# Patient Record
Sex: Female | Born: 1940 | Race: White | Hispanic: No | Marital: Married | Smoking: Former smoker
Health system: Southern US, Community
[De-identification: ages and names within clinical notes are randomized; demographics above are authoritative.]

## PROBLEM LIST (undated history)

## (undated) DIAGNOSIS — C801 Malignant (primary) neoplasm, unspecified: Secondary | ICD-10-CM

## (undated) DIAGNOSIS — J449 Chronic obstructive pulmonary disease, unspecified: Secondary | ICD-10-CM

## (undated) DIAGNOSIS — F32A Depression, unspecified: Secondary | ICD-10-CM

## (undated) DIAGNOSIS — I2510 Atherosclerotic heart disease of native coronary artery without angina pectoris: Secondary | ICD-10-CM

## (undated) DIAGNOSIS — K5790 Diverticulosis of intestine, part unspecified, without perforation or abscess without bleeding: Secondary | ICD-10-CM

## (undated) DIAGNOSIS — K219 Gastro-esophageal reflux disease without esophagitis: Secondary | ICD-10-CM

## (undated) DIAGNOSIS — F419 Anxiety disorder, unspecified: Secondary | ICD-10-CM

## (undated) DIAGNOSIS — F329 Major depressive disorder, single episode, unspecified: Secondary | ICD-10-CM

## (undated) DIAGNOSIS — K2270 Barrett's esophagus without dysplasia: Secondary | ICD-10-CM

## (undated) DIAGNOSIS — Z8489 Family history of other specified conditions: Secondary | ICD-10-CM

## (undated) DIAGNOSIS — N179 Acute kidney failure, unspecified: Secondary | ICD-10-CM

## (undated) DIAGNOSIS — I10 Essential (primary) hypertension: Secondary | ICD-10-CM

## (undated) DIAGNOSIS — N816 Rectocele: Secondary | ICD-10-CM

## (undated) DIAGNOSIS — E785 Hyperlipidemia, unspecified: Secondary | ICD-10-CM

## (undated) HISTORY — DX: Diverticulosis of intestine, part unspecified, without perforation or abscess without bleeding: K57.90

## (undated) HISTORY — PX: CATARACT EXTRACTION W/ INTRAOCULAR LENS  IMPLANT, BILATERAL: SHX1307

## (undated) HISTORY — PX: FLEXIBLE SIGMOIDOSCOPY: SHX5431

## (undated) HISTORY — PX: PARTIAL HYSTERECTOMY: SHX80

## (undated) HISTORY — PX: ABDOMINAL HYSTERECTOMY: SHX81

## (undated) HISTORY — DX: Barrett's esophagus without dysplasia: K22.70

## (undated) HISTORY — PX: CARDIAC CATHETERIZATION: SHX172

## (undated) HISTORY — DX: Rectocele: N81.6

## (undated) HISTORY — DX: Anxiety disorder, unspecified: F41.9

## (undated) HISTORY — DX: Hyperlipidemia, unspecified: E78.5

---

## 2007-11-25 ENCOUNTER — Ambulatory Visit: Payer: Self-pay | Admitting: Internal Medicine

## 2008-01-20 HISTORY — PX: ESOPHAGOGASTRODUODENOSCOPY: SHX1529

## 2008-01-20 HISTORY — PX: COLONOSCOPY: SHX174

## 2008-01-30 ENCOUNTER — Ambulatory Visit: Payer: Self-pay | Admitting: Gastroenterology

## 2008-01-30 ENCOUNTER — Ambulatory Visit (HOSPITAL_COMMUNITY): Admission: RE | Admit: 2008-01-30 | Discharge: 2008-01-30 | Payer: Self-pay | Admitting: Gastroenterology

## 2008-01-30 ENCOUNTER — Encounter: Payer: Self-pay | Admitting: Gastroenterology

## 2010-06-03 NOTE — Op Note (Signed)
Rhonda Hughes, Rhonda Hughes               ACCOUNT NO.:  [id number]   MEDICAL RECORD NO.:  [id number]          PATIENT TYPE:  AMB   LOCATION:  DAY                           FACILITY:  [facility]   PHYSICIAN:  Kassie Mends, M.D.      DATE OF BIRTH:  [birth year]   DATE OF PROCEDURE:  DATE OF DISCHARGE:                               OPERATIVE REPORT   REFERRING PHYSICIAN:  Mellody Dance A. Mayford Knife, MD, fax number [phone].   PROCEDURE:  1. Colonoscopy.  2. Esophagogastroduodenoscopy with cold forceps biopsy of the      esophagus and the gastric mucosa.   INDICATION FOR EXAM:  Rhonda Hughes is a 70 year old female who has history  of diverticulosis and presents for screening.  She also had a biopsy and  upper endoscopy which showed Barrett esophagus.   FINDINGS:  1. Multiple diverticula seen in the descending and sigmoid colon.      Otherwise, no polyps, masses, inflammatory changes, or AVMs seen.  2. Irregular Z-line with a few salmon-colored tongues that extends      from the Z-line.  The tongues were less than 1 cm.  Biopsies were      obtained.  3. Diffuse erythema in the body and the antrum with occasional erosion      showing active oozing.  Biopsies obtained via cold forceps to      evaluate for H. pylori gastritis.  4. Normal duodenal bulb and second portion of the duodenum.   DIAGNOSES:  1. Short-segment Barrett.  2. Moderate gastritis.  3. Moderate diverticulosis.   RECOMMENDATIONS:  1. Will call Rhonda Hughes with the results of her biopsies.  2. She has short-segment Barrett, and would recommend EGD for      surveillance in 5 years.  3. Screening colonoscopy in 10 years.  4. No aspirin or NSAIDs for 30 days.  No anticoagulation for 5 days.  5. She was given a handout on diverticulosis and a high-fiber diet as      well as gastritis.  6. Add Prilosec 20 mg daily.   MEDICATIONS:  1. Demerol 75 mg IV.  2. Versed 4 mg IV.  3. Phenergan 12.5 mg IV.   PROCEDURE TECHNIQUE:  Physical exam  was performed.  Informed consent was  obtained from the patient after explaining the benefits, risks, and  alternatives to the procedure.  The patient was connected to the monitor  and placed in the left lateral position.  Continuous oxygen was provided  by nasal cannula.  IV medicine administered through an indwelling  cannula.  After administration of sedation and rectal exam, the  patient's rectum was intubated, and the scope was advanced under direct  visualization to the cecum.  The scope was removed slowly by carefully  examining the color, texture, anatomy, and integrity of the mucosa on  the way out.  The patient was recovered in endoscopy and discharged home  after her upper endoscopy with complete.  After the colonoscopy, the  patient's esophagus was intubated with the diagnostic gastroscope, and  the scope was advanced under direct visualization to the second portion  of the duodenum.  The scope was removed slowly by carefully examining  the color, texture, anatomy, and integrity of the mucosa on the way out.  The patient was recovered in endoscopy and discharged home in  satisfactory condition.   PATH:  Minimal gastric inflammation. Barrett's esophaus.      Kassie Mends, M.D.  Electronically Signed     SM/MEDQ  D:  01/30/2008  T:  01/30/2008  Job:  [id number]   cc:   Mellody Dance A. Mayford Knife, MD

## 2010-06-06 NOTE — H&P (Signed)
NAME:  Rhonda Hughes, Rhonda Hughes               ACCOUNT NO.:  [id number]   MEDICAL RECORD NO.:  [id number]          PATIENT TYPE:  AMB   LOCATION:  DAY                           FACILITY:  [facility]   PHYSICIAN:  R. Roetta Sessions, M.D. DATE OF BIRTH:  November 27, 1940   DATE OF ADMISSION:  DATE OF DISCHARGE:  LH                              HISTORY & PHYSICAL   PRIMARY CARE PHYSICIAN:  Dr. Rance Muir in [place], [place].   CHIEF COMPLAINT:  Diverticulitis.   HISTORY OF PRESENT ILLNESS:  Rhonda Hughes is a 70 year old Caucasian  female.  Rhonda Hughes was diagnosed with diverticulitis about 2-1/2 to 3 months  ago.  Rhonda Hughes felt like Rhonda Hughes had a pulled muscle.  Rhonda Hughes was having lower  abdominal pain.  Rhonda Hughes was seen by her primary care physician and treated  with Cipro and Flagyl.  Rhonda Hughes tells me Rhonda Hughes took it for about 8 days and  became sick.  Pain was 10/10 at worst.  It seemed to clear up.  Rhonda Hughes then  again had a second flare around November 05, 2007 and Rhonda Hughes was restarted  on a 10-day course of Cipro and Flagyl again.  At that time Rhonda Hughes did have  severe weakness and lower bilateral quadrant pain.  Rhonda Hughes had nausea and  Rhonda Hughes had some diarrhea with about 6 to 8 loose stools per day.  Rhonda Hughes  denies any rectal bleeding, melena or mucus in her stools.  Denies any  fever or chills.  Rhonda Hughes does have occasional heartburn a couple times a  week.  Rhonda Hughes has started acidophilus for her diarrhea which does seem to  help.  At this point Rhonda Hughes denies any abdominal pain and her diarrhea has  checked up.   PAST MEDICAL AND SURGICAL HISTORY:  Hypercholesterolemia, hypertension,  anxiety.  Rhonda Hughes tells me Rhonda Hughes had a colonoscopy approximately 7 or more  years ago which Rhonda Hughes thinks was normal except for diverticulosis.  Rhonda Hughes  has had a partial hysterectomy.   CURRENT MEDICATIONS:  1. Lisinopril/hydrochlorothiazide 20/12.5 mg daily.  2. Venlafaxine 37.5 mg daily.  3. Alprazolam 0.25 mg p.r.n.  4. Simvastatin 40 mg daily.  5. Klor-Con 20 mEq daily.  6. Calcium 200  mg daily.  7. Vitamin D 2000 IU daily.  8. Multivitamin daily.  9. Acidophilus.   ALLERGIES:  SULFA.   FAMILY HISTORY:  Positive for her mother with a perforated colon of  unknown etiology.  Rhonda Hughes died at age 64.  Father deceased at 72 secondary  to coronary disease.  Brother and sister who are alive and healthy.  There is no known family history of colorectal carcinoma.   SOCIAL HISTORY:  Rhonda Hughes has been married for 4 years.  Rhonda Hughes has 1  healthy son.  Rhonda Hughes is employed with [employer] in [place] full  time.  Rhonda Hughes has a 58 pack-year history of tobacco use, quit smoking 8  months ago.  Denies any alcohol or drug use.   REVIEW OF SYSTEMS:  See HPI, otherwise negative.   PHYSICAL EXAMINATION:  VITAL SIGNS:  Weight 126 pounds, height 62  inches, temperature 98 degrees,  blood pressure 120/80, pulse 60.  GENERAL:  Rhonda Hughes is a well-developed, well-nourished Caucasian female in no  acute distress.  who is awake and alert, oriented, pleasant, and cooperative, in no acute  distress.  HEENT:  Pupils equal, round, and reactive to light.  Sclerae clear,  nonicteric.  Conjunctivae pink.  Oropharynx pink and moist without  lesions.  NECK:  Supple without evidence of mass or thyromegaly.  CHEST:  Heart regular rate and rhythm.  Normal S1, S2 without murmurs,  clicks, rubs, or gallops.  LUNGS:  Clear to auscultation bilaterally.  ABDOMEN:  Positive bowel sounds x4.  No bruits auscultated.  Soft.  Nontender, nondistended without palpable mass or hepatosplenomegaly.  No  rebound tenderness or guarding.  Rhonda Hughes has mild right upper quadrant,  right mid, and right lower quadrant abdominal tenderness.  EXTREMITIES:  Without clubbing or edema bilaterally.   LABORATORY STUDIES:  From [facility] November 07, 2007  white blood cell count was 11.4, hemoglobin 12.8, hematocrit 36.7,  platelets 306,000.   IMPRESSION:  Rhonda Hughes is a 70 year old Caucasian female with 2  episodes of  diverticulitis in the last 3 months.  Last colonoscopy was  over 7 years ago and, therefore, I do feel it would be appropriate to  proceed with colonoscopy to rule out occult malignancy.  Her  diverticulitis has not been confirmed with imaging either.  However at  this point Rhonda Hughes appears significantly and improved after her second  course of Cipro and Flagyl.   PLAN:  1. CBC, met7, and LFTs.  2. Begin Align probiotic therapy.  3. Would suggest complete ileal colonoscopy with Dr. Jena Gauss in the near      future in approximately 6 weeks to allow her diverticulitis to      resolve.  I discussed procedure including risks and benefits,      including but not limited to infection, perforation, drug reaction.      Consent will be obtained.  4. If Rhonda Hughes experiences any abdominal pain, nausea, vomiting, fever or      change in her bowel habits prior to a colonoscopy, Rhonda Hughes is going to      give [place] a call or go to the emergency room.  If Rhonda Hughes develops      abdominal pain, I suggest CT scan prior to colonoscopy.      Rhonda Hughes, N.P.      Rhonda Hughes, M.D.  Electronically Signed    KJ/MEDQ  D:  11/27/2007  T:  11/27/2007  Job:  [id number]   cc:   Dr. Winfred Burn, [place]

## 2010-06-06 NOTE — Consult Note (Signed)
NAMECAROLLE, ISHII               ACCOUNT NO.:  [id number]   MEDICAL RECORD NO.:  [id number]          PATIENT TYPE:  AMB   LOCATION:  DAY                           FACILITY:  [facility]   PHYSICIAN:  Kassie Mends, M.D.      DATE OF BIRTH:  1940/07/15   DATE OF CONSULTATION:  DATE OF DISCHARGE:                                 CONSULTATION   ADDENDUM   PRIMARY CARE PHYSICIAN:  Dr. Rance Muir in [place], [place].   Ms. Finnicum was initially evaluated by me on 11/25/2007 under the  direction of Dr. Jonathon Bellows.  However, she has stated that she  would like to have colonoscopy done by Dr. Kassie Mends.  On that day Dr.  Cira Servant was out of the office, therefore her future procedure will be  scheduled with her preference of physician.  We will place her under the  direction and care of Dr. Kassie Mends [place] from this point forward.      Lorenza Burton, N.P.      Kassie Mends, M.D.  Electronically Signed    KJ/MEDQ  D:  11/28/2007  T:  11/28/2007  Job:  [id number]

## 2010-06-18 ENCOUNTER — Ambulatory Visit: Payer: Self-pay | Admitting: Gastroenterology

## 2010-06-18 ENCOUNTER — Encounter: Payer: Self-pay | Admitting: Gastroenterology

## 2010-06-18 ENCOUNTER — Ambulatory Visit (INDEPENDENT_AMBULATORY_CARE_PROVIDER_SITE_OTHER): Payer: Medicare Other | Admitting: Gastroenterology

## 2010-06-18 DIAGNOSIS — R198 Other specified symptoms and signs involving the digestive system and abdomen: Secondary | ICD-10-CM

## 2010-06-18 DIAGNOSIS — R194 Change in bowel habit: Secondary | ICD-10-CM

## 2010-06-18 MED ORDER — HYDROCORTISONE ACETATE 25 MG RE SUPP: 25.0000 mg | Freq: Two times a day (BID) | RECTAL | Status: AC

## 2010-06-18 MED ORDER — ALIGN 4 MG PO CAPS: 4.0000 mg | ORAL_CAPSULE | Freq: Every day | ORAL | Status: DC

## 2010-06-18 NOTE — Patient Instructions (Signed)
Take Align for 4-6 weeks. #7 samples provided. Coupon provided. Buy over the counter. Take hydrocortisone suppository twice daily per rectum for 14 days. We will review labs from you PCP when available. Return to office in 6 weeks for follo-up.

## 2010-06-18 NOTE — Assessment & Plan Note (Signed)
Bowel change the last 3 months. Stools are more soft. No blood in the stool or frank diarrhea. She complains of rectal pressure but states that is improved. No alarm symptoms. Last colonoscopy about 2 years ago, diverticulosis. She states she had recent blood work with Dr. Mayford Knife for her physical. Will retrieve copy of blood work for review. Make sure her thyroid was checked. Will add probiotics and Anusol suppositories. She may have internal hemorrhoids. Return to the office in 6 weeks. At this point do not recommend repeat colonoscopy or flexible sigmoidoscopy but if symptoms are ongoing may consider flexible sigmoidoscopy with random biopsies if needed.

## 2010-06-18 NOTE — Progress Notes (Signed)
Cc to Dr. Mayford Knife

## 2010-06-18 NOTE — Progress Notes (Signed)
Primary Care Physician:  Rance Muir, MD  Primary Gastroenterologist:  Jonette Eva, MD  Chief Complaint  Patient presents with  . Bowel changes since feb.    HPI:  Rhonda Hughes is a 70 y.o. female here for further evaluation of change in bowels, rectal pressure which began about 3 months ago. Since February, new horrible odor when wiping after a BM. Some rectal tenesmus/pressure. BM a little more frequent due to decreased amount each time. No more than 2-3 BMs daily. No nocturnal BMs. Stools are soft to mushy. Used to be more solid. No melena, brbpr. No abdominal pain. No heartburn on omeprazole. No dysphagia, n/v, weight loss. At first more gas, but that is better.    Current Outpatient Prescriptions  Medication Sig Dispense Refill  . ALPRAZolam (XANAX) 0.25 MG tablet Take 0.25 mg by mouth at bedtime as needed.        Marland Kitchen atorvastatin (LIPITOR) 10 MG tablet Take 10 mg by mouth daily.        . Biotin 1000 MCG tablet Take 1,000 mcg by mouth 3 (three) times daily.        . Calcium Carbonate-Vitamin D (CALCIUM + D) 600-200 MG-UNIT TABS Take by mouth.        . Cyanocobalamin (VITAMIN B 12 PO) Take 2,000 mg by mouth.        . fish oil-omega-3 fatty acids 1000 MG capsule Take 2 g by mouth daily.        Marland Kitchen lisinopril-hydrochlorothiazide (PRINZIDE,ZESTORETIC) 20-12.5 MG per tablet Take 1 tablet by mouth daily.        . niacin (NIASPAN) 500 MG CR tablet Take 500 mg by mouth at bedtime.        Marland Kitchen omeprazole (PRILOSEC) 20 MG capsule Take 20 mg by mouth daily.        . potassium chloride SA (K-DUR,KLOR-CON) 20 MEQ tablet Take 20 mEq by mouth 2 (two) times daily.        Marland Kitchen venlafaxine (EFFEXOR-XR) 37.5 MG 24 hr capsule Take 37.5 mg by mouth daily.        . hydrocortisone (ANUSOL-HC) 25 MG suppository Place 1 suppository (25 mg total) rectally 2 (two) times daily.  24 suppository  0  . Probiotic Product (ALIGN) 4 MG CAPS Take 4 mg by mouth daily.  7 capsule  0    Allergies as of 06/18/2010 - Review  Complete 06/18/2010  Allergen Reaction Noted  . Sulfa antibiotics  06/18/2010    Past Medical History  Diagnosis Date  . Hyperlipidemia   . HTN (hypertension)   . Anxiety   . Diverticulosis   . Barrett's esophagus without dysplasia     Past Surgical History  Procedure Date  . Partial hysterectomy   . Colonoscopy 01/2008    multiple diverticula in desc and sigm colon  . Esophagogastroduodenoscopy 01/2008    Barrett's esophagus, gastritis, no h.pylori, due follow-up 01/2011    Family History  Problem Relation Age of Onset  . Coronary artery disease Father   . Colon cancer Neg Hx   . GI problems Mother     perforated colon     History   Social History  . Marital Status: Married    Spouse Name: N/A    Number of Children: 1  . Years of Education: N/A   Occupational History  . [employer] in [place]     retired   Social History Main Topics  . Smoking status: Former Smoker -- 1.0 packs/day for 58 years  Types: Cigarettes  . Smokeless tobacco: Former Neurosurgeon    Quit date: 04/16/2007  . Alcohol Use: No  . Drug Use: No  . Sexually Active: Not on file     ROS:  General: Negative for anorexia, weight loss, fever, chills, fatigue, weakness. Eyes: Negative for vision changes.  ENT: Negative for hoarseness, difficulty swallowing , nasal congestion. CV: Negative for chest pain, angina, palpitations, dyspnea on exertion, peripheral edema.  Respiratory: Negative for dyspnea at rest, dyspnea on exertion, cough, sputum, wheezing.  GI: See history of present illness. GU:  Negative for dysuria, hematuria, urinary incontinence, urinary frequency, nocturnal urination.  MS: Negative for joint pain, low back pain.  Derm: Negative for rash or itching.  Neuro: Negative for weakness, abnormal sensation, seizure, frequent headaches, memory loss, confusion.  Psych: Negative for anxiety, depression, suicidal ideation, hallucinations.  Endo: Negative for unusual weight change.  Heme:  Negative for bruising or bleeding. Allergy: Negative for rash or hives.    Physical Examination:  BP 118/64  Pulse 72  Temp(Src) 97.7 F (36.5 C) (Temporal)  Ht 5\' 2"  (1.575 m)  Wt 131 lb 6.4 oz (59.603 kg)  BMI 24.03 kg/m2   General: Well-nourished, well-developed in no acute distress.  Head: Normocephalic, atraumatic.   Eyes: Conjunctiva pink, no icterus. Mouth: Oropharyngeal mucosa moist and pink , no lesions erythema or exudate. Neck: Supple without thyromegaly, masses, or lymphadenopathy.  Lungs: Clear to auscultation bilaterally.  Heart: Regular rate and rhythm, no murmurs rubs or gallops.  Abdomen: Bowel sounds are normal, nontender, nondistended, no hepatosplenomegaly or masses, no abdominal bruits or    hernia , no rebound or guarding.   Rectal exam: Small skin tag at 12 o'clock. No masses in rectal vault. Brown stool heme negative. Nontender.  Extremities: No lower extremity edema.  Neuro: Alert and oriented x 4 , grossly normal neurologically.  Skin: Warm and dry, no rash or jaundice.   Psych: Alert and cooperative, normal mood and affect.

## 2010-06-20 NOTE — Progress Notes (Signed)
Addended by: Tiffany Kocher on: 06/20/2010 12:54 PM   Modules accepted: Level of Service

## 2010-06-20 NOTE — Progress Notes (Signed)
Received labs from Dr. Mayford Knife from March 2012. At that time her white blood cell count was 8000, hemoglobin 12.6, platelets 274,000, creatinine 1.0, albumin 4.2, and bilirubin 0.8, alkaline phosphatase 68, AST 23, ALT 21, no TSH done.  Will have patient have TSH and free T4 drawn for diagnosis of bowel change/diarrhea.

## 2010-06-23 ENCOUNTER — Other Ambulatory Visit: Payer: Self-pay | Admitting: Gastroenterology

## 2010-06-23 ENCOUNTER — Other Ambulatory Visit: Payer: Self-pay

## 2010-06-23 DIAGNOSIS — R194 Change in bowel habit: Secondary | ICD-10-CM

## 2010-06-23 DIAGNOSIS — R197 Diarrhea, unspecified: Secondary | ICD-10-CM

## 2010-06-23 LAB — BASIC METABOLIC PANEL
AST: 23 U/L
Creat: 1

## 2010-06-23 LAB — CBC
HCT: 36 %
Hemoglobin: 12.8 g/dL (ref 12.0–16.0)

## 2010-06-23 NOTE — Progress Notes (Signed)
LMOM for pt to call. 

## 2010-06-23 NOTE — Progress Notes (Signed)
Pt returned call and was informed to go to the lab for lab work.

## 2010-06-23 NOTE — Progress Notes (Signed)
Lab orders faxed to [facility].  

## 2010-06-24 LAB — T4, FREE: Free T4: 0.94 ng/dL (ref 0.80–1.80)

## 2010-07-03 NOTE — Progress Notes (Signed)
Agree w/ plan. Flex sig if Sx persist.

## 2010-07-30 ENCOUNTER — Encounter: Payer: Self-pay | Admitting: Gastroenterology

## 2010-07-30 ENCOUNTER — Ambulatory Visit (INDEPENDENT_AMBULATORY_CARE_PROVIDER_SITE_OTHER): Payer: Medicare Other | Admitting: Gastroenterology

## 2010-07-30 VITALS — BP 113/65 | HR 67 | Temp 97.7°F | Ht 62.0 in | Wt 131.4 lb

## 2010-07-30 DIAGNOSIS — R198 Other specified symptoms and signs involving the digestive system and abdomen: Secondary | ICD-10-CM

## 2010-07-30 NOTE — Assessment & Plan Note (Signed)
Rectal pressure and discomfort associated with change in bowel habits. Stools are incomplete, more frequent, smaller caliber. She is having difficulty with frequent tenesmus as well as difficulty evacuating stool. Recommend flexible sigmoidoscopy with sedation for ongoing symptoms.  I have discussed the risks, alternatives, benefits with regards to but not limited to the risk of reaction to medication, bleeding, infection, perforation and the patient is agreeable to proceed. Written consent to be obtained.

## 2010-07-30 NOTE — Progress Notes (Signed)
Faxed to Dr. Rance Muir

## 2010-07-30 NOTE — Progress Notes (Signed)
Primary Care Physician:  Provider Not In System  Primary Gastroenterologist:  Jonette Eva, MD  Chief Complaint  Patient presents with  . Follow-up    still having same problems    HPI:  Rhonda Hughes is a 70 y.o. female here followup for rectal pressure. Tried probiotics and Anusol after last office visit without relief. Symptoms now persistent for 5-6 months. Since February, new horrible odor when wiping after a BM. Some rectal tenesmus/pressure. BM a little more frequent due to decreased amount each time. No more than 2-3 BMs daily. No nocturnal BMs. Stools are soft to mushy. Used to be more solid. No melena, brbpr. Stools are soft but hard to pass. Every time she goes to urinate she gets rectal pressure and discomfort. No history of cystocele or rectocele to her knowledge. Describes difficulty with complete evacuation. Stool caliber smaller. No abdominal pain. No heartburn on omeprazole. No dysphagia, n/v, weight loss. At first more gas, but that is better.    Current Outpatient Prescriptions  Medication Sig Dispense Refill  . ALPRAZolam (XANAX) 0.25 MG tablet Take 0.25 mg by mouth at bedtime as needed.        Marland Kitchen atorvastatin (LIPITOR) 10 MG tablet Take 10 mg by mouth daily.        . Biotin 1000 MCG tablet Take 1,000 mcg by mouth 3 (three) times daily.        . Calcium Carbonate-Vitamin D (CALCIUM + D) 600-200 MG-UNIT TABS Take by mouth.        . Cyanocobalamin (VITAMIN B 12 PO) Take 2,000 mg by mouth.        . fish oil-omega-3 fatty acids 1000 MG capsule Take 2 g by mouth daily.        Marland Kitchen lisinopril-hydrochlorothiazide (PRINZIDE,ZESTORETIC) 20-12.5 MG per tablet Take 1 tablet by mouth daily.        . niacin (NIASPAN) 500 MG CR tablet Take 500 mg by mouth at bedtime.        Marland Kitchen omeprazole (PRILOSEC) 20 MG capsule Take 20 mg by mouth daily.        . potassium chloride SA (K-DUR,KLOR-CON) 20 MEQ tablet Take 20 mEq by mouth 2 (two) times daily.        . Probiotic Product (ALIGN) 4 MG CAPS Take 4  mg by mouth daily.  7 capsule  0  . venlafaxine (EFFEXOR-XR) 37.5 MG 24 hr capsule Take 37.5 mg by mouth daily.          Allergies as of 07/30/2010 - Review Complete 07/30/2010  Allergen Reaction Noted  . Sulfa antibiotics  06/18/2010    Past Medical History  Diagnosis Date  . Hyperlipidemia   . HTN (hypertension)   . Anxiety   . Diverticulosis   . Barrett's esophagus without dysplasia     Past Surgical History  Procedure Date  . Partial hysterectomy 1980s  . Colonoscopy 01/2008    multiple diverticula in desc and sigm colon  . Esophagogastroduodenoscopy 01/2008    Barrett's esophagus, gastritis, no h.pylori, due follow-up 01/2011    Family History  Problem Relation Age of Onset  . Coronary artery disease Father   . Colon cancer Neg Hx   . GI problems Mother     perforated colon     History   Social History  . Marital Status: Married    Spouse Name: N/A    Number of Children: 1  . Years of Education: N/A   Occupational History  . [employer] in [place]  retired   Social History Main Topics  . Smoking status: Former Smoker -- 1.0 packs/day for 58 years    Types: Cigarettes  . Smokeless tobacco: Former Neurosurgeon    Quit date: 04/16/2007  . Alcohol Use: No  . Drug Use: No  . Sexually Active: Not on file   Other Topics Concern  . Not on file   Social History Narrative  . No narrative on file      ROS:  General: Negative for anorexia, weight loss, fever, chills, fatigue, weakness. Eyes: Negative for vision changes.  ENT: Negative for hoarseness, difficulty swallowing , nasal congestion. CV: Negative for chest pain, angina, palpitations, dyspnea on exertion, peripheral edema.  Respiratory: Negative for dyspnea at rest, dyspnea on exertion, cough, sputum, wheezing.  GI: See history of present illness. GU:  Negative for dysuria, hematuria, urinary incontinence, urinary frequency, nocturnal urination.  MS: Negative for joint pain, low back pain.  Derm:  Negative for rash or itching.  Neuro: Negative for weakness, abnormal sensation, seizure, frequent headaches, memory loss, confusion.  Psych: Negative for anxiety, depression, suicidal ideation, hallucinations.  Endo: Negative for unusual weight change.  Heme: Negative for bruising or bleeding. Allergy: Negative for rash or hives.    Physical Examination:  BP 113/65  Pulse 67  Temp(Src) 97.7 F (36.5 C) (Temporal)  Ht 5\' 2"  (1.575 m)  Wt 131 lb 6.4 oz (59.603 kg)  BMI 24.03 kg/m2   General: Well-nourished, well-developed in no acute distress.  Head: Normocephalic, atraumatic.   Eyes: Conjunctiva pink, no icterus. Mouth: Oropharyngeal mucosa moist and pink , no lesions erythema or exudate. Neck: Supple without thyromegaly, masses, or lymphadenopathy.  Lungs: Clear to auscultation bilaterally.  Heart: Regular rate and rhythm, no murmurs rubs or gallops.  Abdomen: Bowel sounds are normal, nontender, nondistended, no hepatosplenomegaly or masses, no abdominal bruits or    hernia , no rebound or guarding.  Rectal exam: Deferred to time of colonoscopy  Extremities: No lower extremity edema.  Neuro: Alert and oriented x 4 , grossly normal neurologically.  Skin: Warm and dry, no rash or jaundice.   Psych: Alert and cooperative, normal mood and affect.

## 2010-08-08 MED ORDER — SODIUM CHLORIDE 0.45 % IV SOLN
Freq: Once | INTRAVENOUS | Status: AC
  Administered 2010-08-11: 11:00:00 via INTRAVENOUS

## 2010-08-11 ENCOUNTER — Other Ambulatory Visit: Payer: PRIVATE HEALTH INSURANCE | Admitting: Gastroenterology

## 2010-08-11 ENCOUNTER — Ambulatory Visit (HOSPITAL_COMMUNITY)
Admission: RE | Admit: 2010-08-11 | Discharge: 2010-08-11 | Disposition: A | Discharge status: Home / Self Care | Payer: Medicare Other | Source: Ambulatory Visit | Attending: Gastroenterology | Admitting: Gastroenterology

## 2010-08-11 ENCOUNTER — Encounter (HOSPITAL_COMMUNITY): Payer: Self-pay | Admitting: *Deleted

## 2010-08-11 ENCOUNTER — Encounter (HOSPITAL_COMMUNITY)
Admission: RE | Disposition: A | Discharge status: Home / Self Care | Payer: Self-pay | Source: Ambulatory Visit | Attending: Gastroenterology

## 2010-08-11 DIAGNOSIS — K6289 Other specified diseases of anus and rectum: Secondary | ICD-10-CM | POA: Insufficient documentation

## 2010-08-11 DIAGNOSIS — K648 Other hemorrhoids: Secondary | ICD-10-CM

## 2010-08-11 DIAGNOSIS — E785 Hyperlipidemia, unspecified: Secondary | ICD-10-CM | POA: Insufficient documentation

## 2010-08-11 DIAGNOSIS — K5730 Diverticulosis of large intestine without perforation or abscess without bleeding: Secondary | ICD-10-CM

## 2010-08-11 DIAGNOSIS — I10 Essential (primary) hypertension: Secondary | ICD-10-CM | POA: Insufficient documentation

## 2010-08-11 DIAGNOSIS — Z79899 Other long term (current) drug therapy: Secondary | ICD-10-CM | POA: Insufficient documentation

## 2010-08-11 HISTORY — DX: Major depressive disorder, single episode, unspecified: F32.9

## 2010-08-11 HISTORY — DX: Depression, unspecified: F32.A

## 2010-08-11 SURGERY — SIGMOIDOSCOPY, FLEXIBLE
Anesthesia: Moderate Sedation

## 2010-08-11 MED ORDER — HYDROCORTISONE ACETATE 25 MG RE SUPP: 25.0000 mg | Freq: Two times a day (BID) | RECTAL | Status: AC

## 2010-08-11 MED ORDER — MEPERIDINE HCL 100 MG/ML IJ SOLN
INTRAMUSCULAR | Status: AC
  Filled 2010-08-11: qty 2

## 2010-08-11 MED ORDER — PROMETHAZINE HCL 25 MG/ML IJ SOLN
INTRAMUSCULAR | Status: DC | PRN
  Administered 2010-08-11: 12.5 mg via INTRAVENOUS

## 2010-08-11 MED ORDER — MIDAZOLAM HCL 5 MG/5ML IJ SOLN
INTRAMUSCULAR | Status: DC | PRN
  Administered 2010-08-11: 2 mg via INTRAVENOUS

## 2010-08-11 MED ORDER — PROMETHAZINE HCL 25 MG/ML IJ SOLN
INTRAMUSCULAR | Status: AC
  Filled 2010-08-11: qty 1

## 2010-08-11 MED ORDER — MIDAZOLAM HCL 5 MG/5ML IJ SOLN
INTRAMUSCULAR | Status: AC
  Filled 2010-08-11: qty 10

## 2010-08-11 MED ORDER — MEPERIDINE HCL 100 MG/ML IJ SOLN
INTRAMUSCULAR | Status: DC | PRN
  Administered 2010-08-11: 50 mg via INTRAVENOUS

## 2010-08-11 NOTE — H&P (Signed)
Rectal pressure   - Primary    787.99      Reason for Visit     Follow-up    still having same problems        Current Vitals       Recorded User        07/30/2010 10:14 AM  Evalee Mutton, LPN           BP Pulse Temp (Src) Resp Ht Wt    113/65  67  97.7 F (36.5 C) (Temporal)  N/A  5\' 2"  (1.575 m)  59.603 kg (131 lb 6.4 oz)       BMI SpO2 PF LMP    24.03 kg/m2  N/A  N/A  N/A          Progress Notes     Tana Coast, PA  07/30/2010 11:29 AM  Signed Primary Care Physician:  Provider Not In System   Primary Gastroenterologist:  Jonette Eva, MD    Chief Complaint   Patient presents with   .  Follow-up       still having same problems      HPI:  Rhonda Hughes is a 70 y.o. female here followup for rectal pressure. Tried probiotics and Anusol after last office visit without relief. Symptoms now persistent for 5-6 months. Since February, new horrible odor when wiping after a BM. Some rectal tenesmus/pressure. BM a little more frequent due to decreased amount each time. No more than 2-3 BMs daily. No nocturnal BMs. Stools are soft to mushy. Used to be more solid. No melena, brbpr. Stools are soft but hard to pass. Every time she goes to urinate she gets rectal pressure and discomfort. No history of cystocele or rectocele to her knowledge. Describes difficulty with complete evacuation. Stool caliber smaller. No abdominal pain. No heartburn on omeprazole. No dysphagia, n/v, weight loss. At first more gas, but that is better.      Current Outpatient Prescriptions   Medication  Sig  Dispense  Refill   .  ALPRAZolam (XANAX) 0.25 MG tablet  Take 0.25 mg by mouth at bedtime as needed.           Marland Kitchen  atorvastatin (LIPITOR) 10 MG tablet  Take 10 mg by mouth daily.           .  Biotin 1000 MCG tablet  Take 1,000 mcg by mouth 3 (three) times daily.           .  Calcium Carbonate-Vitamin D (CALCIUM + D) 600-200 MG-UNIT TABS  Take by mouth.           .  Cyanocobalamin (VITAMIN B 12 PO)   Take 2,000 mg by mouth.           .  fish oil-omega-3 fatty acids 1000 MG capsule  Take 2 g by mouth daily.           Marland Kitchen  lisinopril-hydrochlorothiazide (PRINZIDE,ZESTORETIC) 20-12.5 MG per tablet  Take 1 tablet by mouth daily.           .  niacin (NIASPAN) 500 MG CR tablet  Take 500 mg by mouth at bedtime.           Marland Kitchen  omeprazole (PRILOSEC) 20 MG capsule  Take 20 mg by mouth daily.           .  potassium chloride SA (K-DUR,KLOR-CON) 20 MEQ tablet  Take 20 mEq by mouth 2 (two) times daily.           Marland Kitchen  Probiotic Product (ALIGN) 4 MG CAPS  Take 4 mg by mouth daily.   7 capsule   0   .  venlafaxine (EFFEXOR-XR) 37.5 MG 24 hr capsule  Take 37.5 mg by mouth daily.               Allergies as of 07/30/2010 - Review Complete 07/30/2010   Allergen  Reaction  Noted   .  Sulfa antibiotics    06/18/2010       Past Medical History   Diagnosis  Date   .  Hyperlipidemia     .  HTN (hypertension)     .  Anxiety     .  Diverticulosis     .  Barrett's esophagus without dysplasia         Past Surgical History   Procedure  Date   .  Partial hysterectomy  1980s   .  Colonoscopy  01/2008       multiple diverticula in desc and sigm colon   .  Esophagogastroduodenoscopy  01/2008       Barrett's esophagus, gastritis, no h.pylori, due follow-up 01/2011       Family History   Problem  Relation  Age of Onset   .  Coronary artery disease  Father     .  Colon cancer  Neg Hx     .  GI problems  Mother         perforated colon        History       Social History   .  Marital Status:  Married       Spouse Name:  N/A       Number of Children:  1   .  Years of Education:  N/A       Occupational History   .  [employer] in [place]         retired       Social History Main Topics   .  Smoking status:  Former Smoker -- 1.0 packs/day for 58 years       Types:  Cigarettes   .  Smokeless tobacco:  Former Neurosurgeon       Quit date:  04/16/2007   .  Alcohol Use:  No   .  Drug Use:  No   .  Sexually  Active:  Not on file       Other Topics  Concern   .  Not on file       Social History Narrative   .  No narrative on file        ROS:   General: Negative for anorexia, weight loss, fever, chills, fatigue, weakness. Eyes: Negative for vision changes.   ENT: Negative for hoarseness, difficulty swallowing , nasal congestion. CV: Negative for chest pain, angina, palpitations, dyspnea on exertion, peripheral edema.   Respiratory: Negative for dyspnea at rest, dyspnea on exertion, cough, sputum, wheezing.   GI: See history of present illness. GU:  Negative for dysuria, hematuria, urinary incontinence, urinary frequency, nocturnal urination.   MS: Negative for joint pain, low back pain.   Derm: Negative for rash or itching.   Neuro: Negative for weakness, abnormal sensation, seizure, frequent headaches, memory loss, confusion.   Psych: Negative for anxiety, depression, suicidal ideation, hallucinations.   Endo: Negative for unusual weight change.   Heme: Negative for bruising or bleeding. Allergy: Negative for rash or hives.     Physical Examination:   BP 113/65  Pulse 67  Temp(Src) 97.7 F (36.5 C) (Temporal)  Ht 5\' 2"  (1.575 m)  Wt 131 lb 6.4 oz (59.603 kg)  BMI 24.03 kg/m2    General: Well-nourished, well-developed in no acute distress.   Head: Normocephalic, atraumatic.    Eyes: Conjunctiva pink, no icterus. Mouth: Oropharyngeal mucosa moist and pink , no lesions erythema or exudate. Neck: Supple without thyromegaly, masses, or lymphadenopathy.   Lungs: Clear to auscultation bilaterally.   Heart: Regular rate and rhythm, no murmurs rubs or gallops.   Abdomen: Bowel sounds are normal, nontender, nondistended, no hepatosplenomegaly or masses, no abdominal bruits or    hernia , no rebound or guarding.   Rectal exam: Deferred to time of colonoscopy   Extremities: No lower extremity edema.   Neuro: Alert and oriented x 4 , grossly normal neurologically.   Skin: Warm  and dry, no rash or jaundice.    Psych: Alert and cooperative, normal mood and affect.          Glendora Score  07/30/2010  2:27 PM  Signed Faxed to Dr. Rance Muir          Rectal pressure - Tana Coast, PA  07/30/2010 11:13 AM  Signed Rectal pressure and discomfort associated with change in bowel habits. Stools are incomplete, more frequent, smaller caliber. She is having difficulty with frequent tenesmus as well as difficulty evacuating stool. Recommend flexible sigmoidoscopy with sedation for ongoing symptoms.  I have discussed the risks, alternatives, benefits with regards to but not limited to the risk of reaction to medication, bleeding, infection, perforation and the patient is agreeable to proceed. Written consent to be obtained.

## 2010-08-11 NOTE — Interval H&P Note (Signed)
History and Physical Interval Note:   08/11/2010   10:56 AM   Tracie Harrier  has presented today for surgery, with the diagnosis of rectal pain and pressure  The various methods of treatment have been discussed with the patient and family. After consideration of risks, benefits and other options for treatment, the patient has consented to  Procedure(s): FLEXIBLE SIGMOIDOSCOPY as a surgical intervention .  I have reviewed the patients' chart and labs.  Questions were answered to the patient's satisfaction.     Jonette Eva  MD

## 2010-08-14 NOTE — Progress Notes (Signed)
FS complete, int hem referred to GYN

## 2010-08-15 ENCOUNTER — Encounter: Payer: Self-pay | Admitting: Gastroenterology

## 2010-08-19 ENCOUNTER — Encounter (HOSPITAL_COMMUNITY): Payer: Self-pay | Admitting: Gastroenterology

## 2010-11-05 ENCOUNTER — Ambulatory Visit: Payer: Medicare Other | Admitting: Gastroenterology

## 2010-11-05 ENCOUNTER — Telehealth: Payer: Self-pay | Admitting: Gastroenterology

## 2010-11-05 NOTE — Telephone Encounter (Signed)
Pt was a no show

## 2010-11-12 ENCOUNTER — Ambulatory Visit: Payer: Medicare Other | Admitting: Gastroenterology

## 2010-11-18 NOTE — Telephone Encounter (Signed)
noted 

## 2011-01-01 ENCOUNTER — Encounter: Payer: Self-pay | Admitting: Gastroenterology

## 2011-01-06 ENCOUNTER — Telehealth: Payer: Self-pay | Admitting: Gastroenterology

## 2011-01-06 NOTE — Telephone Encounter (Signed)
REVIEWED.  

## 2011-01-06 NOTE — Telephone Encounter (Signed)
Pt called to say she received a letter stating it was time for her to schedule an EGD. She said she was feeling fine and didn't feel the need to have it done at this time.

## 2011-01-06 NOTE — Telephone Encounter (Signed)
PT NEEDED EGD JAN 2013 FOR BARRETT'S SURVEILLANCE.

## 2011-02-03 ENCOUNTER — Encounter: Payer: Self-pay | Admitting: Gastroenterology

## 2011-02-03 NOTE — Telephone Encounter (Signed)
Pt is aware of OV on 1/30 at 230 pm with SF and appt card was mailed

## 2011-02-09 ENCOUNTER — Telehealth: Payer: Self-pay | Admitting: Gastroenterology

## 2011-02-09 NOTE — Telephone Encounter (Signed)
I have tried several times to explain to patient that SF wanted her to have a FU OV to re-evaluate Barrett's. Pt said she is feeling fine and does not want to make OV. I told her that I would let SF know and she what she advises. You can reach patient at [phone]

## 2011-02-11 NOTE — Telephone Encounter (Signed)
REVIEWED.  PT AWARE SHE NEEDS AN EGD BUT DECLINES. PT CAN CONTACT [facility] FOR APPT AT HER CONVENIENCE. CONVENIENCE.

## 2011-02-11 NOTE — Telephone Encounter (Signed)
LMOM that Dr. Darrick Penna is aware she did not want to schedule now and to call as needed.

## 2011-02-18 ENCOUNTER — Ambulatory Visit: Payer: Medicare Other | Admitting: Gastroenterology

## 2011-02-25 ENCOUNTER — Encounter: Payer: Self-pay | Admitting: Gastroenterology

## 2011-02-25 ENCOUNTER — Ambulatory Visit (INDEPENDENT_AMBULATORY_CARE_PROVIDER_SITE_OTHER): Payer: Medicare Other | Admitting: Gastroenterology

## 2011-02-25 VITALS — BP 111/61 | HR 74 | Temp 97.6°F | Ht 61.0 in | Wt 134.2 lb

## 2011-02-25 DIAGNOSIS — K2270 Barrett's esophagus without dysplasia: Secondary | ICD-10-CM | POA: Insufficient documentation

## 2011-02-25 NOTE — Progress Notes (Signed)
Faxed to PCP

## 2011-02-25 NOTE — Patient Instructions (Signed)
YOU HAVE SHORT SEGMENT BARRETT'S ESOPHAGUS.  YOU NEED AN UPPER ENDOSCOPY EVERY 3 YEARS IF WE DO NOT DETECT ANY ADVANCED CHANGES. CONTINUE THE PRILOSEC FOREVER. FOLLOW UP EVERY 1-2 YEARS.  Barrett's Esophagus The esophagus is the muscular tube that carries food and saliva from the mouth to the stomach. Barrett's esophagus involves changes in the esophagus. Some of its lining is replaced by a type of tissue similar to that found in the intestine. This process is called intestinal metaplasia.   While Barrett's esophagus may cause no symptoms itself, a small number of people with this condition develop a relatively rare but often deadly type of cancer of the esophagus. It is called esophageal adenocarcinoma. Barrett's esophagus is associated with the common condition called GERD (gastroesophageal reflux disease).  HOW THE ESOPHAGUS WORKS The esophagus carries food, liquids, and saliva from the mouth to the stomach. The stomach acts as a container to start digestion and pump food and liquids into the intestines in a controlled process. Food can then be properly digested over time. Nutrients can be taken in (absorbed) by the intestines. The esophagus moves food to the stomach by coordinated contractions of its muscular lining. This process is automatic. People are usually not aware of it. Many people have felt their esophagus when they:  Swallow something too large.   Try to eat too quickly.   Drink very hot or cold liquids.  They then feel the movement of the food or drink down the esophagus into the stomach. This may be an uncomfortable feeling.  GERD (GASTROESOPHAGEAL REFLUX DISEASE) Having some stomach contents (liquids or gas) sometimes reflux (come back up from the stomach into the esophagus) is considered normal. When it happens often, and causes other symptoms, it is considered a medical problem or disease. However, it is not necessarily a serious one or one that requires seeing a  caregiver. The stomach produces acid and enzymes to digest food. When this mixture refluxes into the esophagus more often than normal or for a longer period of time than normal, it may produce symptoms. These symptoms are often called acid reflux. They are often described by people as heartburn, indigestion, or "gas". The symptoms often consist of a burning sensation below and behind the lower part of the breastbone or sternum. Almost everyone has experienced these symptoms at least once. This is typically a result of overeating. Other things that provoke GERD symptoms include:  Being overweight.   Eating certain types of foods.   Being pregnant.  In most people, GERD symptoms may last only a short time and require no treatment at all.  More continual symptoms are often quickly relieved by over-the-counter acid-reducing agents, such as antacids.  Overall, GERD is one of the most common medical conditions. About 20 percent of the population can be affected over a lifetime.  GERD AND BARRETT'S ESOPHAGUS The exact causes of Barrett's esophagus are not known. It is thought to be caused in part by the same factors that cause GERD. People who do not have heartburn can have Barrett's esophagus. However, it is found about 3 to 5 times more often in people with this condition. Barrett's esophagus is uncommon in children. The average age at diagnosis is 51. But it is usually difficult to know when the problem started. It is about twice as common in men as in women. It is much more common in white men than in men of other races.  DIAGNOSIS Diagnosing Barrett's esophagus is not easy. At the present  time it cannot be diagnosed based on symptoms, physical exam, or blood tests. The only useful test is upper gastrointestinal endoscopy and biopsy. In this procedure, a flexible tube called an endoscope is used. A biopsy is the removal of a small piece of tissue. This is done using a pincher-like device passed through  the endoscope. A pathologist is a specialist who examines body tissue samples. He or she examines the tissue under a microscope to confirm the diagnosis.  Many caregivers advise that adult patients who are over the age of 39 and have had GERD symptoms for a number of years have endoscopy, to see whether they have Barrett's esophagus.   TREATMENT  Barrett's esophagus has no cure, other than surgical removal of the esophagus. This is a serious operation. Surgery is advised only for people who have a high risk of developing cancer or who already have it. Most caregivers recommend treating GERD with acid-blocking drugs. This is sometimes linked to improvement in the extent of the Barrett's tissue. But this approach has not been proven to reduce the risk of cancer. Treating reflux with surgery for GERD also does not seem to cure Barrett's esophagus. Several experimental approaches are under study. One attempts to see whether destroying the Barrett's tissue by heat or other means, through an endoscope, can get rid of the condition.    IMPORTANT POINTS TO REMEMBER  In Barrett's esophagus, the cells lining the esophagus change. They become similar to the cells lining the intestine.   Barrett's esophagus is connected with gastroesophageal reflux disease or GERD.   A small number of people with Barrett's esophagus may develop esophageal cancer.   Barrett's esophagus is diagnosed by upper gastrointestinal endoscopy and biopsy.   People who have Barrett's esophagus should have periodic esophageal exams.   Taking acid-blocking drugs for GERD may help improve Barrett's esophagus.

## 2011-02-25 NOTE — Progress Notes (Signed)
Subjective:    Patient ID: Rhonda Hughes, female    DOB: August 28, 1940, 71 y.o.   MRN: [id number]  PCP: NIS  HPI LAST SEEN JAN 2012: EGD-BARRETT'S NO DYSPLASIA. HAs questions about why she needs another upper endoscopy. The patient denies abdominal or flank pain, anorexia, problems swallowing, nausea or vomiting, dysphagia, change in bowel habits or black or bloody stools or weight loss. BMs: nl.  Past Medical History  Diagnosis Date  . Hyperlipidemia   . HTN (hypertension)   . Anxiety   . Diverticulosis   . Barrett's esophagus without dysplasia   . Depression     Past Surgical History  Procedure Date  . Partial hysterectomy 1980s  . Colonoscopy 01/2008    multiple diverticula in desc and sigm colon  . Esophagogastroduodenoscopy 01/2008    Barrett's esophagus, gastritis, no h.pylori, due follow-up 01/2011  . Flexible sigmoidoscopy 08/11/2010    Procedure: FLEXIBLE SIGMOIDOSCOPY;  Surgeon: Arlyce Harman, MD;  Location: [facility];  Service: Endoscopy;  Laterality: N/A;    Allergies  Allergen Reactions  . Sulfa Antibiotics     Current Outpatient Prescriptions  Medication Sig Dispense Refill  . ALPRAZolam (XANAX) 0.25 MG tablet Take 0.25 mg by mouth at bedtime as needed.        Marland Kitchen atorvastatin (LIPITOR) 10 MG tablet Take 10 mg by mouth daily.        . Biotin 1000 MCG tablet Take 1,000 mcg by mouth 3 (three) times daily.        . Calcium Carbonate-Vitamin D (CALCIUM + D) 600-200 MG-UNIT TABS Take by mouth.        . Cyanocobalamin (VITAMIN B 12 PO) Take 2,000 mg by mouth.        . fish oil-omega-3 fatty acids 1000 MG capsule Take 2 g by mouth daily.        Marland Kitchen lisinopril-hydrochlorothiazide (PRINZIDE,ZESTORETIC) 20-12.5 MG per tablet Take 1 tablet by mouth daily.        . niacin (NIASPAN) 500 MG CR tablet Take 500 mg by mouth at bedtime.        Marland Kitchen omeprazole (PRILOSEC) 20 MG capsule Take 20 mg by mouth daily.        . potassium chloride SA (K-DUR,KLOR-CON) 20 MEQ tablet Take 20 mEq  by mouth 2 (two) times daily.        Marland Kitchen venlafaxine (EFFEXOR-XR) 37.5 MG 24 hr capsule Take 37.5 mg by mouth daily.        .      .       Family History  Problem Relation Age of Onset  . Coronary artery disease Father   . Colon cancer Neg Hx   . GI problems Mother     perforated colon          Review of Systems     Objective:   Physical Exam  Vitals reviewed. Constitutional: She is oriented to person, place, and time. She appears well-developed and well-nourished. No distress.  HENT:  Head: Normocephalic and atraumatic.  Mouth/Throat: Oropharynx is clear and moist. No oropharyngeal exudate.  Eyes: Pupils are equal, round, and reactive to light. No scleral icterus.  Neck: Normal range of motion. Neck supple.  Cardiovascular: Normal rate, regular rhythm and normal heart sounds.   Pulmonary/Chest: Effort normal and breath sounds normal. No respiratory distress.  Abdominal: Soft. Bowel sounds are normal. She exhibits no distension. There is no tenderness.  Musculoskeletal: Normal range of motion. She exhibits no edema.  Lymphadenopathy:  She has no cervical adenopathy.  Neurological: She is alert and oriented to person, place, and time.       NO FOCAL DEFICITS   Psychiatric: She has a normal mood and affect.          Assessment & Plan:

## 2011-02-25 NOTE — Assessment & Plan Note (Signed)
GERD CONTROLED.  CONTINUE PRILOSEC FOREVER. EGD Q3 YEARS IF NO DYSPLASIA. EGD LATE MAR 2013 0730. OPV 1-2 YEARS. EXPLAINED BARRETT'S DIAGNOSIS, MANAGEMENT, & RISK FACTORS FOR DEVELOPING ESOPHAGEAL CA.

## 2011-02-25 NOTE — Progress Notes (Signed)
Reminder in epic to have EGD in 3 years and to follow up in 1-2 years with OV

## 2011-04-01 ENCOUNTER — Encounter (HOSPITAL_COMMUNITY): Payer: Self-pay | Admitting: Pharmacy Technician

## 2011-04-03 MED ORDER — SODIUM CHLORIDE 0.45 % IV SOLN
Freq: Once | INTRAVENOUS | Status: AC
  Administered 2011-04-06: 08:00:00 via INTRAVENOUS

## 2011-04-06 ENCOUNTER — Ambulatory Visit (HOSPITAL_COMMUNITY)
Admission: RE | Admit: 2011-04-06 | Discharge: 2011-04-06 | Disposition: A | Discharge status: Home / Self Care | Payer: Medicare Other | Source: Ambulatory Visit | Attending: Gastroenterology | Admitting: Gastroenterology

## 2011-04-06 ENCOUNTER — Encounter (HOSPITAL_COMMUNITY)
Admission: RE | Disposition: A | Discharge status: Home / Self Care | Payer: Self-pay | Source: Ambulatory Visit | Attending: Gastroenterology

## 2011-04-06 ENCOUNTER — Encounter (HOSPITAL_COMMUNITY): Payer: Self-pay | Admitting: *Deleted

## 2011-04-06 DIAGNOSIS — K2270 Barrett's esophagus without dysplasia: Secondary | ICD-10-CM

## 2011-04-06 DIAGNOSIS — Z79899 Other long term (current) drug therapy: Secondary | ICD-10-CM | POA: Insufficient documentation

## 2011-04-06 DIAGNOSIS — K2990 Gastroduodenitis, unspecified, without bleeding: Secondary | ICD-10-CM

## 2011-04-06 DIAGNOSIS — K2970 Gastritis, unspecified, without bleeding: Secondary | ICD-10-CM

## 2011-04-06 DIAGNOSIS — K2940 Chronic atrophic gastritis without bleeding: Secondary | ICD-10-CM | POA: Insufficient documentation

## 2011-04-06 DIAGNOSIS — E785 Hyperlipidemia, unspecified: Secondary | ICD-10-CM | POA: Insufficient documentation

## 2011-04-06 DIAGNOSIS — I10 Essential (primary) hypertension: Secondary | ICD-10-CM | POA: Insufficient documentation

## 2011-04-06 HISTORY — PX: ESOPHAGOGASTRODUODENOSCOPY: SHX5428

## 2011-04-06 SURGERY — EGD (ESOPHAGOGASTRODUODENOSCOPY)
Anesthesia: Moderate Sedation

## 2011-04-06 MED ORDER — PROMETHAZINE HCL 25 MG/ML IJ SOLN
INTRAMUSCULAR | Status: AC
  Filled 2011-04-06: qty 1

## 2011-04-06 MED ORDER — BUTAMBEN-TETRACAINE-BENZOCAINE 2-2-14 % EX AERO
INHALATION_SPRAY | CUTANEOUS | Status: DC | PRN
  Administered 2011-04-06: 1 via TOPICAL

## 2011-04-06 MED ORDER — MEPERIDINE HCL 100 MG/ML IJ SOLN
INTRAMUSCULAR | Status: DC | PRN
  Administered 2011-04-06: 50 mg via INTRAVENOUS

## 2011-04-06 MED ORDER — STERILE WATER FOR IRRIGATION IR SOLN
Status: DC | PRN
  Administered 2011-04-06: 09:00:00

## 2011-04-06 MED ORDER — MIDAZOLAM HCL 5 MG/5ML IJ SOLN
INTRAMUSCULAR | Status: DC | PRN
  Administered 2011-04-06: 2 mg via INTRAVENOUS

## 2011-04-06 MED ORDER — MEPERIDINE HCL 100 MG/ML IJ SOLN
INTRAMUSCULAR | Status: AC
  Filled 2011-04-06: qty 1

## 2011-04-06 MED ORDER — MIDAZOLAM HCL 5 MG/5ML IJ SOLN
INTRAMUSCULAR | Status: AC
  Filled 2011-04-06: qty 5

## 2011-04-06 NOTE — H&P (Signed)
Vitals - Last Recorded       BP Pulse Temp(Src) Ht Wt BMI    111/61  74  97.6 F (36.4 C) (Temporal)  5\' 1"  (1.549 m)  134 lb 3.2 oz (60.873 kg)  25.36 kg/m2       Progress Notes     Jonette Eva, MD  02/25/2011 12:06 PM  Signed    Subjective:      Patient ID: Rhonda Hughes, female    DOB: 02/23/1940, 71 y.o.   MRN: [id number]   PCP: NIS   HPI LAST SEEN JAN 2012: EGD-BARRETT'S NO DYSPLASIA. HAs questions about why she needs another upper endoscopy. The patient denies abdominal or flank pain, anorexia, problems swallowing, nausea or vomiting, dysphagia, change in bowel habits or black or bloody stools or weight loss. BMs: nl.    Past Medical History   Diagnosis  Date   .  Hyperlipidemia     .  HTN (hypertension)     .  Anxiety     .  Diverticulosis     .  Barrett's esophagus without dysplasia     .  Depression         Past Surgical History   Procedure  Date   .  Partial hysterectomy  1980s   .  Colonoscopy  01/2008       multiple diverticula in desc and sigm colon   .  Esophagogastroduodenoscopy  01/2008       Barrett's esophagus, gastritis, no h.pylori, due follow-up 01/2011   .  Flexible sigmoidoscopy  08/11/2010       Procedure: FLEXIBLE SIGMOIDOSCOPY;  Surgeon: Arlyce Harman, MD;  Location: [facility];  Service: Endoscopy;  Laterality: N/A;       Allergies   Allergen  Reactions   .  Sulfa Antibiotics         Current Outpatient Prescriptions   Medication  Sig  Dispense  Refill   .  ALPRAZolam (XANAX) 0.25 MG tablet  Take 0.25 mg by mouth at bedtime as needed.           Marland Kitchen  atorvastatin (LIPITOR) 10 MG tablet  Take 10 mg by mouth daily.           .  Biotin 1000 MCG tablet  Take 1,000 mcg by mouth 3 (three) times daily.           .  Calcium Carbonate-Vitamin D (CALCIUM + D) 600-200 MG-UNIT TABS  Take by mouth.           .  Cyanocobalamin (VITAMIN B 12 PO)  Take 2,000 mg by mouth.           .  fish oil-omega-3 fatty acids 1000 MG capsule  Take 2 g by mouth daily.            Marland Kitchen  lisinopril-hydrochlorothiazide (PRINZIDE,ZESTORETIC) 20-12.5 MG per tablet  Take 1 tablet by mouth daily.           .  niacin (NIASPAN) 500 MG CR tablet  Take 500 mg by mouth at bedtime.           Marland Kitchen  omeprazole (PRILOSEC) 20 MG capsule  Take 20 mg by mouth daily.           .  potassium chloride SA (K-DUR,KLOR-CON) 20 MEQ tablet  Take 20 mEq by mouth 2 (two) times daily.           Marland Kitchen  venlafaxine (EFFEXOR-XR) 37.5 MG 24 hr capsule  Take 37.5 mg by mouth daily.           .           .               Family History   Problem  Relation  Age of Onset   .  Coronary artery disease  Father     .  Colon cancer  Neg Hx     .  GI problems  Mother         perforated colon                 Review of Systems     Objective:    Physical Exam  Vitals reviewed. Constitutional: She is oriented to person, place, and time. She appears well-developed and well-nourished. No distress.  HENT:   Head: Normocephalic and atraumatic.   Mouth/Throat: Oropharynx is clear and moist. No oropharyngeal exudate.  Eyes: Pupils are equal, round, and reactive to light. No scleral icterus.  Neck: Normal range of motion. Neck supple.  Cardiovascular: Normal rate, regular rhythm and normal heart sounds.   Pulmonary/Chest: Effort normal and breath sounds normal. No respiratory distress.  Abdominal: Soft. Bowel sounds are normal. She exhibits no distension. There is no tenderness.  Musculoskeletal: Normal range of motion. She exhibits no edema.  Lymphadenopathy:    She has no cervical adenopathy.  Neurological: She is alert and oriented to person, place, and time.       NO FOCAL DEFICITS  Psychiatric: She has a normal mood and affect.   Assessment & Plan:     Barrett's esophagus - Jonette Eva, MD  02/25/2011 12:04 PM  Signed GERD CONTROLED.   CONTINUE PRILOSEC FOREVER. EGD Q3 YEARS IF NO DYSPLASIA. EGD LATE MAR 2013 0730. OPV 1-2 YEARS. EXPLAINED BARRETT'S DIAGNOSIS, MANAGEMENT, & RISK  FACTORS FOR DEVELOPING ESOPHAGEAL CA.

## 2011-04-06 NOTE — Interval H&P Note (Signed)
History and Physical Interval Note:  04/06/2011 8:25 AM  Rhonda Hughes  has presented today for surgery, with the diagnosis of Barretts  The various methods of treatment have been discussed with the patient and family. After consideration of risks, benefits and other options for treatment, the patient has consented to  Procedure(s) (LRB): ESOPHAGOGASTRODUODENOSCOPY (EGD) (N/A) as a surgical intervention .  The patients' history has been reviewed, patient examined, no change in status, stable for surgery.  I have reviewed the patients' chart and labs.  Questions were answered to the patient's satisfaction.     [employer]

## 2011-04-06 NOTE — Discharge Instructions (Signed)
You have gastritis. I biopsied your stomach. 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 I BIOPSIED YOUR ESOPHAGUS.  CONTINUE PRILOSEC EVERY MORNING FOREVER. YOUR BIOPSIES WILL BE BACK IN 7 DAYS. FOLLOW UP IN 1-2 YEARS WITH DR. Lawanda Holzheimer.    UPPER ENDOSCOPY AFTER CARE Read the instructions outlined below and refer to this sheet in the next week. These discharge instructions provide you with general information on caring for yourself after you leave the hospital. While your treatment has been planned according to the most current medical practices available, unavoidable complications occasionally occur. If you have any problems or questions after discharge, call DR. Edythe Riches, [phone].  ACTIVITY  You may resume your regular activity, but move at a slower pace for the next 24 hours.   Take frequent rest periods for the next 24 hours.   Walking will help get rid of the air and reduce the bloated feeling in your belly (abdomen).   No driving for 24 hours (because of the medicine (anesthesia) used during the test).   You may shower.   Do not sign any important legal documents or operate any machinery for 24 hours (because of the anesthesia used during the test).    NUTRITION  Drink plenty of fluids.   You may resume your normal diet as instructed by your doctor.   Begin with a light meal and progress to your normal diet. Heavy or fried foods are harder to digest and may make you feel sick to your stomach (nauseated).   Avoid alcoholic beverages for 24 hours or as instructed.    MEDICATIONS  You may resume your normal medications.   WHAT YOU CAN EXPECT TODAY  Some feelings of bloating in the abdomen.   Passage of more gas than usual.    IF YOU HAD A BIOPSY TAKEN DURING THE UPPER ENDOSCOPY:  Eat a soft diet IF YOU HAVE NAUSEA, BLOATING, ABDOMINAL PAIN, OR VOMITING.    FINDING OUT THE RESULTS OF YOUR TEST Not all test results are available during your visit. DR. Darrick Penna WILL CALL YOU WITHIN 7  DAYS OF YOUR PROCEDUE WITH YOUR RESULTS. Do not assume everything is normal if you have not heard from DR. Tyquisha Sharps IN ONE WEEK, CALL HER OFFICE AT [phone].  SEEK IMMEDIATE MEDICAL ATTENTION AND CALL THE OFFICE: [phone] IF:  You have more than a spotting of blood in your stool.   Your belly is swollen (abdominal distention).   You are nauseated or vomiting.   You have a temperature over 101F.   You have abdominal pain or discomfort that is severe or gets worse throughout the day.   Gastritis  Gastritis is an inflammation (the body's way of reacting to injury and/or infection) of the stomach. It is often caused by viral or bacterial (germ) infections. It can also be caused BY ASPIRIN, BC/GOODY POWDER'S, (IBUPROFEN) MOTRIN, OR ALEVE (NAPROXEN), chemicals (including alcohol), SPICY FOODS, and medications. This illness may be associated with generalized malaise (feeling tired, not well), UPPER ABDOMINAL STOMACH cramps, and fever. One common bacterial cause of gastritis is an organism known as H. Pylori. This can be treated with antibiotics.     Barrett's Esophagus The esophagus is the muscular tube that carries food and saliva from the mouth to the stomach. Barrett's esophagus involves changes in the esophagus. Some of its lining is replaced by a type of tissue similar to that found in the intestine. This process is called intestinal metaplasia.   While Barrett's esophagus may cause no symptoms itself, a small number of  people with this condition develop a relatively rare but often deadly type of cancer of the esophagus. It is called esophageal adenocarcinoma. Barrett's esophagus is associated with the common condition called GERD (gastroesophageal reflux disease).  HOW THE ESOPHAGUS WORKS The esophagus carries food, liquids, and saliva from the mouth to the stomach. The stomach acts as a container to start digestion and pump food and liquids into the intestines in a controlled  process. Food can then be properly digested over time. Nutrients can be taken in (absorbed) by the intestines. The esophagus moves food to the stomach by coordinated contractions of its muscular lining. This process is automatic. People are usually not aware of it. Many people have felt their esophagus when they:  Swallow something too large.   Try to eat too quickly.   Drink very hot or cold liquids.  They then feel the movement of the food or drink down the esophagus into the stomach. This may be an uncomfortable feeling.  GERD (GASTROESOPHAGEAL REFLUX DISEASE) Having some stomach contents (liquids or gas) sometimes reflux (come back up from the stomach into the esophagus) is considered normal. When it happens often, and causes other symptoms, it is considered a medical problem or disease. However, it is not necessarily a serious one or one that requires seeing a caregiver. The stomach produces acid and enzymes to digest food. When this mixture refluxes into the esophagus more often than normal or for a longer period of time than normal, it may produce symptoms. These symptoms are often called acid reflux. They are often described by people as heartburn, indigestion, or "gas". The symptoms often consist of a burning sensation below and behind the lower part of the breastbone or sternum. Almost everyone has experienced these symptoms at least once. This is typically a result of overeating. Other things that provoke GERD symptoms include:  Being overweight.   Eating certain types of foods.   Being pregnant.  In most people, GERD symptoms may last only a short time and require no treatment at all.  More continual symptoms are often quickly relieved by over-the-counter acid-reducing agents, such as antacids.  Overall, GERD is one of the most common medical conditions. About 20 percent of the population can be affected over a lifetime.  GERD AND BARRETT'S ESOPHAGUS The exact causes of Barrett's  esophagus are not known. It is thought to be caused in part by the same factors that cause GERD. People who do not have heartburn can have Barrett's esophagus. However, it is found about 3 to 5 times more often in people with this condition. Barrett's esophagus is uncommon in children. The average age at diagnosis is 48. But it is usually difficult to know when the problem started. It is about twice as common in men as in women. It is much more common in white men than in men of other races.  IMPORTANT POINTS TO REMEMBER  In Barrett's esophagus, the cells lining the esophagus change. They become similar to the cells lining the intestine.   Barrett's esophagus is connected with gastroesophageal reflux disease or GERD.   A small number of people with Barrett's esophagus may develop esophageal cancer.   Barrett's esophagus is diagnosed by upper gastrointestinal endoscopy and biopsy.   People who have Barrett's esophagus should have periodic esophageal exams.   Taking acid-blocking drugs for GERD may help improve Barrett's esophagus.   HOME CARE INSTRUCTIONS Eat 2-3 hours before going to bed.  Try to reach and maintain a healthy weight.  Do not eat just a few very large meals. Instead, eat 4 TO 6 smaller meals throughout the day.  Try to identify foods and beverages that make your reflux worse, and avoid these.  Avoid tight clothing.  Do not exercise right after eating.

## 2011-04-06 NOTE — Op Note (Signed)
[facility] [address] [place], [place]  [address]  ENDOSCOPY PROCEDURE REPORT  PATIENT:  Rhonda Hughes, Rhonda Hughes  MR#:  [id number] BIRTHDATE:  [birth year], 70 yrs. old  GENDER:  female  ENDOSCOPIST:  Jonette Eva, MD Referred by:  Rance Muir, M.D.  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 Informed consent was obtained from the patient after explaining the benefits, risks, and alternatives to the procedure.  The patient was connected to the monitor and placed in the left lateral position.  Continuous oxygen was provided by nasal cannula and IV medicine administered through an indwelling cannula.  After administration of sedation, the patient's esophagus was intubated and the EG-[id number] ([id number]) endoscope was advanced under direct visualization to the second portion of the duodenum.  The scope was removed slowly by carefully examining the color, texture, anatomy, and integrity of the mucosa on the way out.  The patient was recovered in endoscopy and discharged home in satisfactory condition. <<PROCEDUREIMAGES>>  Mild gastritis was found & BIOPSIED VIA COLD FORCEPS.  IRREGULAR Z -LINE. 1 CM TONGUE OF Barrett's esophagus was found & BIOPSIED VIA COLD FORECEPS. NL DUODENUM.  COMPLICATIONS:    None  ENDOSCOPIC IMPRESSION: 1) Mild gastritis 2) Barrett's esophagus-SHORT SEGMENT ( 1 CM)  RECOMMENDATIONS: OMP QD FOREVER OPV IN 1-2 YEARS AWAIT BIOPSIES-EGD IN 3 YEAR IF NO DYSPLASIA  REPEAT EXAM:  No  ______________________________ Jonette Eva, MD  CC:  n. eSIGNED:   [name] at 04/06/2011 04:14 PM  Tracie Harrier, [phone]

## 2011-04-08 ENCOUNTER — Telehealth: Payer: Self-pay | Admitting: Gastroenterology

## 2011-04-08 NOTE — Telephone Encounter (Signed)
Called.  Busy signal.

## 2011-04-08 NOTE — Telephone Encounter (Signed)
Results Cc to PCP an reminders are in the computer

## 2011-04-08 NOTE — Telephone Encounter (Signed)
PLEASE CALL PT. SHE DID NOT HAVE ANY ADVANCED CHANGES IN HER ESOPHAGUS. SHE HAS MILD GASTRITIS. CONTINUE OMEPRAZOLE FOREVER. OPV IN 1-2 YEARS. REPEAT EGD IN 3 YEARS. TCS IN 2020.

## 2011-04-09 ENCOUNTER — Encounter (HOSPITAL_COMMUNITY): Payer: Self-pay | Admitting: Gastroenterology

## 2011-04-09 NOTE — Telephone Encounter (Signed)
Called pt and informed of results.

## 2011-04-10 NOTE — Telephone Encounter (Signed)
Reminders in [place]

## 2011-09-07 ENCOUNTER — Encounter: Payer: Self-pay | Admitting: Internal Medicine

## 2011-09-07 ENCOUNTER — Ambulatory Visit (INDEPENDENT_AMBULATORY_CARE_PROVIDER_SITE_OTHER): Payer: Medicare Other | Admitting: Internal Medicine

## 2011-09-07 VITALS — BP 124/80 | HR 62 | Temp 98.4°F | Ht 61.0 in | Wt 131.4 lb

## 2011-09-07 DIAGNOSIS — R0609 Other forms of dyspnea: Secondary | ICD-10-CM

## 2011-09-07 DIAGNOSIS — R0600 Dyspnea, unspecified: Secondary | ICD-10-CM

## 2011-09-07 DIAGNOSIS — R0989 Other specified symptoms and signs involving the circulatory and respiratory systems: Secondary | ICD-10-CM

## 2011-09-07 NOTE — Patient Instructions (Addendum)
Your shortness of breath - is unclear why you have it Please have breathing test called full PFT at [facility] Once done, call us and then I will review and get back to you Based on breathing test result, I will have you come in or have more tests done; possibly a scan

## 2011-09-07 NOTE — Progress Notes (Signed)
Subjective:    Patient ID: Rhonda Hughes, female    DOB: 12-Jan-1941, 71 y.o.   MRN: [id number]  HPI PCP is Provider Not In System  Self referred.  Body mass index is 24.83 kg/(m^2).  reports that she quit smoking about 4 years ago. Her smoking use included Cigarettes. She has a 58 pack-year smoking history. She has never used smokeless tobacco.    IOV 09/07/2011 71 year old female. Self referred for dyspnea. Retiired Producer, television/film/video.  Reports she loves to work out in yard. However, noticing dyspnea with yard work now. Insidious onset. Noticed it last summer with yard work. Feels this summer 2013 it is worse. Did not notice it much in winter 2012/early 2013 because she is more sedentary. There is associated chest tightness (but not pain other than occ small tingling sensation)  in sternal area. Rests by going home inside and this relieves dyspnea. Notices dyspnea with stairs and is again improved with rest. Chest tightness also improves with rest. She denies associated cough other than a month ago describes acute bronchitis during which she coughed a lot but it resolved. Denies associated hemoptysis, orthopnea, edema, paroxysmal nocturnal dyspnea, wheezing, chest pain or weight loss, fever, sputum.  There is no variation of symptoms with dust, pollen, fumes  She is upset that she is unable to do as much yard work as she would love to Cabin crew beds, vegetable garden)   CAT COPD Symptom and Quality of Life Score ([employer])  0 (no burden) to 5 (highest burden)  Never Cough -> Cough all the time 0  No phlegm in chest -> Chest is full of phlegm 0  No chest tightness -> Chest feels very tight 2  No dyspnea for 1 flight stairs/hill -> Very dyspneic for 1 flight of stairs 3  No limitations for ADL at home -> Very limited with ADL at home 4  Confident leaving home -> Not at all confident leaving home 0  Sleep soundly -> Do not sleep soundly because of lung condition 0  Lots of  Energy -> No energy at all 0  TOTAL Score (max 40)  9      Fam Hx  - Dad: CAD. SMoked + - Mom: Breathing problems NOS. Smoked + - Sister: abnormal CXR NOS. Smoked +  Occ Expo  - Producer, television/film/video. No birds. No Mold  Tests  - No hx of cxr or pft . Hgb 12.2gm% in Dec 2012 - Walk test 09/07/2011: 86% on first lap 185 feet  = Spirometyr - fev1 1.88L/96%, Ratio 60, FVC 3.1L/125%    Past Medical History  Diagnosis Date  . Hyperlipidemia   . HTN (hypertension)   . Anxiety   . Diverticulosis   . Barrett's esophagus without dysplasia   . Depression   . Rectocele      Family History  Problem Relation Age of Onset  . Coronary artery disease Father   . Colon cancer Neg Hx   . GI problems Mother     perforated colon      History   Social History  . Marital Status: Married    Spouse Name: N/A    Number of Children: 1  . Years of Education: N/A   Occupational History  . [employer] in [place]     retired   Social History Main Topics  . Smoking status: Former Smoker -- 1.0 packs/day for 58 years    Types: Cigarettes    Quit date:  03/20/2007  . Smokeless tobacco: Never Used  . Alcohol Use: No  . Drug Use: No  . Sexually Active: Not on file   Other Topics Concern  . Not on file   Social History Narrative  . No narrative on file     Allergies  Allergen Reactions  . Adhesive (Tape) Other (See Comments)    Turns skin red  . Sulfa Antibiotics Other (See Comments)    Childhood allergy     Outpatient Prescriptions Prior to Visit  Medication Sig Dispense Refill  . acetaminophen (TYLENOL) 500 MG tablet Take 1,000 mg by mouth every 6 (six) hours as needed. For pain      . ALPRAZolam (XANAX) 0.25 MG tablet Take 0.25 mg by mouth daily as needed. For anxiety      . atorvastatin (LIPITOR) 10 MG tablet Take 10 mg by mouth at bedtime.       . Biotin 1000 MCG tablet Take 1,000 mcg by mouth daily.       . Calcium Carbonate-Vitamin D (CALCIUM + D) 600-200 MG-UNIT TABS  Take 1 tablet by mouth 2 (two) times daily.       . Cyanocobalamin (VITAMIN B 12 PO) Take 2,000 mg by mouth daily.       . fish oil-omega-3 fatty acids 1000 MG capsule Take 2 g by mouth daily.        Marland Kitchen lisinopril-hydrochlorothiazide (PRINZIDE,ZESTORETIC) 20-12.5 MG per tablet Take 1 tablet by mouth daily.        . niacin 500 MG tablet Take 500 mg by mouth daily.      Marland Kitchen omeprazole (PRILOSEC) 20 MG capsule Take 20 mg by mouth daily.        . potassium chloride SA (K-DUR,KLOR-CON) 20 MEQ tablet Take 20 mEq by mouth daily.       Marland Kitchen venlafaxine (EFFEXOR-XR) 37.5 MG 24 hr capsule Take 37.5 mg by mouth daily.        . Probiotic Product (ALIGN) 4 MG CAPS Take 4 mg by mouth daily.  7 capsule  0         Review of Systems  Constitutional: Positive for fatigue. Negative for fever, chills, diaphoresis, activity change, appetite change and unexpected weight change.  HENT: Positive for congestion and postnasal drip. Negative for ear pain, nosebleeds, sore throat, rhinorrhea, sneezing, mouth sores, trouble swallowing, voice change, sinus pressure and tinnitus.   Eyes: Negative for visual disturbance.  Respiratory: Positive for cough and chest tightness. Negative for choking, shortness of breath and wheezing.   Cardiovascular: Negative for chest pain and palpitations.  Gastrointestinal: Negative for nausea, vomiting, abdominal pain and constipation.  Genitourinary: Negative for difficulty urinating.  Musculoskeletal: Positive for back pain. Negative for myalgias and gait problem.  Skin: Negative for rash.  Neurological: Negative for dizziness, tremors, seizures, weakness, light-headedness, numbness and headaches.  Hematological: Does not bruise/bleed easily.  Psychiatric/Behavioral: Negative for confusion, disturbed wake/sleep cycle and agitation. The patient is not nervous/anxious.        Objective:   Physical Exam  Vitals reviewed. Constitutional: She is oriented to person, place, and time. She  appears well-developed and well-nourished. No distress.       Body mass index is 24.83 kg/(m^2).   HENT:  Head: Normocephalic and atraumatic.  Right Ear: External ear normal.  Left Ear: External ear normal.  Mouth/Throat: Oropharynx is clear and moist. No oropharyngeal exudate.  Eyes: Conjunctivae and EOM are normal. Pupils are equal, round, and reactive to light. Right eye exhibits no  discharge. Left eye exhibits no discharge. No scleral icterus.  Neck: Normal range of motion. Neck supple. No JVD present. No tracheal deviation present. No thyromegaly present.  Cardiovascular: Normal rate, regular rhythm, normal heart sounds and intact distal pulses.  Exam reveals no gallop and no friction rub.   No murmur heard. Pulmonary/Chest: Effort normal and breath sounds normal. No respiratory distress. She has no wheezes. She has no rales. She exhibits no tenderness.  Abdominal: Soft. Bowel sounds are normal. She exhibits no distension and no mass. There is no tenderness. There is no rebound and no guarding.  Musculoskeletal: Normal range of motion. She exhibits no edema and no tenderness.  Lymphadenopathy:    She has no cervical adenopathy.  Neurological: She is alert and oriented to person, place, and time. She has normal reflexes. No cranial nerve deficit. She exhibits normal muscle tone. Coordination normal.  Skin: Skin is warm and dry. No rash noted. She is not diaphoretic. No erythema. No pallor.  Psychiatric: She has a normal mood and affect. Her behavior is normal. Judgment and thought content normal.          Assessment & Plan:

## 2011-09-13 DIAGNOSIS — R0600 Dyspnea, unspecified: Secondary | ICD-10-CM | POA: Insufficient documentation

## 2011-09-13 NOTE — Assessment & Plan Note (Addendum)
SMoker, desaturates with sub maximal exertion in office but spirometry in non-revealing. ? Mixed ILD-Obst lung disease, ? pulm htn  PLAN Get PFT and based on that ? CT chest

## 2011-09-23 ENCOUNTER — Telehealth: Payer: Self-pay | Admitting: Internal Medicine

## 2011-09-23 ENCOUNTER — Ambulatory Visit (HOSPITAL_COMMUNITY)
Admission: RE | Admit: 2011-09-23 | Discharge: 2011-09-23 | Disposition: A | Discharge status: Home / Self Care | Payer: Medicare Other | Source: Ambulatory Visit | Attending: Internal Medicine | Admitting: Internal Medicine

## 2011-09-23 DIAGNOSIS — R0602 Shortness of breath: Secondary | ICD-10-CM | POA: Insufficient documentation

## 2011-09-23 DIAGNOSIS — R0600 Dyspnea, unspecified: Secondary | ICD-10-CM

## 2011-09-23 MED ORDER — ALBUTEROL SULFATE (5 MG/ML) 0.5% IN NEBU
2.5000 mg | INHALATION_SOLUTION | Freq: Once | RESPIRATORY_TRACT | Status: AC
  Administered 2011-09-23: 2.5 mg via RESPIRATORY_TRACT

## 2011-09-23 NOTE — Telephone Encounter (Signed)
lmomtcb x1 for pt at both #'s listed 

## 2011-09-23 NOTE — Telephone Encounter (Signed)
I spoke with pt and she states she was told to call once she completed the PFt. I will get results and give to MR to review.Carron Curie, CMA

## 2011-09-23 NOTE — Procedures (Signed)
NAMEIRIEL, NASON               ACCOUNT NO.:  [id number]  MEDICAL RECORD NO.:  [id number]  LOCATION:  RESP                          FACILITY:  [facility]  PHYSICIAN:  Lasandra Batley L. Juanetta Gosling, M.D.DATE OF BIRTH:  11-02-1940  DATE OF PROCEDURE: DATE OF DISCHARGE:                           PULMONARY FUNCTION TEST   Reason for pulmonary function testing is shortness of breath.  1. Spirometry does not show a ventilatory defect but does show     evidence of airflow obstruction. 2. Lung volumes show air trapping and no evidence of restrictive     change. 3. DLCO is mildly reduced and corrects somewhat when volume is     accounted for. 4. Airway resistance is high confirming the presence of airflow     obstruction. 5. There is significant improvement with inhaled bronchodilator.     Kian Ottaviano L. Juanetta Gosling, M.D.     ELH/MEDQ  D:  09/23/2011  T:  09/23/2011  Job:  [id number]

## 2011-09-25 LAB — PULMONARY FUNCTION TEST

## 2011-09-30 ENCOUNTER — Telehealth: Payer: Self-pay | Admitting: Internal Medicine

## 2011-09-30 DIAGNOSIS — R0902 Hypoxemia: Secondary | ICD-10-CM

## 2011-09-30 DIAGNOSIS — R0600 Dyspnea, unspecified: Secondary | ICD-10-CM

## 2011-09-30 NOTE — Telephone Encounter (Signed)
Called and spoke w patient. She is inquiring if MR has read PFTs--states "I dont want to bother yall but I just cant take it anymore".  Dr. Marchelle Gearing please advise!! Thanks

## 2011-10-01 NOTE — Telephone Encounter (Signed)
I spoke with Rhonda Hughes and is aware of PFT results. She advised me she did not have any questions and understood what I was advising her. She agree'd to have CT and ONO done. Orders were already placed. Rhonda Hughes was transferred to [facility] to discuss appt regarding for CT. She is also aware DME will contact her regarding getting ONO set up. She voiced her understanding. Nothing further was needed

## 2011-10-01 NOTE — Telephone Encounter (Signed)
She only had PFTs 09/25/11 which was just heading into weekend and I have been on night shift. Please explain the reasons for the delay.   PFTs are suggesting asthma on one part (spirometry and lung volumes) but on part is suggesting emphysema (low diffusion). They are not giving full answer for dyspnea and desaturation.   She next needs CT chest wo contrastl; ordered. I had explained this to her  She also needs ONO; orderd because she desatured at office with exertion  I will review CT results and advise next step  If she is upset or confused or anxious let me know; I will call her myself

## 2011-10-02 ENCOUNTER — Ambulatory Visit (INDEPENDENT_AMBULATORY_CARE_PROVIDER_SITE_OTHER)
Admission: RE | Admit: 2011-10-02 | Discharge: 2011-10-02 | Disposition: A | Discharge status: Home / Self Care | Payer: Medicare Other | Source: Ambulatory Visit | Attending: Internal Medicine | Admitting: Internal Medicine

## 2011-10-02 DIAGNOSIS — R0600 Dyspnea, unspecified: Secondary | ICD-10-CM

## 2011-10-02 DIAGNOSIS — R0902 Hypoxemia: Secondary | ICD-10-CM

## 2011-10-02 DIAGNOSIS — R0609 Other forms of dyspnea: Secondary | ICD-10-CM

## 2011-10-05 ENCOUNTER — Other Ambulatory Visit: Payer: Medicare Other

## 2011-10-07 NOTE — Telephone Encounter (Signed)
Pt PFT results are scanned into [facility] under procedure for you to review. They were done at [facility].Carron Curie, CMA

## 2011-10-08 NOTE — Telephone Encounter (Signed)
Rhonda Hughes. Rhonda Hughes, pls advise on where this pt can be worked in next week with MR. He has no available slots. Thanks.

## 2011-10-08 NOTE — Telephone Encounter (Signed)
Pt aware and appt set for 10/13/11.Carron Curie, CMA

## 2011-10-08 NOTE — Telephone Encounter (Signed)
Let her know CT scan shows emphysema but breathing test numbers indicate this is only mild. In other worseds, while she does have a lung problem her symptoms are out of proportion. So, I would like her to come in next week and see me and discuss epiric Rx trial vs cardiology referral (there are calcium deposits on heart) v more testing. Give her appt please  Thanks MR

## 2011-10-13 ENCOUNTER — Ambulatory Visit (INDEPENDENT_AMBULATORY_CARE_PROVIDER_SITE_OTHER): Payer: Medicare Other | Admitting: Internal Medicine

## 2011-10-13 ENCOUNTER — Encounter: Payer: Self-pay | Admitting: Internal Medicine

## 2011-10-13 VITALS — BP 122/70 | HR 61 | Temp 97.4°F | Ht 61.5 in | Wt 131.0 lb

## 2011-10-13 DIAGNOSIS — R0609 Other forms of dyspnea: Secondary | ICD-10-CM

## 2011-10-13 DIAGNOSIS — Z789 Other specified health status: Secondary | ICD-10-CM

## 2011-10-13 DIAGNOSIS — J449 Chronic obstructive pulmonary disease, unspecified: Secondary | ICD-10-CM

## 2011-10-13 DIAGNOSIS — R0600 Dyspnea, unspecified: Secondary | ICD-10-CM

## 2011-10-13 DIAGNOSIS — R0789 Other chest pain: Secondary | ICD-10-CM

## 2011-10-13 DIAGNOSIS — Z9189 Other specified personal risk factors, not elsewhere classified: Secondary | ICD-10-CM

## 2011-10-13 DIAGNOSIS — R0989 Other specified symptoms and signs involving the circulatory and respiratory systems: Secondary | ICD-10-CM

## 2011-10-13 MED ORDER — BUDESONIDE-FORMOTEROL FUMARATE 80-4.5 MCG/ACT IN AERO: 2.0000 | INHALATION_SPRAY | Freq: Two times a day (BID) | RESPIRATORY_TRACT | Status: DC

## 2011-10-13 NOTE — Assessment & Plan Note (Signed)
Ct scan with emphysema. Ex-smoker. PFT only isolated reduction in diffusion that is mild and should not cause exertional desaturations but she still desaturates. Spriometry is normal but has positive BD response. Overall,  Confusiong picture but appears to have emphysema with asthma component that can explain symptoms but not the desaturation with exertion. Will Rx with symbicort and reassess with spirometry and walk test for o2 (at this point she does not want to start o2)

## 2011-10-13 NOTE — Assessment & Plan Note (Signed)
She has CAD risk factors. CT shows calcification coronaries. Has numerous CAD risk factors. Will refer to [place] cards

## 2011-10-13 NOTE — Patient Instructions (Addendum)
Please start symbicort 80/4.5 2 puff twice daily - take sample, and show technique. Script sent to [facility] at your place Get flu shot from your PMD Please see  cards in [place] Return to see me in 2 months to report progress: spirometry at followup with Jerolyn Shin and walk test by [facility]

## 2011-10-13 NOTE — Progress Notes (Signed)
Subjective:    Patient ID: Rhonda Hughes, female    DOB: May 24, 1940, 71 y.o.   MRN: [id number]  HPI PCP is Provider Not In System  Self referred.  Body mass index is 24.83 kg/(m^2).  reports that she quit smoking about 4 years ago. Her smoking use included Cigarettes. She has a 58 pack-year smoking history. She has never used smokeless tobacco.    IOV 09/07/2011 71 year old female. Self referred for dyspnea. Retiired Producer, television/film/video.  Reports she loves to work out in yard. However, noticing dyspnea with yard work now. Insidious onset. Noticed it last summer with yard work. Feels this summer 2013 it is worse. Did not notice it much in winter 2012/early 2013 because she is more sedentary. There is associated chest tightness (but not pain other than occ small tingling sensation)  in sternal area. Rests by going home inside and this relieves dyspnea. Notices dyspnea with stairs and is again improved with rest. Chest tightness also improves with rest. She denies associated cough other than a month ago describes acute bronchitis during which she coughed a lot but it resolved. Denies associated hemoptysis, orthopnea, edema, paroxysmal nocturnal dyspnea, wheezing, chest pain or weight loss, fever, sputum.  There is no variation of symptoms with dust, pollen, fumes  She is upset that she is unable to do as much yard work as she would love to Cabin crew beds, vegetable garden)   CAT COPD Symptom and Quality of Life Score ([employer])  0 (no burden) to 5 (highest burden)  Never Cough -> Cough all the time 0  No phlegm in chest -> Chest is full of phlegm 0  No chest tightness -> Chest feels very tight 2  No dyspnea for 1 flight stairs/hill -> Very dyspneic for 1 flight of stairs 3  No limitations for ADL at home -> Very limited with ADL at home 4  Confident leaving home -> Not at all confident leaving home 0  Sleep soundly -> Do not sleep soundly because of lung condition 0  Lots of  Energy -> No energy at all 0  TOTAL Score (max 40)  9      Fam Hx  - Dad: CAD. SMoked + - Mom: Breathing problems NOS. Smoked + - Sister: abnormal CXR NOS. Smoked +  Occ Expo  - Producer, television/film/video. No birds. No Mold  Tests  - No hx of cxr or pft . Hgb 12.2gm% in Dec 2012 - Walk test 09/07/2011: 86% on first lap 185 feet  = Spirometyr - fev1 1.88L/96%, Ratio 60, FVC 3.1L/125%  REC Your shortness of breath - is unclear why you have it  Please have breathing test called full PFT at [facility]  Once done, call us and then I will review and get back to you  Based on breathing test result, I will have you come in or have more tests done; possibly a scan  OV 10/13/2011 Folluwo dyspnea: No change since last visit. STill with dyspnea and chest tightness. Since last night though chest tightness and dyspnea flaring up a bit more; but part of usual ups and and downs. No fever or sputum. Dyspnea is purely exertional component - notices with walking and yards. Improved by rest. No relation with dust, pollen, or chemicals. Of note, the chest tightnnes is not pain but she points to sternal area and can happen at rest. Overall severity still rated as mild-moderate. Not progressive since last visit    PFTs  09/25/11:  Suggesting is normal but positive BD response suggests  asthma on one part (spirometry and lung volumes) but on part is suggesting emphysema (low diffusion but only 71%). They are not giving full answer for dyspnea and desaturation.   CT chest 10/02/11: I MPRESSION:  1. Mild diffuse bronchial wall thickening and moderate - severe  centrilobular emphysema; findings compatible with COPD.  2. Atherosclerosis, including left main and three-vessel coronary  artery disease. Assessment for potential risk factor modification,  dietary therapy or pharmacologic therapy may be warranted, if  clinically indicated.  Original Report Authenticated By: Florencia Reasons, M.D.    CAD risk factors  -  father, paternal aunt, sisters - CAD +  - smoker  - on lipitor  Current outpatient prescriptions:acetaminophen (TYLENOL) 500 MG tablet, Take 1,000 mg by mouth every 6 (six) hours as needed. For pain, Disp: , Rfl: ;  ALPRAZolam (XANAX) 0.25 MG tablet, Take 0.25 mg by mouth daily as needed. For anxiety, Disp: , Rfl: ;  atorvastatin (LIPITOR) 10 MG tablet, Take 10 mg by mouth at bedtime. , Disp: , Rfl: ;  Biotin 1000 MCG tablet, Take 1,000 mcg by mouth daily. , Disp: , Rfl:  Calcium Carbonate-Vitamin D (CALCIUM + D) 600-200 MG-UNIT TABS, Take 1 tablet by mouth 2 (two) times daily. , Disp: , Rfl: ;  Cyanocobalamin (VITAMIN B 12 PO), Take 2,000 mg by mouth daily. , Disp: , Rfl: ;  fish oil-omega-3 fatty acids 1000 MG capsule, Take 2 g by mouth daily.  , Disp: , Rfl: ;  lisinopril-hydrochlorothiazide (PRINZIDE,ZESTORETIC) 20-12.5 MG per tablet, Take 1 tablet by mouth daily.  , Disp: , Rfl:  niacin 500 MG tablet, Take 500 mg by mouth daily., Disp: , Rfl: ;  omeprazole (PRILOSEC) 20 MG capsule, Take 20 mg by mouth daily.  , Disp: , Rfl: ;  potassium chloride SA (K-DUR,KLOR-CON) 20 MEQ tablet, Take 20 mEq by mouth daily. , Disp: , Rfl: ;  venlafaxine (EFFEXOR-XR) 37.5 MG 24 hr capsule, Take 37.5 mg by mouth daily.  , Disp: , Rfl: ;  DISCONTD: niacin (NIASPAN) 500 MG CR tablet, Take 500 mg by mouth at bedtime.  , Disp: , Rfl:    Review of Systems  Constitutional: Negative for fever and unexpected weight change.  HENT: Negative for ear pain, nosebleeds, congestion, sore throat, rhinorrhea, sneezing, trouble swallowing, dental problem, postnasal drip and sinus pressure.   Eyes: Negative for redness and itching.  Respiratory: Negative for cough, chest tightness, shortness of breath and wheezing.   Cardiovascular: Negative for palpitations and leg swelling.  Gastrointestinal: Negative for nausea and vomiting.  Genitourinary: Negative for dysuria.  Musculoskeletal: Negative for joint swelling.  Skin: Negative for  rash.  Neurological: Negative for headaches.  Hematological: Does not bruise/bleed easily.  Psychiatric/Behavioral: Negative for dysphoric mood. The patient is not nervous/anxious.        Objective:   Physical Exam Vitals reviewed. Constitutional: She is oriented to person, place, and time. She appears well-developed and well-nourished. No distress.       Body mass index is 24.83 kg/(m^2).   HENT:  Head: Normocephalic and atraumatic.  Right Ear: External ear normal.  Left Ear: External ear normal.  Mouth/Throat: Oropharynx is clear and moist. No oropharyngeal exudate.  Eyes: Conjunctivae and EOM are normal. Pupils are equal, round, and reactive to light. Right eye exhibits no discharge. Left eye exhibits no discharge. No scleral icterus.  Neck: Normal range of motion. Neck supple. No JVD present. No  tracheal deviation present. No thyromegaly present.  Cardiovascular: Normal rate, regular rhythm, normal heart sounds and intact distal pulses.  Exam reveals no gallop and no friction rub.   No murmur heard. Pulmonary/Chest: Effort normal and breath sounds normal. No respiratory distress. She has no wheezes. She has no rales. She exhibits no tenderness.  Abdominal: Soft. Bowel sounds are normal. She exhibits no distension and no mass. There is no tenderness. There is no rebound and no guarding.  Musculoskeletal: Normal range of motion. She exhibits no edema and no tenderness.  Lymphadenopathy:    She has no cervical adenopathy.  Neurological: She is alert and oriented to person, place, and time. She has normal reflexes. No cranial nerve deficit. She exhibits normal muscle tone. Coordination normal.  Skin: Skin is warm and dry. No rash noted. She is not diaphoretic. No erythema. No pallor.  Psychiatric: She has a normal mood and affect. Her behavior is normal. Judgment and thought content normal.            Assessment & Plan:

## 2011-10-14 ENCOUNTER — Encounter: Payer: Self-pay | Admitting: Cardiovascular Disease

## 2011-10-14 ENCOUNTER — Encounter: Payer: Self-pay | Admitting: *Deleted

## 2011-10-14 ENCOUNTER — Ambulatory Visit (INDEPENDENT_AMBULATORY_CARE_PROVIDER_SITE_OTHER): Payer: Medicare Other | Admitting: Cardiovascular Disease

## 2011-10-14 VITALS — BP 135/65 | HR 63 | Ht 61.0 in | Wt 130.0 lb

## 2011-10-14 DIAGNOSIS — R079 Chest pain, unspecified: Secondary | ICD-10-CM

## 2011-10-14 LAB — BASIC METABOLIC PANEL
CO2: 30 mEq/L (ref 19–32)
Calcium: 9.3 mg/dL (ref 8.4–10.5)
Creatinine, Ser: 0.9 mg/dL (ref 0.4–1.2)
Glucose, Bld: 91 mg/dL (ref 70–99)

## 2011-10-14 LAB — CBC WITH DIFFERENTIAL/PLATELET
Basophils Absolute: 0 10*3/uL (ref 0.0–0.1)
Eosinophils Absolute: 0.1 10*3/uL (ref 0.0–0.7)
Hemoglobin: 13.1 g/dL (ref 12.0–15.0)
Lymphocytes Relative: 28.6 % (ref 12.0–46.0)
MCHC: 33.5 g/dL (ref 30.0–36.0)
Monocytes Absolute: 0.5 10*3/uL (ref 0.1–1.0)
Neutro Abs: 5.7 10*3/uL (ref 1.4–7.7)
Neutrophils Relative %: 64.8 % (ref 43.0–77.0)
RDW: 12.6 % (ref 11.5–14.6)

## 2011-10-14 NOTE — Patient Instructions (Addendum)
  Your physician recommends that you schedule a follow-up appointment in: 4 weeks.   Your physician has requested that you have a cardiac catheterization. Cardiac catheterization is used to diagnose and/or treat various heart conditions. Doctors may recommend this procedure for a number of different reasons. The most common reason is to evaluate chest pain. Chest pain can be a symptom of coronary artery disease (CAD), and cardiac catheterization can show whether plaque is narrowing or blocking your heart's arteries. This procedure is also used to evaluate the valves, as well as measure the blood flow and oxygen levels in different parts of your heart. For further information please visit [email or web address] Please follow instruction sheet, as given. Scheduled for Sept 27,2013   Coronary Angiography Coronary angiography is an X-ray procedure used to look at the arteries in the heart. In this procedure, a dye is injected through a long, hollow tube (catheter). The catheter is about the size of a piece of cooked spaghetti. The catheter injects a dye into an artery in your groin. X-rays are then taken to show if there is a blockage in the arteries of your heart. BEFORE THE PROCEDURE   Let your caregiver know if you have allergies to shellfish or contrast dye. Also let your caregiver know if you have kidney problems or failure.   Do not eat or drink starting from midnight up to the time of the procedure, or as directed.   You may drink enough water to take your medications the morning of the procedure if you were instructed to do so.   You should be at the hospital or outpatient facility where the procedure is to be done 60 minutes prior to the procedure or as directed.  PROCEDURE  You may be given an IV medication to help you relax before the procedure.   You will be prepared for the procedure by washing and shaving the area where the catheter will be inserted. This is usually done in the groin but  may be done in the fold of your arm by your elbow.   A medicine will be given to numb your groin where the catheter will be inserted.   A specially trained doctor will insert the catheter into an artery in your groin. The catheter is guided by using a special type of X-ray (fluoroscopy) to the blood vessel being examined.   A special dye is then injected into the catheter and X-rays are taken. The dye helps to show where any narrowing or blockages are located in the heart arteries.  AFTER THE PROCEDURE   After the procedure you will be kept in bed lying flat for several hours. You will be instructed to not bend or cross your legs.   The groin insertion site will be watched and checked frequently.   The pulse in your feet will be checked frequently.   Additional blood tests, X-rays and an EKG may be done.   You may stay in the hospital overnight for observation.  SEEK IMMEDIATE MEDICAL CARE IF:   You develop chest pain, shortness of breath, feel faint, or pass out.   There is bleeding, swelling, or drainage from the catheter insertion site.   You develop pain, discoloration, coldness, or severe bruising in the leg or area where the catheter was inserted.   You have a fever.  Document Released: 07/12/2002 Document Revised: 12/25/2010 Document Reviewed: 08/31/2007 [facility] Patient Information 2012 [place], [place].

## 2011-10-14 NOTE — Progress Notes (Signed)
 History of Present Illness: 71 yo female with history of HTN, HLD, anxiety, Barrett's esophagus who is here today for evaluation of chest pain. She tells me that she is very active. She reports tightness and pains in the center of her chest with exertion. She also notes dyspnea and fatigue over the last 6 months. She was seen by Dr. Ramaswamy yesterday and she is felt to have at least mild COPD. She has no prior cardiac workup. CT chest showed calcifications in the left main and all three major coronary arteries. Her energy level is at an all-time low.   Primary Care Physician: Keith Williams   Past Medical History  Diagnosis Date  . Hyperlipidemia   . HTN (hypertension)   . Anxiety   . Diverticulosis   . Barrett's esophagus without dysplasia   . Depression   . Rectocele     Past Surgical History  Procedure Date  . Partial hysterectomy 1980s  . Colonoscopy 01/2008    multiple diverticula in desc and sigm colon  . Esophagogastroduodenoscopy 01/2008    Barrett's esophagus, gastritis, no h.pylori, due follow-up 01/2011  . Flexible sigmoidoscopy 08/11/2010 RECTAL PAIN/PRESSURE    SML IH, TICS  . Esophagogastroduodenoscopy 04/06/2011    Procedure: ESOPHAGOGASTRODUODENOSCOPY (EGD);  Surgeon: Sandi L Fields, MD;  Location: [facility];  Service: Endoscopy;  Laterality: N/A;  8:30    Current Outpatient Prescriptions  Medication Sig Dispense Refill  . acetaminophen (TYLENOL) 500 MG tablet Take 1,000 mg by mouth every 6 (six) hours as needed. For pain      . ALPRAZolam (XANAX) 0.25 MG tablet Take 0.25 mg by mouth daily as needed. For anxiety      . atorvastatin (LIPITOR) 10 MG tablet Take 10 mg by mouth at bedtime.       . Biotin 1000 MCG tablet Take 1,000 mcg by mouth daily.       . budesonide-formoterol (SYMBICORT) 80-4.5 MCG/ACT inhaler Inhale 2 puffs into the lungs 2 (two) times daily.  1 Inhaler  12  . Calcium Carbonate-Vitamin D (CALCIUM + D) 600-200 MG-UNIT TABS Take 1 tablet by  mouth 2 (two) times daily.       . Cyanocobalamin (VITAMIN B 12 PO) Take 2,000 mg by mouth daily.       . fish oil-omega-3 fatty acids 1000 MG capsule Take 2 g by mouth daily.        . lisinopril-hydrochlorothiazide (PRINZIDE,ZESTORETIC) 20-12.5 MG per tablet Take 1 tablet by mouth daily.        . niacin 500 MG tablet Take 500 mg by mouth daily.      . omeprazole (PRILOSEC) 20 MG capsule Take 20 mg by mouth daily.        . potassium chloride SA (K-DUR,KLOR-CON) 20 MEQ tablet Take 20 mEq by mouth daily.       . venlafaxine (EFFEXOR-XR) 37.5 MG 24 hr capsule Take 37.5 mg by mouth daily.        . DISCONTD: budesonide-formoterol (SYMBICORT) 80-4.5 MCG/ACT inhaler Inhale 2 puffs into the lungs 2 (two) times daily.  1 Inhaler  12  . DISCONTD: niacin (NIASPAN) 500 MG CR tablet Take 500 mg by mouth at bedtime.          Allergies  Allergen Reactions  . Adhesive (Tape) Other (See Comments)    Turns skin red  . Sulfa Antibiotics Other (See Comments)    Childhood allergy    History   Social History  . Marital Status: Married      Spouse Name: N/A    Number of Children: 1  . Years of Education: N/A   Occupational History  . Retired hairdresser/instructor     retired  .     Social History Main Topics  . Smoking status: Former Smoker -- 1.0 packs/day for 53 years    Types: Cigarettes    Quit date: 03/20/2007  . Smokeless tobacco: Never Used  . Alcohol Use: No  . Drug Use: No  . Sexually Active: Not on file   Other Topics Concern  . Not on file   Social History Narrative  . No narrative on file    Family History  Problem Relation Age of Onset  . Coronary artery disease Father 50  . Colon cancer Neg Hx   . GI problems Mother     perforated colon   . Heart attack Father 50  . Coronary artery disease Paternal Aunt   . Coronary artery disease Paternal Uncle     Review of Systems:  As stated in the HPI and otherwise negative.   BP 135/65  Pulse 63  Ht 5' 1" (1.549 m)  Wt 130  lb (58.968 kg)  BMI 24.56 kg/m2  Physical Examination: General: Well developed, well nourished, NAD HEENT: OP clear, mucus membranes moist SKIN: warm, dry. No rashes. Neuro: No focal deficits Musculoskeletal: Muscle strength 5/5 all ext Psychiatric: Mood and affect normal Neck: No JVD, no carotid bruits, no thyromegaly, no lymphadenopathy. Lungs:Clear bilaterally, no wheezes, rhonci, crackles Cardiovascular: Regular rate and rhythm. No murmurs, gallops or rubs. Abdomen:Soft. Bowel sounds present. Non-tender.  Extremities: No lower extremity edema. Pulses are 2 + in the bilateral DP/PT.  EKG: Normal sinus, rate 63 bpm.   Assessment and Plan:   1. Chest pain: She is having exertional chest pressure and dyspnea concerning for angina. She has multiple risk factors for CAD including HTN, HLD, former tobacco abuse, age and strong FH of CAD. CT chest with coronary artery calcification including left main and all 3 major vessels. I have discussed stress testing vs cath but given her risk factors, clinical picture and CT findings, will proceed to left heart catheterization to exclude obstructive CAD. Risks and benefits reviewed with pt. She agrees to proceed. Pre-cath labs today. Scheduled for 9:30 am in outpt cath lab at [facility].  

## 2011-10-16 ENCOUNTER — Ambulatory Visit (HOSPITAL_COMMUNITY): Admit: 2011-10-16 | Payer: Self-pay | Admitting: Cardiovascular Disease

## 2011-10-16 ENCOUNTER — Inpatient Hospital Stay (HOSPITAL_BASED_OUTPATIENT_CLINIC_OR_DEPARTMENT_OTHER)
Admission: RE | Admit: 2011-10-16 | Discharge: 2011-10-16 | Disposition: A | Discharge status: Hospice | Payer: Medicare Other | Source: Ambulatory Visit | Attending: Cardiovascular Disease | Admitting: Cardiovascular Disease

## 2011-10-16 ENCOUNTER — Encounter (HOSPITAL_BASED_OUTPATIENT_CLINIC_OR_DEPARTMENT_OTHER)
Admission: RE | Disposition: A | Discharge status: Hospice | Payer: Self-pay | Source: Ambulatory Visit | Attending: Cardiovascular Disease

## 2011-10-16 ENCOUNTER — Encounter (HOSPITAL_COMMUNITY): Payer: Self-pay | Admitting: *Deleted

## 2011-10-16 ENCOUNTER — Encounter (HOSPITAL_COMMUNITY)
Admission: RE | Disposition: A | Discharge status: Home / Self Care | Payer: Self-pay | Source: Ambulatory Visit | Attending: Cardiovascular Disease

## 2011-10-16 ENCOUNTER — Ambulatory Visit (HOSPITAL_COMMUNITY)
Admission: RE | Admit: 2011-10-16 | Discharge: 2011-10-17 | Disposition: A | Discharge status: Home / Self Care | Payer: Medicare Other | Source: Ambulatory Visit | Attending: Cardiovascular Disease | Admitting: Cardiovascular Disease

## 2011-10-16 DIAGNOSIS — I25810 Atherosclerosis of coronary artery bypass graft(s) without angina pectoris: Secondary | ICD-10-CM

## 2011-10-16 DIAGNOSIS — I2510 Atherosclerotic heart disease of native coronary artery without angina pectoris: Secondary | ICD-10-CM

## 2011-10-16 DIAGNOSIS — R079 Chest pain, unspecified: Secondary | ICD-10-CM

## 2011-10-16 DIAGNOSIS — R0789 Other chest pain: Secondary | ICD-10-CM

## 2011-10-16 DIAGNOSIS — Z955 Presence of coronary angioplasty implant and graft: Secondary | ICD-10-CM

## 2011-10-16 DIAGNOSIS — I209 Angina pectoris, unspecified: Secondary | ICD-10-CM | POA: Insufficient documentation

## 2011-10-16 DIAGNOSIS — R0602 Shortness of breath: Secondary | ICD-10-CM | POA: Insufficient documentation

## 2011-10-16 DIAGNOSIS — J449 Chronic obstructive pulmonary disease, unspecified: Secondary | ICD-10-CM

## 2011-10-16 DIAGNOSIS — E785 Hyperlipidemia, unspecified: Secondary | ICD-10-CM | POA: Insufficient documentation

## 2011-10-16 DIAGNOSIS — I200 Unstable angina: Secondary | ICD-10-CM | POA: Insufficient documentation

## 2011-10-16 DIAGNOSIS — Z9189 Other specified personal risk factors, not elsewhere classified: Secondary | ICD-10-CM

## 2011-10-16 DIAGNOSIS — I10 Essential (primary) hypertension: Secondary | ICD-10-CM | POA: Insufficient documentation

## 2011-10-16 HISTORY — DX: Atherosclerotic heart disease of native coronary artery without angina pectoris: I25.10

## 2011-10-16 HISTORY — PX: FRACTIONAL FLOW RESERVE WIRE: SHX5839

## 2011-10-16 LAB — POCT ACTIVATED CLOTTING TIME: Activated Clotting Time: 469 seconds

## 2011-10-16 SURGERY — FRACTIONAL FLOW RESERVE WIRE
Anesthesia: LOCAL

## 2011-10-16 SURGERY — JV LEFT HEART CATHETERIZATION WITH CORONARY ANGIOGRAM
Anesthesia: Moderate Sedation

## 2011-10-16 MED ORDER — ADENOSINE 12 MG/4ML IV SOLN
12.0000 mL | INTRAVENOUS | Status: AC
  Administered 2011-10-16: 36 mg via INTRAVENOUS
  Filled 2011-10-16: qty 12

## 2011-10-16 MED ORDER — ACETAMINOPHEN 325 MG PO TABS: 650.0000 mg | ORAL_TABLET | ORAL | Status: DC | PRN

## 2011-10-16 MED ORDER — ASPIRIN 81 MG PO CHEW
81.0000 mg | CHEWABLE_TABLET | Freq: Every day | ORAL | Status: DC
  Administered 2011-10-17: 09:00:00 81 mg via ORAL
  Filled 2011-10-16 (×2): qty 1

## 2011-10-16 MED ORDER — SODIUM CHLORIDE 0.9 % IV SOLN: INTRAVENOUS | Status: AC

## 2011-10-16 MED ORDER — LIDOCAINE HCL (PF) 1 % IJ SOLN
INTRAMUSCULAR | Status: AC
  Filled 2011-10-16: qty 30

## 2011-10-16 MED ORDER — SODIUM CHLORIDE 0.9 % IJ SOLN: 3.0000 mL | Freq: Two times a day (BID) | INTRAMUSCULAR | Status: DC

## 2011-10-16 MED ORDER — NITROGLYCERIN 0.2 MG/ML ON CALL CATH LAB
INTRAVENOUS | Status: AC
  Filled 2011-10-16: qty 1

## 2011-10-16 MED ORDER — ASPIRIN 81 MG PO CHEW
324.0000 mg | CHEWABLE_TABLET | ORAL | Status: AC
  Administered 2011-10-16: 324 mg via ORAL

## 2011-10-16 MED ORDER — VENLAFAXINE HCL ER 37.5 MG PO CP24
37.5000 mg | ORAL_CAPSULE | Freq: Every day | ORAL | Status: DC
  Administered 2011-10-16: 37.5 mg via ORAL
  Filled 2011-10-16 (×2): qty 1

## 2011-10-16 MED ORDER — BUDESONIDE-FORMOTEROL FUMARATE 80-4.5 MCG/ACT IN AERO
2.0000 | INHALATION_SPRAY | Freq: Two times a day (BID) | RESPIRATORY_TRACT | Status: DC
  Administered 2011-10-16: 21:00:00 2 via RESPIRATORY_TRACT
  Filled 2011-10-16: qty 6.9

## 2011-10-16 MED ORDER — SODIUM CHLORIDE 0.9 % IV SOLN
250.0000 mL | INTRAVENOUS | Status: DC | PRN
Start: 2011-10-16 — End: 2011-10-16

## 2011-10-16 MED ORDER — ONDANSETRON HCL 4 MG/2ML IJ SOLN
4.0000 mg | Freq: Four times a day (QID) | INTRAMUSCULAR | Status: DC | PRN
  Administered 2011-10-16: 4 mg via INTRAVENOUS
  Filled 2011-10-16: qty 2

## 2011-10-16 MED ORDER — FENTANYL CITRATE 0.05 MG/ML IJ SOLN
INTRAMUSCULAR | Status: AC
  Filled 2011-10-16: qty 2

## 2011-10-16 MED ORDER — FENTANYL CITRATE 0.05 MG/ML IJ SOLN
50.0000 ug | Freq: Once | INTRAMUSCULAR | Status: AC
  Administered 2011-10-16: 50 ug via INTRAVENOUS

## 2011-10-16 MED ORDER — HEPARIN (PORCINE) IN NACL 2-0.9 UNIT/ML-% IJ SOLN
INTRAMUSCULAR | Status: AC
  Filled 2011-10-16: qty 1000

## 2011-10-16 MED ORDER — ATORVASTATIN CALCIUM 10 MG PO TABS
10.0000 mg | ORAL_TABLET | Freq: Every day | ORAL | Status: DC
  Administered 2011-10-16: 10 mg via ORAL
  Filled 2011-10-16 (×2): qty 1

## 2011-10-16 MED ORDER — MIDAZOLAM HCL 2 MG/2ML IJ SOLN
INTRAMUSCULAR | Status: AC
  Filled 2011-10-16: qty 4

## 2011-10-16 MED ORDER — BIVALIRUDIN 250 MG IV SOLR
INTRAVENOUS | Status: AC
  Filled 2011-10-16: qty 250

## 2011-10-16 MED ORDER — HEPARIN (PORCINE) IN NACL 100-0.45 UNIT/ML-% IJ SOLN
700.0000 [IU]/h | INTRAMUSCULAR | Status: DC
  Administered 2011-10-16: 700 [IU]/h via INTRAVENOUS
  Filled 2011-10-16: qty 250

## 2011-10-16 MED ORDER — SODIUM CHLORIDE 0.9 % IV SOLN
INTRAVENOUS | Status: DC
  Administered 2011-10-16: 09:00:00 via INTRAVENOUS

## 2011-10-16 MED ORDER — SODIUM CHLORIDE 0.9 % IJ SOLN: 3.0000 mL | INTRAMUSCULAR | Status: DC | PRN

## 2011-10-16 MED ORDER — LISINOPRIL 20 MG PO TABS
20.0000 mg | ORAL_TABLET | Freq: Every day | ORAL | Status: DC
  Administered 2011-10-16: 20 mg via ORAL
  Filled 2011-10-16 (×2): qty 1

## 2011-10-16 MED ORDER — HEPARIN BOLUS VIA INFUSION
3500.0000 [IU] | Freq: Once | INTRAVENOUS | Status: AC
  Administered 2011-10-16: 3500 [IU] via INTRAVENOUS

## 2011-10-16 MED ORDER — MIDAZOLAM HCL 2 MG/2ML IJ SOLN
2.0000 mg | Freq: Once | INTRAMUSCULAR | Status: AC
  Administered 2011-10-16: 2 mg via INTRAVENOUS

## 2011-10-16 MED ORDER — CLOPIDOGREL BISULFATE 75 MG PO TABS
75.0000 mg | ORAL_TABLET | Freq: Every day | ORAL | Status: DC
  Administered 2011-10-17: 75 mg via ORAL
  Filled 2011-10-16: qty 1

## 2011-10-16 MED ORDER — OXYCODONE-ACETAMINOPHEN 5-325 MG PO TABS: 1.0000 | ORAL_TABLET | ORAL | Status: DC | PRN

## 2011-10-16 MED ORDER — HYDROCHLOROTHIAZIDE 12.5 MG PO CAPS
12.5000 mg | ORAL_CAPSULE | Freq: Every day | ORAL | Status: DC
  Administered 2011-10-16: 12.5 mg via ORAL
  Filled 2011-10-16 (×2): qty 1

## 2011-10-16 MED ORDER — CLOPIDOGREL BISULFATE 300 MG PO TABS
ORAL_TABLET | ORAL | Status: AC
  Filled 2011-10-16: qty 2

## 2011-10-16 MED ORDER — MORPHINE SULFATE 2 MG/ML IJ SOLN: 2.0000 mg | INTRAMUSCULAR | Status: DC | PRN

## 2011-10-16 MED ORDER — MIDAZOLAM HCL 2 MG/2ML IJ SOLN
INTRAMUSCULAR | Status: AC
  Filled 2011-10-16: qty 2

## 2011-10-16 MED ORDER — DIAZEPAM 5 MG PO TABS
5.0000 mg | ORAL_TABLET | ORAL | Status: AC
  Administered 2011-10-16: 5 mg via ORAL

## 2011-10-16 MED ORDER — LISINOPRIL-HYDROCHLOROTHIAZIDE 20-12.5 MG PO TABS: 1.0000 | ORAL_TABLET | Freq: Every day | ORAL | Status: DC

## 2011-10-16 NOTE — CV Procedure (Signed)
   Cardiac Catheterization Operative Report  Rhonda Hughes [id number] 9/27/20136:10 PM Geoffery Lyons, MD  Procedure Performed:  1. PTCA/DES x 2 mid LAD 2. FFR first obtuse marginal branch of the Circumflex    Operator: Verne Carrow, MD  Indication:  Unstable angina, diagnostic cath this am in outpatient cath lab with severe stenosis LAD, moderate stenosis OM1.                                  Procedure Details: The risks, benefits, complications, treatment options, and expected outcomes were discussed with the patient. The patient and/or family concurred with the proposed plan, giving informed consent. The patient was brought to the inpatient cath lab after IV hydration was begun and oral premedication was given. The patient was further sedated with Versed and Fentanyl. The right groin had a 4 French sheath present in the right femoral artery. The right groin was prepped and draped in the usual manner. I exchanged the sheath for a 6 [place] system. I then engaged the left main with a 6 [place] XB LAD 3.5 guiding catheter. She was loaded with 600 mg Plavix po x 1. She was given a bolus of Angiomax and a drip was started. When the ACT was greater than 200, I passed a flow/pressure wire down the Circumflex into the OM1. Baseline FFR 0.98. With infusion of IV adenosine, the FFR was 0.89, suggesting the moderate stenosis in the OM branch was not flow limiting. I then turned my attention to the severe stenosis in the mid LAD. A Cougar IC wire was advanced down the LAD. There was some difficulty crossing the stenosis due to the severity of the lesion. I then pre-dilated with a 2.0 x 8 mm balloon x 2. I then deployed a 2.5 x 24 mm Promus Element in the mid LAD. There was some difficulty advancing the stent due to vessel calcification. I post-dilated the stent with a 2.75 x 20 mm [place] balloon x 2. After stent deployment, there was plaque shift proximal to the stent. This area likely had some  component of vasospasm but after 20 minutes and multiple rounds of NTG IC, there was no resolution in the stenosis just prior to the stent. I felt that this area should be covered with another stent. I deployed a 2.75 x 8 mm Promus Element DES in an overlapping fashion on the proximal edge of the more distal stent. This was post-dilated with a 3.0 x 8 mm [place] balloon x 2. There was an excellent result. The stenosis was taken from 90% down to 0%.   There were no immediate complications. The patient was taken to the recovery area in stable condition.   Hemodynamic Findings: Central aortic pressure: 121/56  Impression: 1. Unstable angina 2. Successful PTCA/DES x 2 mid LAD 3. Moderate stenosis OM1, FFR of 0.89.   Recommendations: She will need ASA and Plavix for one year. Will continue statin.         Complications:  None; patient tolerated the procedure well.

## 2011-10-16 NOTE — H&P (View-Only) (Signed)
History of Present Illness: 71 yo female with history of HTN, HLD, anxiety, Barrett's esophagus who is here today for evaluation of chest pain. She tells me that she is very active. She reports tightness and pains in the center of her chest with exertion. She also notes dyspnea and fatigue over the last 6 months. She was seen by Dr. Marchelle Gearing yesterday and she is felt to have at least mild COPD. She has no prior cardiac workup. CT chest showed calcifications in the left main and all three major coronary arteries. Her energy level is at an all-time low.   Primary Care Physician: Rance Muir   Past Medical History  Diagnosis Date  . Hyperlipidemia   . HTN (hypertension)   . Anxiety   . Diverticulosis   . Barrett's esophagus without dysplasia   . Depression   . Rectocele     Past Surgical History  Procedure Date  . Partial hysterectomy 1980s  . Colonoscopy 01/2008    multiple diverticula in desc and sigm colon  . Esophagogastroduodenoscopy 01/2008    Barrett's esophagus, gastritis, no h.pylori, due follow-up 01/2011  . Flexible sigmoidoscopy 08/11/2010 RECTAL PAIN/PRESSURE    SML IH, TICS  . Esophagogastroduodenoscopy 04/06/2011    Procedure: ESOPHAGOGASTRODUODENOSCOPY (EGD);  Surgeon: West Bali, MD;  Location: [facility];  Service: Endoscopy;  Laterality: N/A;  8:30    Current Outpatient Prescriptions  Medication Sig Dispense Refill  . acetaminophen (TYLENOL) 500 MG tablet Take 1,000 mg by mouth every 6 (six) hours as needed. For pain      . ALPRAZolam (XANAX) 0.25 MG tablet Take 0.25 mg by mouth daily as needed. For anxiety      . atorvastatin (LIPITOR) 10 MG tablet Take 10 mg by mouth at bedtime.       . Biotin 1000 MCG tablet Take 1,000 mcg by mouth daily.       . budesonide-formoterol (SYMBICORT) 80-4.5 MCG/ACT inhaler Inhale 2 puffs into the lungs 2 (two) times daily.  1 Inhaler  12  . Calcium Carbonate-Vitamin D (CALCIUM + D) 600-200 MG-UNIT TABS Take 1 tablet by  mouth 2 (two) times daily.       . Cyanocobalamin (VITAMIN B 12 PO) Take 2,000 mg by mouth daily.       . fish oil-omega-3 fatty acids 1000 MG capsule Take 2 g by mouth daily.        Marland Kitchen lisinopril-hydrochlorothiazide (PRINZIDE,ZESTORETIC) 20-12.5 MG per tablet Take 1 tablet by mouth daily.        . niacin 500 MG tablet Take 500 mg by mouth daily.      Marland Kitchen omeprazole (PRILOSEC) 20 MG capsule Take 20 mg by mouth daily.        . potassium chloride SA (K-DUR,KLOR-CON) 20 MEQ tablet Take 20 mEq by mouth daily.       Marland Kitchen venlafaxine (EFFEXOR-XR) 37.5 MG 24 hr capsule Take 37.5 mg by mouth daily.        Marland Kitchen DISCONTD: budesonide-formoterol (SYMBICORT) 80-4.5 MCG/ACT inhaler Inhale 2 puffs into the lungs 2 (two) times daily.  1 Inhaler  12  . DISCONTD: niacin (NIASPAN) 500 MG CR tablet Take 500 mg by mouth at bedtime.          Allergies  Allergen Reactions  . Adhesive (Tape) Other (See Comments)    Turns skin red  . Sulfa Antibiotics Other (See Comments)    Childhood allergy    History   Social History  . Marital Status: Married  Spouse Name: N/A    Number of Children: 1  . Years of Education: N/A   Occupational History  . Retired Personal assistant     retired  .     Social History Main Topics  . Smoking status: Former Smoker -- 1.0 packs/day for 53 years    Types: Cigarettes    Quit date: 03/20/2007  . Smokeless tobacco: Never Used  . Alcohol Use: No  . Drug Use: No  . Sexually Active: Not on file   Other Topics Concern  . Not on file   Social History Narrative  . No narrative on file    Family History  Problem Relation Age of Onset  . Coronary artery disease Father 58  . Colon cancer Neg Hx   . GI problems Mother     perforated colon   . Heart attack Father 33  . Coronary artery disease Paternal Aunt   . Coronary artery disease Paternal Uncle     Review of Systems:  As stated in the HPI and otherwise negative.   BP 135/65  Pulse 63  Ht 5\' 1"  (1.549 m)  Wt 130  lb (58.968 kg)  BMI 24.56 kg/m2  Physical Examination: General: Well developed, well nourished, NAD HEENT: OP clear, mucus membranes moist SKIN: warm, dry. No rashes. Neuro: No focal deficits Musculoskeletal: Muscle strength 5/5 all ext Psychiatric: Mood and affect normal Neck: No JVD, no carotid bruits, no thyromegaly, no lymphadenopathy. Lungs:Clear bilaterally, no wheezes, rhonci, crackles Cardiovascular: Regular rate and rhythm. No murmurs, gallops or rubs. Abdomen:Soft. Bowel sounds present. Non-tender.  Extremities: No lower extremity edema. Pulses are 2 + in the bilateral DP/PT.  EKG: Normal sinus, rate 63 bpm.   Assessment and Plan:   1. Chest pain: She is having exertional chest pressure and dyspnea concerning for angina. She has multiple risk factors for CAD including HTN, HLD, former tobacco abuse, age and strong FH of CAD. CT chest with coronary artery calcification including left main and all 3 major vessels. I have discussed stress testing vs cath but given her risk factors, clinical picture and CT findings, will proceed to left heart catheterization to exclude obstructive CAD. Risks and benefits reviewed with pt. She agrees to proceed. Pre-cath labs today. Scheduled for 9:30 am in outpt cath lab at [facility].

## 2011-10-16 NOTE — Progress Notes (Signed)
Voided 500cc of clear yellow urine.  To Cath Lab 5 via stretcher.  Heparin drip discontinued.  Family at bedside.

## 2011-10-16 NOTE — Interval H&P Note (Signed)
History and Physical Interval Note:  10/16/2011 9:34 AM  Rhonda Hughes  has presented today for cardiac cath  with the diagnosis of chest pain  The various methods of treatment have been discussed with the patient and family. After consideration of risks, benefits and other options for treatment, the patient has consented to  Procedure(s) (LRB) with comments: JV LEFT HEART CATHETERIZATION WITH CORONARY ANGIOGRAM (N/A) as a surgical intervention .  The patient's history has been reviewed, patient examined, no change in status, stable for surgery.  I have reviewed the patient's chart and labs.  Questions were answered to the patient's satisfaction.     Joby Richart

## 2011-10-16 NOTE — CV Procedure (Addendum)
   Cardiac Catheterization Operative Report  Rhonda Hughes [id number] 9/27/201310:13 AM Geoffery Lyons, MD  Procedure Performed:  1. Left Heart Catheterization 2. Selective Coronary Angiography 3. Left ventricular angiogram  Operator: Verne Carrow, MD  Indication: Exertional chest pressure, SOB c/w unstable angina. Coronary calcification on CT scan.                                        Procedure Details: The risks, benefits, complications, treatment options, and expected outcomes were discussed with the patient. The patient and/or family concurred with the proposed plan, giving informed consent. The patient was brought to the cath lab after IV hydration was begun and oral premedication was given. The patient was further sedated with Versed and Fentanyl. The right groin was prepped and draped in the usual manner. Using the modified Seldinger access technique, a 4 French sheath was placed in the right femoral artery. Standard diagnostic catheters were used to perform selective coronary angiography. A pigtail catheter was used to perform a left ventricular angiogram.  There were no immediate complications. The patient was taken to the recovery area in stable condition.   Hemodynamic Findings: Central aortic pressure: 129/57 Left ventricular pressure: 129/13/19  Angiographic Findings:  Left main: The distal vessel has 20% stenosis.   Left Anterior Descending Artery:Moderate sized vessel that courses to the apex. The proximal vessel has diffuse plaque. There is calcification in the proximal and mid vessel.  The mid vessel has a 90% stenosis. The diagonal branch is very small.   Circumflex Artery: Moderate sized vessel with moderate sized obtuse marginal branch. The first obtuse marginal branch has 30% proximal stenosis and 70% mid stenosis. The AV groove Circumflex is small in caliber beyond the takeoff of the marginal branch.   Right Coronary Artery: Moderate sized  dominant vessel with diffuse 30% stenosis in the proximal and mid vessel. No flow limiting lesions noted.   Left Ventricular Angiogram: LVEF=65%  Impression: 1. 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  2. Preserved LV systolic function   Recommendations: Will move to inpatient cath lab today and perform FFR of the Circumflex and PCI of the LAD. If the Circumflex is flow limiting, will perform PCI of the Circumflex as well.        Complications:  None. The patient tolerated the procedure well.

## 2011-10-16 NOTE — Progress Notes (Signed)
Site area: right groin  Site Prior to Removal:  Level 0  Pressure Applied For 20 MINUTES    Minutes Beginning at 20:20  Manual:   yes  Patient Status During Pull:  WNL  Post Pull Groin Site:  Level 0  Post Pull Instructions Given:  yes  Post Pull Pulses Present:  yes  Dressing Applied:  yes  Comments:

## 2011-10-16 NOTE — Progress Notes (Signed)
ANTICOAGULATION CONSULT NOTE - Initial Consult  Pharmacy Consult for Heparin Indication: chest pain/ACS  Allergies  Allergen Reactions  . Adhesive (Tape) Other (See Comments)    Turns skin red  . Sulfa Antibiotics Other (See Comments)    Childhood allergy    Patient Measurements: Height: 5\' 1"  (154.9 cm) Weight: 130 lb (58.968 kg) IBW/kg (Calculated) : 47.8  Heparin Dosing Weight: 59 kg  Vital Signs: BP: 119/75 mmHg (09/27 1214) Pulse Rate: 62  (09/27 1214)  Labs:  Basename 10/14/11 1158  HGB 13.1  HCT 39.2  PLT 293.0  APTT --  LABPROT 10.7  INR 1.0  HEPARINUNFRC --  CREATININE 0.9  CKTOTAL --  CKMB --  TROPONINI --    Estimated Creatinine Clearance: 48.7 ml/min (by C-G formula based on Cr of 0.9).   Medical History: Past Medical History  Diagnosis Date  . Hyperlipidemia   . HTN (hypertension)   . Anxiety   . Diverticulosis   . Barrett's esophagus without dysplasia   . Depression   . Rectocele     Assessment: 71 yo female with chest pain s/p LHC today to start heparin. Noted plans for FFR today and potential PCI. Labs from 10/14/11 WNL: SCr 0.9; CBC wnl (Hgb 13.1, plt 293), INR 1.0.   Goal of Therapy:  Heparin level 0.3-0.7 units/ml Monitor platelets by anticoagulation protocol: Yes   Plan:  1. Heparin bolus 3500 units x1, then start heparin infusion at 700 units/hr (=7 ml/hr) 2. Heparin level in 8h at 2100 3. Daily heparin level and CBC  Crist Fat L 10/16/2011,12:20 PM

## 2011-10-17 ENCOUNTER — Encounter (HOSPITAL_COMMUNITY): Payer: Self-pay | Admitting: Physician Assistant

## 2011-10-17 DIAGNOSIS — R0789 Other chest pain: Secondary | ICD-10-CM

## 2011-10-17 LAB — CBC
Hemoglobin: 11.9 g/dL — ABNORMAL LOW (ref 12.0–15.0)
Platelets: 248 10*3/uL (ref 150–400)
RBC: 3.74 MIL/uL — ABNORMAL LOW (ref 3.87–5.11)
WBC: 11.2 10*3/uL — ABNORMAL HIGH (ref 4.0–10.5)

## 2011-10-17 LAB — BASIC METABOLIC PANEL
CO2: 30 mEq/L (ref 19–32)
Chloride: 99 mEq/L (ref 96–112)
Potassium: 3.5 mEq/L (ref 3.5–5.1)
Sodium: 139 mEq/L (ref 135–145)

## 2011-10-17 MED ORDER — PANTOPRAZOLE SODIUM 40 MG PO TBEC: 40.0000 mg | DELAYED_RELEASE_TABLET | Freq: Every day | ORAL | Status: DC

## 2011-10-17 MED ORDER — CLOPIDOGREL BISULFATE 75 MG PO TABS: 75.0000 mg | ORAL_TABLET | Freq: Every day | ORAL | Status: DC

## 2011-10-17 MED ORDER — NITROGLYCERIN 0.4 MG SL SUBL: 0.4000 mg | SUBLINGUAL_TABLET | SUBLINGUAL | Status: DC | PRN

## 2011-10-17 MED ORDER — ASPIRIN 81 MG PO CHEW: 81.0000 mg | CHEWABLE_TABLET | Freq: Every day | ORAL | Status: DC

## 2011-10-17 NOTE — Discharge Summary (Signed)
Discharge Summary   Patient ID: Rhonda Hughes                                                          MRN: [id number], DOB/AGE: Apr 28, 1940 71 y.o.                 Admit date: 10/16/2011                 D/C date:     10/17/2011    Primary Care Physician:  Geoffery Lyons, MD  Primary Cardiologist:  Dr. Verne Carrow  Primary Electrophysiologist:  None   Reason for Admission:  Chest Pain  Primary Discharge Diagnoses:  1. Coronary Artery Disease     -  S/p Promus DES x 2 to mid LAD  Secondary Discharge Diagnoses:   Past Medical History  Diagnosis Date  . Hyperlipidemia   . HTN (hypertension)   . Anxiety   . Diverticulosis   . Barrett's esophagus without dysplasia   . Depression   . Rectocele   . CAD (coronary artery disease)     a. LHC 10/16/11: dLM 20%, mLAD 90%, pOM1 30%, mOM1 70% (FFR not hemodynamically significant), prox and mid RCA 30%, EF 65%;  b. PCI 10/16/11: Promus DES x 2 to mLAD     Procedures Performed This Admission:    Cardiac Catheterization 10/16/2011:  Angiographic Findings:  Left main: The distal vessel has 20% stenosis.  Left Anterior Descending Artery:Moderate sized vessel that courses to the apex. The proximal vessel has diffuse plaque. There is calcification in the proximal and mid vessel. The mid vessel has a 90% stenosis. The diagonal branch is very small.  Circumflex Artery: Moderate sized vessel with moderate sized obtuse marginal branch. The first obtuse marginal branch has 30% proximal stenosis and 70% mid stenosis. The AV groove Circumflex is small in caliber beyond the takeoff of the marginal branch.  Right Coronary Artery: Moderate sized dominant vessel with diffuse 30% stenosis in the proximal and mid vessel. No flow limiting lesions noted.  Left Ventricular Angiogram: LVEF=65%   Impression:  1. 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  2. Preserved LV systolic function   Recommendations: Will move to inpatient cath lab today and  perform FFR of the Circumflex and PCI of the LAD. If the Circumflex is flow limiting, will perform PCI of the Circumflex as well.   FFR:   OM1 - Baseline FFR 0.98. With infusion of IV adenosine, the FFR was 0.89, suggesting the moderate stenosis in the OM branch was not flow limiting   Percutaneous Coronary Intervention:  Successful PTCA/DES x 2 mid LAD with Promus Element (2.5 x 24 mm; 2.75 x 8 mm)   Recommendations: She will need ASA and Plavix for one year. Will continue statin.     Hospital Course: Rhonda Hughes is a 70 y.o. female with a history of HTN, HL, anxiety and Barrett's esophagus and probable mild COPD who presented to the office on 10/14/11 for evaluation of chest pain. She was felt to have significant risk factors and symptoms consistent with angina. Of note, she also had calcifications in the left main and all 3 major coronary arteries on recent chest CT. Diagnostic cardiac catheterization was arranged and performed on 10/16/11. This demonstrated high-grade stenosis in the mid  LAD and at least moderate to severe stenosis in the OM. FFR of the OM demonstrated that the lesion was not hemodynamically significant. Therefore, this was treated medically. She underwent overlapping DES x2 to the mid LAD. She tolerated the procedure well and had no immediate complications. She was evaluated by Dr. Eden Emms on the morning of discharge.  She was doing well without chest pain and was felt to be stable for discharge to home.   Discharge Vitals: Blood pressure 111/40, pulse 65, temperature 97.8 F (36.6 C), temperature source Oral, resp. rate 23, height 5\' 1"  (1.549 m), weight 129 lb 13.6 oz (58.9 kg), SpO2 95.00%.  Labs:   CBC  Basename 10/17/11 0719 10/14/11 1158  WBC 11.2* 8.8  NEUTROABS -- 5.7  HGB 11.9* 13.1  HCT 35.1* 39.2  MCV 93.9 95.7  PLT 248 293.0   Basic Metabolic Panel  Basename 10/17/11 0719 10/14/11 1158  NA 139 137  K 3.5 3.1*  CL 99 97  CO2 30 30  GLUCOSE  100* 91  BUN 8 8  CREATININE 0.92 0.9  CALCIUM 9.3 9.3  MG -- --  PHOS -- --     Discharge Medications     Medication List     As of 10/17/2011  9:04 AM    STOP taking these medications         omeprazole 20 MG capsule   Commonly known as: PRILOSEC      TAKE these medications         ALPRAZolam 0.25 MG tablet   Commonly known as: XANAX   Take 0.25 mg by mouth daily as needed. For anxiety      aspirin 81 MG chewable tablet   Chew 1 tablet (81 mg total) by mouth daily.      atorvastatin 10 MG tablet   Commonly known as: LIPITOR   Take 10 mg by mouth at bedtime.      Biotin 1000 MCG tablet   Take 1,000 mcg by mouth daily.      budesonide-formoterol 80-4.5 MCG/ACT inhaler   Commonly known as: SYMBICORT   Inhale 2 puffs into the lungs 2 (two) times daily.      Calcium + D 600-200 MG-UNIT Tabs   Generic drug: Calcium Carbonate-Vitamin D   Take 1 tablet by mouth 2 (two) times daily.      clopidogrel 75 MG tablet   Commonly known as: PLAVIX   Take 1 tablet (75 mg total) by mouth daily with breakfast.      lisinopril-hydrochlorothiazide 20-12.5 MG per tablet   Commonly known as: PRINZIDE,ZESTORETIC   Take 1 tablet by mouth at bedtime.      niacin 500 MG tablet   Take 500 mg by mouth daily.      nitroGLYCERIN 0.4 MG SL tablet   Commonly known as: NITROSTAT   Place 1 tablet (0.4 mg total) under the tongue every 5 (five) minutes as needed for chest pain.      omega-3 acid ethyl esters 1 G capsule   Commonly known as: LOVAZA   Take 2 g by mouth daily.      pantoprazole 40 MG tablet   Commonly known as: PROTONIX   Take 1 tablet (40 mg total) by mouth daily.      potassium chloride SA 20 MEQ tablet   Commonly known as: K-DUR,KLOR-CON   Take 20 mEq by mouth daily.      venlafaxine XR 37.5 MG 24 hr capsule   Commonly known as:  EFFEXOR-XR   Take 37.5 mg by mouth at bedtime.      vitamin B-12 1000 MCG tablet   Commonly known as: CYANOCOBALAMIN   Take 2,000  mcg by mouth daily.         Disposition   The patient will be discharged in stable condition to home. Discharge Orders    Future Appointments: Provider: Department: Dept Phone: Center:   11/12/2011 12:00 PM Kathleene Hazel, MD [facility] [phone] [facility]   12/14/2011 1:00 PM [facility] [phone] None   12/14/2011 2:00 PM Kalman Shan, MD [facility] [phone] None     Future Orders Please Complete By Expires   Amb Referral to Cardiac Rehabilitation      Comments:   Pt request to have referral sent to [facility] in [place].   Diet - low sodium heart healthy      Increase activity slowly      Driving Restrictions      Comments:   None for 1 week.   Lifting restrictions      Comments:   No lifting over 10 lbs for 2 weeks.   Sexual Activity Restrictions      Comments:   None for 2 weeks.   Discharge wound care:      Comments:   Call if you have any swelling, bleeding, bruising or fever.     Follow-up Information    Follow up with [facility], MD. On 11/12/2011. (Keep scheduled appointment)    Contact information:   [facility] [address] [place] [address] [phone]            Duration of Discharge Encounter: Greater than 30 minutes including physician and PA time.  Signed, Tereso Newcomer, PA-C  9:04 AM 10/17/2011        [address] [place], [place]  [address] Phone: [phone] Fax:  [phone]

## 2011-10-17 NOTE — Progress Notes (Signed)
Patient ID: Rhonda Hughes, female   DOB: [birth year], 71 y.o.   MRN: [id number]    Subjective:  Denies SSCP, palpitations or Dyspnea Needs nitro  Objective:  Filed Vitals:   10/17/11 0200 10/17/11 0300 10/17/11 0400 10/17/11 0500  BP: 90/40 100/41 93/35 92/41   Pulse: 71 67 65   Temp:   98.2 F (36.8 C)   TempSrc:   Oral   Resp: 19 18 20 21   Height:      Weight:      SpO2: 94% 95% 95%     Intake/Output from previous day:  Intake/Output Summary (Last 24 hours) at 10/17/11 0806 Last data filed at 10/17/11 0500  Gross per 24 hour  Intake    650 ml  Output   1900 ml  Net  -1250 ml    Physical Exam: Affect appropriate Healthy:  appears stated age HEENT: normal Neck supple with no adenopathy JVP normal no bruits no thyromegaly Lungs clear with no wheezing and good diaphragmatic motion Heart:  S1/S2 no murmur, no rub, gallop or click PMI normal Abdomen: benighn, BS positve, no tenderness, no AAA no bruit.  No HSM or HJR Distal pulses intact with no bruits No edema Neuro non-focal Skin warm and dry No muscular weakness Right groin looks good   Lab Results: Basic Metabolic Panel:  Basename 10/14/11 1158  NA 137  K 3.1*  CL 97  CO2 30  GLUCOSE 91  BUN 8  CREATININE 0.9  CALCIUM 9.3  MG --  PHOS --   Liver Function Tests: No results found for this basename: AST:2,ALT:2,ALKPHOS:2,BILITOT:2,PROT:2,ALBUMIN:2 in the last 72 hours No results found for this basename: LIPASE:2,AMYLASE:2 in the last 72 hours CBC:  Basename 10/17/11 0719 10/14/11 1158  WBC 11.2* 8.8  NEUTROABS -- 5.7  HGB 11.9* 13.1  HCT 35.1* 39.2  MCV 93.9 95.7  PLT 248 293.0   Cardiac Enzymes: No results found for this basename: CKTOTAL:3,CKMB:3,CKMBINDEX:3,TROPONINI:3 in the last 72 hours BNP: No components found with this basename: POCBNP:3 D-Dimer: No results found for this basename: DDIMER:2 in the last 72 hours Hemoglobin A1C: No results found for this basename: HGBA1C in the  last 72 hours Fasting Lipid Panel: No results found for this basename: CHOL,HDL,LDLCALC,TRIG,CHOLHDL,LDLDIRECT in the last 72 hours Thyroid Function Tests: No results found for this basename: TSH,T4TOTAL,FREET3,T3FREE,THYROIDAB in the last 72 hours Anemia Panel: No results found for this basename: VITAMINB12,FOLATE,FERRITIN,TIBC,IRON,RETICCTPCT in the last 72 hours  Imaging: No results found.  Cardiac Studies:  ECG:    Telemetry:  NSR no arrhythmia 10/17/2011   Echo:   Medications:     . adenosine  12 mL Intravenous To Cath  . adenosine  12 mL Intravenous To Cath  . adenosine  12 mL Intravenous To Cath  . aspirin  81 mg Oral Daily  . atorvastatin  10 mg Oral QHS  . bivalirudin      . budesonide-formoterol  2 puff Inhalation BID  . clopidogrel      . clopidogrel  75 mg Oral Q breakfast  . fentaNYL      . heparin      . hydrochlorothiazide  12.5 mg Oral Daily  . lidocaine      . lisinopril  20 mg Oral Daily  . midazolam      . midazolam      . nitroGLYCERIN      . venlafaxine XR  37.5 mg Oral Daily  . DISCONTD: lisinopril-hydrochlorothiazide  1 tablet Oral Daily       .  sodium chloride 75 mL/hr at 10/16/11 2200    Assessment/Plan:  CAD:  S/P stent LAD  OM1 not hemodynamically significant by FFR.  Continue ASA and Plavix  Already has F/U appt with CM.  Nitro to be called in.   Chol:  Continue statin  D/C home  Charlton Haws 10/17/2011, 8:06 AM

## 2011-10-17 NOTE — Progress Notes (Signed)
CARDIAC REHAB PHASE I   PRE:  Rate/Rhythm: 82 sinus  BP:  Supine: 104/34     MODE:  Ambulation: 600 ft   POST:  Rate/Rhythem: 102 sinus tach  BP:  Sitting: 121/49   SaO2: 91 RA  Order received and appreciated.  Chart reviewed.  Pt ambulated 600 ft with assist x1 without c/o.  Pt tolerated walk well and was returned to bed with call bell in reach.  Reviewed pt d/c education and Cardiac Rehab Phase II.  Pt would like CRPhII info sent to [facility] to think about it.  Pt and husband voiced understanding. Fabio Pierce, MA, ACSM RCEP [phone]  Hazle Nordmann

## 2011-10-19 MED FILL — Dextrose Inj 5%: INTRAVENOUS | Qty: 50 | Status: AC

## 2011-10-20 ENCOUNTER — Emergency Department (HOSPITAL_COMMUNITY)
Admission: EM | Admit: 2011-10-20 | Discharge: 2011-10-21 | Disposition: A | Discharge status: Home / Self Care | Payer: Medicare Other | Attending: Emergency Medicine | Admitting: Emergency Medicine

## 2011-10-20 ENCOUNTER — Emergency Department (HOSPITAL_COMMUNITY): Payer: Medicare Other

## 2011-10-20 ENCOUNTER — Telehealth: Payer: Self-pay | Admitting: Cardiovascular Disease

## 2011-10-20 DIAGNOSIS — R0602 Shortness of breath: Secondary | ICD-10-CM | POA: Insufficient documentation

## 2011-10-20 DIAGNOSIS — R0781 Pleurodynia: Secondary | ICD-10-CM

## 2011-10-20 DIAGNOSIS — I10 Essential (primary) hypertension: Secondary | ICD-10-CM | POA: Insufficient documentation

## 2011-10-20 DIAGNOSIS — E785 Hyperlipidemia, unspecified: Secondary | ICD-10-CM | POA: Insufficient documentation

## 2011-10-20 DIAGNOSIS — R071 Chest pain on breathing: Secondary | ICD-10-CM | POA: Insufficient documentation

## 2011-10-20 LAB — TROPONIN I: Troponin I: <0.3 ng/mL (ref <0.30)

## 2011-10-20 LAB — CBC WITH DIFFERENTIAL/PLATELET
Basophils Absolute: 0 10*3/uL (ref 0.0–0.1)
Basophils Relative: 0 % (ref 0–1)
Hemoglobin: 12.6 g/dL (ref 12.0–15.0)
MCHC: 34.2 g/dL (ref 30.0–36.0)
Neutro Abs: 7.2 10*3/uL (ref 1.7–7.7)
Neutrophils Relative %: 66 % (ref 43–77)
RDW: 12.8 % (ref 11.5–15.5)

## 2011-10-20 LAB — COMPREHENSIVE METABOLIC PANEL
ALT: 18 U/L (ref 0–35)
Albumin: 4.1 g/dL (ref 3.5–5.2)
Alkaline Phosphatase: 87 U/L (ref 39–117)
Potassium: 3.2 mEq/L — ABNORMAL LOW (ref 3.5–5.1)
Sodium: 133 mEq/L — ABNORMAL LOW (ref 135–145)
Total Protein: 8.2 g/dL (ref 6.0–8.3)

## 2011-10-20 MED ORDER — MORPHINE SULFATE 4 MG/ML IJ SOLN
4.0000 mg | Freq: Once | INTRAMUSCULAR | Status: AC
  Administered 2011-10-20: 4 mg via INTRAVENOUS
  Filled 2011-10-20: qty 1

## 2011-10-20 MED ORDER — KETOROLAC TROMETHAMINE 30 MG/ML IJ SOLN
30.0000 mg | Freq: Once | INTRAMUSCULAR | Status: AC
  Administered 2011-10-20: 30 mg via INTRAVENOUS
  Filled 2011-10-20: qty 1

## 2011-10-20 NOTE — Telephone Encounter (Addendum)
Returned call to patient and explained i didn't see anything to explain discomfort in c cath notes--pt did state her husband remembers dr Clifton James telling them she mite have some discomfort for a few days --advised i was unable to get ahold of dr Clifton James, and if she felt she needed to be checked we could bring her in to see another doctor, but pt states she didn't want to do that --advised i would forward note to dr Clifton James and will call her 10/2 to check on her--pt agrees--also advised to go nearest ED if condition worsens--pt agrees

## 2011-10-20 NOTE — Telephone Encounter (Signed)
Pt calling stating some discomfort since c cath --states is not chest pain, but more a physical pain "like something is pulling" in my chest--advised i would notify dr Clifton James and see if he can explain reason for discomfort

## 2011-10-20 NOTE — ED Provider Notes (Signed)
History   This chart was scribed for Rhonda Co, MD by Gerlean Ren. This patient was seen in room [facility] and the patient's care was started at 11:17PM.   CSN: [id number]  Arrival date & time 10/20/11  2229   First MD Initiated Contact with Patient 10/20/11 2306      Chief Complaint  Patient presents with  . Chest Pain    (Consider location/radiation/quality/duration/timing/severity/associated sxs/prior treatment) Patient is a 71 y.o. female presenting with chest pain. The history is provided by the patient. No language interpreter was used.  Chest Pain    Rhonda Hughes is a 71 y.o. female who presents to the Emergency Department complaining of left side chest pain that she states feels muscular rather than cardiac in nature and is worsened by deep breaths.   Pain was waxing-and-waning 24 hours ago but has gradually worsened and become constant since this morning.  Pt denies fever, neck pain, sore throat, visual disturbance, cough, SOB, abdominal pain, nausea, emesis, diarrhea, urinary symptoms, back pain, HA, weakness, numbness and rash as associated symptoms.  Pt had cardiac stents installed 4 days ago.  Pt has h/o HTN and CAD.  Pt is a former everyday smoker (quit 03/2007) and denies alcohol use.no history of pulmonary embolism or DVT.  No unilateral leg swelling.   Past Medical History  Diagnosis Date  . Hyperlipidemia   . HTN (hypertension)   . Anxiety   . Diverticulosis   . Barrett's esophagus without dysplasia   . Depression   . Rectocele   . CAD (coronary artery disease)     a. LHC 10/16/11: dLM 20%, mLAD 90%, pOM1 30%, mOM1 70% (FFR not hemodynamically significant), prox and mid RCA 30%, EF 65%;  b. PCI 10/16/11: Promus DES x 2 to mLAD    Past Surgical History  Procedure Date  . Partial hysterectomy 1980s  . Colonoscopy 01/2008    multiple diverticula in desc and sigm colon  . Esophagogastroduodenoscopy 01/2008    Barrett's esophagus, gastritis, no h.pylori, due  follow-up 01/2011  . Flexible sigmoidoscopy 08/11/2010 RECTAL PAIN/PRESSURE    SML IH, TICS  . Esophagogastroduodenoscopy 04/06/2011    Procedure: ESOPHAGOGASTRODUODENOSCOPY (EGD);  Surgeon: West Bali, MD;  Location: [facility];  Service: Endoscopy;  Laterality: N/A;  8:30    Family History  Problem Relation Age of Onset  . Coronary artery disease Father 21  . Colon cancer Neg Hx   . GI problems Mother     perforated colon   . Heart attack Father 62  . Coronary artery disease Paternal Aunt   . Coronary artery disease Paternal Uncle     History  Substance Use Topics  . Smoking status: Former Smoker -- 1.0 packs/day for 53 years    Types: Cigarettes    Quit date: 03/20/2007  . Smokeless tobacco: Never Used  . Alcohol Use: No    No OB history provided.  Review of Systems  Cardiovascular: Positive for chest pain.  A complete 10 system review of systems was obtained and all systems are negative except as noted in the HPI and PMH.    Allergies  Adhesive and Sulfa antibiotics  Home Medications   Current Outpatient Rx  Name Route Sig Dispense Refill  . ALPRAZOLAM 0.25 MG PO TABS Oral Take 0.25 mg by mouth daily as needed. For anxiety    . ASPIRIN 81 MG PO CHEW Oral Chew 1 tablet (81 mg total) by mouth daily.    . ATORVASTATIN CALCIUM  10 MG PO TABS Oral Take 10 mg by mouth at bedtime.     Marland Kitchen BIOTIN 1000 MCG PO TABS Oral Take 1,000 mcg by mouth daily.     . BUDESONIDE-FORMOTEROL FUMARATE 80-4.5 MCG/ACT IN AERO Inhalation Inhale 2 puffs into the lungs 2 (two) times daily. 1 Inhaler 12  . CALCIUM CARBONATE-VITAMIN D 600-200 MG-UNIT PO TABS Oral Take 1 tablet by mouth 2 (two) times daily.     Marland Kitchen CLOPIDOGREL BISULFATE 75 MG PO TABS Oral Take 1 tablet (75 mg total) by mouth daily with breakfast. 30 tablet 11  . LISINOPRIL-HYDROCHLOROTHIAZIDE 20-12.5 MG PO TABS Oral Take 1 tablet by mouth at bedtime.    Marland Kitchen NIACIN 500 MG PO TABS Oral Take 500 mg by mouth daily.    Marland Kitchen NITROGLYCERIN  0.4 MG SL SUBL Sublingual Place 1 tablet (0.4 mg total) under the tongue every 5 (five) minutes as needed for chest pain. 25 tablet 11  . OMEGA-3-ACID ETHYL ESTERS 1 G PO CAPS Oral Take 2 g by mouth daily.    Marland Kitchen PANTOPRAZOLE SODIUM 40 MG PO TBEC Oral Take 1 tablet (40 mg total) by mouth daily. 30 tablet 3    Take this instead of Prilosec because you are now  ...  . POTASSIUM CHLORIDE CRYS ER 20 MEQ PO TBCR Oral Take 20 mEq by mouth daily.     . VENLAFAXINE HCL ER 37.5 MG PO CP24 Oral Take 37.5 mg by mouth at bedtime.    Marland Kitchen VITAMIN B-12 1000 MCG PO TABS Oral Take 2,000 mcg by mouth daily.      BP 150/75  Pulse 72  Temp 97.9 F (36.6 C) (Oral)  Resp 20  Ht 5\' 1"  (1.549 m)  Wt 131 lb (59.421 kg)  BMI 24.75 kg/m2  SpO2 98%  Physical Exam  Nursing note and vitals reviewed. Constitutional: She is oriented to person, place, and time. She appears well-developed.  HENT:  Head: Normocephalic and atraumatic.  Eyes: EOM are normal.  Neck: No tracheal deviation present.  Cardiovascular: Normal rate, regular rhythm and normal heart sounds.   No murmur heard. Pulmonary/Chest: Effort normal. She has no wheezes.       Shallow breath sounds.  No tenderness of left chest wall.  No rash noted on left chest wall or inferior left breast.   Abdominal: Soft. There is no tenderness.  Musculoskeletal: Normal range of motion. She exhibits no edema.  Neurological: She is alert and oriented to person, place, and time.  Skin: Skin is warm. No rash noted.  Psychiatric: She has a normal mood and affect.    ED Course  Procedures (including critical care time) DIAGNOSTIC STUDIES: Oxygen Saturation is 98% on room air, normal by my interpretation.    COORDINATION OF CARE: 11:23PM- Ordered morphine, toradol, d-dimer, CBC, troponin, c-met, chest XR.  Date: 10/21/2011  Rate: 64  Rhythm: normal sinus rhythm  QRS Axis: normal  Intervals: normal  ST/T Wave abnormalities: normal  Conduction Disutrbances:  none  Narrative Interpretation:   Old EKG Reviewed: No significant changes noted     Labs Reviewed  CBC WITH DIFFERENTIAL - Abnormal; Notable for the following:    WBC 11.0 (*)     All other components within normal limits  COMPREHENSIVE METABOLIC PANEL - Abnormal; Notable for the following:    Sodium 133 (*)     Potassium 3.2 (*)     Chloride 94 (*)     Creatinine, Ser 1.15 (*)     GFR calc  non Af Amer 47 (*)     GFR calc Af Amer 55 (*)     All other components within normal limits  D-DIMER, QUANTITATIVE - Abnormal; Notable for the following:    D-Dimer, Quant 1.57 (*)     All other components within normal limits  TROPONIN I   Ct Angio Chest W/cm &/or Wo Cm  10/21/2011  *RADIOLOGY REPORT*  Clinical Data: Chest pain  CT ANGIOGRAPHY CHEST  Technique:  Multidetector CT imaging of the chest using the standard protocol during bolus administration of intravenous contrast. Multiplanar reconstructed images including MIPs were obtained and reviewed to evaluate the vascular anatomy.  Contrast: OMNIPAQUE IOHEXOL 350 MG/ML SOLN  Comparison: 10/20/2011 radiograph, 10/02/2011 CT  Findings: Pulmonary arterial branches are patent.  Normal caliber aorta with scattered atherosclerotic disease.  Coronary artery calcification and/or stents.  Heart size upper normal.  No pleural or pericardial effusion.  Limited images through the upper abdomen demonstrate a incompletely characterized left adrenal nodule and right adrenal body fullness without a dominant/measurable nodule.  Centrolobular emphysematous changes increased linear opacity within the right lower lobe.  Bibasilar atelectasis.  Central airways are patent.  No pneumothorax.  No acute osseous finding.  IMPRESSION: No pulmonary embolism identified.  Coronary artery calcification.  Linear lung base opacities, favored to reflect atelectasis.  Emphysema.  Incompletely characterized left adrenal nodule. An adenoma is favored.  Recommend MRI for  definitive characterization.   Original Report Authenticated By: Waneta Martins, M.D.    Dg Chest Portable 1 View  10/20/2011  *RADIOLOGY REPORT*  Clinical Data: Chest pain  PORTABLE CHEST - 1 VIEW  Comparison: None.  Findings: 2305 hours.  Mild asymmetric elevation of the right hemidiaphragm.  There is right base patchy atelectasis or infiltrate. The cardiopericardial silhouette is enlarged.  No overt pulmonary edema or focal dilatation. Telemetry leads overlie the chest.  IMPRESSION: Cardiomegaly with atelectasis or infiltrate at the right base.   Original Report Authenticated By: ERIC A. MANSELL, M.D.     I personally reviewed the imaging tests through PACS system  I reviewed available ER/hospitalization records thought the EMR    1. Pleuritic chest pain       MDM  The patient is well-appearing.  Her symptoms today are not consistent with acute coronary syndrome.  Her EKG and troponin are normal.  Given her pleuritic left-sided chest pain a recent hospitalization a d-dimer was obtained which was elevated.  CT angiogram chest demonstrates no evidence of pulmonary embolus or other pulmonary pathology.  2:47 AM Discharge home in good condition.  Her pain is nearly gone at this time.  Home with anti-inflammatories.  This is likely pleurisy.   I personally performed the services described in this documentation, which was scribed in my presence. The recorded information has been reviewed and considered.           Rhonda Co, MD 10/21/11 [phone]

## 2011-10-20 NOTE — ED Notes (Addendum)
Pt reports she began having "twinges" in chest last night and continued to this morning.  States tightness did go away this afternoon, but returned tonight. States she did have stents placed on Friday, and called cardiologist today.  Was instructed to come to ED if pain got worse.  Pt reports she did take 3 SL nitro, one at a time each 5 minutes apart, about 9pm.

## 2011-10-20 NOTE — Telephone Encounter (Signed)
Pt calling re having some discomfort since procedure Friday

## 2011-10-21 ENCOUNTER — Emergency Department (HOSPITAL_COMMUNITY): Payer: Medicare Other

## 2011-10-21 MED ORDER — IBUPROFEN 600 MG PO TABS: 600.0000 mg | ORAL_TABLET | Freq: Three times a day (TID) | ORAL | Status: DC | PRN

## 2011-10-21 MED ORDER — IOHEXOL 350 MG/ML SOLN
100.0000 mL | Freq: Once | INTRAVENOUS | Status: AC | PRN
  Administered 2011-10-21: 100 mL via INTRAVENOUS

## 2011-10-21 MED ORDER — HYDROCODONE-ACETAMINOPHEN 5-325 MG PO TABS: 1.0000 | ORAL_TABLET | ORAL | Status: DC | PRN

## 2011-10-21 NOTE — Telephone Encounter (Signed)
Tried to call pt today and left message on phone as she did not answer home phone or mobile. cdm

## 2011-10-21 NOTE — Telephone Encounter (Signed)
Pt was seen in ED on October 1,2013

## 2011-10-22 NOTE — Telephone Encounter (Signed)
Called pt again and left message on mobile. cdm

## 2011-10-23 NOTE — Telephone Encounter (Signed)
Spoke to pt this am. She is feeling much better. No CP. NO SOB. cdm

## 2011-10-26 ENCOUNTER — Telehealth: Payer: Self-pay | Admitting: Cardiovascular Disease

## 2011-10-26 MED ORDER — CLOPIDOGREL BISULFATE 75 MG PO TABS: 75.0000 mg | ORAL_TABLET | Freq: Every day | ORAL | Status: DC

## 2011-10-26 MED ORDER — PANTOPRAZOLE SODIUM 40 MG PO TBEC: 40.0000 mg | DELAYED_RELEASE_TABLET | Freq: Every day | ORAL | Status: DC

## 2011-10-26 NOTE — Telephone Encounter (Signed)
New message : would like a 90 day pres sent to [facility] mail order for Prilosec(generic) and Plavix (generic).

## 2011-10-26 NOTE — Telephone Encounter (Signed)
RX request passed to christy tuck for refills

## 2011-11-05 ENCOUNTER — Telehealth: Payer: Self-pay | Admitting: Internal Medicine

## 2011-11-05 NOTE — Telephone Encounter (Signed)
ONO 10/05/11 - spent 3h 37 min at pulse ox < 88%. Wil d\/w her on 12/14/11 viswit

## 2011-11-10 ENCOUNTER — Encounter: Payer: Self-pay | Admitting: Internal Medicine

## 2011-11-12 ENCOUNTER — Encounter: Payer: Self-pay | Admitting: Cardiovascular Disease

## 2011-11-12 ENCOUNTER — Ambulatory Visit (INDEPENDENT_AMBULATORY_CARE_PROVIDER_SITE_OTHER): Payer: Medicare Other | Admitting: Cardiovascular Disease

## 2011-11-12 VITALS — BP 132/68 | HR 60 | Ht 61.0 in | Wt 130.0 lb

## 2011-11-12 DIAGNOSIS — I2510 Atherosclerotic heart disease of native coronary artery without angina pectoris: Secondary | ICD-10-CM

## 2011-11-12 NOTE — Patient Instructions (Addendum)
Your physician wants you to follow-up in:  6 months. You will receive a reminder letter in the mail two months in advance. If you don't receive a letter, please call our office to schedule the follow-up appointment.   

## 2011-11-12 NOTE — Progress Notes (Signed)
History of Present Illness: 71 yo female with history of CAD, HTN, HLD, anxiety, Barrett's esophagus who is here today for cardiac follow up. I saw her as a new patient 10/14/11 for evaluation of chest pain. She reported tightness and pains in the center of her chest with exertion, dyspnea and fatigue. She had been seen by Dr. Marchelle Gearing prior her first visit her and her CT chest showed calcifications in the left main and all three major coronary arteries. I arranged a cardiac cath on 10/16/11. She was found to have a moderate 70% stenosis in the first obtuse marginal branch and a 90% stenosis in the mid LAD. FFR of the obtuse marginal stenosis was 0.98 and following infusion of adenosine was 0.89 suggesting this stenosis was not flow limiting. I could not navigate the flow wire into the LAD. This stenosis appeared critical. I then placed overlapping stents in the mid LAD as this was felt to be the culprit lesion. (2.5 x 24 mm Promus Element with more proximal overlapping 2.75 x 8 mm Promus Element DES). She did well following the procedure.   She is here today for follow up. She is feeling great. No chest pain, chest tightness, SOB, palpitations. She is back to exercising. No bleeding issues.   Primary Care Physician: Rance Muir  Last Lipid Profile: Followed in primary care.   Past Medical History  Diagnosis Date  . Hyperlipidemia   . HTN (hypertension)   . Anxiety   . Diverticulosis   . Barrett's esophagus without dysplasia   . Depression   . Rectocele   . CAD (coronary artery disease)     a. LHC 10/16/11: dLM 20%, mLAD 90%, pOM1 30%, mOM1 70% (FFR not hemodynamically significant), prox and mid RCA 30%, EF 65%;  b. PCI 10/16/11: Promus DES x 2 to mLAD    Past Surgical History  Procedure Date  . Partial hysterectomy 1980s  . Colonoscopy 01/2008    multiple diverticula in desc and sigm colon  . Esophagogastroduodenoscopy 01/2008    Barrett's esophagus, gastritis, no h.pylori, due  follow-up 01/2011  . Flexible sigmoidoscopy 08/11/2010 RECTAL PAIN/PRESSURE    SML IH, TICS  . Esophagogastroduodenoscopy 04/06/2011    Procedure: ESOPHAGOGASTRODUODENOSCOPY (EGD);  Surgeon: West Bali, MD;  Location: [facility];  Service: Endoscopy;  Laterality: N/A;  8:30    Current Outpatient Prescriptions  Medication Sig Dispense Refill  . ALPRAZolam (XANAX) 0.25 MG tablet Take 0.25 mg by mouth daily as needed. For anxiety      . aspirin 81 MG chewable tablet Chew 1 tablet (81 mg total) by mouth daily.      Marland Kitchen atorvastatin (LIPITOR) 10 MG tablet Take by mouth at bedtime. Take 2 tabs daily      . Biotin 1000 MCG tablet Take 1,000 mcg by mouth daily.       . Calcium Carbonate-Vitamin D (CALCIUM + D) 600-200 MG-UNIT TABS Take 1 tablet by mouth 2 (two) times daily.       . clopidogrel (PLAVIX) 75 MG tablet Take 1 tablet (75 mg total) by mouth daily with breakfast.  90 tablet  2  . HYDROcodone-acetaminophen (NORCO/VICODIN) 5-325 MG per tablet Take 1 tablet by mouth every 4 (four) hours as needed for pain.  10 tablet  0  . lisinopril-hydrochlorothiazide (PRINZIDE,ZESTORETIC) 20-12.5 MG per tablet Take 1 tablet by mouth at bedtime.      . niacin 500 MG tablet Take 500 mg by mouth daily.      Marland Kitchen  nitroGLYCERIN (NITROSTAT) 0.4 MG SL tablet Place 1 tablet (0.4 mg total) under the tongue every 5 (five) minutes as needed for chest pain.  25 tablet  11  . omega-3 acid ethyl esters (LOVAZA) 1 G capsule Take 2 g by mouth daily.      . pantoprazole (PROTONIX) 40 MG tablet Take 1 tablet (40 mg total) by mouth daily.  90 tablet  1  . potassium chloride SA (K-DUR,KLOR-CON) 20 MEQ tablet Take 20 mEq by mouth daily.       Marland Kitchen venlafaxine XR (EFFEXOR-XR) 37.5 MG 24 hr capsule Take 37.5 mg by mouth at bedtime.      . vitamin B-12 (CYANOCOBALAMIN) 1000 MCG tablet Take 2,000 mcg by mouth daily.      Marland Kitchen DISCONTD: niacin (NIASPAN) 500 MG CR tablet Take 500 mg by mouth at bedtime.          Allergies  Allergen  Reactions  . Adhesive (Tape) Other (See Comments)    Turns skin red  . Sulfa Antibiotics Other (See Comments)    Childhood allergy    History   Social History  . Marital Status: Married    Spouse Name: N/A    Number of Children: 1  . Years of Education: N/A   Occupational History  . Retired Personal assistant     retired  .     Social History Main Topics  . Smoking status: Former Smoker -- 1.0 packs/day for 53 years    Types: Cigarettes    Quit date: 03/20/2007  . Smokeless tobacco: Never Used  . Alcohol Use: No  . Drug Use: No  . Sexually Active: Not on file   Other Topics Concern  . Not on file   Social History Narrative  . No narrative on file    Family History  Problem Relation Age of Onset  . Coronary artery disease Father 62  . Colon cancer Neg Hx   . GI problems Mother     perforated colon   . Heart attack Father 13  . Coronary artery disease Paternal Aunt   . Coronary artery disease Paternal Uncle     Review of Systems:  As stated in the HPI and otherwise negative.   BP 132/68  Pulse 60  Ht 5\' 1"  (1.549 m)  Wt 130 lb (58.968 kg)  BMI 24.56 kg/m2  Physical Examination: General: Well developed, well nourished, NAD HEENT: OP clear, mucus membranes moist SKIN: warm, dry. No rashes. Neuro: No focal deficits Musculoskeletal: Muscle strength 5/5 all ext Psychiatric: Mood and affect normal Neck: No JVD, no carotid bruits, no thyromegaly, no lymphadenopathy. Lungs:Clear bilaterally, no wheezes, rhonci, crackles Cardiovascular: Regular rate and rhythm. No murmurs, gallops or rubs. Abdomen:Soft. Bowel sounds present. Non-tender.  Extremities: No lower extremity edema. Pulses are 2 + in the bilateral DP/PT.  Cardiac cath 10/16/11:  Left main: The distal vessel has 20% stenosis.  Left Anterior Descending Artery:Moderate sized vessel that courses to the apex. The proximal vessel has diffuse plaque. There is calcification in the proximal and mid  vessel. The mid vessel has a 90% stenosis. The diagonal branch is very small.  Circumflex Artery: Moderate sized vessel with moderate sized obtuse marginal branch. The first obtuse marginal branch has 30% proximal stenosis and 70% mid stenosis. The AV groove Circumflex is small in caliber beyond the takeoff of the marginal branch.  Right Coronary Artery: Moderate sized dominant vessel with diffuse 30% stenosis in the proximal and mid vessel. No flow limiting lesions noted.  Left Ventricular Angiogram: LVEF=65% PCI: Overlapping DES mid LAD. FFR of OM1 of 0.89 (not flow limiting)  Assessment and Plan:   1. CAD: s/p recent placement overlapping drug eluting stents mid LAD 10/16/11. Moderate disease in first OM branch with FFR of 0.89 so lesion not stented. She will need dual antiplatelet therapy with ASA and Plavix for at least one year. Continue statin. Lipids followed in primary care. BP well controlled.

## 2011-12-14 ENCOUNTER — Ambulatory Visit (INDEPENDENT_AMBULATORY_CARE_PROVIDER_SITE_OTHER): Payer: Medicare Other | Admitting: Internal Medicine

## 2011-12-14 ENCOUNTER — Encounter: Payer: Self-pay | Admitting: Internal Medicine

## 2011-12-14 VITALS — BP 118/72 | HR 66 | Temp 97.7°F | Ht 62.0 in | Wt 132.0 lb

## 2011-12-14 DIAGNOSIS — R0989 Other specified symptoms and signs involving the circulatory and respiratory systems: Secondary | ICD-10-CM

## 2011-12-14 DIAGNOSIS — R0609 Other forms of dyspnea: Secondary | ICD-10-CM

## 2011-12-14 DIAGNOSIS — Z9189 Other specified personal risk factors, not elsewhere classified: Secondary | ICD-10-CM

## 2011-12-14 DIAGNOSIS — R0789 Other chest pain: Secondary | ICD-10-CM

## 2011-12-14 DIAGNOSIS — R0600 Dyspnea, unspecified: Secondary | ICD-10-CM

## 2011-12-14 DIAGNOSIS — J449 Chronic obstructive pulmonary disease, unspecified: Secondary | ICD-10-CM

## 2011-12-14 LAB — PULMONARY FUNCTION TEST

## 2011-12-14 NOTE — Progress Notes (Signed)
Subjective:    Patient ID: Rhonda Hughes, female    DOB: [birth year], 71 y.o.   MRN: [id number]  HPI PCP is Provider Not In System  Self referred.  Body mass index is 24.83 kg/(m^2).  reports that she quit smoking about 4 years ago. Her smoking use included Cigarettes. She has a 58 pack-year smoking history. She has never used smokeless tobacco.    IOV 09/07/2011 71 year old female. Self referred for dyspnea. Retiired Producer, television/film/video.  Reports she loves to work out in yard. However, noticing dyspnea with yard work now. Insidious onset. Noticed it last summer with yard work. Feels this summer 2013 it is worse. Did not notice it much in winter 2012/early 2013 because she is more sedentary. There is associated chest tightness (but not pain other than occ small tingling sensation)  in sternal area. Rests by going home inside and this relieves dyspnea. Notices dyspnea with stairs and is again improved with rest. Chest tightness also improves with rest. She denies associated cough other than a month ago describes acute bronchitis during which she coughed a lot but it resolved. Denies associated hemoptysis, orthopnea, edema, paroxysmal nocturnal dyspnea, wheezing, chest pain or weight loss, fever, sputum.  There is no variation of symptoms with dust, pollen, fumes  She is upset that she is unable to do as much yard work as she would love to [employer], vegetable garden)   Fam Hx  - Dad: CAD. SMoked + - Mom: Breathing problems NOS. Smoked + - Sister: abnormal CXR NOS. Smoked +  Occ Expo  - Producer, television/film/video. No birds. No Mold  Tests  - No hx of cxr or pft . Hgb 12.2gm% in Dec 2012 - Walk test 09/07/2011: 86% on first lap 185 feet  = Spirometyr - fev1 1.88L/96%, Ratio 60, FVC 3.1L/125%  REC Your shortness of breath - is unclear why you have it  Please have breathing test called full [facility]  Once done, call us and then I will review and get back to you  Based on breathing test  result, I will have you come in or have more tests done; possibly a scan  OV 10/13/2011 Folluwo dyspnea: No change since last visit. STill with dyspnea and chest tightness. Since last night though chest tightness and dyspnea flaring up a bit more; but part of usual ups and and downs. No fever or sputum. Dyspnea is purely exertional component - notices with walking and yards. Improved by rest. No relation with dust, pollen, or chemicals. Of note, the chest tightnnes is not pain but she points to sternal area and can happen at rest. Overall severity still rated as mild-moderate. Not progressive since last visit    PFTs 09/25/11:  Suggesting is normal but positive BD response suggests  asthma on one part (spirometry and lung volumes) but on part is suggesting emphysema (low diffusion but only 71%). They are not giving full answer for dyspnea and desaturation.   CT chest 10/02/11: I MPRESSION:  1. Mild diffuse bronchial wall thickening and moderate - severe  centrilobular emphysema; findings compatible with COPD.  2. Atherosclerosis, including left main and three-vessel coronary  artery disease. Assessment for potential risk factor modification,  dietary therapy or pharmacologic therapy may be warranted, if  clinically indicated.  Original Report Authenticated By: Florencia Reasons, M.D.    CAD risk factors  - father, paternal aunt, sisters - CAD +  - smoker  - on lipitor  REC  Please start symbicort 80/4.5 2 puff twice daily - take sample, and show technique. Script sent to [facility] at your place  Get flu shot from your PMD  Please see [facility] in [place]  Return to see me in 2 months to report progress: spirometry at followup with Jerolyn Shin and walk test by CMA    OV 12/14/2011 FU Dyspnea over and above CT findings of emphysema and spirometry findings   She had  cardiac cath on 10/16/11. She was found to have a moderate 70% stenosis in the first obtuse marginal branch and a 90%  stenosis in the mid LAD. FFR of the obtuse marginal stenosis was 0.98 and following infusion of adenosine was 0.89 suggesting this stenosis was not flow limiting. Dr Mike Craze  could not navigate the flow wire into the LAD. This stenosis appeared critical. He then placed overlapping stents in the mid LAD as this was felt to be the culprit lesion. (2.5 x 24 mm Promus Element with more proximal overlapping 2.75 x 8 mm Promus Element DES  After stent placement, dyspnea resolved 100%. She is asymptomatic now. Denies cough, chest tightness, sputum. She is doing ADLs. She mowing yard and doing yard work. Feels so well that she is thankful for cards referral and also has stopped using symbicort. Despite stopping symbicort no dyspnea. CAT score today is 0  PFT today is normal except lung volumes show air trapping/hyper inflation and reduced dlco  -c/w emphysema. Fev1 1.8L/98%, 8% BD response, RAtio 55, TLC 142%, RV 164%, DLCO 11.8/68%. Walking desaturation test on 12/14/2011 185 feet x 3 laps:  did NOT desaturate. Rest pulse ox was 94%, final pulse ox was 93%. HR response 71/min at rest to 94/min at peak exertion.          CAT COPD Symptom and Quality of Life Score ([employer] trademark)  10/13/11 12/14/2011 Post coronary stent  Never Cough -> Cough all the time 0 0  No phlegm in chest -> Chest is full of phlegm 0 0  No chest tightness -> Chest feels very tight 2 0  No dyspnea for 1 flight stairs/hill -> Very dyspneic for 1 flight of stairs 3 0  No limitations for ADL at home -> Very limited with ADL at home 4 0  Confident leaving home -> Not at all confident leaving home 0 0  Sleep soundly -> Do not sleep soundly because of lung condition 0 0  Lots of Energy -> No energy at all 0 0  TOTAL Score (max 40)  9 0      Review of Systems  Constitutional: Negative for fever and unexpected weight change.  HENT: Negative for ear pain, nosebleeds, congestion, sore throat, rhinorrhea, sneezing,  trouble swallowing, dental problem, postnasal drip and sinus pressure.   Eyes: Negative for redness and itching.  Respiratory: Negative for cough, chest tightness, shortness of breath and wheezing.   Cardiovascular: Negative for palpitations and leg swelling.  Gastrointestinal: Negative for nausea and vomiting.  Genitourinary: Negative for dysuria.  Musculoskeletal: Negative for joint swelling.  Skin: Negative for rash.  Neurological: Negative for headaches.  Hematological: Does not bruise/bleed easily.  Psychiatric/Behavioral: Negative for dysphoric mood. The patient is not nervous/anxious.        Objective:   Physical Exam Vitals reviewed. Constitutional: She is oriented to person, place, and time. She appears well-developed and well-nourished. No distress.       Body mass index is 24.83 kg/(m^2).   HENT:  Head: Normocephalic and  atraumatic.  Right Ear: External ear normal.  Left Ear: External ear normal.  Mouth/Throat: Oropharynx is clear and moist. No oropharyngeal exudate.  Eyes: Conjunctivae and EOM are normal. Pupils are equal, round, and reactive to light. Right eye exhibits no discharge. Left eye exhibits no discharge. No scleral icterus.  Neck: Normal range of motion. Neck supple. No JVD present. No tracheal deviation present. No thyromegaly present.  Cardiovascular: Normal rate, regular rhythm, normal heart sounds and intact distal pulses.  Exam reveals no gallop and no friction rub.   No murmur heard. Pulmonary/Chest: Effort normal and breath sounds normal. No respiratory distress. She has no wheezes. She has no rales. She exhibits no tenderness.  Abdominal: Soft. Bowel sounds are normal. She exhibits no distension and no mass. There is no tenderness. There is no rebound and no guarding.  Musculoskeletal: Normal range of motion. She exhibits no edema and no tenderness.  Lymphadenopathy:    She has no cervical adenopathy.  Neurological: She is alert and oriented to  person, place, and time. She has normal reflexes. No cranial nerve deficit. She exhibits normal muscle tone. Coordination normal.  Skin: Skin is warm and dry. No rash noted. She is not diaphoretic. No erythema. No pallor.  Psychiatric: She has a normal mood and affect. Her behavior is normal. Judgment and thought content normal.          Assessment & Plan:

## 2011-12-14 NOTE — Assessment & Plan Note (Addendum)
Dyspnea relieved after stent placement. She is no longer desaturating after stent placement. This is remarkable. I have explained that she does have emphysema but is mild and should not prevent adl's but does placer her at risk for aecopd. She is to use albuterol prn. If dyspnea recurs, suggested she talk to cards first.No further fu in pulmoary  > 50% of this 15 min visit spent in face to face counseling

## 2011-12-14 NOTE — Patient Instructions (Addendum)
You do have emphysema/copd but is mild and not enough to make you short of breath If short of breath comes back, think heart and if heart doctors feel heart ok then come back here Use albuterol as needed Return again only if needed

## 2011-12-14 NOTE — Progress Notes (Signed)
PFT done today. 

## 2012-01-18 ENCOUNTER — Encounter: Payer: Self-pay | Admitting: *Deleted

## 2012-02-10 ENCOUNTER — Encounter: Payer: Self-pay | Admitting: Gastroenterology

## 2012-02-25 ENCOUNTER — Telehealth: Payer: Self-pay | Admitting: *Deleted

## 2012-02-25 ENCOUNTER — Encounter: Payer: Self-pay | Admitting: Cardiovascular Disease

## 2012-02-25 NOTE — Telephone Encounter (Signed)
I called and spoke with pt regarding my chart message she sent about increased shortness of breath.  Pt reports she has noticed increased shortness of breath for last few weeks. This occurs with minimal exertion and goes away with rest.  Today she noticed it while cleaning out a closet. Also reports lack of energy and feeling tired again. No chest pain. Will review with Dr. Clifton James

## 2012-02-25 NOTE — Telephone Encounter (Signed)
We need to see her back. I can see her next Friday or we can have her seen sooner by Lorin Picket or Lawson Fiscal. She will likely need repeat cath since she has a 70% OM lesion that we did not treat (FFR was ok) and overlapping DES in the LAD. Thayer Ohm

## 2012-02-25 NOTE — Telephone Encounter (Signed)
Spoke with pt. She will come in to see Norma Fredrickson, NP tomorrow at 3:30

## 2012-02-26 ENCOUNTER — Other Ambulatory Visit: Payer: Self-pay | Admitting: Nurse Practitioner

## 2012-02-26 ENCOUNTER — Encounter: Payer: Self-pay | Admitting: Nurse Practitioner

## 2012-02-26 ENCOUNTER — Ambulatory Visit (INDEPENDENT_AMBULATORY_CARE_PROVIDER_SITE_OTHER): Payer: Medicare Other | Admitting: Nurse Practitioner

## 2012-02-26 VITALS — BP 122/74 | HR 68 | Ht 61.0 in | Wt 134.0 lb

## 2012-02-26 DIAGNOSIS — R0600 Dyspnea, unspecified: Secondary | ICD-10-CM

## 2012-02-26 DIAGNOSIS — Z01811 Encounter for preprocedural respiratory examination: Secondary | ICD-10-CM

## 2012-02-26 DIAGNOSIS — R0609 Other forms of dyspnea: Secondary | ICD-10-CM

## 2012-02-26 DIAGNOSIS — R079 Chest pain, unspecified: Secondary | ICD-10-CM

## 2012-02-26 LAB — PROTIME-INR
INR: 0.91 (ref <1.50)
Prothrombin Time: 12.3 seconds (ref 11.6–15.2)

## 2012-02-26 LAB — CBC WITH DIFFERENTIAL/PLATELET
Basophils Absolute: 0 10*3/uL (ref 0.0–0.1)
Basophils Relative: 0 % (ref 0–1)
Eosinophils Absolute: 0.2 10*3/uL (ref 0.0–0.7)
Eosinophils Relative: 3 % (ref 0–5)
HCT: 35.6 % — ABNORMAL LOW (ref 36.0–46.0)
Hemoglobin: 12.6 g/dL (ref 12.0–15.0)
Lymphocytes Relative: 23 % (ref 12–46)
Lymphs Abs: 2 10*3/uL (ref 0.7–4.0)
MCH: 32.1 pg (ref 26.0–34.0)
MCHC: 35.4 g/dL (ref 30.0–36.0)
MCV: 90.8 fL (ref 78.0–100.0)
Monocytes Absolute: 0.7 10*3/uL (ref 0.1–1.0)
Monocytes Relative: 8 % (ref 3–12)
Neutro Abs: 5.8 10*3/uL (ref 1.7–7.7)
Neutrophils Relative %: 66 % (ref 43–77)
Platelets: 281 10*3/uL (ref 150–400)
RBC: 3.92 MIL/uL (ref 3.87–5.11)
RDW: 12.8 % (ref 11.5–15.5)
WBC: 8.7 10*3/uL (ref 4.0–10.5)

## 2012-02-26 LAB — BASIC METABOLIC PANEL
BUN: 10 mg/dL (ref 6–23)
CO2: 30 mEq/L (ref 19–32)
Calcium: 9.9 mg/dL (ref 8.4–10.5)
Chloride: 97 mEq/L (ref 96–112)
Creat: 1.04 mg/dL (ref 0.50–1.10)
Glucose, Bld: 93 mg/dL (ref 70–99)
Potassium: 3.7 mEq/L (ref 3.5–5.3)
Sodium: 137 mEq/L (ref 135–145)

## 2012-02-26 LAB — APTT: aPTT: 37 seconds (ref 24–37)

## 2012-02-26 MED ORDER — ISOSORBIDE MONONITRATE ER 30 MG PO TB24: 30.0000 mg | ORAL_TABLET | Freq: Every day | ORAL | Status: DC

## 2012-02-26 NOTE — Patient Instructions (Addendum)
We are checking labs today.  I am going to place you on Imdur 30 mg a day - this is to help with your symptoms. It can cause a headache. This prescription is at the drug store.  We are going to arrange for a heart catheterization next Thursday  You are scheduled for a cardiac catheterization on Thursday, February 13th with Dr. Clifton James or associate.  Go to [facility] 2nd Floor Short Stay on Thursday, February 13th at 9:30am. No food or drink after midnight on Wednesday. You may take your medications with a sip of water on the day of your procedure.   Use your NTG under your tongue for recurrent chest pain. May take one tablet every 5 minutes. If you are still having discomfort after 3 tablets in 15 minutes, call 911.  Call the [facility] office at [phone] if you have any questions, problems or concerns.

## 2012-02-26 NOTE — Progress Notes (Signed)
 Rhonda Hughes Date of Birth: 02/22/1940 Medical Record #[id number]  History of Present Illness: Rhonda Hughes is seen back today for a work in visit. She is seen for Dr. McAlhany. She has known CAD with prior DES to the LAD. Had a stenosis in the LCX as well but was not treated due to her FFR being ok. This was back in September. She has never had a chest pain syndrome - she has dyspnea and fatigue. Her other issues include HLD and HLD. She remains on her Plavix.   She comes in today. She is here alone. She is not doing well. Has had the return of DOE with just minimal activities and significant fatigue. She feels just like she did back in the fall. She has no symptoms with rest. No NTG use.   Current Outpatient Prescriptions on File Prior to Visit  Medication Sig Dispense Refill  . ALPRAZolam (XANAX) 0.25 MG tablet Take 0.25 mg by mouth daily as needed. For anxiety      . aspirin 81 MG chewable tablet Chew 1 tablet (81 mg total) by mouth daily.      . atorvastatin (LIPITOR) 10 MG tablet Take 10 mg by mouth daily. Take 2 tabs daily      . Biotin 1000 MCG tablet Take 1,000 mcg by mouth daily.       . Calcium Carbonate-Vitamin D (CALCIUM + D) 600-200 MG-UNIT TABS Take 1 tablet by mouth 2 (two) times daily.       . clopidogrel (PLAVIX) 75 MG tablet Take 1 tablet (75 mg total) by mouth daily with breakfast.  90 tablet  2  . lisinopril-hydrochlorothiazide (PRINZIDE,ZESTORETIC) 20-12.5 MG per tablet Take 1 tablet by mouth at bedtime.      . niacin 500 MG tablet Take 500 mg by mouth daily.      . nitroGLYCERIN (NITROSTAT) 0.4 MG SL tablet Place 1 tablet (0.4 mg total) under the tongue every 5 (five) minutes as needed for chest pain.  25 tablet  11  . pantoprazole (PROTONIX) 40 MG tablet Take 1 tablet (40 mg total) by mouth daily.  90 tablet  1  . potassium chloride SA (K-DUR,KLOR-CON) 20 MEQ tablet Take 20 mEq by mouth daily.       . venlafaxine XR (EFFEXOR-XR) 37.5 MG 24 hr capsule Take 37.5 mg by  mouth at bedtime.      . vitamin B-12 (CYANOCOBALAMIN) 1000 MCG tablet Take 2,000 mcg by mouth daily.      . isosorbide mononitrate (IMDUR) 30 MG 24 hr tablet Take 1 tablet (30 mg total) by mouth daily.  10 tablet  0  . [DISCONTINUED] niacin (NIASPAN) 500 MG CR tablet Take 500 mg by mouth at bedtime.          Allergies  Allergen Reactions  . Adhesive (Tape) Other (See Comments)    Turns skin red  . Sulfa Antibiotics Other (See Comments)    Childhood allergy    Past Medical History  Diagnosis Date  . Hyperlipidemia   . HTN (hypertension)   . Anxiety   . Diverticulosis   . Barrett's esophagus without dysplasia   . Depression   . Rectocele   . CAD (coronary artery disease)     a. LHC 10/16/11: dLM 20%, mLAD 90%, pOM1 30%, mOM1 70% (FFR not hemodynamically significant), prox and mid RCA 30%, EF 65%;  b. PCI 10/16/11: Promus DES x 2 to mLAD    Past Surgical History  Procedure Date  .   Partial hysterectomy 1980s  . Colonoscopy 01/2008    multiple diverticula in desc and sigm colon  . Esophagogastroduodenoscopy 01/2008    Barrett's esophagus, gastritis, no h.pylori, due follow-up 01/2011  . Flexible sigmoidoscopy 08/11/2010 RECTAL PAIN/PRESSURE    SML IH, TICS  . Esophagogastroduodenoscopy 04/06/2011    Procedure: ESOPHAGOGASTRODUODENOSCOPY (EGD);  Surgeon: Sandi L Fields, MD;  Location: [facility];  Service: Endoscopy;  Laterality: N/A;  8:30    History  Smoking status  . Former Smoker -- 1.0 packs/day for 53 years  . Types: Cigarettes  . Quit date: 03/20/2007  Smokeless tobacco  . Never Used    History  Alcohol Use No    Family History  Problem Relation Age of Onset  . Coronary artery disease Father 50  . Colon cancer Neg Hx   . GI problems Mother     perforated colon   . Heart attack Father 50  . Coronary artery disease Paternal Aunt   . Coronary artery disease Paternal Uncle     Review of Systems: The review of systems is per the HPI.  All other systems were  reviewed and are negative.  Physical Exam: BP 122/74  Pulse 68  Ht 5' 1" (1.549 m)  Wt 134 lb (60.782 kg)  BMI 25.32 kg/m2  SpO2 97% Patient is very pleasant and in no acute distress. Skin is warm and dry. Color is normal.  HEENT is unremarkable. Normocephalic/atraumatic. PERRL. Sclera are nonicteric. Neck is supple. No masses. No JVD. Lungs are clear. Cardiac exam shows a regular rate and rhythm. Abdomen is soft. Extremities are without edema. Gait and ROM are intact. No gross neurologic deficits noted.   LABORATORY DATA: EKG today shows sinus rhythm. No acute changes.   Labs are pending for today.   Angiographic Findings from September 2013:   Left main: The distal vessel has 20% stenosis.  Left Anterior Descending Artery:Moderate sized vessel that courses to the apex. The proximal vessel has diffuse plaque. There is calcification in the proximal and mid vessel. The mid vessel has a 90% stenosis. The diagonal branch is very small.  Circumflex Artery: Moderate sized vessel with moderate sized obtuse marginal branch. The first obtuse marginal branch has 30% proximal stenosis and 70% mid stenosis. The AV groove Circumflex is small in caliber beyond the takeoff of the marginal branch.  Right Coronary Artery: Moderate sized dominant vessel with diffuse 30% stenosis in the proximal and mid vessel. No flow limiting lesions noted.  Left Ventricular Angiogram: LVEF=65%   Impression:  1. 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  2. Preserved LV systolic function   Recommendations: Will move to inpatient cath lab today and perform FFR of the Circumflex and PCI of the LAD. If the Circumflex is flow limiting, will perform PCI of the Circumflex as well.    PCI Impression from September 2013:  1. Unstable angina  2. Successful PTCA/DES x 2 mid LAD  3. Moderate stenosis OM1, FFR of 0.89.   Recommendations: She will need ASA and Plavix for one year. Will continue statin.   Lab Results    Component Value Date   WBC 11.0* 10/20/2011   HGB 12.6 10/20/2011   HCT 36.8 10/20/2011   PLT 302 10/20/2011   GLUCOSE 98 10/20/2011   ALT 18 10/20/2011   AST 23 10/20/2011   NA 133* 10/20/2011   K 3.2* 10/20/2011   CL 94* 10/20/2011   CREATININE 1.15* 10/20/2011   BUN 13 10/20/2011   CO2 28   10/20/2011   TSH 0.875 06/23/2010   INR 1.0 10/14/2011    Assessment / Plan: 1. Return of DOE/fatigue - this appears to be her angina equivalent. We are going to proceed on with repeat cath with Dr. McAlhany next week. I have placed her on Imdur 30 mg daily in the interim. She is to restrict her activities in the interim. No other medicine changes. Checking labs today. Cath is set up for February 13th.   2. HTN - blood pressure looks ok  3. HLD - on statin.   Patient is agreeable to this plan and will call if any problems develop in the interim.   

## 2012-02-29 ENCOUNTER — Encounter (HOSPITAL_COMMUNITY): Payer: Self-pay | Admitting: Pharmacy Technician

## 2012-03-01 ENCOUNTER — Telehealth: Payer: Self-pay | Admitting: Cardiovascular Disease

## 2012-03-01 NOTE — Addendum Note (Signed)
Addended by: Tymel Conely, [place] N on: 03/01/2012 04:48 PM   Modules accepted: Orders

## 2012-03-01 NOTE — Telephone Encounter (Signed)
Pt is to have a procedure done but she is concerned about the weather and wants to discuss that with you

## 2012-03-01 NOTE — Telephone Encounter (Signed)
Spoke with pt. She is scheduled for cath in main lab on Feb 13,2014 at 11:30. She is planning on being there for cath but is concerned about the possibility of bad weather. She will call our office tomorrow afternoon or Thursday morning if she needs to cancel

## 2012-03-02 NOTE — Telephone Encounter (Signed)
Pt agreed to heart cath 03/07/12 @ 1;00 with dr Sanjuana Kava, pt agreed to plan. Labs were done prior and still in range, pt will go to ed with any further chest pain, pt been feeling well but states she is taking it very easy.

## 2012-03-02 NOTE — Telephone Encounter (Signed)
Reviewed with Norma Fredrickson np, pt was addiment about having cath with Dr Clifton James, I will continue to try to reach pt to inform her of the next lab date.

## 2012-03-02 NOTE — Telephone Encounter (Signed)
Can we call Rhonda Hughes and reschedule her cath for next week? She is coming from [place]. Thanks, chris

## 2012-03-07 ENCOUNTER — Encounter (HOSPITAL_COMMUNITY): Payer: Self-pay | Admitting: *Deleted

## 2012-03-07 ENCOUNTER — Encounter (HOSPITAL_COMMUNITY)
Admission: RE | Disposition: A | Discharge status: Home / Self Care | Payer: Self-pay | Source: Ambulatory Visit | Attending: Cardiovascular Disease

## 2012-03-07 ENCOUNTER — Ambulatory Visit (HOSPITAL_COMMUNITY)
Admission: RE | Admit: 2012-03-07 | Discharge: 2012-03-08 | Disposition: A | Discharge status: Home / Self Care | Payer: Medicare Other | Source: Ambulatory Visit | Attending: Cardiovascular Disease | Admitting: Cardiovascular Disease

## 2012-03-07 DIAGNOSIS — Z4901 Encounter for fitting and adjustment of extracorporeal dialysis catheter: Secondary | ICD-10-CM | POA: Insufficient documentation

## 2012-03-07 DIAGNOSIS — R079 Chest pain, unspecified: Secondary | ICD-10-CM | POA: Insufficient documentation

## 2012-03-07 DIAGNOSIS — I10 Essential (primary) hypertension: Secondary | ICD-10-CM | POA: Diagnosis present

## 2012-03-07 DIAGNOSIS — I2089 Other forms of angina pectoris: Secondary | ICD-10-CM | POA: Diagnosis present

## 2012-03-07 DIAGNOSIS — Z955 Presence of coronary angioplasty implant and graft: Secondary | ICD-10-CM

## 2012-03-07 DIAGNOSIS — E876 Hypokalemia: Secondary | ICD-10-CM

## 2012-03-07 DIAGNOSIS — R0609 Other forms of dyspnea: Secondary | ICD-10-CM | POA: Insufficient documentation

## 2012-03-07 DIAGNOSIS — R0989 Other specified symptoms and signs involving the circulatory and respiratory systems: Secondary | ICD-10-CM | POA: Insufficient documentation

## 2012-03-07 DIAGNOSIS — I2510 Atherosclerotic heart disease of native coronary artery without angina pectoris: Secondary | ICD-10-CM | POA: Insufficient documentation

## 2012-03-07 DIAGNOSIS — E785 Hyperlipidemia, unspecified: Secondary | ICD-10-CM | POA: Insufficient documentation

## 2012-03-07 DIAGNOSIS — I208 Other forms of angina pectoris: Secondary | ICD-10-CM | POA: Diagnosis present

## 2012-03-07 DIAGNOSIS — R0600 Dyspnea, unspecified: Secondary | ICD-10-CM

## 2012-03-07 DIAGNOSIS — Z01811 Encounter for preprocedural respiratory examination: Secondary | ICD-10-CM

## 2012-03-07 DIAGNOSIS — Z9861 Coronary angioplasty status: Secondary | ICD-10-CM | POA: Insufficient documentation

## 2012-03-07 DIAGNOSIS — I200 Unstable angina: Secondary | ICD-10-CM | POA: Diagnosis present

## 2012-03-07 HISTORY — DX: Gastro-esophageal reflux disease without esophagitis: K21.9

## 2012-03-07 HISTORY — DX: Chronic obstructive pulmonary disease, unspecified: J44.9

## 2012-03-07 HISTORY — PX: LEFT HEART CATHETERIZATION WITH CORONARY ANGIOGRAM: SHX5451

## 2012-03-07 SURGERY — LEFT HEART CATHETERIZATION WITH CORONARY ANGIOGRAM
Anesthesia: LOCAL

## 2012-03-07 MED ORDER — NITROGLYCERIN 1 MG/10 ML FOR IR/CATH LAB
INTRA_ARTERIAL | Status: AC
Start: 2012-03-07 — End: 2012-03-07
  Filled 2012-03-07: qty 10

## 2012-03-07 MED ORDER — CLOPIDOGREL BISULFATE 75 MG PO TABS
75.0000 mg | ORAL_TABLET | Freq: Every day | ORAL | Status: DC
  Administered 2012-03-08: 75 mg via ORAL
  Filled 2012-03-07: qty 1

## 2012-03-07 MED ORDER — NITROGLYCERIN 0.4 MG SL SUBL: 0.4000 mg | SUBLINGUAL_TABLET | SUBLINGUAL | Status: DC | PRN

## 2012-03-07 MED ORDER — CLOPIDOGREL BISULFATE 300 MG PO TABS
ORAL_TABLET | ORAL | Status: AC
  Filled 2012-03-07: qty 1

## 2012-03-07 MED ORDER — SODIUM CHLORIDE 0.9 % IV SOLN: 250.0000 mL | INTRAVENOUS | Status: DC | PRN

## 2012-03-07 MED ORDER — SODIUM CHLORIDE 0.9 % IV SOLN
INTRAVENOUS | Status: DC
  Administered 2012-03-07: 11:00:00 via INTRAVENOUS

## 2012-03-07 MED ORDER — ASPIRIN 81 MG PO CHEW
81.0000 mg | CHEWABLE_TABLET | Freq: Every day | ORAL | Status: DC
  Administered 2012-03-08: 81 mg via ORAL
  Filled 2012-03-07: qty 1

## 2012-03-07 MED ORDER — BIVALIRUDIN 250 MG IV SOLR
INTRAVENOUS | Status: AC
  Filled 2012-03-07: qty 250

## 2012-03-07 MED ORDER — SODIUM CHLORIDE 0.9 % IJ SOLN: 3.0000 mL | INTRAMUSCULAR | Status: DC | PRN

## 2012-03-07 MED ORDER — FENTANYL CITRATE 0.05 MG/ML IJ SOLN
INTRAMUSCULAR | Status: AC
  Filled 2012-03-07: qty 2

## 2012-03-07 MED ORDER — ASPIRIN 81 MG PO CHEW
CHEWABLE_TABLET | ORAL | Status: AC
  Filled 2012-03-07: qty 4

## 2012-03-07 MED ORDER — ACETAMINOPHEN 325 MG PO TABS: 650.0000 mg | ORAL_TABLET | ORAL | Status: DC | PRN

## 2012-03-07 MED ORDER — LIDOCAINE HCL (PF) 1 % IJ SOLN
INTRAMUSCULAR | Status: AC
  Filled 2012-03-07: qty 30

## 2012-03-07 MED ORDER — PANTOPRAZOLE SODIUM 40 MG PO TBEC
40.0000 mg | DELAYED_RELEASE_TABLET | Freq: Every day | ORAL | Status: DC
  Administered 2012-03-08: 40 mg via ORAL
  Filled 2012-03-07 (×2): qty 1

## 2012-03-07 MED ORDER — SODIUM CHLORIDE 0.9 % IV SOLN: INTRAVENOUS | Status: AC

## 2012-03-07 MED ORDER — MIDAZOLAM HCL 2 MG/2ML IJ SOLN
INTRAMUSCULAR | Status: AC
  Filled 2012-03-07: qty 2

## 2012-03-07 MED ORDER — ATORVASTATIN CALCIUM 10 MG PO TABS
10.0000 mg | ORAL_TABLET | Freq: Every evening | ORAL | Status: DC
  Administered 2012-03-07: 21:00:00 10 mg via ORAL
  Filled 2012-03-07 (×2): qty 1

## 2012-03-07 MED ORDER — DIAZEPAM 5 MG PO TABS
ORAL_TABLET | ORAL | Status: AC
  Filled 2012-03-07: qty 2

## 2012-03-07 MED ORDER — VENLAFAXINE HCL ER 37.5 MG PO CP24
37.5000 mg | ORAL_CAPSULE | Freq: Every day | ORAL | Status: DC
  Administered 2012-03-07: 21:00:00 37.5 mg via ORAL
  Filled 2012-03-07 (×2): qty 1

## 2012-03-07 MED ORDER — HYDROCHLOROTHIAZIDE 12.5 MG PO CAPS
12.5000 mg | ORAL_CAPSULE | Freq: Every day | ORAL | Status: DC
  Filled 2012-03-07 (×2): qty 1

## 2012-03-07 MED ORDER — ONDANSETRON HCL 4 MG/2ML IJ SOLN: 4.0000 mg | Freq: Four times a day (QID) | INTRAMUSCULAR | Status: DC | PRN

## 2012-03-07 MED ORDER — DIAZEPAM 5 MG PO TABS
10.0000 mg | ORAL_TABLET | ORAL | Status: AC
  Administered 2012-03-07: 10 mg via ORAL

## 2012-03-07 MED ORDER — POTASSIUM CHLORIDE CRYS ER 20 MEQ PO TBCR
20.0000 meq | EXTENDED_RELEASE_TABLET | Freq: Every day | ORAL | Status: DC
  Filled 2012-03-07 (×3): qty 1

## 2012-03-07 MED ORDER — ISOSORBIDE MONONITRATE ER 30 MG PO TB24
30.0000 mg | ORAL_TABLET | Freq: Every day | ORAL | Status: DC
  Administered 2012-03-07: 30 mg via ORAL
  Filled 2012-03-07 (×3): qty 1

## 2012-03-07 MED ORDER — SODIUM CHLORIDE 0.9 % IJ SOLN
3.0000 mL | Freq: Two times a day (BID) | INTRAMUSCULAR | Status: DC
Start: 2012-03-07 — End: 2012-03-07

## 2012-03-07 MED ORDER — HEPARIN SODIUM (PORCINE) 1000 UNIT/ML IJ SOLN
INTRAMUSCULAR | Status: AC
  Filled 2012-03-07: qty 1

## 2012-03-07 MED ORDER — VERAPAMIL HCL 2.5 MG/ML IV SOLN
INTRAVENOUS | Status: AC
  Filled 2012-03-07: qty 2

## 2012-03-07 MED ORDER — ASPIRIN 81 MG PO CHEW
324.0000 mg | CHEWABLE_TABLET | ORAL | Status: AC
  Administered 2012-03-07: 324 mg via ORAL

## 2012-03-07 MED ORDER — LISINOPRIL 20 MG PO TABS
20.0000 mg | ORAL_TABLET | Freq: Every day | ORAL | Status: DC
  Filled 2012-03-07 (×2): qty 1

## 2012-03-07 MED ORDER — HEPARIN (PORCINE) IN NACL 2-0.9 UNIT/ML-% IJ SOLN
INTRAMUSCULAR | Status: AC
  Filled 2012-03-07: qty 1000

## 2012-03-07 NOTE — Interval H&P Note (Signed)
History and Physical Interval Note:  03/07/2012 1:31 PM  Rhonda Hughes  has presented today for cardiac cath with the diagnosis of Chest pain  The various methods of treatment have been discussed with the patient and family. After consideration of risks, benefits and other options for treatment, the patient has consented to  Procedure(s): LEFT HEART CATHETERIZATION WITH CORONARY ANGIOGRAM (N/A) as a surgical intervention .  The patient's history has been reviewed, patient examined, no change in status, stable for surgery.  I have reviewed the patient's chart and labs.  Questions were answered to the patient's satisfaction.     MCALHANY,CHRISTOPHER

## 2012-03-07 NOTE — H&P (View-Only) (Signed)
Rhonda Hughes Date of Birth: 14-Aug-1940 Medical Record #[id number]  History of Present Illness: Rhonda Hughes is seen back today for a work in visit. She is seen for Dr. Clifton James. She has known CAD with prior DES to the LAD. Had a stenosis in the LCX as well but was not treated due to her FFR being ok. This was back in September. She has never had a chest pain syndrome - she has dyspnea and fatigue. Her other issues include HLD and HLD. She remains on her Plavix.   She comes in today. She is here alone. She is not doing well. Has had the return of DOE with just minimal activities and significant fatigue. She feels just like she did back in the fall. She has no symptoms with rest. No NTG use.   Current Outpatient Prescriptions on File Prior to Visit  Medication Sig Dispense Refill  . ALPRAZolam (XANAX) 0.25 MG tablet Take 0.25 mg by mouth daily as needed. For anxiety      . aspirin 81 MG chewable tablet Chew 1 tablet (81 mg total) by mouth daily.      Marland Kitchen atorvastatin (LIPITOR) 10 MG tablet Take 10 mg by mouth daily. Take 2 tabs daily      . Biotin 1000 MCG tablet Take 1,000 mcg by mouth daily.       . Calcium Carbonate-Vitamin D (CALCIUM + D) 600-200 MG-UNIT TABS Take 1 tablet by mouth 2 (two) times daily.       . clopidogrel (PLAVIX) 75 MG tablet Take 1 tablet (75 mg total) by mouth daily with breakfast.  90 tablet  2  . lisinopril-hydrochlorothiazide (PRINZIDE,ZESTORETIC) 20-12.5 MG per tablet Take 1 tablet by mouth at bedtime.      . niacin 500 MG tablet Take 500 mg by mouth daily.      . nitroGLYCERIN (NITROSTAT) 0.4 MG SL tablet Place 1 tablet (0.4 mg total) under the tongue every 5 (five) minutes as needed for chest pain.  25 tablet  11  . pantoprazole (PROTONIX) 40 MG tablet Take 1 tablet (40 mg total) by mouth daily.  90 tablet  1  . potassium chloride SA (K-DUR,KLOR-CON) 20 MEQ tablet Take 20 mEq by mouth daily.       Marland Kitchen venlafaxine XR (EFFEXOR-XR) 37.5 MG 24 hr capsule Take 37.5 mg by  mouth at bedtime.      . vitamin B-12 (CYANOCOBALAMIN) 1000 MCG tablet Take 2,000 mcg by mouth daily.      . isosorbide mononitrate (IMDUR) 30 MG 24 hr tablet Take 1 tablet (30 mg total) by mouth daily.  10 tablet  0  . [DISCONTINUED] niacin (NIASPAN) 500 MG CR tablet Take 500 mg by mouth at bedtime.          Allergies  Allergen Reactions  . Adhesive (Tape) Other (See Comments)    Turns skin red  . Sulfa Antibiotics Other (See Comments)    Childhood allergy    Past Medical History  Diagnosis Date  . Hyperlipidemia   . HTN (hypertension)   . Anxiety   . Diverticulosis   . Barrett's esophagus without dysplasia   . Depression   . Rectocele   . CAD (coronary artery disease)     a. LHC 10/16/11: dLM 20%, mLAD 90%, pOM1 30%, mOM1 70% (FFR not hemodynamically significant), prox and mid RCA 30%, EF 65%;  b. PCI 10/16/11: Promus DES x 2 to mLAD    Past Surgical History  Procedure Date  .  Partial hysterectomy 1980s  . Colonoscopy 01/2008    multiple diverticula in desc and sigm colon  . Esophagogastroduodenoscopy 01/2008    Barrett's esophagus, gastritis, no h.pylori, due follow-up 01/2011  . Flexible sigmoidoscopy 08/11/2010 RECTAL PAIN/PRESSURE    SML IH, TICS  . Esophagogastroduodenoscopy 04/06/2011    Procedure: ESOPHAGOGASTRODUODENOSCOPY (EGD);  Surgeon: West Bali, MD;  Location: [facility];  Service: Endoscopy;  Laterality: N/A;  8:30    History  Smoking status  . Former Smoker -- 1.0 packs/day for 53 years  . Types: Cigarettes  . Quit date: 03/20/2007  Smokeless tobacco  . Never Used    History  Alcohol Use No    Family History  Problem Relation Age of Onset  . Coronary artery disease Father 73  . Colon cancer Neg Hx   . GI problems Mother     perforated colon   . Heart attack Father 86  . Coronary artery disease Paternal Aunt   . Coronary artery disease Paternal Uncle     Review of Systems: The review of systems is per the HPI.  All other systems were  reviewed and are negative.  Physical Exam: BP 122/74  Pulse 68  Ht 5\' 1"  (1.549 m)  Wt 134 lb (60.782 kg)  BMI 25.32 kg/m2  SpO2 97% Patient is very pleasant and in no acute distress. Skin is warm and dry. Color is normal.  HEENT is unremarkable. Normocephalic/atraumatic. PERRL. Sclera are nonicteric. Neck is supple. No masses. No JVD. Lungs are clear. Cardiac exam shows a regular rate and rhythm. Abdomen is soft. Extremities are without edema. Gait and ROM are intact. No gross neurologic deficits noted.   LABORATORY DATA: EKG today shows sinus rhythm. No acute changes.   Labs are pending for today.   Angiographic Findings from September 2013:   Left main: The distal vessel has 20% stenosis.  Left Anterior Descending Artery:Moderate sized vessel that courses to the apex. The proximal vessel has diffuse plaque. There is calcification in the proximal and mid vessel. The mid vessel has a 90% stenosis. The diagonal branch is very small.  Circumflex Artery: Moderate sized vessel with moderate sized obtuse marginal branch. The first obtuse marginal branch has 30% proximal stenosis and 70% mid stenosis. The AV groove Circumflex is small in caliber beyond the takeoff of the marginal branch.  Right Coronary Artery: Moderate sized dominant vessel with diffuse 30% stenosis in the proximal and mid vessel. No flow limiting lesions noted.  Left Ventricular Angiogram: LVEF=65%   Impression:  1. 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  2. Preserved LV systolic function   Recommendations: Will move to inpatient cath lab today and perform FFR of the Circumflex and PCI of the LAD. If the Circumflex is flow limiting, will perform PCI of the Circumflex as well.    PCI Impression from September 2013:  1. Unstable angina  2. Successful PTCA/DES x 2 mid LAD  3. Moderate stenosis OM1, FFR of 0.89.   Recommendations: She will need ASA and Plavix for one year. Will continue statin.   Lab Results    Component Value Date   WBC 11.0* 10/20/2011   HGB 12.6 10/20/2011   HCT 36.8 10/20/2011   PLT 302 10/20/2011   GLUCOSE 98 10/20/2011   ALT 18 10/20/2011   AST 23 10/20/2011   NA 133* 10/20/2011   K 3.2* 10/20/2011   CL 94* 10/20/2011   CREATININE 1.15* 10/20/2011   BUN 13 10/20/2011   CO2 28  10/20/2011   TSH 0.875 06/23/2010   INR 1.0 10/14/2011    Assessment / Plan: 1. Return of DOE/fatigue - this appears to be her angina equivalent. We are going to proceed on with repeat cath with Dr. Clifton James next week. I have placed her on Imdur 30 mg daily in the interim. She is to restrict her activities in the interim. No other medicine changes. Checking labs today. Cath is set up for February 13th.   2. HTN - blood pressure looks ok  3. HLD - on statin.   Patient is agreeable to this plan and will call if any problems develop in the interim.

## 2012-03-07 NOTE — CV Procedure (Signed)
Cardiac Catheterization Operative Report  Rhonda Hughes [id number] 2/17/20142:40 PM DAVIDSON,ERIC, MD  Procedure Performed:  1. Left Heart Catheterization 2. Selective Coronary Angiography 3. Left ventricular angiogram 4. PTCA/DES x 1 obtuse marginal branch  Operator: Verne Carrow, MD  Arterial access site:  Right radial artery.   Indication: 72 yo female with history of CAD, HTN, HLD, anxiety, Barrett's esophagus who is here today for cardiac cath. She presented in September 2013 with symptoms c/w unstable angina. Overlapping drug eluting stents were placed in the mid LAD. She had a moderately severe stenosis in the first obtuse marginal branch at that time. FFR of the OM branch was 0.89 at that time. No PCI was performed in the OM branch. She did well for 3 months but has now developed recurrent chest pain and dyspnea with minimal exertion, class 3 angina. Cardiac cath to exclude progression of CAD.                                       Procedure Details: The risks, benefits, complications, treatment options, and expected outcomes were discussed with the patient. The patient and/or family concurred with the proposed plan, giving informed consent. The patient was brought to the cath lab after IV hydration was begun and oral premedication was given. The patient was further sedated with Versed and Fentanyl. The right wrist was assessed with an Allens test which was positive. The right wrist was prepped and draped in a sterile fashion. 1% lidocaine was used for local anesthesia. Using the modified Seldinger access technique, a 5 French sheath was placed in the right radial artery. 3 mg Verapamil was given through the sheath. 3500 units IV heparin was given. Standard diagnostic catheters were used to perform selective coronary angiography. A pigtail catheter was used to perform a left ventricular angiogram. The patient was found to have a severe stenosis in the mid portion of the  first obtuse marginal branch. Given her symptoms, we elected to proceed with intervention of this critical stenosis. She has not tolerated beta blockers secondary to bradycardia. She has been treated with long acting nitrates and a calcium channel blocker with no improvement in symptoms. (2 anti-anginal medications).   The sheath was upsized to a 6 [place] system. She was then given a bolus of Angiomax and a drip was started. I engaged the left main artery with a XB LAD 3.5 guiding catheter. When the ACT was greater than 200, I passed a BMW IC wire down the Circumflex artery. I then directly stented the mid OM branch with a 2.75 x 12 mm Promus Premier DES. This was post-dilated with a 2.75 x 8 mm Bradley balloon. The stenosis was taken from 90% down to 0%. She was given an additional 300 mg Plavix po x 1 at the end of the case.   The sheath was removed from the right radial artery and a Terumo hemostasis band was applied at the arteriotomy site on the right wrist.    There were no immediate complications. The patient was taken to the recovery area in stable condition.   Hemodynamic Findings: Central aortic pressure: 124/59 Left ventricular pressure: 132/9/14  Angiographic Findings:  Left main: The distal vessel has 20-30% stenosis. This was closely examined in multiple views and did not appear to significant.   Left Anterior Descending Artery:Moderate sized vessel that courses to the apex. The proximal vessel has diffuse  plaque. There are patent overlapping stents in the mid segment without restenosis. There is calcification in the proximal and mid vessel.  The diagonal branch is very small.   Circumflex Artery: Moderate sized vessel with moderate sized obtuse marginal branch. The first obtuse marginal branch has 30% proximal stenosis and 90% mid stenosis. The AV groove Circumflex is small in caliber beyond the takeoff of the marginal branch.   Right Coronary Artery: Moderate sized dominant vessel with  diffuse 30% stenosis in the proximal and mid vessel. No flow limiting lesions noted.   Left Ventricular Angiogram: LVEF=65%  Impression: 1. Double vessel CAD with patent stents mid LAD and severe stenosis OM1. 2. Class 3 angina 3. Normal LV systolic function 4. Successful PTCA/DES x 1 OM1  Recommendations: Continue dual antiplatelet therapy with ASA and Plavix for at least one year. She does not tolerate beta blockers secondary to bradycardia. Continue Imdur, Calcium channel blocker.        Complications:  None. The patient tolerated the procedure well.

## 2012-03-07 NOTE — Progress Notes (Signed)
TR BAND REMOVAL  LOCATION:    right radial  DEFLATED PER PROTOCOL:    yes  TIME BAND OFF / DRESSING APPLIED:    1830   SITE UPON ARRIVAL:    Level 0  SITE AFTER BAND REMOVAL:    Level 0  REVERSE ALLEN'S TEST:     positive  CIRCULATION SENSATION AND MOVEMENT:    Within Normal Limits   yes  COMMENTS:   Tolerated procedure well 

## 2012-03-08 ENCOUNTER — Encounter (HOSPITAL_COMMUNITY): Payer: Self-pay | Admitting: Cardiology

## 2012-03-08 DIAGNOSIS — I208 Other forms of angina pectoris: Secondary | ICD-10-CM | POA: Diagnosis present

## 2012-03-08 DIAGNOSIS — I209 Angina pectoris, unspecified: Secondary | ICD-10-CM

## 2012-03-08 DIAGNOSIS — E876 Hypokalemia: Secondary | ICD-10-CM | POA: Diagnosis not present

## 2012-03-08 DIAGNOSIS — I200 Unstable angina: Secondary | ICD-10-CM | POA: Diagnosis present

## 2012-03-08 DIAGNOSIS — I10 Essential (primary) hypertension: Secondary | ICD-10-CM | POA: Diagnosis present

## 2012-03-08 LAB — BASIC METABOLIC PANEL
BUN: 12 mg/dL (ref 6–23)
CO2: 29 mEq/L (ref 19–32)
Chloride: 102 mEq/L (ref 96–112)
Creatinine, Ser: 0.9 mg/dL (ref 0.50–1.10)

## 2012-03-08 LAB — CBC
HCT: 33.4 % — ABNORMAL LOW (ref 36.0–46.0)
MCH: 31.8 pg (ref 26.0–34.0)
MCV: 93 fL (ref 78.0–100.0)
RBC: 3.59 MIL/uL — ABNORMAL LOW (ref 3.87–5.11)
WBC: 7.8 10*3/uL (ref 4.0–10.5)

## 2012-03-08 MED ORDER — POTASSIUM CHLORIDE CRYS ER 20 MEQ PO TBCR
40.0000 meq | EXTENDED_RELEASE_TABLET | Freq: Every day | ORAL | Status: DC
  Administered 2012-03-08: 40 meq via ORAL
  Filled 2012-03-08: qty 1

## 2012-03-08 MED ORDER — CLOPIDOGREL BISULFATE 75 MG PO TABS: 75.0000 mg | ORAL_TABLET | Freq: Every day | ORAL | Status: DC

## 2012-03-08 MED ORDER — LISINOPRIL-HYDROCHLOROTHIAZIDE 20-12.5 MG PO TABS: 1.0000 | ORAL_TABLET | Freq: Every day | ORAL | Status: DC

## 2012-03-08 MED FILL — Dextrose Inj 5%: INTRAVENOUS | Qty: 50 | Status: AC

## 2012-03-08 NOTE — Progress Notes (Signed)
CARDIAC REHAB PHASE I   PRE:  Rate/Rhythm: 64SR  BP:  Supine: 106/38  Sitting:   Standing:    SaO2: 97%RA  MODE:  Ambulation: 800 ft   POST:  Rate/Rhythem: 85SR  BP:  Supine: 121/39  Sitting:   Standing:    SaO2:  0735-0830 Pt walked 800 ft with steady gait. Tolerated well. No c/o CP. To bed after walk. Education completed with pt and husband. Discussed CRP 2 and pt gave permission to refer to [facility].  Duanne Limerick

## 2012-03-08 NOTE — Progress Notes (Addendum)
   Patient Name: IMYA MANCE Date of Encounter: 03/08/2012     Active Problems:   * No active hospital problems. *    SUBJECTIVE  Doing well.  No chest pain.  Ambulating without difficulty.   CURRENT MEDS . aspirin  81 mg Oral Daily  . atorvastatin  10 mg Oral QPM  . clopidogrel  75 mg Oral Q breakfast  . hydrochlorothiazide  12.5 mg Oral Daily  . isosorbide mononitrate  30 mg Oral Daily  . lisinopril  20 mg Oral Daily  . pantoprazole  40 mg Oral Daily  . potassium chloride SA  20 mEq Oral Daily  . venlafaxine XR  37.5 mg Oral QHS    OBJECTIVE  Filed Vitals:   03/08/12 0000 03/08/12 0526 03/08/12 0534 03/08/12 0823  BP: 96/37  103/44 94/33  Pulse: 69  64 66  Temp: 97.9 F (36.6 C) 98.2 F (36.8 C)  97.7 F (36.5 C)  TempSrc: Oral Oral  Oral  Resp: 20  18 18   Height:      Weight:  134 lb 7.7 oz (61 kg)    SpO2: 93% 92% 93% 95%    Intake/Output Summary (Last 24 hours) at 03/08/12 0852 Last data filed at 03/08/12 0000  Gross per 24 hour  Intake    780 ml  Output   1250 ml  Net   -470 ml   Filed Weights   03/07/12 1047 03/08/12 0526  Weight: 134 lb (60.782 kg) 134 lb 7.7 oz (61 kg)    PHYSICAL EXAM  General: Pleasant, NAD. Neuro: Alert and oriented X 3. Moves all extremities spontaneously. Psych: Normal affect. HEENT:  Normal  Neck: Supple without bruits or JVD. Lungs:  Resp regular and unlabored, CTA. Heart: RRR no s3, s4, or murmurs.  Extremities: wrist looks good.    Accessory Clinical Findings  CBC  Recent Labs  03/08/12 0615  WBC 7.8  HGB 11.4*  HCT 33.4*  MCV 93.0  PLT 224   Basic Metabolic Panel  Recent Labs  03/08/12 0615  NA 140  K 3.4*  CL 102  CO2 29  GLUCOSE 110*  BUN 12  CREATININE 0.90  CALCIUM 9.1   Liver Function Tests No results found for this basename: AST, ALT, ALKPHOS, BILITOT, PROT, ALBUMIN,  in the last 72 hours No results found for this basename: LIPASE, AMYLASE,  in the last 72 hours Cardiac  Enzymes No results found for this basename: CKTOTAL, CKMB, CKMBINDEX, TROPONINI,  in the last 72 hours BNP No components found with this basename: POCBNP,  D-Dimer No results found for this basename: DDIMER,  in the last 72 hours Hemoglobin A1C No results found for this basename: HGBA1C,  in the last 72 hours Fasting Lipid Panel No results found for this basename: CHOL, HDL, LDLCALC, TRIG, CHOLHDL, LDLDIRECT,  in the last 72 hours Thyroid Function Tests No results found for this basename: TSH, T4TOTAL, FREET3, T3FREE, THYROIDAB,  in the last 72 hours   ECG  NSR. Anterior MI, age indeterminate.  No acute changes.    Radiology/Studies  No results found.  ASSESSMENT AND PLAN   1.  Exertional AP SP PCI of the CFX 2.  HTN 3. Hypokalemia  Replace K.  Hold HCTZ today and tomorrow.  DC isosorbide per patient request-seems appropriate Home today Discussion re: DAPT, time frame, new stent architecture.  Planning.      Signed, Shawnie Pons MD, [facility], FSCAI

## 2012-03-08 NOTE — Discharge Summary (Signed)
CARDIOLOGY DISCHARGE SUMMARY   Patient ID: Rhonda Hughes MRN: [id number] DOB/AGE: August 10, 1940 72 y.o.  Admit date: 03/07/2012 Discharge date: 03/10/2012  Primary Discharge Diagnosis:    Crescendo angina,  2.75 x 12 mm Promus Premier DES to the OM1  Secondary Discharge Diagnosis:    HTN (hypertension)   Hypokalemia  Procedure Performed:  1. Left Heart Catheterization 2. Selective Coronary Angiography 3. Left ventricular angiogram 4. PTCA/DES x 1 obtuse marginal branch   Hospital Course:  Rhonda Hughes is a 72 year old female with a history of CAD. She was seen in the office and described crescendo angina (Class II-IV) with minimal activity causing symptoms. She was admitted for further evaluation and catheterization.   She was taken to the cath lab on 2/17, with results below. She had a DES to the OM1 and tolerated the procedure well. She is to be on DAPT for 1 year. She was admitted overnight. She had mild hypokalemia after the cath, which was supplemented. She had mild anemia as her Hgb/Hct decreased slightly but this was felt secondary to the procedure and can be followed as an outpatient. She was seen by cardiac rehab and ambulated well with them. She was seen by Dr Riley Kill and medications were reviewed. She is to HOLD the lisinopril/HCTZ for 2 days, restart on 03/11/2012. She was cleared to d/c the Imdur. She was doing well and considered stable for discharge, to follow up as an outpatient.   Labs:   Lab Results  Component Value Date   WBC 7.8 03/08/2012   HGB 11.4* 03/08/2012   HCT 33.4* 03/08/2012   MCV 93.0 03/08/2012   PLT 224 03/08/2012     Recent Labs Lab 03/08/12 0615  NA 140  K 3.4*  CL 102  CO2 29  BUN 12  CREATININE 0.90  CALCIUM 9.1  GLUCOSE 110*     Cardiac Cath: 03/07/2012 Left main: The distal vessel has 20-30% stenosis. This was closely examined in multiple views and did not appear to significant.  Left Anterior Descending Artery:Moderate sized vessel  that courses to the apex. The proximal vessel has diffuse plaque. There are patent overlapping stents in the mid segment without restenosis. There is calcification in the proximal and mid vessel. The diagonal branch is very small.  Circumflex Artery: Moderate sized vessel with moderate sized obtuse marginal branch. The first obtuse marginal branch has 30% proximal stenosis and 90% mid stenosis. The AV groove Circumflex is small in caliber beyond the takeoff of the marginal branch.  Right Coronary Artery: Moderate sized dominant vessel with diffuse 30% stenosis in the proximal and mid vessel. No flow limiting lesions noted.  Left Ventricular Angiogram: LVEF=65%  Impression:  1. Double vessel CAD with patent stents mid LAD and severe stenosis OM1.  2. Class 3 angina  3. Normal LV systolic function  4. Successful PTCA/DES x 1 OM1  Recommendations: Continue dual antiplatelet therapy with ASA and Plavix for at least one year. She does not tolerate beta blockers secondary to bradycardia. Continue Imdur, Calcium channel blocker.   EKG: 08-Mar-2012 05:31:41 [facility] ROUTINE RECORD Normal sinus rhythm Anterior infarct , age undetermined Abnormal ECG 53mm/s 12mm/mV 100Hz  8.0.1 12SL 241 HD CID: 1 Referred by: CHRISTOPHE MCALHANY Unconfirmed Vent. rate 68 BPM PR interval 140 Rhonda QRS duration 88 Rhonda QT/QTc 414/440 Rhonda P-R-T axes 81 63 79   FOLLOW UP PLANS AND APPOINTMENTS Allergies  Allergen Reactions  . Adhesive (Tape) Other (See Comments)    Turns  skin red  . Sulfa Antibiotics Other (See Comments)    Childhood allergy     Medication List    STOP taking these medications       isosorbide mononitrate 30 MG 24 hr tablet  Commonly known as:  IMDUR      TAKE these medications       ALPRAZolam 0.25 MG tablet  Commonly known as:  XANAX  Take 0.25 mg by mouth daily as needed. For anxiety     aspirin 81 MG chewable tablet  Chew 1 tablet (81 mg total) by mouth daily.       atorvastatin 10 MG tablet  Commonly known as:  LIPITOR  Take 10 mg by mouth daily.     Biotin 1000 MCG tablet  Take 1,000 mcg by mouth daily.     Calcium + D 600-200 MG-UNIT Tabs  Generic drug:  Calcium Carbonate-Vitamin D  Take 1 tablet by mouth 2 (two) times daily.     clopidogrel 75 MG tablet  Commonly known as:  PLAVIX  Take 1 tablet (75 mg total) by mouth daily with breakfast.     lisinopril-hydrochlorothiazide 20-12.5 MG per tablet  Commonly known as:  PRINZIDE,ZESTORETIC  Take 1 tablet by mouth at bedtime. HOLD for 2 days, restart on 03/11/2012.     niacin 500 MG tablet  Take 500 mg by mouth daily.     nitroGLYCERIN 0.4 MG SL tablet  Commonly known as:  NITROSTAT  Place 1 tablet (0.4 mg total) under the tongue every 5 (five) minutes as needed for chest pain.     omega-3 acid ethyl esters 1 G capsule  Commonly known as:  LOVAZA  Take 1 g by mouth daily.     pantoprazole 40 MG tablet  Commonly known as:  PROTONIX  Take 1 tablet (40 mg total) by mouth daily.     potassium chloride SA 20 MEQ tablet  Commonly known as:  K-DUR,KLOR-CON  Take 20 mEq by mouth daily.     venlafaxine XR 37.5 MG 24 hr capsule  Commonly known as:  EFFEXOR-XR  Take 37.5 mg by mouth at bedtime.     vitamin B-12 1000 MCG tablet  Commonly known as:  CYANOCOBALAMIN  Take 2,000 mcg by mouth daily.     Vitamin D3 5000 UNITS Caps  Take 1 capsule by mouth daily.        Discharge Orders   Future Orders Complete By Expires     Amb Referral to Cardiac Rehabilitation  As directed     Comments:      Referring to [facility] Phase 2    Diet - low sodium heart healthy  As directed     Increase activity slowly  As directed       Follow-up Information   Follow up with Verne Carrow, MD. (The office will call with an appointment.)    Contact information:   [address] [place] [address] [phone]       BRING ALL MEDICATIONS WITH YOU TO FOLLOW UP  APPOINTMENTS  Time spent with patient to include physician time: 35 min Signed: Theodore Demark 03/10/2012, 8:55 AM Co-Sign MD

## 2012-03-09 ENCOUNTER — Telehealth: Payer: Self-pay | Admitting: *Deleted

## 2012-03-09 NOTE — Telephone Encounter (Signed)
TOC management pc to pt.  

## 2012-03-13 NOTE — Discharge Summary (Signed)
The patient was seen and evaluated.  The nature an outcome of the procedure was reviewed, including DAPT.  She will be ready for discharge today and will follow up in the outpatient clinic.  More than thirty minutes discharge time.

## 2012-03-30 ENCOUNTER — Encounter: Payer: Self-pay | Admitting: Cardiovascular Disease

## 2012-03-30 ENCOUNTER — Ambulatory Visit (INDEPENDENT_AMBULATORY_CARE_PROVIDER_SITE_OTHER): Payer: Medicare Other | Admitting: Cardiovascular Disease

## 2012-03-30 VITALS — BP 125/70 | HR 64 | Ht 61.5 in | Wt 133.0 lb

## 2012-03-30 DIAGNOSIS — I2510 Atherosclerotic heart disease of native coronary artery without angina pectoris: Secondary | ICD-10-CM

## 2012-03-30 NOTE — Patient Instructions (Addendum)
Your physician wants you to follow-up in:  6 months. You will receive a reminder letter in the mail two months in advance. If you don't receive a letter, please call our office to schedule the follow-up appointment.   

## 2012-03-30 NOTE — Progress Notes (Signed)
History of Present Illness: 72 yo female with history of CAD, HTN, HLD, anxiety, Barrett's esophagus who is here today for cardiac follow up. I saw her as a new patient 10/14/11 for evaluation of chest pain. She reported tightness and pains in the center of her chest with exertion, dyspnea and fatigue. She had been seen by Dr. Marchelle Gearing prior her first visit her and her CT chest showed calcifications in the left main and all three major coronary arteries. I arranged a cardiac cath on 10/16/11. She was found to have a moderate 70% stenosis in the first obtuse marginal branch and a 90% stenosis in the mid LAD. FFR of the obtuse marginal stenosis was 0.98 and following infusion of adenosine was 0.89 suggesting this stenosis was not flow limiting. I could not navigate the flow wire into the LAD. This stenosis appeared critical. I then placed overlapping stents in the mid LAD as this was felt to be the culprit lesion. (2.5 x 24 mm Promus Element with more proximal overlapping 2.75 x 8 mm Promus Element DES). She did well following the procedure. She had recurrence of chest pain and was seen by Norma Fredrickson, NP on 02/25/12 and had c/o worsened dypsnea. I arranged a repeat cath on 03/07/12 and a DES was placed in the OM branch.   She is here today for follow up. She is feeling great. No chest pain, chest tightness, SOB, palpitations. She is back to exercising. No bleeding issues.   Primary Care Physician: Carlota Raspberry  Last Lipid Profile: Followed in primary care.   Past Medical History  Diagnosis Date  . Hyperlipidemia   . HTN (hypertension)   . Anxiety   . Diverticulosis   . Barrett's esophagus without dysplasia   . Rectocele   . CAD (coronary artery disease)     a. LHC 10/16/11: dLM 20%, mLAD 90%, pOM1 30%, mOM1 70% (FFR not hemodynamically significant), prox and mid RCA 30%, EF 65%;  b. PCI 10/16/11: Promus DES x 2 to mLAD  . COPD (chronic obstructive pulmonary disease)     mild  . Shortness of  breath     before caediac stent  . Depression   . GERD (gastroesophageal reflux disease)   . Exertional angina 03/08/2012    Past Surgical History  Procedure Laterality Date  . Partial hysterectomy  1980s  . Colonoscopy  01/2008    multiple diverticula in desc and sigm colon  . Esophagogastroduodenoscopy  01/2008    Barrett's esophagus, gastritis, no h.pylori, due follow-up 01/2011  . Flexible sigmoidoscopy  08/11/2010 RECTAL PAIN/PRESSURE    SML IH, TICS  . Esophagogastroduodenoscopy  04/06/2011    Procedure: ESOPHAGOGASTRODUODENOSCOPY (EGD);  Surgeon: West Bali, MD;  Location: [facility];  Service: Endoscopy;  Laterality: N/A;  8:30  . Abdominal hysterectomy    . Cardiac catheterization      Current Outpatient Prescriptions  Medication Sig Dispense Refill  . ALPRAZolam (XANAX) 0.25 MG tablet Take 0.25 mg by mouth daily as needed. For anxiety      . aspirin 81 MG chewable tablet Chew 1 tablet (81 mg total) by mouth daily.      Marland Kitchen atorvastatin (LIPITOR) 10 MG tablet Take 10 mg by mouth daily.       . Biotin 1000 MCG tablet Take 1,000 mcg by mouth daily.       . Calcium Carbonate-Vitamin D (CALCIUM + D) 600-200 MG-UNIT TABS Take 1 tablet by mouth 2 (two) times daily.       Marland Kitchen  Cholecalciferol (VITAMIN D3) 5000 UNITS CAPS Take 1 capsule by mouth daily.      . clopidogrel (PLAVIX) 75 MG tablet Take 1 tablet (75 mg total) by mouth daily with breakfast.  90 tablet  3  . lisinopril-hydrochlorothiazide (PRINZIDE,ZESTORETIC) 20-12.5 MG per tablet Take 1 tablet by mouth at bedtime. HOLD for 2 days, restart on 03/11/2012.  30 tablet  11  . niacin 500 MG tablet Take 500 mg by mouth daily.      . nitroGLYCERIN (NITROSTAT) 0.4 MG SL tablet Place 1 tablet (0.4 mg total) under the tongue every 5 (five) minutes as needed for chest pain.  25 tablet  11  . omega-3 acid ethyl esters (LOVAZA) 1 G capsule Take 1 g by mouth daily.   60 capsule  3  . pantoprazole (PROTONIX) 40 MG tablet Take 1 tablet (40  mg total) by mouth daily.  90 tablet  1  . potassium chloride SA (K-DUR,KLOR-CON) 20 MEQ tablet Take 20 mEq by mouth daily.       Marland Kitchen venlafaxine XR (EFFEXOR-XR) 37.5 MG 24 hr capsule Take 37.5 mg by mouth at bedtime.      . vitamin B-12 (CYANOCOBALAMIN) 1000 MCG tablet Take 2,000 mcg by mouth daily.      . [DISCONTINUED] niacin (NIASPAN) 500 MG CR tablet Take 500 mg by mouth at bedtime.         No current facility-administered medications for this visit.    Allergies  Allergen Reactions  . Adhesive (Tape) Other (See Comments)    Turns skin red  . Sulfa Antibiotics Other (See Comments)    Childhood allergy    History   Social History  . Marital Status: Married    Spouse Name: N/A    Number of Children: 1  . Years of Education: N/A   Occupational History  . Retired Personal assistant     retired  .     Social History Main Topics  . Smoking status: Former Smoker -- 1.00 packs/day for 53 years    Types: Cigarettes    Quit date: 03/20/2007  . Smokeless tobacco: Never Used  . Alcohol Use: No  . Drug Use: No  . Sexually Active: Yes   Other Topics Concern  . Not on file   Social History Narrative  . No narrative on file    Family History  Problem Relation Age of Onset  . Coronary artery disease Father 24  . Colon cancer Neg Hx   . GI problems Mother     perforated colon   . Heart attack Father 75  . Coronary artery disease Paternal Aunt   . Coronary artery disease Paternal Uncle     Review of Systems:  As stated in the HPI and otherwise negative.   BP 125/70  Pulse 64  Ht 5' 1.5" (1.562 m)  Wt 133 lb (60.328 kg)  BMI 24.73 kg/m2  Physical Examination: General: Well developed, well nourished, NAD HEENT: OP clear, mucus membranes moist SKIN: warm, dry. No rashes. Neuro: No focal deficits Musculoskeletal: Muscle strength 5/5 all ext Psychiatric: Mood and affect normal Neck: No JVD, no carotid bruits, no thyromegaly, no lymphadenopathy. Lungs:Clear  bilaterally, no wheezes, rhonci, crackles Cardiovascular: Regular rate and rhythm. No murmurs, gallops or rubs. Abdomen:Soft. Bowel sounds present. Non-tender.  Extremities: No lower extremity edema. Pulses are 2 + in the bilateral DP/PT.  Cardiac Cath 03/07/12:  Left main: The distal vessel has 20-30% stenosis. This was closely examined in multiple views and did not  appear to significant.  Left Anterior Descending Artery:Moderate sized vessel that courses to the apex. The proximal vessel has diffuse plaque. There are patent overlapping stents in the mid segment without restenosis. There is calcification in the proximal and mid vessel. The diagonal branch is very small.  Circumflex Artery: Moderate sized vessel with moderate sized obtuse marginal branch. The first obtuse marginal branch has 30% proximal stenosis and 90% mid stenosis. The AV groove Circumflex is small in caliber beyond the takeoff of the marginal branch.  Right Coronary Artery: Moderate sized dominant vessel with diffuse 30% stenosis in the proximal and mid vessel. No flow limiting lesions noted.  Left Ventricular Angiogram: LVEF=65%  Impression:  1. Double vessel CAD with patent stents mid LAD and severe stenosis OM1.  2. Class 3 angina  3. Normal LV systolic function  4. Successful PTCA/DES x 1 OM1  Assessment and Plan:   1. CAD: Dyspnea improved since last stent. s/p recent placement overlapping drug eluting stents mid LAD 10/16/11 and OM 03/07/12. She will need dual antiplatelet therapy with ASA and Plavix for at least one year. Continue statin. Lipids followed in primary care. BP well controlled.

## 2012-04-28 ENCOUNTER — Other Ambulatory Visit: Payer: Self-pay | Admitting: *Deleted

## 2012-04-28 ENCOUNTER — Encounter: Payer: Self-pay | Admitting: Cardiovascular Disease

## 2012-04-28 MED ORDER — PANTOPRAZOLE SODIUM 40 MG PO TBEC: 40.0000 mg | DELAYED_RELEASE_TABLET | Freq: Every day | ORAL | Status: DC

## 2012-05-17 ENCOUNTER — Encounter: Payer: Self-pay | Admitting: Cardiovascular Disease

## 2012-07-13 ENCOUNTER — Encounter: Payer: Self-pay | Admitting: Cardiovascular Disease

## 2012-07-13 MED ORDER — CLOPIDOGREL BISULFATE 75 MG PO TABS: 75.0000 mg | ORAL_TABLET | Freq: Every day | ORAL | Status: DC

## 2012-07-21 ENCOUNTER — Encounter: Payer: Self-pay | Admitting: Cardiovascular Disease

## 2012-09-28 ENCOUNTER — Ambulatory Visit (INDEPENDENT_AMBULATORY_CARE_PROVIDER_SITE_OTHER): Payer: Medicare Other | Admitting: Cardiovascular Disease

## 2012-09-28 ENCOUNTER — Encounter: Payer: Self-pay | Admitting: Cardiovascular Disease

## 2012-09-28 VITALS — BP 130/50 | HR 50 | Ht 61.0 in | Wt 130.0 lb

## 2012-09-28 DIAGNOSIS — R5381 Other malaise: Secondary | ICD-10-CM

## 2012-09-28 DIAGNOSIS — R5383 Other fatigue: Secondary | ICD-10-CM

## 2012-09-28 DIAGNOSIS — I2510 Atherosclerotic heart disease of native coronary artery without angina pectoris: Secondary | ICD-10-CM

## 2012-09-28 LAB — CBC WITH DIFFERENTIAL/PLATELET
Basophils Relative: 0.5 % (ref 0.0–3.0)
Eosinophils Absolute: 0.1 10*3/uL (ref 0.0–0.7)
MCHC: 33.9 g/dL (ref 30.0–36.0)
MCV: 94 fl (ref 78.0–100.0)
Monocytes Absolute: 0.4 10*3/uL (ref 0.1–1.0)
Neutrophils Relative %: 67.1 % (ref 43.0–77.0)
Platelets: 256 10*3/uL (ref 150.0–400.0)
RBC: 3.96 Mil/uL (ref 3.87–5.11)
RDW: 12.7 % (ref 11.5–14.6)

## 2012-09-28 LAB — BASIC METABOLIC PANEL
BUN: 9 mg/dL (ref 6–23)
CO2: 30 mEq/L (ref 19–32)
Chloride: 98 mEq/L (ref 96–112)
Creatinine, Ser: 0.9 mg/dL (ref 0.4–1.2)
Glucose, Bld: 96 mg/dL (ref 70–99)

## 2012-09-28 NOTE — Patient Instructions (Addendum)
Your physician wants you to follow-up in:  6 months. You will receive a reminder letter in the mail two months in advance. If you don't receive a letter, please call our office to schedule the follow-up appointment.   

## 2012-09-28 NOTE — Progress Notes (Signed)
History of Present Illness: 72 yo female with history of CAD, HTN, HLD, anxiety, Barrett's esophagus who is here today for cardiac follow up. I saw her as a new patient 10/14/11 for evaluation of chest pain. She reported tightness and pains in the center of her chest with exertion, dyspnea and fatigue. She had been seen by Dr. Marchelle Gearing prior her first visit her and her CT chest showed calcifications in the left main and all three major coronary arteries. I arranged a cardiac cath on 10/16/11. She was found to have a moderate 70% stenosis in the first obtuse marginal branch and a 90% stenosis in the mid LAD. FFR of the obtuse marginal stenosis was 0.98 and following infusion of adenosine was 0.89 suggesting this stenosis was not flow limiting. I could not navigate the flow wire into the LAD. This stenosis appeared critical. I then placed overlapping stents in the mid LAD as this was felt to be the culprit lesion. (2.5 x 24 mm Promus Element with more proximal overlapping 2.75 x 8 mm Promus Element DES). She did well following the procedure. She had recurrence of chest pain and was seen by Norma Fredrickson, NP on 02/25/12 and had c/o worsened dypsnea. I arranged a repeat cath on 03/07/12 and a DES was placed in the OM branch.   She is here today for follow up. No chest pain, chest tightness, SOB, palpitations. She is back to exercising. No bleeding issues.   Primary Care Physician: Carlota Raspberry   Last Lipid Profile: Followed in primary care.   Past Medical History  Diagnosis Date  . Hyperlipidemia   . HTN (hypertension)   . Anxiety   . Diverticulosis   . Barrett's esophagus without dysplasia   . Rectocele   . CAD (coronary artery disease)     a. LHC 10/16/11: dLM 20%, mLAD 90%, pOM1 30%, mOM1 70% (FFR not hemodynamically significant), prox and mid RCA 30%, EF 65%;  b. PCI 10/16/11: Promus DES x 2 to mLAD  . COPD (chronic obstructive pulmonary disease)     mild  . Shortness of breath     before  caediac stent  . Depression   . GERD (gastroesophageal reflux disease)   . Exertional angina 03/08/2012    Past Surgical History  Procedure Laterality Date  . Partial hysterectomy  1980s  . Colonoscopy  01/2008    multiple diverticula in desc and sigm colon  . Esophagogastroduodenoscopy  01/2008    Barrett's esophagus, gastritis, no h.pylori, due follow-up 01/2011  . Flexible sigmoidoscopy  08/11/2010 RECTAL PAIN/PRESSURE    SML IH, TICS  . Esophagogastroduodenoscopy  04/06/2011    Procedure: ESOPHAGOGASTRODUODENOSCOPY (EGD);  Surgeon: West Bali, MD;  Location: [facility];  Service: Endoscopy;  Laterality: N/A;  8:30  . Abdominal hysterectomy    . Cardiac catheterization      Current Outpatient Prescriptions  Medication Sig Dispense Refill  . ALPRAZolam (XANAX) 0.25 MG tablet Take 0.25 mg by mouth daily as needed. For anxiety      . aspirin 81 MG chewable tablet Chew 1 tablet (81 mg total) by mouth daily.      Marland Kitchen atorvastatin (LIPITOR) 10 MG tablet Take 10 mg by mouth daily.       . Biotin 1000 MCG tablet Take 1,000 mcg by mouth daily.       . Calcium Carbonate-Vitamin D (CALCIUM + D) 600-200 MG-UNIT TABS Take 1 tablet by mouth 2 (two) times daily.       Marland Kitchen  Cholecalciferol (VITAMIN D3) 5000 UNITS CAPS Take 1 capsule by mouth daily.      . clopidogrel (PLAVIX) 75 MG tablet Take 1 tablet (75 mg total) by mouth daily with breakfast.  90 tablet  3  . lisinopril-hydrochlorothiazide (PRINZIDE,ZESTORETIC) 20-12.5 MG per tablet Take 1 tablet by mouth at bedtime.      . niacin 500 MG tablet Take 500 mg by mouth daily.      . nitroGLYCERIN (NITROSTAT) 0.4 MG SL tablet Place 1 tablet (0.4 mg total) under the tongue every 5 (five) minutes as needed for chest pain.  25 tablet  11  . omega-3 acid ethyl esters (LOVAZA) 1 G capsule Take 1 g by mouth daily.   60 capsule  3  . pantoprazole (PROTONIX) 40 MG tablet Take 1 tablet (40 mg total) by mouth daily.  90 tablet  3  . potassium chloride SA  (K-DUR,KLOR-CON) 20 MEQ tablet Take 20 mEq by mouth daily.       Marland Kitchen venlafaxine XR (EFFEXOR-XR) 37.5 MG 24 hr capsule Take 37.5 mg by mouth at bedtime.      . vitamin B-12 (CYANOCOBALAMIN) 1000 MCG tablet Take 2,000 mcg by mouth daily.      . [DISCONTINUED] niacin (NIASPAN) 500 MG CR tablet Take 500 mg by mouth at bedtime.         No current facility-administered medications for this visit.    Allergies  Allergen Reactions  . Adhesive [Tape] Other (See Comments)    Turns skin red  . Sulfa Antibiotics Other (See Comments)    Childhood allergy    History   Social History  . Marital Status: Married    Spouse Name: N/A    Number of Children: 1  . Years of Education: N/A   Occupational History  . Retired Personal assistant     retired  .     Social History Main Topics  . Smoking status: Former Smoker -- 1.00 packs/day for 53 years    Types: Cigarettes    Quit date: 03/20/2007  . Smokeless tobacco: Never Used  . Alcohol Use: No  . Drug Use: No  . Sexual Activity: Yes   Other Topics Concern  . Not on file   Social History Narrative  . No narrative on file    Family History  Problem Relation Age of Onset  . Coronary artery disease Father 76  . Colon cancer Neg Hx   . GI problems Mother     perforated colon   . Heart attack Father 27  . Coronary artery disease Paternal Aunt   . Coronary artery disease Paternal Uncle     Review of Systems:  As stated in the HPI and otherwise negative.   BP 130/50  Pulse 50  Ht 5\' 1"  (1.549 m)  Wt 130 lb (58.968 kg)  BMI 24.58 kg/m2  Physical Examination: General: Well developed, well nourished, NAD HEENT: OP clear, mucus membranes moist SKIN: warm, dry. No rashes. Neuro: No focal deficits Musculoskeletal: Muscle strength 5/5 all ext Psychiatric: Mood and affect normal Neck: No JVD, no carotid bruits, no thyromegaly, no lymphadenopathy. Lungs:Clear bilaterally, no wheezes, rhonci, crackles Cardiovascular: Regular rate  and rhythm. No murmurs, gallops or rubs. Abdomen:Soft. Bowel sounds present. Non-tender.  Extremities: No lower extremity edema. Pulses are 2 + in the bilateral DP/PT.  Cardiac Cath 03/07/12:  Left main: The distal vessel has 20-30% stenosis. This was closely examined in multiple views and did not appear to significant.  Left Anterior Descending Artery:Moderate  sized vessel that courses to the apex. The proximal vessel has diffuse plaque. There are patent overlapping stents in the mid segment without restenosis. There is calcification in the proximal and mid vessel. The diagonal branch is very small.  Circumflex Artery: Moderate sized vessel with moderate sized obtuse marginal branch. The first obtuse marginal branch has 30% proximal stenosis and 90% mid stenosis. The AV groove Circumflex is small in caliber beyond the takeoff of the marginal branch.  Right Coronary Artery: Moderate sized dominant vessel with diffuse 30% stenosis in the proximal and mid vessel. No flow limiting lesions noted.  Left Ventricular Angiogram: LVEF=65%  Impression:  1. Double vessel CAD with patent stents mid LAD and severe stenosis OM1.  2. Class 3 angina  3. Normal LV systolic function  4. Successful PTCA/DES x 1 OM1   Assessment and Plan:   1. CAD: Stable. s/p overlapping drug eluting stents mid LAD 10/16/11 and OM 03/07/12. She will need dual antiplatelet therapy with ASA and Plavix for at least one year. Continue statin. Lipids followed in primary care. BP well controlled.   2. Fatigue: No recent illnesses. No fever, chills. No bleeding. Will check TSH, CBC, BMET.

## 2012-09-29 ENCOUNTER — Other Ambulatory Visit: Payer: Self-pay | Admitting: *Deleted

## 2012-09-29 DIAGNOSIS — E876 Hypokalemia: Secondary | ICD-10-CM

## 2012-10-05 ENCOUNTER — Other Ambulatory Visit: Payer: Self-pay | Admitting: *Deleted

## 2012-10-05 ENCOUNTER — Other Ambulatory Visit (INDEPENDENT_AMBULATORY_CARE_PROVIDER_SITE_OTHER): Payer: Medicare Other

## 2012-10-05 DIAGNOSIS — E876 Hypokalemia: Secondary | ICD-10-CM

## 2012-10-05 LAB — BASIC METABOLIC PANEL
Calcium: 9.4 mg/dL (ref 8.4–10.5)
GFR: 58.23 mL/min — ABNORMAL LOW (ref >60.00)
Glucose, Bld: 81 mg/dL (ref 70–99)
Sodium: 135 mEq/L (ref 135–145)

## 2012-10-05 MED ORDER — POTASSIUM CHLORIDE CRYS ER 20 MEQ PO TBCR: 40.0000 meq | EXTENDED_RELEASE_TABLET | Freq: Every day | ORAL | Status: DC

## 2012-10-19 ENCOUNTER — Other Ambulatory Visit (INDEPENDENT_AMBULATORY_CARE_PROVIDER_SITE_OTHER): Payer: Medicare Other

## 2012-10-19 DIAGNOSIS — E876 Hypokalemia: Secondary | ICD-10-CM

## 2012-10-19 LAB — BASIC METABOLIC PANEL
Calcium: 9.8 mg/dL (ref 8.4–10.5)
Chloride: 96 mEq/L (ref 96–112)
Creatinine, Ser: 1 mg/dL (ref 0.4–1.2)

## 2012-11-24 ENCOUNTER — Other Ambulatory Visit: Payer: Self-pay

## 2012-11-27 ENCOUNTER — Encounter: Payer: Self-pay | Admitting: Cardiovascular Disease

## 2012-11-28 ENCOUNTER — Telehealth: Payer: Self-pay | Admitting: Cardiovascular Disease

## 2012-11-28 NOTE — Telephone Encounter (Signed)
Spoke with pt about my chart message she sent. In message she noted increased shortness of breath and feeling tired. She had seen primary care and was told she may need a stress test.  Pt saw Dr. Clifton James on Sept. 10, 2014 and was feeling well except for fatigue. She states fatigue has gotten worse over last 2 weeks. No energy and feels worn out. No chest pain but increased shortness of breath. Symptoms similar to earlier this year prior to stent.  Appt made for pt to see Dr. Clifton James on November 30, 2012 at 4:00

## 2012-11-28 NOTE — Telephone Encounter (Signed)
Agree. cdm 

## 2012-11-28 NOTE — Telephone Encounter (Signed)
Follow UP  Pt states she is returning Pt's call. Please advise

## 2012-11-30 ENCOUNTER — Ambulatory Visit (INDEPENDENT_AMBULATORY_CARE_PROVIDER_SITE_OTHER): Payer: Medicare Other | Admitting: Cardiovascular Disease

## 2012-11-30 ENCOUNTER — Encounter: Payer: Self-pay | Admitting: Cardiovascular Disease

## 2012-11-30 VITALS — BP 138/72 | HR 54 | Ht 61.0 in | Wt 132.4 lb

## 2012-11-30 DIAGNOSIS — E876 Hypokalemia: Secondary | ICD-10-CM

## 2012-11-30 DIAGNOSIS — R5381 Other malaise: Secondary | ICD-10-CM

## 2012-11-30 DIAGNOSIS — R5383 Other fatigue: Secondary | ICD-10-CM

## 2012-11-30 DIAGNOSIS — I2510 Atherosclerotic heart disease of native coronary artery without angina pectoris: Secondary | ICD-10-CM

## 2012-11-30 NOTE — Progress Notes (Signed)
 History of Present Illness: 72 yo female with history of CAD, HTN, HLD, anxiety, Barrett's esophagus who is here today for cardiac follow up. I saw her as a new patient 10/14/11 for evaluation of chest pain. She reported tightness and pains in the center of her chest with exertion, dyspnea and fatigue. She had been seen by Dr. Ramaswamy prior her first visit her and her CT chest showed calcifications in the left main and all three major coronary arteries. I arranged a cardiac cath on 10/16/11. She was found to have a moderate 70% stenosis in the first obtuse marginal branch and a 90% stenosis in the mid LAD. FFR of the obtuse marginal stenosis was 0.98 and following infusion of adenosine was 0.89 suggesting this stenosis was not flow limiting. I could not navigate the flow wire into the LAD. This stenosis appeared critical. I then placed overlapping stents in the mid LAD as this was felt to be the culprit lesion. (2.5 x 24 mm Promus Element with more proximal overlapping 2.75 x 8 mm Promus Element DES). She did well following the procedure. She had recurrence of chest pain and was seen by Lori Gerhardt, NP on 02/25/12 and had c/o worsened dypsnea. I arranged a repeat cath on 03/07/12 and a 2.75 x 12 mm Promus Premier DES was placed in the OM branch.   She is here today for follow up. No chest pain, chest tightness but she is very fatigued. She also describes dyspnea. No near syncope or syncope. Last office visit in September 2014 with same complaints. TSH and CBC ok at that time. Potassium was low so Kdur increased.   Primary Care Physician: Eric Davidson   Last Lipid Profile: Followed in primary care.   Past Medical History  Diagnosis Date  . Hyperlipidemia   . HTN (hypertension)   . Anxiety   . Diverticulosis   . Barrett's esophagus without dysplasia   . Rectocele   . CAD (coronary artery disease)     a. LHC 10/16/11: dLM 20%, mLAD 90%, pOM1 30%, mOM1 70% (FFR not hemodynamically significant), prox  and mid RCA 30%, EF 65%;  b. PCI 10/16/11: Promus DES x 2 to mLAD  . COPD (chronic obstructive pulmonary disease)     mild  . Shortness of breath     before caediac stent  . Depression   . GERD (gastroesophageal reflux disease)   . Exertional angina 03/08/2012    Past Surgical History  Procedure Laterality Date  . Partial hysterectomy  1980s  . Colonoscopy  01/2008    multiple diverticula in desc and sigm colon  . Esophagogastroduodenoscopy  01/2008    Barrett's esophagus, gastritis, no h.pylori, due follow-up 01/2011  . Flexible sigmoidoscopy  08/11/2010 RECTAL PAIN/PRESSURE    SML IH, TICS  . Esophagogastroduodenoscopy  04/06/2011    Procedure: ESOPHAGOGASTRODUODENOSCOPY (EGD);  Surgeon: Sandi L Fields, MD;  Location: [facility];  Service: Endoscopy;  Laterality: N/A;  8:30  . Abdominal hysterectomy    . Cardiac catheterization      Current Outpatient Prescriptions  Medication Sig Dispense Refill  . ALPRAZolam (XANAX) 0.25 MG tablet Take 0.25 mg by mouth daily as needed. For anxiety      . aspirin 81 MG chewable tablet Chew 1 tablet (81 mg total) by mouth daily.      . atorvastatin (LIPITOR) 10 MG tablet Take 10 mg by mouth daily.       . Biotin 1000 MCG tablet Take 1,000 mcg by mouth   daily.       . Calcium Carbonate-Vitamin D (CALCIUM + D) 600-200 MG-UNIT TABS Take 1 tablet by mouth 2 (two) times daily.       . Cholecalciferol (VITAMIN D3) 5000 UNITS CAPS Take 1 capsule by mouth daily.      . clopidogrel (PLAVIX) 75 MG tablet Take 1 tablet (75 mg total) by mouth daily with breakfast.  90 tablet  3  . lisinopril-hydrochlorothiazide (PRINZIDE,ZESTORETIC) 20-12.5 MG per tablet Take 1 tablet by mouth at bedtime.      . niacin 500 MG tablet Take 500 mg by mouth daily.      . omega-3 acid ethyl esters (LOVAZA) 1 G capsule Take 1 g by mouth daily.   60 capsule  3  . pantoprazole (PROTONIX) 40 MG tablet Take 1 tablet (40 mg total) by mouth daily.  90 tablet  3  . potassium chloride SA  (K-DUR,KLOR-CON) 20 MEQ tablet Take 2 tablets (40 mEq total) by mouth daily.  180 tablet  2  . venlafaxine XR (EFFEXOR-XR) 37.5 MG 24 hr capsule Take 37.5 mg by mouth at bedtime.      . vitamin B-12 (CYANOCOBALAMIN) 1000 MCG tablet Take 2,000 mcg by mouth daily.      . nitroGLYCERIN (NITROSTAT) 0.4 MG SL tablet Place 1 tablet (0.4 mg total) under the tongue every 5 (five) minutes as needed for chest pain.  25 tablet  11  . [DISCONTINUED] niacin (NIASPAN) 500 MG CR tablet Take 500 mg by mouth at bedtime.         No current facility-administered medications for this visit.    Allergies  Allergen Reactions  . Adhesive [Tape] Other (See Comments)    Turns skin red  . Sulfa Antibiotics Other (See Comments)    Childhood allergy    History   Social History  . Marital Status: Married    Spouse Name: N/A    Number of Children: 1  . Years of Education: N/A   Occupational History  . Retired hairdresser/instructor     retired  .     Social History Main Topics  . Smoking status: Former Smoker -- 1.00 packs/day for 53 years    Types: Cigarettes    Quit date: 03/20/2007  . Smokeless tobacco: Never Used  . Alcohol Use: No  . Drug Use: No  . Sexual Activity: Yes   Other Topics Concern  . Not on file   Social History Narrative  . No narrative on file    Family History  Problem Relation Age of Onset  . Coronary artery disease Father 50  . Colon cancer Neg Hx   . GI problems Mother     perforated colon   . Heart attack Father 50  . Coronary artery disease Paternal Aunt   . Coronary artery disease Paternal Uncle     Review of Systems:  As stated in the HPI and otherwise negative.   BP 138/72  Pulse 54  Ht 5' 1" (1.549 m)  Wt 132 lb 6.4 oz (60.056 kg)  BMI 25.03 kg/m2  Physical Examination: General: Well developed, well nourished, NAD HEENT: OP clear, mucus membranes moist SKIN: warm, dry. No rashes. Neuro: No focal deficits Musculoskeletal: Muscle strength 5/5 all  ext Psychiatric: Mood and affect normal Neck: No JVD, no carotid bruits, no thyromegaly, no lymphadenopathy. Lungs:Clear bilaterally, no wheezes, rhonci, crackles Cardiovascular: Regular rate and rhythm. No murmurs, gallops or rubs. Abdomen:Soft. Bowel sounds present. Non-tender.  Extremities: No lower extremity edema. Pulses   are 2 + in the bilateral DP/PT.  Cardiac Cath 03/07/12:  Left main: The distal vessel has 20-30% stenosis. This was closely examined in multiple views and did not appear to significant.  Left Anterior Descending Artery:Moderate sized vessel that courses to the apex. The proximal vessel has diffuse plaque. There are patent overlapping stents in the mid segment without restenosis. There is calcification in the proximal and mid vessel. The diagonal branch is very small.  Circumflex Artery: Moderate sized vessel with moderate sized obtuse marginal branch. The first obtuse marginal branch has 30% proximal stenosis and 90% mid stenosis. The AV groove Circumflex is small in caliber beyond the takeoff of the marginal branch.  Right Coronary Artery: Moderate sized dominant vessel with diffuse 30% stenosis in the proximal and mid vessel. No flow limiting lesions noted.  Left Ventricular Angiogram: LVEF=65%  Impression:  1. Double vessel CAD with patent stents mid LAD and severe stenosis OM1.  2. Class 3 angina  3. Normal LV systolic function  4. Successful PTCA/DES x 1 OM1   Assessment and Plan:   1. CAD: Recent worsened fatigue similar to how she felt before her stents were placed. She s/p overlapping drug eluting stents mid LAD 10/16/11 and OM 03/07/12. She will need continued dual antiplatelet therapy with ASA and Plavix. Continue statin. Lipids followed in primary care. BP well controlled. Will arrange stress test with recent fatigue to exclude ischemia.   2. Fatigue: No recent illnesses. No fever, chills. No bleeding. TSH, CBC, BMET ok September 2014 except for slightly low  potassium. See above. Will plan stress test to exclude ischemia.   3. Hypokalemia: She is on replacement therapy.  

## 2012-11-30 NOTE — Patient Instructions (Signed)
Your physician recommends that you schedule a follow-up appointment in: 4-5 weeks.    Your physician has requested that you have an exercise stress myoview. For further information please visit [email or web address] Please follow instruction sheet, as given.

## 2012-12-08 ENCOUNTER — Ambulatory Visit (HOSPITAL_COMMUNITY)
Admission: RE | Admit: 2012-12-08 | Discharge: 2012-12-08 | Disposition: A | Discharge status: Home / Self Care | Payer: Medicare Other | Source: Ambulatory Visit | Attending: Cardiovascular Disease | Admitting: Cardiovascular Disease

## 2012-12-08 ENCOUNTER — Telehealth: Payer: Self-pay | Admitting: *Deleted

## 2012-12-08 DIAGNOSIS — I2510 Atherosclerotic heart disease of native coronary artery without angina pectoris: Secondary | ICD-10-CM

## 2012-12-08 DIAGNOSIS — R5383 Other fatigue: Secondary | ICD-10-CM | POA: Insufficient documentation

## 2012-12-08 DIAGNOSIS — R5381 Other malaise: Secondary | ICD-10-CM | POA: Insufficient documentation

## 2012-12-08 MED ORDER — TECHNETIUM TC 99M SESTAMIBI GENERIC - CARDIOLITE
10.0000 | Freq: Once | INTRAVENOUS | Status: AC | PRN
  Administered 2012-12-08: 10 via INTRAVENOUS

## 2012-12-08 MED ORDER — TECHNETIUM TC 99M SESTAMIBI GENERIC - CARDIOLITE
30.0000 | Freq: Once | INTRAVENOUS | Status: AC | PRN
  Administered 2012-12-08: 30 via INTRAVENOUS

## 2012-12-08 NOTE — Telephone Encounter (Signed)
I tried to reach pt this afternoon to discuss stress test results but cell phone was not answered and no voicemail. Home number does not accept unidentified calls.  My chart message sent to pt requesting she call office tomorrow AM.

## 2012-12-08 NOTE — Procedures (Addendum)
 [facility] [address] [place] [address] [phone]  Cardiology Nuclear Med Study  Rhonda Hughes is a 72 y.o. female     MRN : [id number]     DOB: Jan 08, 1941  Procedure Date: 12/08/2012  Nuclear Med Background Indication for Stress Test:  Stent Patency History:  COPD and CAD;STENT/PTCA--10/16/2011 AND 03/07/2012 Cardiac Risk Factors: Family History - CAD, History of Smoking, Hypertension and Lipids  Symptoms:  Chest Pain, DOE and Fatigue   Nuclear Pre-Procedure Caffeine/Decaff Intake:  8:00pm NPO After: 6:00am   IV Site: R Forearm  IV 0.9% NS with Angio Cath:  22g  Chest Size (in):  N/A IV Started by: Emmit Pomfret, RN  Height: 5\' 1"  (1.549 m)  Cup Size: C  BMI:  Body mass index is 24.01 kg/(m^2). Weight:  127 lb (57.607 kg)   Tech Comments:  N/A    Nuclear Med Study 1 or 2 day study: 1 day  Stress Test Type:  Stress  Order Authorizing Provider:  C. McAlhany, MD   Resting Radionuclide: Technetium 77m Sestamibi  Resting Radionuclide Dose: 9.9 mCi   Stress Radionuclide:  Technetium 84m Sestamibi  Stress Radionuclide Dose: 30.6 mCi           Stress Protocol Rest HR: 60 Stress HR: 150  Rest BP: 130/71 Stress BP: 206/71  Exercise Time (min): 6:30 METS: 7.0   Predicted Max HR: 150 bpm % Max HR: 100 bpm Rate Pressure Product: 21308  Dose of Adenosine (mg):  n/a Dose of Lexiscan: n/a mg  Dose of Atropine (mg): n/a Dose of Dobutamine: n/a mcg/kg/min (at max HR)  Stress Test Technologist: Esperanza Sheets, CCT Nuclear Technologist: Koren Shiver, CNMT   Rest Procedure:  Myocardial perfusion imaging was performed at rest 45 minutes following the intravenous administration of Technetium 41m Sestamibi. Stress Procedure:  The patient performed treadmill exercise using a Bruce  Protocol for 6:30 minutes. The patient stopped due to SOB, leg discomfort and fatigue and denied any chest pain.  There were significant  ST-T wave changes.  Technetium 63m Sestamibi was injected at peak exercise and myocardial perfusion imaging was performed after a brief delay.  Transient Ischemic Dilatation (Normal <1.22):  0.84 Lung/Heart Ratio (Normal <0.45):  0.24 QGS EDV:  39 ml QGS ESV:  9 ml LV Ejection Fraction: 78%        Rest ECG: NSR - Normal EKG  Stress ECG: Mild upsloping ST depression is seen during stress. There is 1 mm ST depression in the inferolateral leads during late recovery.  QPS Raw Data Images:  Mild breast attenuation.  Normal left ventricular size. Stress Images:  Moderately reduced anterospetal trace uptake is noted. The apex is spared Rest Images:  Comparison with the stress images reveals no significant change. Subtraction (SDS):  No evidence of ischemia.  Impression Exercise Capacity:  Fair exercise capacity. BP Response:  Hypertensive blood pressure response. Clinical Symptoms:  There is dyspnea. ECG Impression:  Significant ST abnormalities consistent with ischemia. Comparison with Prior Nuclear Study: No previous nuclear study performed  Overall Impression:  Low risk stress nuclear study with a fixed septal defect. This could represent a small area of nontransmural infarction or resting ischemia (consider a "jailed" septal branch). The location is not typical for an attenuation artifact. Reversible ischemia is not seen..  LV Wall Motion:  NL LV Function; NL Wall Motion   Rye Decoste, MD  12/08/2012 12:52 PM

## 2012-12-09 ENCOUNTER — Encounter (HOSPITAL_COMMUNITY): Payer: Self-pay | Admitting: Pharmacy Technician

## 2012-12-09 ENCOUNTER — Encounter: Payer: Self-pay | Admitting: *Deleted

## 2012-12-09 NOTE — Telephone Encounter (Signed)
Spoke with pt and reviewed stress test results with her. She would like to proceed with cath. Cath arranged for December 14, 2012. Pt will come in for lab work on December 12, 2012. I verbally went over all instructions with pt and she verbalized understanding. Will leave cath instructions at front desk for pt to pick up when here for labs.

## 2012-12-09 NOTE — Addendum Note (Signed)
Addended by: Verne Carrow D on: 12/09/2012 03:11 PM   Modules accepted: Orders

## 2012-12-12 ENCOUNTER — Other Ambulatory Visit (INDEPENDENT_AMBULATORY_CARE_PROVIDER_SITE_OTHER): Payer: Medicare Other

## 2012-12-12 DIAGNOSIS — I2510 Atherosclerotic heart disease of native coronary artery without angina pectoris: Secondary | ICD-10-CM

## 2012-12-12 LAB — BASIC METABOLIC PANEL
BUN: 11 mg/dL (ref 6–23)
Calcium: 9.7 mg/dL (ref 8.4–10.5)
Chloride: 98 mEq/L (ref 96–112)
Creatinine, Ser: 1.1 mg/dL (ref 0.4–1.2)

## 2012-12-12 LAB — PROTIME-INR
INR: 1 ratio (ref 0.8–1.0)
Prothrombin Time: 10.8 s (ref 10.2–12.4)

## 2012-12-12 LAB — CBC WITH DIFFERENTIAL/PLATELET
Basophils Absolute: 0 10*3/uL (ref 0.0–0.1)
Basophils Relative: 0.3 % (ref 0.0–3.0)
Eosinophils Absolute: 0.1 10*3/uL (ref 0.0–0.7)
Eosinophils Relative: 1.5 % (ref 0.0–5.0)
HCT: 38.7 % (ref 36.0–46.0)
MCV: 95 fl (ref 78.0–100.0)
Monocytes Absolute: 0.4 10*3/uL (ref 0.1–1.0)
Neutrophils Relative %: 73.7 % (ref 43.0–77.0)
Platelets: 270 10*3/uL (ref 150.0–400.0)
RDW: 12.8 % (ref 11.5–14.6)
WBC: 9.1 10*3/uL (ref 4.5–10.5)

## 2012-12-14 ENCOUNTER — Encounter (HOSPITAL_COMMUNITY)
Admission: RE | Disposition: A | Discharge status: Home / Self Care | Payer: Self-pay | Source: Ambulatory Visit | Attending: Cardiovascular Disease

## 2012-12-14 ENCOUNTER — Ambulatory Visit (HOSPITAL_COMMUNITY)
Admission: RE | Admit: 2012-12-14 | Discharge: 2012-12-14 | Disposition: A | Discharge status: Home / Self Care | Payer: Medicare Other | Source: Ambulatory Visit | Attending: Cardiovascular Disease | Admitting: Cardiovascular Disease

## 2012-12-14 ENCOUNTER — Other Ambulatory Visit: Payer: Self-pay | Admitting: *Deleted

## 2012-12-14 DIAGNOSIS — F411 Generalized anxiety disorder: Secondary | ICD-10-CM | POA: Insufficient documentation

## 2012-12-14 DIAGNOSIS — I10 Essential (primary) hypertension: Secondary | ICD-10-CM | POA: Insufficient documentation

## 2012-12-14 DIAGNOSIS — K2270 Barrett's esophagus without dysplasia: Secondary | ICD-10-CM | POA: Insufficient documentation

## 2012-12-14 DIAGNOSIS — I2510 Atherosclerotic heart disease of native coronary artery without angina pectoris: Secondary | ICD-10-CM | POA: Insufficient documentation

## 2012-12-14 DIAGNOSIS — Z9861 Coronary angioplasty status: Secondary | ICD-10-CM | POA: Insufficient documentation

## 2012-12-14 DIAGNOSIS — E785 Hyperlipidemia, unspecified: Secondary | ICD-10-CM | POA: Insufficient documentation

## 2012-12-14 HISTORY — PX: LEFT HEART CATHETERIZATION WITH CORONARY ANGIOGRAM: SHX5451

## 2012-12-14 LAB — POCT ACTIVATED CLOTTING TIME: Activated Clotting Time: 196 seconds

## 2012-12-14 SURGERY — LEFT HEART CATHETERIZATION WITH CORONARY ANGIOGRAM
Anesthesia: LOCAL

## 2012-12-14 MED ORDER — FENTANYL CITRATE 0.05 MG/ML IJ SOLN
INTRAMUSCULAR | Status: AC
  Filled 2012-12-14: qty 2

## 2012-12-14 MED ORDER — SODIUM CHLORIDE 0.9 % IJ SOLN: 3.0000 mL | Freq: Two times a day (BID) | INTRAMUSCULAR | Status: DC

## 2012-12-14 MED ORDER — SODIUM CHLORIDE 0.9 % IV SOLN
INTRAVENOUS | Status: DC
  Administered 2012-12-14: 09:00:00 via INTRAVENOUS

## 2012-12-14 MED ORDER — ASPIRIN 81 MG PO CHEW
CHEWABLE_TABLET | ORAL | Status: AC
  Filled 2012-12-14: qty 1

## 2012-12-14 MED ORDER — MIDAZOLAM HCL 2 MG/2ML IJ SOLN
INTRAMUSCULAR | Status: AC
  Filled 2012-12-14: qty 2

## 2012-12-14 MED ORDER — ONDANSETRON HCL 4 MG/2ML IJ SOLN
INTRAMUSCULAR | Status: AC
  Filled 2012-12-14: qty 2

## 2012-12-14 MED ORDER — DIAZEPAM 5 MG PO TABS
5.0000 mg | ORAL_TABLET | ORAL | Status: AC
  Administered 2012-12-14: 5 mg via ORAL

## 2012-12-14 MED ORDER — NITROGLYCERIN 0.2 MG/ML ON CALL CATH LAB
INTRAVENOUS | Status: AC
  Filled 2012-12-14: qty 1

## 2012-12-14 MED ORDER — VERAPAMIL HCL 2.5 MG/ML IV SOLN
INTRAVENOUS | Status: AC
  Filled 2012-12-14: qty 2

## 2012-12-14 MED ORDER — SODIUM CHLORIDE 0.9 % IJ SOLN: 3.0000 mL | INTRAMUSCULAR | Status: DC | PRN

## 2012-12-14 MED ORDER — HEPARIN SODIUM (PORCINE) 1000 UNIT/ML IJ SOLN
INTRAMUSCULAR | Status: AC
  Filled 2012-12-14: qty 1

## 2012-12-14 MED ORDER — LIDOCAINE HCL (PF) 1 % IJ SOLN
INTRAMUSCULAR | Status: AC
  Filled 2012-12-14: qty 30

## 2012-12-14 MED ORDER — HEPARIN (PORCINE) IN NACL 2-0.9 UNIT/ML-% IJ SOLN
INTRAMUSCULAR | Status: AC
  Filled 2012-12-14: qty 1000

## 2012-12-14 MED ORDER — DIAZEPAM 5 MG PO TABS
ORAL_TABLET | ORAL | Status: AC
  Administered 2012-12-14: 5 mg via ORAL
  Filled 2012-12-14: qty 1

## 2012-12-14 MED ORDER — ISOSORBIDE MONONITRATE ER 30 MG PO TB24: 30.0000 mg | ORAL_TABLET | Freq: Every day | ORAL | Status: DC

## 2012-12-14 MED ORDER — SODIUM CHLORIDE 0.9 % IV SOLN: 250.0000 mL | INTRAVENOUS | Status: DC | PRN

## 2012-12-14 MED ORDER — ASPIRIN 81 MG PO CHEW: 81.0000 mg | CHEWABLE_TABLET | ORAL | Status: DC

## 2012-12-14 NOTE — H&P (View-Only) (Signed)
History of Present Illness: 72 yo female with history of CAD, HTN, HLD, anxiety, Barrett's esophagus who is here today for cardiac follow up. I saw her as a new patient 10/14/11 for evaluation of chest pain. She reported tightness and pains in the center of her chest with exertion, dyspnea and fatigue. She had been seen by Dr. Marchelle Gearing prior her first visit her and her CT chest showed calcifications in the left main and all three major coronary arteries. I arranged a cardiac cath on 10/16/11. She was found to have a moderate 70% stenosis in the first obtuse marginal branch and a 90% stenosis in the mid LAD. FFR of the obtuse marginal stenosis was 0.98 and following infusion of adenosine was 0.89 suggesting this stenosis was not flow limiting. I could not navigate the flow wire into the LAD. This stenosis appeared critical. I then placed overlapping stents in the mid LAD as this was felt to be the culprit lesion. (2.5 x 24 mm Promus Element with more proximal overlapping 2.75 x 8 mm Promus Element DES). She did well following the procedure. She had recurrence of chest pain and was seen by Norma Fredrickson, NP on 02/25/12 and had c/o worsened dypsnea. I arranged a repeat cath on 03/07/12 and a 2.75 x 12 mm Promus Premier DES was placed in the OM branch.   She is here today for follow up. No chest pain, chest tightness but she is very fatigued. She also describes dyspnea. No near syncope or syncope. Last office visit in September 2014 with same complaints. TSH and CBC ok at that time. Potassium was low so Kdur increased.   Primary Care Physician: Carlota Raspberry   Last Lipid Profile: Followed in primary care.   Past Medical History  Diagnosis Date  . Hyperlipidemia   . HTN (hypertension)   . Anxiety   . Diverticulosis   . Barrett's esophagus without dysplasia   . Rectocele   . CAD (coronary artery disease)     a. LHC 10/16/11: dLM 20%, mLAD 90%, pOM1 30%, mOM1 70% (FFR not hemodynamically significant), prox  and mid RCA 30%, EF 65%;  b. PCI 10/16/11: Promus DES x 2 to mLAD  . COPD (chronic obstructive pulmonary disease)     mild  . Shortness of breath     before caediac stent  . Depression   . GERD (gastroesophageal reflux disease)   . Exertional angina 03/08/2012    Past Surgical History  Procedure Laterality Date  . Partial hysterectomy  1980s  . Colonoscopy  01/2008    multiple diverticula in desc and sigm colon  . Esophagogastroduodenoscopy  01/2008    Barrett's esophagus, gastritis, no h.pylori, due follow-up 01/2011  . Flexible sigmoidoscopy  08/11/2010 RECTAL PAIN/PRESSURE    SML IH, TICS  . Esophagogastroduodenoscopy  04/06/2011    Procedure: ESOPHAGOGASTRODUODENOSCOPY (EGD);  Surgeon: West Bali, MD;  Location: [facility];  Service: Endoscopy;  Laterality: N/A;  8:30  . Abdominal hysterectomy    . Cardiac catheterization      Current Outpatient Prescriptions  Medication Sig Dispense Refill  . ALPRAZolam (XANAX) 0.25 MG tablet Take 0.25 mg by mouth daily as needed. For anxiety      . aspirin 81 MG chewable tablet Chew 1 tablet (81 mg total) by mouth daily.      Marland Kitchen atorvastatin (LIPITOR) 10 MG tablet Take 10 mg by mouth daily.       . Biotin 1000 MCG tablet Take 1,000 mcg by mouth  daily.       . Calcium Carbonate-Vitamin D (CALCIUM + D) 600-200 MG-UNIT TABS Take 1 tablet by mouth 2 (two) times daily.       . Cholecalciferol (VITAMIN D3) 5000 UNITS CAPS Take 1 capsule by mouth daily.      . clopidogrel (PLAVIX) 75 MG tablet Take 1 tablet (75 mg total) by mouth daily with breakfast.  90 tablet  3  . lisinopril-hydrochlorothiazide (PRINZIDE,ZESTORETIC) 20-12.5 MG per tablet Take 1 tablet by mouth at bedtime.      . niacin 500 MG tablet Take 500 mg by mouth daily.      Marland Kitchen omega-3 acid ethyl esters (LOVAZA) 1 G capsule Take 1 g by mouth daily.   60 capsule  3  . pantoprazole (PROTONIX) 40 MG tablet Take 1 tablet (40 mg total) by mouth daily.  90 tablet  3  . potassium chloride SA  (K-DUR,KLOR-CON) 20 MEQ tablet Take 2 tablets (40 mEq total) by mouth daily.  180 tablet  2  . venlafaxine XR (EFFEXOR-XR) 37.5 MG 24 hr capsule Take 37.5 mg by mouth at bedtime.      . vitamin B-12 (CYANOCOBALAMIN) 1000 MCG tablet Take 2,000 mcg by mouth daily.      . nitroGLYCERIN (NITROSTAT) 0.4 MG SL tablet Place 1 tablet (0.4 mg total) under the tongue every 5 (five) minutes as needed for chest pain.  25 tablet  11  . [DISCONTINUED] niacin (NIASPAN) 500 MG CR tablet Take 500 mg by mouth at bedtime.         No current facility-administered medications for this visit.    Allergies  Allergen Reactions  . Adhesive [Tape] Other (See Comments)    Turns skin red  . Sulfa Antibiotics Other (See Comments)    Childhood allergy    History   Social History  . Marital Status: Married    Spouse Name: N/A    Number of Children: 1  . Years of Education: N/A   Occupational History  . Retired Personal assistant     retired  .     Social History Main Topics  . Smoking status: Former Smoker -- 1.00 packs/day for 53 years    Types: Cigarettes    Quit date: 03/20/2007  . Smokeless tobacco: Never Used  . Alcohol Use: No  . Drug Use: No  . Sexual Activity: Yes   Other Topics Concern  . Not on file   Social History Narrative  . No narrative on file    Family History  Problem Relation Age of Onset  . Coronary artery disease Father 31  . Colon cancer Neg Hx   . GI problems Mother     perforated colon   . Heart attack Father 73  . Coronary artery disease Paternal Aunt   . Coronary artery disease Paternal Uncle     Review of Systems:  As stated in the HPI and otherwise negative.   BP 138/72  Pulse 54  Ht 5\' 1"  (1.549 m)  Wt 132 lb 6.4 oz (60.056 kg)  BMI 25.03 kg/m2  Physical Examination: General: Well developed, well nourished, NAD HEENT: OP clear, mucus membranes moist SKIN: warm, dry. No rashes. Neuro: No focal deficits Musculoskeletal: Muscle strength 5/5 all  ext Psychiatric: Mood and affect normal Neck: No JVD, no carotid bruits, no thyromegaly, no lymphadenopathy. Lungs:Clear bilaterally, no wheezes, rhonci, crackles Cardiovascular: Regular rate and rhythm. No murmurs, gallops or rubs. Abdomen:Soft. Bowel sounds present. Non-tender.  Extremities: No lower extremity edema. Pulses  are 2 + in the bilateral DP/PT.  Cardiac Cath 03/07/12:  Left main: The distal vessel has 20-30% stenosis. This was closely examined in multiple views and did not appear to significant.  Left Anterior Descending Artery:Moderate sized vessel that courses to the apex. The proximal vessel has diffuse plaque. There are patent overlapping stents in the mid segment without restenosis. There is calcification in the proximal and mid vessel. The diagonal branch is very small.  Circumflex Artery: Moderate sized vessel with moderate sized obtuse marginal branch. The first obtuse marginal branch has 30% proximal stenosis and 90% mid stenosis. The AV groove Circumflex is small in caliber beyond the takeoff of the marginal branch.  Right Coronary Artery: Moderate sized dominant vessel with diffuse 30% stenosis in the proximal and mid vessel. No flow limiting lesions noted.  Left Ventricular Angiogram: LVEF=65%  Impression:  1. Double vessel CAD with patent stents mid LAD and severe stenosis OM1.  2. Class 3 angina  3. Normal LV systolic function  4. Successful PTCA/DES x 1 OM1   Assessment and Plan:   1. CAD: Recent worsened fatigue similar to how she felt before her stents were placed. She s/p overlapping drug eluting stents mid LAD 10/16/11 and OM 03/07/12. She will need continued dual antiplatelet therapy with ASA and Plavix. Continue statin. Lipids followed in primary care. BP well controlled. Will arrange stress test with recent fatigue to exclude ischemia.   2. Fatigue: No recent illnesses. No fever, chills. No bleeding. TSH, CBC, BMET ok September 2014 except for slightly low  potassium. See above. Will plan stress test to exclude ischemia.   3. Hypokalemia: She is on replacement therapy.

## 2012-12-14 NOTE — Interval H&P Note (Signed)
History and Physical Interval Note:  12/14/2012 10:30 AM  Rhonda Hughes  has presented today for cardiac cath with the diagnosis of CAD, dyspnea, fatigue.  The various methods of treatment have been discussed with the patient and family. After consideration of risks, benefits and other options for treatment, the patient has consented to  Procedure(s): LEFT HEART CATHETERIZATION WITH CORONARY ANGIOGRAM (N/A) as a surgical intervention .  The patient's history has been reviewed, patient examined, no change in status, stable for surgery.  I have reviewed the patient's chart and labs.  Questions were answered to the patient's satisfaction.    Cath Lab Visit (complete for each Cath Lab visit)  Clinical Evaluation Leading to the Procedure:   ACS: no  Non-ACS:    Anginal Classification: CCS III  Anti-ischemic medical therapy: Minimal Therapy (1 class of medications)  Non-Invasive Test Results: Low-risk stress test findings: cardiac mortality <1%/year  Prior CABG: No previous CABG        Miking Usrey

## 2012-12-14 NOTE — CV Procedure (Signed)
Cardiac Catheterization Operative Report  Rhonda Hughes [id number] 11/26/201411:38 AM DAVIDSON,ERIC, MD  Procedure Performed:  1. Left Heart Catheterization 2. Selective Coronary Angiography 3. IVUS of the LAD  Operator: Verne Carrow, MD  Arterial access site:  Right radial artery.   Indication: 72 yo female with history of CAD, HTN, HLD, anxiety, Barrett's esophagus who is here today for cardiac cath. I saw her as a new patient 10/14/11 for evaluation of chest pain. She reported tightness and pains in the center of her chest with exertion, dyspnea and fatigue. She had been seen by Dr. Marchelle Gearing prior her first visit her and her CT chest showed calcifications in the left main and all three major coronary arteries. I arranged a cardiac cath on 10/16/11. She was found to have a moderate 70% stenosis in the first obtuse marginal branch and a 90% stenosis in the mid LAD. FFR of the obtuse marginal stenosis was 0.98 and following infusion of adenosine was 0.89 suggesting this stenosis was not flow limiting. I then placed overlapping stents in the mid LAD as this was felt to be the culprit lesion. (2.5 x 24 mm Promus Element with more proximal overlapping 2.75 x 8 mm Promus Element DES). She did well following the procedure. She had recurrence of chest pain and was seen by Norma Fredrickson, NP on 02/25/12 and had c/o worsened dypsnea. I arranged a repeat cath on 03/07/12 and a 2.75 x 12 mm Promus Premier DES was placed in the OM branch. She has done well for the last 6 months but over the last month has had fatigue, dyspnea. Stress myoview 12/08/12 with no ischemia but she did have 1 mm ST segment depression with exercise. Based on her symptoms and known disease, repeat cath today planned.                                       Procedure Details: The risks, benefits, complications, treatment options, and expected outcomes were discussed with the patient. The patient and/or family concurred  with the proposed plan, giving informed consent. The patient was brought to the cath lab after IV hydration was begun and oral premedication was given. The patient was further sedated with Versed and Fentanyl. The right wrist was assessed with an Allens test which was positive. The right wrist was prepped and draped in a sterile fashion. 1% lidocaine was used for local anesthesia. Using the modified Seldinger access technique, a 5/6 French sheath was placed in the right radial artery. 3 mg Verapamil was given through the sheath. 3500 units IV heparin was given. Standard diagnostic catheters were used to perform selective coronary angiography. A pigtail catheter was used to measure LV pressures. Her distal left main and ostial LAD had plaque disease which appeared to be moderate angiographically. I elected to perform IVUS of the LAD, left main artery.   IVUS Data: The left main was engaged with a XB LAD 3.5 guiding catheter. ACT was 196. She was given an additional 2500 units IV heparin. I then passed a Whisper wire down the LAD. IVUS was performed with automated pullback. The proximal LAD just before the old mid stent had a moderate degree of plaque burden with minimal lumen area of 4.0 mm2. By angiography, this area appeared to have mild stenosis. The ostial LAD had mild plaque burden with lumen area of 6.0-6.5 mm2. The left main distally  has mild to moderate plaque burden with lumen area of 9.0-10.0 mm2. The disease in the left main and LAD does not appear to be flow limiting. I did not perform a flow wire analysis at this time as I was unable to navigate a flow wire into the LAD last year due to the acute takeoff from the left main. The guide and wires were removed.   The sheath was removed from the right radial artery and a Terumo hemostasis band was applied at the arteriotomy site on the right wrist. There were no immediate complications. The patient was taken to the recovery area in stable condition.    Hemodynamic Findings: Central aortic pressure: 128/60 Left ventricular pressure: 138/7/11  Angiographic Findings:  Left main: The distal vessel has 30% stenosis. This was closely examined in multiple views and did not appear to significant.   Left Anterior Descending Artery: Moderate sized vessel that courses to the apex. The ostium has a 30-40% stenosis. The proximal vessel has diffuse plaque. There are patent overlapping stents in the mid segment with 30% restenosis in the distal stented segmetn.  There is calcification in the proximal and mid vessel. The diagonal branch is very small.   Circumflex Artery: Moderate sized vessel with moderate sized obtuse marginal branch. The first obtuse marginal branch has 30% proximal stenosis and a patent stent in the mid segment with no restenosis. The AV groove Circumflex is small in caliber beyond the takeoff of the marginal branch.   Right Coronary Artery: Moderate sized dominant vessel with diffuse 30% stenosis in the proximal and mid vessel. No flow limiting lesions noted.   Left Ventricular Angiogram: Deferred.   Impression: 1. Double vessel CAD with patent stents in the LAD and Circumflex 2. Mild to moderate distal left main and ostial LAD disease that is evaluated with IVUS and is not severe 3. Moderate proximal LAD disease, moderate by IVUS 4. No flow limiting lesions noted  Recommendations: Will continue medical management. Will start Imdur 30 mg po Qdaily.        Complications:  None. The patient tolerated the procedure well.

## 2012-12-20 ENCOUNTER — Encounter: Payer: Self-pay | Admitting: Cardiovascular Disease

## 2012-12-29 ENCOUNTER — Encounter: Payer: Self-pay | Admitting: Cardiovascular Disease

## 2012-12-29 ENCOUNTER — Ambulatory Visit (INDEPENDENT_AMBULATORY_CARE_PROVIDER_SITE_OTHER): Payer: Medicare Other | Admitting: Cardiovascular Disease

## 2012-12-29 VITALS — BP 137/53 | HR 65 | Ht 61.0 in | Wt 132.0 lb

## 2012-12-29 DIAGNOSIS — E785 Hyperlipidemia, unspecified: Secondary | ICD-10-CM

## 2012-12-29 DIAGNOSIS — I2510 Atherosclerotic heart disease of native coronary artery without angina pectoris: Secondary | ICD-10-CM

## 2012-12-29 DIAGNOSIS — I10 Essential (primary) hypertension: Secondary | ICD-10-CM

## 2012-12-29 MED ORDER — ISOSORBIDE MONONITRATE ER 30 MG PO TB24: 30.0000 mg | ORAL_TABLET | Freq: Every day | ORAL | Status: DC

## 2012-12-29 NOTE — Progress Notes (Signed)
History of Present Illness: 72 yo female with history of CAD, HTN, HLD, anxiety, Barrett's esophagus who is here today for cardiac follow up. I saw her as a new patient 10/14/11 for evaluation of chest pain. She reported tightness and pains in the center of her chest with exertion, dyspnea and fatigue. She had been seen by Dr. Marchelle Gearing prior her first visit her and her CT chest showed calcifications in the left main and all three major coronary arteries. I arranged a cardiac cath on 10/16/11. She was found to have a moderate 70% stenosis in the first obtuse marginal branch and a 90% stenosis in the mid LAD. FFR of the obtuse marginal stenosis was 0.98 and following infusion of adenosine was 0.89 suggesting this stenosis was not flow limiting. I could not navigate the flow wire into the LAD. This stenosis appeared critical. I then placed overlapping stents in the mid LAD as this was felt to be the culprit lesion. (2.5 x 24 mm Promus Element with more proximal overlapping 2.75 x 8 mm Promus Element DES). She did well following the procedure. She had recurrence of chest pain and was seen by Norma Fredrickson, NP on 02/25/12 and had c/o worsened dypsnea. I arranged a repeat cath on 03/07/12 and a 2.75 x 12 mm Promus Premier DES was placed in the OM branch. I saw her on 11/121/4 and she had c/o fatigue and SOB. Stress myoview without ischemia on images but she did have EKG changes suggestive of ischemia during exercise. Cardiac cath 12/14/12 with patent stents, moderate disease. IVUS of left main and proximal LAD confirmed mild plaque disease.   She is here today for follow up. No chest pain, SOB or fatigue. No LE edema.   Primary Care Physician: Carlota Raspberry   Last Lipid Profile: Followed in primary care.   Past Medical History  Diagnosis Date  . Hyperlipidemia   . HTN (hypertension)   . Anxiety   . Diverticulosis   . Barrett's esophagus without dysplasia   . Rectocele   . CAD (coronary artery disease)      a. LHC 10/16/11: dLM 20%, mLAD 90%, pOM1 30%, mOM1 70% (FFR not hemodynamically significant), prox and mid RCA 30%, EF 65%;  b. PCI 10/16/11: Promus DES x 2 to mLAD  . COPD (chronic obstructive pulmonary disease)     mild  . Shortness of breath     before caediac stent  . Depression   . GERD (gastroesophageal reflux disease)   . Exertional angina 03/08/2012    Past Surgical History  Procedure Laterality Date  . Partial hysterectomy  1980s  . Colonoscopy  01/2008    multiple diverticula in desc and sigm colon  . Esophagogastroduodenoscopy  01/2008    Barrett's esophagus, gastritis, no h.pylori, due follow-up 01/2011  . Flexible sigmoidoscopy  08/11/2010 RECTAL PAIN/PRESSURE    SML IH, TICS  . Esophagogastroduodenoscopy  04/06/2011    Procedure: ESOPHAGOGASTRODUODENOSCOPY (EGD);  Surgeon: West Bali, MD;  Location: [facility];  Service: Endoscopy;  Laterality: N/A;  8:30  . Abdominal hysterectomy    . Cardiac catheterization      Current Outpatient Prescriptions  Medication Sig Dispense Refill  . ALPRAZolam (XANAX) 0.25 MG tablet Take 0.25 mg by mouth daily as needed. For anxiety      . aspirin 81 MG chewable tablet Chew 81 mg by mouth daily.      Marland Kitchen atorvastatin (LIPITOR) 10 MG tablet Take 10 mg by mouth daily.       Marland Kitchen  Calcium Carbonate-Vitamin D (CALCIUM + D) 600-200 MG-UNIT TABS Take 1 tablet by mouth 2 (two) times daily.       . Cholecalciferol (VITAMIN D3) 5000 UNITS CAPS Take 1 capsule by mouth daily.      . clopidogrel (PLAVIX) 75 MG tablet Take 75 mg by mouth daily with breakfast.      . isosorbide mononitrate (IMDUR) 30 MG 24 hr tablet Take 1 tablet (30 mg total) by mouth daily.  30 tablet  6  . lisinopril-hydrochlorothiazide (PRINZIDE,ZESTORETIC) 20-12.5 MG per tablet Take 1 tablet by mouth at bedtime.      . nitroGLYCERIN (NITROSTAT) 0.4 MG SL tablet Place 0.4 mg under the tongue every 5 (five) minutes as needed for chest pain.      Marland Kitchen omega-3 acid ethyl esters (LOVAZA) 1 G  capsule Take 1 g by mouth daily.   60 capsule  3  . pantoprazole (PROTONIX) 40 MG tablet Take 40 mg by mouth daily.      . potassium chloride SA (K-DUR,KLOR-CON) 20 MEQ tablet Take 40 mEq by mouth every morning.      . venlafaxine XR (EFFEXOR-XR) 37.5 MG 24 hr capsule Take 37.5 mg by mouth at bedtime.      . vitamin B-12 (CYANOCOBALAMIN) 1000 MCG tablet Take 2,000 mcg by mouth daily.      . [DISCONTINUED] niacin (NIASPAN) 500 MG CR tablet Take 500 mg by mouth at bedtime.         No current facility-administered medications for this visit.    Allergies  Allergen Reactions  . Adhesive [Tape] Other (See Comments)    Turns skin red  . Sulfa Antibiotics Other (See Comments)    Childhood allergy    History   Social History  . Marital Status: Married    Spouse Name: N/A    Number of Children: 1  . Years of Education: N/A   Occupational History  . Retired Personal assistant     retired  .     Social History Main Topics  . Smoking status: Former Smoker -- 1.00 packs/day for 53 years    Types: Cigarettes    Quit date: 03/20/2007  . Smokeless tobacco: Never Used  . Alcohol Use: No  . Drug Use: No  . Sexual Activity: Yes   Other Topics Concern  . Not on file   Social History Narrative  . No narrative on file    Family History  Problem Relation Age of Onset  . Coronary artery disease Father 27  . Colon cancer Neg Hx   . GI problems Mother     perforated colon   . Heart attack Father 74  . Coronary artery disease Paternal Aunt   . Coronary artery disease Paternal Uncle     Review of Systems:  As stated in the HPI and otherwise negative.   BP 137/53  Pulse 65  Ht 5\' 1"  (1.549 m)  Wt 132 lb (59.875 kg)  BMI 24.95 kg/m2  Physical Examination: General: Well developed, well nourished, NAD HEENT: OP clear, mucus membranes moist SKIN: warm, dry. No rashes. Neuro: No focal deficits Musculoskeletal: Muscle strength 5/5 all ext Psychiatric: Mood and affect  normal Neck: No JVD, no carotid bruits, no thyromegaly, no lymphadenopathy. Lungs:Clear bilaterally, no wheezes, rhonci, crackles Cardiovascular: Regular rate and rhythm. No murmurs, gallops or rubs. Abdomen:Soft. Bowel sounds present. Non-tender.  Extremities: No lower extremity edema. Pulses are 2 + in the bilateral DP/PT.  Cardiac Cath 12/14/12: Left main: The distal vessel has  30% stenosis. This was closely examined in multiple views and did not appear to significant.  Left Anterior Descending Artery: Moderate sized vessel that courses to the apex. The ostium has a 30-40% stenosis. The proximal vessel has diffuse plaque. There are patent overlapping stents in the mid segment with 30% restenosis in the distal stented segmetn. There is calcification in the proximal and mid vessel. The diagonal branch is very small.  Circumflex Artery: Moderate sized vessel with moderate sized obtuse marginal branch. The first obtuse marginal branch has 30% proximal stenosis and a patent stent in the mid segment with no restenosis. The AV groove Circumflex is small in caliber beyond the takeoff of the marginal branch.  Right Coronary Artery: Moderate sized dominant vessel with diffuse 30% stenosis in the proximal and mid vessel. No flow limiting lesions noted.  Left Ventricular Angiogram: Deferred.  Impression:  1. Double vessel CAD with patent stents in the LAD and Circumflex  2. Mild to moderate distal left main and ostial LAD disease that is evaluated with IVUS and is not severe  3. Moderate proximal LAD disease, moderate by IVUS  4. No flow limiting lesions noted   Assessment and Plan:   1. CAD: Stable by recent cath and IVUS of Left main, LAD.  She will need continued dual antiplatelet therapy with ASA and Plavix. Imdur added and she is tolerating well. Continue statin. Lipids followed in primary care. BP well controlled.    2. HTN: BP well controlled. No changes.   3. HLD: Continue statin. Lipids  followed in primary care.

## 2012-12-29 NOTE — Patient Instructions (Signed)
Your physician wants you to follow-up in:  6 months. You will receive a reminder letter in the mail two months in advance. If you don't receive a letter, please call our office to schedule the follow-up appointment.   

## 2013-02-17 ENCOUNTER — Encounter: Payer: Self-pay | Admitting: Cardiovascular Disease

## 2013-02-17 ENCOUNTER — Telehealth: Payer: Self-pay | Admitting: *Deleted

## 2013-02-17 NOTE — Telephone Encounter (Signed)
I spoke with Malachy Mood and gave her information from Dr. Angelena Form that pt  may come off of Plavix for 7 days before procedure. Will fax this note to [place] at [phone].      From [name], MD [[id number]]    To [facility] [place]    Composed 02/17/2013  2:49 PM    For Delivery On 02/17/2013  2:49 PM    Subject RE: Non-Urgent Medical Question    Message Type Patient Medical Advice Request    Read Status N    By Liana Gerold has not read the message    Message Body Ms. Wempe, We have not gotten a request from [facility] but you can come off of Plavix for 7 days before your procedure. Fraser Din will call that number today to see what we need to do to document this. Gerald Stabs         ----- Message -----      From: Liana Gerold      Sent: 02/17/2013 10:46 AM EST        To: Lauree Chandler, MD   Subject: Non-Urgent Medical Question      Has [facility] gotten in touch w/You? I am to have a Steroid injection in the back/spine area next Fri. and I need to come off the Plavix for 7 days.This is to try and avoid surgery! My left foot and leg goes numb and then I have a lot of pain in lower left back. You can call Me if You need more info!!The Person that is suppose to call You is Malachy Mood and Her no. is [phone]

## 2013-03-28 ENCOUNTER — Ambulatory Visit: Payer: Medicare Other | Admitting: Cardiovascular Disease

## 2013-04-03 ENCOUNTER — Other Ambulatory Visit: Payer: Self-pay | Admitting: *Deleted

## 2013-04-03 MED ORDER — PANTOPRAZOLE SODIUM 40 MG PO TBEC: 40.0000 mg | DELAYED_RELEASE_TABLET | Freq: Every day | ORAL | Status: DC

## 2013-04-13 ENCOUNTER — Encounter: Payer: Self-pay | Admitting: Cardiovascular Disease

## 2013-06-09 ENCOUNTER — Encounter: Payer: Self-pay | Admitting: Cardiovascular Disease

## 2013-06-15 ENCOUNTER — Other Ambulatory Visit: Payer: Self-pay | Admitting: Cardiovascular Disease

## 2013-07-03 ENCOUNTER — Ambulatory Visit: Payer: Medicare Other | Admitting: Cardiovascular Disease

## 2013-07-19 ENCOUNTER — Ambulatory Visit (INDEPENDENT_AMBULATORY_CARE_PROVIDER_SITE_OTHER): Payer: Medicare Other | Admitting: Cardiovascular Disease

## 2013-07-19 ENCOUNTER — Encounter: Payer: Self-pay | Admitting: Cardiovascular Disease

## 2013-07-19 VITALS — BP 98/72 | HR 60 | Ht 61.0 in | Wt 130.0 lb

## 2013-07-19 DIAGNOSIS — I2510 Atherosclerotic heart disease of native coronary artery without angina pectoris: Secondary | ICD-10-CM

## 2013-07-19 DIAGNOSIS — I10 Essential (primary) hypertension: Secondary | ICD-10-CM

## 2013-07-19 DIAGNOSIS — E785 Hyperlipidemia, unspecified: Secondary | ICD-10-CM

## 2013-07-19 MED ORDER — ISOSORBIDE MONONITRATE ER 30 MG PO TB24: 30.0000 mg | ORAL_TABLET | Freq: Every day | ORAL | Status: DC

## 2013-07-19 MED ORDER — PANTOPRAZOLE SODIUM 40 MG PO TBEC: DELAYED_RELEASE_TABLET | ORAL | Status: DC

## 2013-07-19 MED ORDER — CLOPIDOGREL BISULFATE 75 MG PO TABS: ORAL_TABLET | ORAL | Status: DC

## 2013-07-19 NOTE — Progress Notes (Signed)
History of Present Illness: 73 yo female with history of CAD, HTN, HLD, anxiety, Barrett's esophagus who is here today for cardiac follow up. I saw her as a new patient 10/14/11 for evaluation of chest pain. She had been seen by Dr. Chase Caller prior her first visit her and her CT chest showed calcifications in the left main and all three major coronary arteries. I arranged a cardiac cath on 10/16/11. She was found to have a moderate 70% stenosis in the first obtuse marginal branch and a 90% stenosis in the mid LAD. FFR of the obtuse marginal stenosis was 0.98 and following infusion of adenosine was 0.89 suggesting this stenosis was not flow limiting. I could not navigate the flow wire into the LAD. This stenosis appeared critical. I then placed overlapping stents in the mid LAD as this was felt to be the culprit lesion. (2.5 x 24 mm Promus Element with more proximal overlapping 2.75 x 8 mm Promus Element DES). She did well following the procedure. She had recurrence of chest pain and was seen by Truitt Merle, NP on 02/25/12 and had c/o worsened dypsnea. I arranged a repeat cath on 03/07/12 and a 2.75 x 12 mm Promus Premier DES was placed in the OM branch. I saw her on 11/121/4 and she had c/o fatigue and SOB. Stress myoview without ischemia on images but she did have EKG changes suggestive of ischemia during exercise. Cardiac cath 12/14/12 with patent stents, moderate disease. IVUS of left main and proximal LAD confirmed mild plaque disease.   She is here today for follow up. No chest pain, SOB or fatigue. No LE edema. She has retired.   Primary Care Physician: Netta Cedars   Last Lipid Profile: Followed in primary care.   Past Medical History  Diagnosis Date  . Hyperlipidemia   . HTN (hypertension)   . Anxiety   . Diverticulosis   . Barrett's esophagus without dysplasia   . Rectocele   . CAD (coronary artery disease)     a. LHC 10/16/11: dLM 20%, mLAD 90%, pOM1 30%, mOM1 70% (FFR not  hemodynamically significant), prox and mid RCA 30%, EF 65%;  b. PCI 10/16/11: Promus DES x 2 to mLAD  . COPD (chronic obstructive pulmonary disease)     mild  . Shortness of breath     before caediac stent  . Depression   . GERD (gastroesophageal reflux disease)   . Exertional angina 03/08/2012    Past Surgical History  Procedure Laterality Date  . Partial hysterectomy  1980s  . Colonoscopy  01/2008    multiple diverticula in desc and sigm colon  . Esophagogastroduodenoscopy  01/2008    Barrett's esophagus, gastritis, no h.pylori, due follow-up 01/2011  . Flexible sigmoidoscopy  08/11/2010 RECTAL PAIN/PRESSURE    SML IH, TICS  . Esophagogastroduodenoscopy  04/06/2011    Procedure: ESOPHAGOGASTRODUODENOSCOPY (EGD);  Surgeon: Danie Binder, MD;  Location: [facility];  Service: Endoscopy;  Laterality: N/A;  8:30  . Abdominal hysterectomy    . Cardiac catheterization      Current Outpatient Prescriptions  Medication Sig Dispense Refill  . ALPRAZolam (XANAX) 0.25 MG tablet Take 0.25 mg by mouth daily as needed. For anxiety      . aspirin 81 MG chewable tablet Chew 81 mg by mouth daily.      Marland Kitchen atorvastatin (LIPITOR) 10 MG tablet Take 10 mg by mouth daily.       . Biotin 5000 MCG CAPS Take by mouth 2 (  two) times daily.      . Calcium Carbonate-Vitamin D (CALCIUM + D) 600-200 MG-UNIT TABS Take 1 tablet by mouth 2 (two) times daily.       . Cholecalciferol (VITAMIN D-3) 1000 UNITS CAPS Take by mouth.      . clopidogrel (PLAVIX) 75 MG tablet Take 1 tablet (75 mg total) by mouth daily with  breakfast.  90 tablet  1  . isosorbide mononitrate (IMDUR) 30 MG 24 hr tablet Take 1 tablet (30 mg total) by mouth daily.  90 tablet  3  . lisinopril-hydrochlorothiazide (PRINZIDE,ZESTORETIC) 20-12.5 MG per tablet Take 1 tablet by mouth at bedtime.      . niacin 500 MG tablet Take 500 mg by mouth at bedtime.      . nitroGLYCERIN (NITROSTAT) 0.4 MG SL tablet Place 0.4 mg under the tongue every 5 (five)  minutes as needed for chest pain.      . Omega-3 Fatty Acids (FISH OIL) 1000 MG CAPS Take by mouth.      . pantoprazole (PROTONIX) 40 MG tablet Take 1 tablet by mouth  daily  90 tablet  1  . potassium chloride SA (K-DUR,KLOR-CON) 20 MEQ tablet Take 40 mEq by mouth every morning.      . venlafaxine XR (EFFEXOR-XR) 37.5 MG 24 hr capsule Take 37.5 mg by mouth at bedtime.      . vitamin B-12 (CYANOCOBALAMIN) 1000 MCG tablet Take 2,000 mcg by mouth daily.      . [DISCONTINUED] niacin (NIASPAN) 500 MG CR tablet Take 500 mg by mouth at bedtime.         No current facility-administered medications for this visit.    Allergies  Allergen Reactions  . Adhesive [Tape] Other (See Comments)    Turns skin red  . Sulfa Antibiotics Other (See Comments)    Childhood allergy    History   Social History  . Marital Status: Married    Spouse Name: N/A    Number of Children: 1  . Years of Education: N/A   Occupational History  . 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     Social History Main Topics  . Smoking status: Former Smoker -- 1.00 packs/day for 53 years    Types: Cigarettes    Quit date: 03/20/2007  . Smokeless tobacco: Never Used  . Alcohol Use: No  . Drug Use: No  . Sexual Activity: Yes   Other Topics Concern  . Not on file   Social History Narrative  . No narrative on file    Family History  Problem Relation Age of Onset  . Coronary artery disease Father 50  . Colon cancer Neg Hx   . GI problems Mother     perforated colon   . Heart attack Father 53  . Coronary artery disease Paternal Aunt   . Coronary artery disease Paternal Uncle     Review of Systems:  As stated in the HPI and otherwise negative.   BP 98/72  Pulse 60  Ht 5\' 1"  (1.549 m)  Wt 130 lb (58.968 kg)  BMI 24.58 kg/m2  Physical Examination: General: Well developed, well nourished, NAD HEENT: OP clear, mucus membranes moist SKIN: warm, dry. No rashes. Neuro: No focal  deficits Musculoskeletal: Muscle strength 5/5 all ext Psychiatric: Mood and affect normal Neck: No JVD, no carotid bruits, no thyromegaly, no lymphadenopathy. Lungs:Clear bilaterally, no wheezes, rhonci, crackles Cardiovascular: Regular rate and rhythm. No murmurs, gallops or rubs. Abdomen:Soft. Bowel sounds present.  Non-tender.  Extremities: No lower extremity edema. Pulses are 2 + in the bilateral DP/PT.  Cardiac Cath 12/14/12: Left main: The distal vessel has 30% stenosis. This was closely examined in multiple views and did not appear to significant.  Left Anterior Descending Artery: Moderate sized vessel that courses to the apex. The ostium has a 30-40% stenosis. The proximal vessel has diffuse plaque. There are patent overlapping stents in the mid segment with 30% restenosis in the distal stented segmetn. There is calcification in the proximal and mid vessel. The diagonal branch is very small.  Circumflex Artery: Moderate sized vessel with moderate sized obtuse marginal branch. The first obtuse marginal branch has 30% proximal stenosis and a patent stent in the mid segment with no restenosis. The AV groove Circumflex is small in caliber beyond the takeoff of the marginal branch.  Right Coronary Artery: Moderate sized dominant vessel with diffuse 30% stenosis in the proximal and mid vessel. No flow limiting lesions noted.  Left Ventricular Angiogram: Deferred.  Impression:  1. Double vessel CAD with patent stents in the LAD and Circumflex  2. Mild to moderate distal left main and ostial LAD disease that is evaluated with IVUS and is not severe  3. Moderate proximal LAD disease, moderate by IVUS  4. No flow limiting lesions noted  EKG: NSR, rate 60 bpm.   Assessment and Plan:   1. CAD: Stable. Continue current meds including ASA/Plavix. No beta blocker secondary to bradycardia.   2. HTN: BP well controlled. No changes.   3. HLD: Continue statin. Lipids followed in primary care.

## 2013-07-19 NOTE — Patient Instructions (Signed)
Your physician wants you to follow-up in:  6 months. You will receive a reminder letter in the mail two months in advance. If you don't receive a letter, please call our office to schedule the follow-up appointment.   

## 2013-12-28 ENCOUNTER — Encounter (HOSPITAL_COMMUNITY): Payer: Self-pay | Admitting: Cardiovascular Disease

## 2014-01-15 ENCOUNTER — Encounter: Payer: Self-pay | Admitting: Cardiovascular Disease

## 2014-01-15 ENCOUNTER — Ambulatory Visit (INDEPENDENT_AMBULATORY_CARE_PROVIDER_SITE_OTHER): Payer: Medicare Other | Admitting: Cardiovascular Disease

## 2014-01-15 VITALS — BP 122/68 | HR 80 | Ht 61.5 in | Wt 131.4 lb

## 2014-01-15 DIAGNOSIS — I2510 Atherosclerotic heart disease of native coronary artery without angina pectoris: Secondary | ICD-10-CM

## 2014-01-15 DIAGNOSIS — E785 Hyperlipidemia, unspecified: Secondary | ICD-10-CM

## 2014-01-15 DIAGNOSIS — I10 Essential (primary) hypertension: Secondary | ICD-10-CM

## 2014-01-15 NOTE — Progress Notes (Signed)
History of Present Illness: 73 yo female with history of CAD, HTN, HLD, anxiety, Barrett's esophagus who is here today for cardiac follow up. I saw her as a new patient 10/14/11 for evaluation of chest pain. She had been seen by Dr. Chase Caller prior her first visit her and her CT chest showed calcifications in the left main and all three major coronary arteries. I arranged a cardiac cath on 10/16/11. She was found to have a moderate 70% stenosis in the first obtuse marginal branch and a 90% stenosis in the mid LAD. FFR of the obtuse marginal stenosis was 0.98 and following infusion of adenosine was 0.89 suggesting this stenosis was not flow limiting. I could not navigate the flow wire into the LAD. This stenosis appeared critical. I then placed overlapping stents in the mid LAD. (2.5 x 24 mm Promus Element with more proximal overlapping 2.75 x 8 mm Promus Element DES). She did well following the procedure. She had recurrence of chest pain and was seen by Truitt Merle, NP on 02/25/12 and had c/o worsened dypsnea. I arranged a repeat cath on 03/07/12 and a 2.75 x 12 mm Promus Premier DES was placed in the OM branch. I saw her on 11/30/12 and she had c/o fatigue and SOB. Stress myoview without ischemia on images but she did have EKG changes suggestive of ischemia during exercise. Cardiac cath 12/14/12 with patent stents, moderate disease. IVUS of left main and proximal LAD confirmed mild plaque disease.   She is here today for follow up. No chest pain, SOB or fatigue. No LE edema. She has retired.   Primary Care Physician: Netta Cedars (retired but no new Dr. Loreli Dollar)  Last Lipid Profile: Followed in primary care.   Past Medical History  Diagnosis Date  . Hyperlipidemia   . HTN (hypertension)   . Anxiety   . Diverticulosis   . Barrett's esophagus without dysplasia   . Rectocele   . CAD (coronary artery disease)     a. LHC 10/16/11: dLM 20%, mLAD 90%, pOM1 30%, mOM1 70% (FFR not hemodynamically  significant), prox and mid RCA 30%, EF 65%;  b. PCI 10/16/11: Promus DES x 2 to mLAD  . COPD (chronic obstructive pulmonary disease)     mild  . Shortness of breath     before caediac stent  . Depression   . GERD (gastroesophageal reflux disease)   . Exertional angina 03/08/2012    Past Surgical History  Procedure Laterality Date  . Partial hysterectomy  1980s  . Colonoscopy  01/2008    multiple diverticula in desc and sigm colon  . Esophagogastroduodenoscopy  01/2008    Barrett's esophagus, gastritis, no h.pylori, due follow-up 01/2011  . Flexible sigmoidoscopy  08/11/2010 RECTAL PAIN/PRESSURE    SML IH, TICS  . Esophagogastroduodenoscopy  04/06/2011    Procedure: ESOPHAGOGASTRODUODENOSCOPY (EGD);  Surgeon: Danie Binder, MD;  Location: [facility];  Service: Endoscopy;  Laterality: N/A;  8:30  . Abdominal hysterectomy    . Cardiac catheterization    . Fractional flow reserve wire N/A 10/16/2011    Procedure: FRACTIONAL FLOW RESERVE WIRE;  Surgeon: [name], MD;  Location: [facility] CATH LAB;  Service: Cardiovascular;  Laterality: N/A;  . Left heart catheterization with coronary angiogram N/A 03/07/2012    Procedure: LEFT HEART CATHETERIZATION WITH CORONARY ANGIOGRAM;  Surgeon: [name], MD;  Location: [facility] CATH LAB;  Service: Cardiovascular;  Laterality: N/A;  . Left heart catheterization with coronary angiogram N/A 12/14/2012  Procedure: LEFT HEART CATHETERIZATION WITH CORONARY ANGIOGRAM;  Surgeon: [name], MD;  Location: [facility] CATH LAB;  Service: Cardiovascular;  Laterality: N/A;    Current Outpatient Prescriptions  Medication Sig Dispense Refill  . ALPRAZolam (XANAX) 0.25 MG tablet Take 0.25 mg by mouth daily as needed. For anxiety    . aspirin 81 MG chewable tablet Chew 81 mg by mouth daily.    Marland Kitchen atorvastatin (LIPITOR) 10 MG tablet Take 10 mg by mouth daily.     . Biotin 5000 MCG CAPS Take by mouth 2 (two) times daily.    . Calcium  Carbonate-Vitamin D (CALCIUM + D) 600-200 MG-UNIT TABS Take 1 tablet by mouth 2 (two) times daily.     . Cholecalciferol (VITAMIN D-3) 1000 UNITS CAPS Take by mouth.    . clopidogrel (PLAVIX) 75 MG tablet Take 1 tablet (75 mg total) by mouth daily with  breakfast. 90 tablet 3  . isosorbide mononitrate (IMDUR) 30 MG 24 hr tablet Take 1 tablet (30 mg total) by mouth daily. 90 tablet 3  . lisinopril-hydrochlorothiazide (PRINZIDE,ZESTORETIC) 20-12.5 MG per tablet Take 1 tablet by mouth at bedtime.    . niacin 500 MG tablet Take 500 mg by mouth at bedtime.    . nitroGLYCERIN (NITROSTAT) 0.4 MG SL tablet Place 0.4 mg under the tongue every 5 (five) minutes as needed for chest pain.    . Omega-3 Fatty Acids (FISH OIL) 1000 MG CAPS Take by mouth.    . pantoprazole (PROTONIX) 40 MG tablet Take 1 tablet by mouth  daily 90 tablet 3  . potassium chloride SA (K-DUR,KLOR-CON) 20 MEQ tablet Take 40 mEq by mouth every morning.    . venlafaxine XR (EFFEXOR-XR) 37.5 MG 24 hr capsule Take 37.5 mg by mouth at bedtime.    . vitamin B-12 (CYANOCOBALAMIN) 1000 MCG tablet Take 2,000 mcg by mouth daily.    . [DISCONTINUED] niacin (NIASPAN) 500 MG CR tablet Take 500 mg by mouth at bedtime.       No current facility-administered medications for this visit.    Allergies  Allergen Reactions  . Adhesive [Tape] Other (See Comments)    Turns skin red  . Sulfa Antibiotics Other (See Comments)    Childhood allergy    History   Social History  . Marital Status: Married    Spouse Name: N/A    Number of Children: 1  . Years of Education: N/A   Occupational History  . 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     Social History Main Topics  . Smoking status: Former Smoker -- 1.00 packs/day for 53 years    Types: Cigarettes    Quit date: 03/20/2007  . Smokeless tobacco: Never Used  . Alcohol Use: No  . Drug Use: No  . Sexual Activity: Yes   Other Topics Concern  . Not on file   Social History  Narrative    Family History  Problem Relation Age of Onset  . Coronary artery disease Father 108  . Colon cancer Neg Hx   . GI problems Mother     perforated colon   . Heart attack Father 21  . Coronary artery disease Paternal Aunt   . Coronary artery disease Paternal Uncle     Review of Systems:  As stated in the HPI and otherwise negative.   BP 122/68 mmHg  Pulse 80  Ht 5' 1.5" (1.562 m)  Wt 131 lb 6.4 oz (59.603 kg)  BMI 24.43 kg/m2  SpO2 97%  Physical Examination: General: Well developed, well nourished, NAD HEENT: OP clear, mucus membranes moist SKIN: warm, dry. No rashes. Neuro: No focal deficits Musculoskeletal: Muscle strength 5/5 all ext Psychiatric: Mood and affect normal Neck: No JVD, no carotid bruits, no thyromegaly, no lymphadenopathy. Lungs:Clear bilaterally, no wheezes, rhonci, crackles Cardiovascular: Regular rate and rhythm. No murmurs, gallops or rubs. Abdomen:Soft. Bowel sounds present. Non-tender.  Extremities: No lower extremity edema. Pulses are 2 + in the bilateral DP/PT.  Cardiac Cath 12/14/12: Left main: The distal vessel has 30% stenosis. This was closely examined in multiple views and did not appear to significant.  Left Anterior Descending Artery: Moderate sized vessel that courses to the apex. The ostium has a 30-40% stenosis. The proximal vessel has diffuse plaque. There are patent overlapping stents in the mid segment with 30% restenosis in the distal stented segmetn. There is calcification in the proximal and mid vessel. The diagonal branch is very small.  Circumflex Artery: Moderate sized vessel with moderate sized obtuse marginal branch. The first obtuse marginal branch has 30% proximal stenosis and a patent stent in the mid segment with no restenosis. The AV groove Circumflex is small in caliber beyond the takeoff of the marginal branch.  Right Coronary Artery: Moderate sized dominant vessel with diffuse 30% stenosis in the proximal and mid  vessel. No flow limiting lesions noted.  Left Ventricular Angiogram: Deferred.  Impression:  1. Double vessel CAD with patent stents in the LAD and Circumflex  2. Mild to moderate distal left main and ostial LAD disease that is evaluated with IVUS and is not severe  3. Moderate proximal LAD disease, moderate by IVUS  4. No flow limiting lesions noted  Assessment and Plan:   1. CAD: Stable. Continue current meds including ASA/Plavix. No beta blocker secondary to bradycardia. Will plan stress test in December of 2016.   2. HTN: BP well controlled. No changes.   3. HLD: Continue statin. Lipids followed in primary care.

## 2014-01-15 NOTE — Patient Instructions (Signed)
Your physician wants you to follow-up in:  6 months. You will receive a reminder letter in the mail two months in advance. If you don't receive a letter, please call our office to schedule the follow-up appointment.   

## 2014-02-19 ENCOUNTER — Encounter: Payer: Self-pay | Admitting: Gastroenterology

## 2014-02-26 ENCOUNTER — Telehealth: Payer: Self-pay | Admitting: Cardiovascular Disease

## 2014-02-26 NOTE — Telephone Encounter (Signed)
Spoke with pt. She is having foot / leg numbness and back pain due to swelling on nerve.  Needs lumbar injection. Pt needs approval to proceed with injection from cardiology.   Will need to stop Plavix 7 days prior to procedure. Does not need to stop ASA.  Will send to Dr. Angelena Form for recommendation.  Procedure to be done by Dr. Deno Etienne.  Phone is [phone]. Fax is [phone].

## 2014-02-26 NOTE — Telephone Encounter (Signed)
New Message     Patient needs to speak to nurse about stopping Plavix before she has another procedure (03/12/14) A lumbar injection. Please give patient a call

## 2014-02-27 ENCOUNTER — Encounter: Payer: Self-pay | Admitting: Cardiovascular Disease

## 2014-02-27 NOTE — Telephone Encounter (Signed)
F/U         Pt is returning call. Please call back.

## 2014-02-27 NOTE — Telephone Encounter (Signed)
This has been done and faxed to Dr Deno Etienne [phone].  LMTCB for pt to let her know this has been done.

## 2014-02-27 NOTE — Telephone Encounter (Signed)
I typed a letter for her clearance. Fraser Din is out until Thursday. Can someone in Triage please print out her letter and fax to the number below? Thanks, chris

## 2014-02-27 NOTE — Telephone Encounter (Signed)
Pt advised this has been done and faxed to Dr Deno Etienne.

## 2014-03-19 ENCOUNTER — Encounter: Payer: Self-pay | Admitting: Gastroenterology

## 2014-04-26 ENCOUNTER — Telehealth: Payer: Self-pay | Admitting: *Deleted

## 2014-04-26 ENCOUNTER — Encounter: Payer: Self-pay | Admitting: Cardiovascular Disease

## 2014-04-26 NOTE — Telephone Encounter (Signed)
Spoke with pt regarding my chart message she sent. She reports gradual increase in shortness of breath over last month. Chest tightness at times with this.  Low energy level. Was working outside today in yard and felt worn out after 10 minutes.  Appt made for pt to see Truitt Merle, NP tomorrow at 9 AM

## 2014-04-26 NOTE — Telephone Encounter (Signed)
Thanks, chris 

## 2014-04-27 ENCOUNTER — Encounter: Payer: Self-pay | Admitting: Nurse Practitioner

## 2014-04-27 ENCOUNTER — Ambulatory Visit
Admission: RE | Admit: 2014-04-27 | Discharge: 2014-04-27 | Disposition: A | Discharge status: Home / Self Care | Payer: Medicare Other | Source: Ambulatory Visit | Attending: Nurse Practitioner | Admitting: Nurse Practitioner

## 2014-04-27 ENCOUNTER — Other Ambulatory Visit: Payer: Self-pay | Admitting: Nurse Practitioner

## 2014-04-27 ENCOUNTER — Ambulatory Visit (INDEPENDENT_AMBULATORY_CARE_PROVIDER_SITE_OTHER): Payer: Medicare Other | Admitting: Nurse Practitioner

## 2014-04-27 ENCOUNTER — Other Ambulatory Visit: Payer: Self-pay

## 2014-04-27 VITALS — BP 128/62 | HR 69 | Resp 18 | Ht 61.0 in | Wt 128.0 lb

## 2014-04-27 DIAGNOSIS — I209 Angina pectoris, unspecified: Secondary | ICD-10-CM

## 2014-04-27 DIAGNOSIS — Z7901 Long term (current) use of anticoagulants: Secondary | ICD-10-CM | POA: Diagnosis not present

## 2014-04-27 DIAGNOSIS — R5382 Chronic fatigue, unspecified: Secondary | ICD-10-CM | POA: Diagnosis not present

## 2014-04-27 DIAGNOSIS — R0600 Dyspnea, unspecified: Secondary | ICD-10-CM

## 2014-04-27 DIAGNOSIS — Z79899 Other long term (current) drug therapy: Secondary | ICD-10-CM

## 2014-04-27 DIAGNOSIS — E785 Hyperlipidemia, unspecified: Secondary | ICD-10-CM

## 2014-04-27 DIAGNOSIS — I10 Essential (primary) hypertension: Secondary | ICD-10-CM

## 2014-04-27 DIAGNOSIS — I200 Unstable angina: Secondary | ICD-10-CM

## 2014-04-27 DIAGNOSIS — Z01812 Encounter for preprocedural laboratory examination: Secondary | ICD-10-CM

## 2014-04-27 DIAGNOSIS — I2510 Atherosclerotic heart disease of native coronary artery without angina pectoris: Secondary | ICD-10-CM

## 2014-04-27 DIAGNOSIS — Z01810 Encounter for preprocedural cardiovascular examination: Secondary | ICD-10-CM

## 2014-04-27 LAB — BASIC METABOLIC PANEL
BUN: 11 mg/dL (ref 6–23)
CO2: 31 mEq/L (ref 19–32)
Calcium: 9.7 mg/dL (ref 8.4–10.5)
Chloride: 92 mEq/L — ABNORMAL LOW (ref 96–112)
Creatinine, Ser: 0.9 mg/dL (ref 0.40–1.20)
GFR: 65.47 mL/min (ref >60.00)
Glucose, Bld: 98 mg/dL (ref 70–99)
Potassium: 3.9 mEq/L (ref 3.5–5.1)
Sodium: 128 mEq/L — ABNORMAL LOW (ref 135–145)

## 2014-04-27 LAB — APTT: aPTT: 30.1 s (ref 23.4–32.7)

## 2014-04-27 LAB — CBC
HCT: 37 % (ref 36.0–46.0)
Hemoglobin: 12.7 g/dL (ref 12.0–15.0)
MCHC: 34.3 g/dL (ref 30.0–36.0)
MCV: 93.8 fl (ref 78.0–100.0)
Platelets: 273 10*3/uL (ref 150.0–400.0)
RBC: 3.94 Mil/uL (ref 3.87–5.11)
RDW: 12.8 % (ref 11.5–15.5)
WBC: 8.1 10*3/uL (ref 4.0–10.5)

## 2014-04-27 LAB — PROTIME-INR
INR: 0.9 ratio (ref 0.8–1.0)
Prothrombin Time: 10.5 s (ref 9.6–13.1)

## 2014-04-27 MED ORDER — ISOSORBIDE MONONITRATE ER 60 MG PO TB24: 60.0000 mg | ORAL_TABLET | Freq: Every day | ORAL | Status: DC

## 2014-04-27 NOTE — Progress Notes (Signed)
CARDIOLOGY OFFICE NOTE  Date:  04/27/2014    Liana Gerold Date of Birth: [birth year] Medical Record #[id number]  PCP:  PROVIDER NOT IN SYSTEM  Cardiologist:  [facility]  Chief Complaint  Patient presents with  . Shortness of Breath    Work in visit - seen for Dr. Angelena Form  . Fatigue     History of Present Illness: Rhonda Hughes is a 74 y.o. female who presents today for a work in visit. Seen for Dr. Angelena Form. history of CAD, HTN, HLD, anxiety, Barrett's esophagus.She had been seen by Dr. Chase Caller prior to her first visit her and her CT chest showed calcifications in the left main and all three major coronary arteries. I arranged a cardiac cath on 10/16/11. She was found to have a moderate 70% stenosis in the first obtuse marginal branch and a 90% stenosis in the mid LAD. FFR of the obtuse marginal stenosis was 0.98 and following infusion of adenosine was 0.89 suggesting this stenosis was not flow limiting.The flow wire could not be navigated into the LAD. This stenosis appeared critical. She had overlapping stents in the mid LAD. (2.5 x 24 mm Promus Element with more proximal overlapping 2.75 x 8 mm Promus Element DES).   She had recurrence of chest pain and was seen by Truitt Merle, NP on 02/25/12 and had c/o worsened dypsnea. I arranged a repeat cath on 03/07/12 and a 2.75 x 12 mm Promus Premier DES was placed in the OM branch.   Seen on 11/30/12 and she had c/o fatigue and SOB. Stress myoview without ischemia on images but she did have EKG changes suggestive of ischemia during exercise. Cardiac cath 12/14/12 with patent stents, moderate disease. IVUS of left main and proximal LAD confirmed mild plaque disease.   She was last seen here in December of 2015 by Dr. Angelena Form.  Phone call yesterday - "Spoke with pt regarding my chart message she sent. She reports gradual increase in shortness of breath over last month. Chest tightness at times with this. Low energy level. Was working  outside today in yard and felt worn out after 10 minutes. Appt made for pt to see Truitt Merle, NP tomorrow at 9 AM"  Thus added to the FLEX for today.  Comes in today. Here alone. Was doing ok at her last visit and then over the last month - more short of breath. She is ok at rest. Worse with activity. Walked a hill last week and was short of breath. Tried to work in the yard and got short of breath and was fatigued. Getting more worse. No active chest pain - has had some tightness however. No NTG used - if she stops her activity her symptoms will go away. She is on chronic Imdur. She is not smoking.   Past Medical History  Diagnosis Date  . Hyperlipidemia   . HTN (hypertension)   . Anxiety   . Diverticulosis   . Barrett's esophagus without dysplasia   . Rectocele   . CAD (coronary artery disease)     a. LHC 10/16/11: dLM 20%, mLAD 90%, pOM1 30%, mOM1 70% (FFR not hemodynamically significant), prox and mid RCA 30%, EF 65%;  b. PCI 10/16/11: Promus DES x 2 to mLAD  . COPD (chronic obstructive pulmonary disease)     mild  . Shortness of breath     before caediac stent  . Depression   . GERD (gastroesophageal reflux disease)   . Exertional angina 03/08/2012  Past Surgical History  Procedure Laterality Date  . Partial hysterectomy  1980s  . Colonoscopy  01/2008    multiple diverticula in desc and sigm colon  . Esophagogastroduodenoscopy  01/2008    Barrett's esophagus, gastritis, no h.pylori, due follow-up 01/2011  . Flexible sigmoidoscopy  08/11/2010 RECTAL PAIN/PRESSURE    SML IH, TICS  . Esophagogastroduodenoscopy  04/06/2011    Procedure: ESOPHAGOGASTRODUODENOSCOPY (EGD);  Surgeon: Danie Binder, MD;  Location: [facility];  Service: Endoscopy;  Laterality: N/A;  8:30  . Abdominal hysterectomy    . Cardiac catheterization    . Fractional flow reserve wire N/A 10/16/2011    Procedure: FRACTIONAL FLOW RESERVE WIRE;  Surgeon: [name], MD;  Location: [facility] CATH LAB;   Service: Cardiovascular;  Laterality: N/A;  . Left heart catheterization with coronary angiogram N/A 03/07/2012    Procedure: LEFT HEART CATHETERIZATION WITH CORONARY ANGIOGRAM;  Surgeon: [name], MD;  Location: [facility] CATH LAB;  Service: Cardiovascular;  Laterality: N/A;  . Left heart catheterization with coronary angiogram N/A 12/14/2012    Procedure: LEFT HEART CATHETERIZATION WITH CORONARY ANGIOGRAM;  Surgeon: [name], MD;  Location: [facility] CATH LAB;  Service: Cardiovascular;  Laterality: N/A;     Medications: Current Outpatient Prescriptions  Medication Sig Dispense Refill  . ALPRAZolam (XANAX) 0.25 MG tablet Take 0.25 mg by mouth daily as needed. For anxiety    . aspirin 81 MG chewable tablet Chew 81 mg by mouth daily.    Marland Kitchen atorvastatin (LIPITOR) 10 MG tablet Take 10 mg by mouth daily.     . Biotin 5000 MCG CAPS Take by mouth 2 (two) times daily.    . Calcium Carbonate-Vitamin D (CALCIUM + D) 600-200 MG-UNIT TABS Take 1 tablet by mouth 2 (two) times daily.     . Cholecalciferol (VITAMIN D-3) 1000 UNITS CAPS Take by mouth.    . clopidogrel (PLAVIX) 75 MG tablet Take 1 tablet (75 mg total) by mouth daily with  breakfast. 90 tablet 3  . lisinopril-hydrochlorothiazide (PRINZIDE,ZESTORETIC) 20-12.5 MG per tablet Take 1 tablet by mouth at bedtime.    . niacin 500 MG tablet Take 500 mg by mouth at bedtime.    . Omega-3 Fatty Acids (FISH OIL) 1000 MG CAPS Take by mouth.    . pantoprazole (PROTONIX) 40 MG tablet Take 1 tablet by mouth  daily 90 tablet 3  . potassium chloride SA (K-DUR,KLOR-CON) 20 MEQ tablet Take 40 mEq by mouth every morning.    . venlafaxine XR (EFFEXOR-XR) 37.5 MG 24 hr capsule Take 37.5 mg by mouth at bedtime.    . vitamin B-12 (CYANOCOBALAMIN) 1000 MCG tablet Take 2,000 mcg by mouth daily.    . isosorbide mononitrate (IMDUR) 60 MG 24 hr tablet Take 1 tablet (60 mg total) by mouth daily. 90 tablet 3  . nitroGLYCERIN (NITROSTAT) 0.4 MG SL tablet Place  0.4 mg under the tongue every 5 (five) minutes as needed for chest pain.    . [DISCONTINUED] niacin (NIASPAN) 500 MG CR tablet Take 500 mg by mouth at bedtime.       No current facility-administered medications for this visit.    Allergies: Allergies  Allergen Reactions  . Adhesive [Tape] Other (See Comments)    Turns skin red  . Sulfa Antibiotics Other (See Comments)    Childhood allergy    Social History: The patient  reports that she quit smoking about 7 years ago. Her smoking use included Cigarettes. She has a 53 pack-year smoking history.  She has never used smokeless tobacco. She reports that she does not drink alcohol or use illicit drugs.   Family History: The patient's family history includes Coronary artery disease in her paternal aunt and paternal uncle; Coronary artery disease (age of onset: 80) in her father; GI problems in her mother; Heart attack (age of onset: 73) in her father. There is no history of Colon cancer.   Review of Systems: Please see the history of present illness.   Otherwise, the review of systems is positive for anxiety.   All other systems are reviewed and negative.   Physical Exam: VS:  BP 128/62 mmHg  Pulse 69  Resp 18  Ht 5\' 1"  (1.549 m)  Wt 128 lb (58.06 kg)  BMI 24.20 kg/m2  SpO2 94% .  BMI Body mass index is 24.2 kg/(m^2).  Wt Readings from Last 3 Encounters:  04/27/14 128 lb (58.06 kg)  01/15/14 131 lb 6.4 oz (59.603 kg)  07/19/13 130 lb (58.968 kg)    General: Pleasant. Well developed, well nourished and in no acute distress.  HEENT: Normal. Neck: Supple, no JVD, carotid bruits, or masses noted.  Cardiac: Regular rate and rhythm. No murmurs, rubs, or gallops. No edema.  Respiratory:  Lungs are clear to auscultation bilaterally with normal work of breathing.  GI: Soft and nontender.  MS: No deformity or atrophy. Gait and ROM intact. Skin: Warm and dry. Color is normal.  Neuro:  Strength and sensation are intact and no gross focal  deficits noted.  Psych: Alert, appropriate and with normal affect.   LABORATORY DATA:  EKG:  EKG is ordered today. This demonstrates NSR.  Lab Results  Component Value Date   WBC 9.1 12/12/2012   HGB 13.0 12/12/2012   HCT 38.7 12/12/2012   PLT 270.0 12/12/2012   GLUCOSE 81 12/12/2012   ALT 18 10/20/2011   AST 23 10/20/2011   NA 136 12/12/2012   K 3.5 12/12/2012   CL 98 12/12/2012   CREATININE 1.1 12/12/2012   BUN 11 12/12/2012   CO2 33* 12/12/2012   TSH 0.41 09/28/2012   INR 1.0 12/12/2012    BNP (last 3 results) No results for input(s): BNP in the last 8760 hours.  ProBNP (last 3 results) No results for input(s): PROBNP in the last 8760 hours.   Other Studies Reviewed Today: Cardiac Cath 12/14/12: Left main: The distal vessel has 30% stenosis. This was closely examined in multiple views and did not appear to significant.  Left Anterior Descending Artery: Moderate sized vessel that courses to the apex. The ostium has a 30-40% stenosis. The proximal vessel has diffuse plaque. There are patent overlapping stents in the mid segment with 30% restenosis in the distal stented segmetn. There is calcification in the proximal and mid vessel. The diagonal branch is very small.  Circumflex Artery: Moderate sized vessel with moderate sized obtuse marginal branch. The first obtuse marginal branch has 30% proximal stenosis and a patent stent in the mid segment with no restenosis. The AV groove Circumflex is small in caliber beyond the takeoff of the marginal branch.  Right Coronary Artery: Moderate sized dominant vessel with diffuse 30% stenosis in the proximal and mid vessel. No flow limiting lesions noted.  Left Ventricular Angiogram: Deferred.  Impression:  1. Double vessel CAD with patent stents in the LAD and Circumflex  2. Mild to moderate distal left main and ostial LAD disease that is evaluated with IVUS and is not severe  3. Moderate proximal LAD disease,  moderate by  IVUS  4. No flow limiting lesions noted  Assessment and Plan:   1. CAD: now with recurrent chest tightness and DOE - will increase her Imdur to 60 mg a day - plan for repeat cardiac cath with Dr. Angelena Form - first available date is 4/18 - she is to limit activities in the interim. The patient understands that risks include but are not limited to stroke (1 in 1000), death (1 in 47), kidney failure [usually temporary] (1 in 500), bleeding (1 in 200), allergic reaction [possibly serious] (1 in 200), and agrees to proceed.   2. HTN: BP well controlled. No changes.   3. HLD: Continue statin.   4. Past tobacco use - remains abstained.         Current medicines are reviewed with the patient today.  The patient does not have concerns regarding medicines other than what has been noted above.  The following changes have been made:  See above.  Labs/ tests ordered today include:    Orders Placed This Encounter  Procedures  . DG Chest 2 View  . Basic metabolic panel  . CBC  . Protime-INR  . APTT  . EKG 12-Lead     Disposition:   Cardiac cath on 4/18 with Dr. Angelena Form  Patient is agreeable to this plan and will call if any problems develop in the interim.   Signed: Burtis Junes, RN, ANP-C 04/27/2014 9:28 AM  [facility] [address] [place], [place]  [address] Phone: [phone] Fax: [phone]

## 2014-04-27 NOTE — Patient Instructions (Addendum)
We will be checking the following labs today BMET, CBC, PT & PTT  Please go to [facility] [place] on the first floor for a chest Xray - you may walk in.   Stay on your current medicines but I am increase the Isosorbide to 60 mg a day - take two of your 30's to make this dose and the RX for the 60 mg is at the drug store.  Your provider has recommended a cardiac catheterization with Dr. Shirley Friar are scheduled for a cardiac catheterization on Monday, April 18th at [place] with Dr. Angelena Form or associate.  Go to [facility] 2nd Floor Short Stay on Monday, April 18th at [facility] thru the [employer] entrance A No food or drink after midnight on Sunday night on April 17th. You may take your medications with a sip of water on the day of your procedure.   Coronary Angiogram A coronary angiogram, also called coronary angiography, is an X-ray procedure used to look at the arteries in the heart. In this procedure, a dye (contrast dye) is injected through a long, hollow tube (catheter). The catheter is about the size of a piece of cooked spaghetti and is inserted through your groin, wrist, or arm. The dye is injected into each artery, and X-rays are then taken to show if there is a blockage in the arteries of your heart.  LET [facility] CARE PROVIDER KNOW ABOUT:  Any allergies you have, including allergies to shellfish or contrast dye.   All medicines you are taking, including vitamins, herbs, eye drops, creams, and over-the-counter medicines.   Previous problems you or members of your family have had with the use of anesthetics.   Any blood disorders you have.   Previous surgeries you have had.  History of kidney problems or failure.   Other medical conditions you have.  RISKS AND COMPLICATIONS  Generally, a coronary angiogram is a safe procedure. However, about 1 person out of 1000 can have problems that may include:  Allergic reaction to the  dye.  Bleeding/bruising from the access site or other locations.  Kidney injury, especially in people with impaired kidney function.  Stroke (rare).  Heart attack (rare).  Irregular rhythms (rare)  Death (rare)  BEFORE THE PROCEDURE   Do not eat or drink anything after midnight the night before the procedure or as directed by your health care provider.   Ask your health care provider about changing or stopping your regular medicines. This is especially important if you are taking diabetes medicines or blood thinners.  PROCEDURE  You may be given a medicine to help you relax (sedative) before the procedure. This medicine is given through an intravenous (IV) access tube that is inserted into one of your veins.   The area where the catheter will be inserted will be washed and shaved. This is usually done in the groin but may be done in the fold of your arm (near your elbow) or in the wrist.   A medicine will be given to numb the area where the catheter will be inserted (local anesthetic).   The health care provider will insert the catheter into an artery. The catheter will be guided by using a special type of X-ray (fluoroscopy) of the blood vessel being examined.   A special dye will then be injected into the catheter, and X-rays will be taken. The dye will help to show where any narrowing or blockages are located in the heart  arteries.    AFTER THE PROCEDURE   If the procedure is done through the leg, you will be kept in bed lying flat for several hours. You will be instructed to not bend or cross your legs.  The insertion site will be checked frequently.   The pulse in your feet or wrist will be checked frequently.   Additional blood tests, X-rays, and an electrocardiogram may be done.

## 2014-05-04 ENCOUNTER — Other Ambulatory Visit (INDEPENDENT_AMBULATORY_CARE_PROVIDER_SITE_OTHER): Payer: Medicare Other | Admitting: *Deleted

## 2014-05-04 DIAGNOSIS — Z01812 Encounter for preprocedural laboratory examination: Secondary | ICD-10-CM

## 2014-05-04 LAB — BASIC METABOLIC PANEL
BUN: 10 mg/dL (ref 6–23)
CO2: 28 mEq/L (ref 19–32)
Calcium: 9.8 mg/dL (ref 8.4–10.5)
Chloride: 93 mEq/L — ABNORMAL LOW (ref 96–112)
Creatinine, Ser: 0.98 mg/dL (ref 0.40–1.20)
GFR: 59.34 mL/min — ABNORMAL LOW (ref >60.00)
Glucose, Bld: 78 mg/dL (ref 70–99)
Potassium: 3.9 mEq/L (ref 3.5–5.1)
Sodium: 130 mEq/L — ABNORMAL LOW (ref 135–145)

## 2014-05-04 NOTE — Addendum Note (Signed)
Addended by: Eulis Foster on: 05/04/2014 11:19 AM   Modules accepted: Orders

## 2014-05-07 ENCOUNTER — Encounter (HOSPITAL_COMMUNITY): Payer: Self-pay | Admitting: Cardiovascular Disease

## 2014-05-07 ENCOUNTER — Encounter (HOSPITAL_COMMUNITY)
Admission: RE | Disposition: A | Discharge status: Home / Self Care | Payer: Self-pay | Source: Ambulatory Visit | Attending: Cardiovascular Disease

## 2014-05-07 ENCOUNTER — Ambulatory Visit (HOSPITAL_COMMUNITY)
Admission: RE | Admit: 2014-05-07 | Discharge: 2014-05-07 | Disposition: A | Discharge status: Home / Self Care | Payer: Medicare Other | Source: Ambulatory Visit | Attending: Cardiovascular Disease | Admitting: Cardiovascular Disease

## 2014-05-07 DIAGNOSIS — J449 Chronic obstructive pulmonary disease, unspecified: Secondary | ICD-10-CM | POA: Diagnosis not present

## 2014-05-07 DIAGNOSIS — F329 Major depressive disorder, single episode, unspecified: Secondary | ICD-10-CM | POA: Insufficient documentation

## 2014-05-07 DIAGNOSIS — Z90710 Acquired absence of both cervix and uterus: Secondary | ICD-10-CM | POA: Diagnosis not present

## 2014-05-07 DIAGNOSIS — Z7902 Long term (current) use of antithrombotics/antiplatelets: Secondary | ICD-10-CM | POA: Insufficient documentation

## 2014-05-07 DIAGNOSIS — F419 Anxiety disorder, unspecified: Secondary | ICD-10-CM | POA: Insufficient documentation

## 2014-05-07 DIAGNOSIS — I10 Essential (primary) hypertension: Secondary | ICD-10-CM | POA: Insufficient documentation

## 2014-05-07 DIAGNOSIS — Z7982 Long term (current) use of aspirin: Secondary | ICD-10-CM | POA: Diagnosis not present

## 2014-05-07 DIAGNOSIS — R079 Chest pain, unspecified: Secondary | ICD-10-CM | POA: Diagnosis present

## 2014-05-07 DIAGNOSIS — Z8673 Personal history of transient ischemic attack (TIA), and cerebral infarction without residual deficits: Secondary | ICD-10-CM | POA: Diagnosis not present

## 2014-05-07 DIAGNOSIS — Z882 Allergy status to sulfonamides status: Secondary | ICD-10-CM | POA: Insufficient documentation

## 2014-05-07 DIAGNOSIS — Z87891 Personal history of nicotine dependence: Secondary | ICD-10-CM | POA: Insufficient documentation

## 2014-05-07 DIAGNOSIS — E785 Hyperlipidemia, unspecified: Secondary | ICD-10-CM | POA: Insufficient documentation

## 2014-05-07 DIAGNOSIS — K219 Gastro-esophageal reflux disease without esophagitis: Secondary | ICD-10-CM | POA: Insufficient documentation

## 2014-05-07 DIAGNOSIS — I200 Unstable angina: Secondary | ICD-10-CM

## 2014-05-07 DIAGNOSIS — I2510 Atherosclerotic heart disease of native coronary artery without angina pectoris: Secondary | ICD-10-CM | POA: Diagnosis not present

## 2014-05-07 HISTORY — PX: LEFT HEART CATHETERIZATION WITH CORONARY ANGIOGRAM: SHX5451

## 2014-05-07 LAB — BASIC METABOLIC PANEL
Anion gap: 11 (ref 5–15)
BUN: 9 mg/dL (ref 6–23)
CO2: 27 mmol/L (ref 19–32)
Calcium: 9.4 mg/dL (ref 8.4–10.5)
Chloride: 93 mmol/L — ABNORMAL LOW (ref 96–112)
Creatinine, Ser: 1.02 mg/dL (ref 0.50–1.10)
GFR calc Af Amer: 63 mL/min — ABNORMAL LOW (ref >90)
GFR calc non Af Amer: 54 mL/min — ABNORMAL LOW (ref >90)
Glucose, Bld: 100 mg/dL — ABNORMAL HIGH (ref 70–99)
Potassium: 3.5 mmol/L (ref 3.5–5.1)
Sodium: 131 mmol/L — ABNORMAL LOW (ref 135–145)

## 2014-05-07 SURGERY — LEFT HEART CATHETERIZATION WITH CORONARY ANGIOGRAM
Anesthesia: LOCAL

## 2014-05-07 MED ORDER — SODIUM CHLORIDE 0.9 % IJ SOLN: 3.0000 mL | INTRAMUSCULAR | Status: DC | PRN

## 2014-05-07 MED ORDER — MIDAZOLAM HCL 2 MG/2ML IJ SOLN
INTRAMUSCULAR | Status: AC
Start: 2014-05-07 — End: 2014-05-07
  Filled 2014-05-07: qty 2

## 2014-05-07 MED ORDER — SODIUM CHLORIDE 0.9 % IV BOLUS (SEPSIS)
500.0000 mL | Freq: Once | INTRAVENOUS | Status: AC
  Administered 2014-05-07: 1000 mL via INTRAVENOUS

## 2014-05-07 MED ORDER — LIDOCAINE HCL (PF) 1 % IJ SOLN
INTRAMUSCULAR | Status: AC
  Filled 2014-05-07: qty 30

## 2014-05-07 MED ORDER — HEPARIN (PORCINE) IN NACL 2-0.9 UNIT/ML-% IJ SOLN
INTRAMUSCULAR | Status: AC
  Filled 2014-05-07: qty 1000

## 2014-05-07 MED ORDER — SODIUM CHLORIDE 0.9 % IV BOLUS (SEPSIS): 500.0000 mL | Freq: Once | INTRAVENOUS | Status: DC

## 2014-05-07 MED ORDER — VERAPAMIL HCL 2.5 MG/ML IV SOLN
INTRAVENOUS | Status: AC
Start: 2014-05-07 — End: 2014-05-07
  Filled 2014-05-07: qty 2

## 2014-05-07 MED ORDER — SODIUM CHLORIDE 0.9 % IJ SOLN: 3.0000 mL | Freq: Two times a day (BID) | INTRAMUSCULAR | Status: DC

## 2014-05-07 MED ORDER — ASPIRIN 81 MG PO CHEW: 81.0000 mg | CHEWABLE_TABLET | ORAL | Status: DC

## 2014-05-07 MED ORDER — SODIUM CHLORIDE 0.9 % IV SOLN
INTRAVENOUS | Status: DC
  Administered 2014-05-07: 07:00:00 via INTRAVENOUS

## 2014-05-07 MED ORDER — SODIUM CHLORIDE 0.9 % IV SOLN: INTRAVENOUS | Status: AC

## 2014-05-07 MED ORDER — DIAZEPAM 5 MG PO TABS
5.0000 mg | ORAL_TABLET | ORAL | Status: AC
  Administered 2014-05-07: 5 mg via ORAL

## 2014-05-07 MED ORDER — FENTANYL CITRATE (PF) 100 MCG/2ML IJ SOLN
INTRAMUSCULAR | Status: AC
  Filled 2014-05-07: qty 2

## 2014-05-07 MED ORDER — HEPARIN SODIUM (PORCINE) 1000 UNIT/ML IJ SOLN
INTRAMUSCULAR | Status: AC
  Filled 2014-05-07: qty 1

## 2014-05-07 MED ORDER — DIAZEPAM 5 MG PO TABS
ORAL_TABLET | ORAL | Status: AC
  Administered 2014-05-07: 5 mg via ORAL
  Filled 2014-05-07: qty 1

## 2014-05-07 MED ORDER — NITROGLYCERIN 1 MG/10 ML FOR IR/CATH LAB
INTRA_ARTERIAL | Status: AC
  Filled 2014-05-07: qty 10

## 2014-05-07 MED ORDER — SODIUM CHLORIDE 0.9 % IV SOLN
INTRAVENOUS | Status: DC
  Administered 2014-05-07: 15:00:00 via INTRAVENOUS

## 2014-05-07 MED ORDER — SODIUM CHLORIDE 0.9 % IV SOLN: 250.0000 mL | INTRAVENOUS | Status: DC | PRN

## 2014-05-07 NOTE — Progress Notes (Signed)
Dr. Angelena Form notified of pt bleeding when TRB removed. MD at bedside to evaluate pt.

## 2014-05-07 NOTE — Progress Notes (Signed)
Pt assisted to bathroom via wheelchair with no further light headedness or complaints.

## 2014-05-07 NOTE — Interval H&P Note (Signed)
History and Physical Interval Note:  05/07/2014 7:43 AM  Rhonda Hughes  has presented today for cardiac cath with the diagnosis of unstable angina.  The various methods of treatment have been discussed with the patient and family. After consideration of risks, benefits and other options for treatment, the patient has consented to  Procedure(s): LEFT HEART CATHETERIZATION WITH CORONARY ANGIOGRAM (N/A) as a surgical intervention .  The patient's history has been reviewed, patient examined, no change in status, stable for surgery.  I have reviewed the patient's chart and labs.  Questions were answered to the patient's satisfaction.    Cath Lab Visit (complete for each Cath Lab visit)  Clinical Evaluation Leading to the Procedure:   ACS: No.  Non-ACS:    Anginal Classification: CCS III  Anti-ischemic medical therapy: Minimal Therapy (1 class of medications)  Non-Invasive Test Results: No non-invasive testing performed  Prior CABG: No previous CABG         MCALHANY,CHRISTOPHER

## 2014-05-07 NOTE — Progress Notes (Signed)
Dr. Julianne Handler notified of blood pressure, another normal saline bolus initiated.

## 2014-05-07 NOTE — H&P (View-Only) (Signed)
CARDIOLOGY OFFICE NOTE  Date:  04/27/2014    Liana Gerold Date of Birth: Sep 19, 1940 Medical Record #[id number]  PCP:  PROVIDER NOT IN SYSTEM  Cardiologist:  [facility]  Chief Complaint  Patient presents with  . Shortness of Breath    Work in visit - seen for Dr. Angelena Form  . Fatigue     History of Present Illness: Rhonda Hughes is a 74 y.o. female who presents today for a work in visit. Seen for Dr. Angelena Form. history of CAD, HTN, HLD, anxiety, Barrett's esophagus.She had been seen by Dr. Chase Caller prior to her first visit her and her CT chest showed calcifications in the left main and all three major coronary arteries. I arranged a cardiac cath on 10/16/11. She was found to have a moderate 70% stenosis in the first obtuse marginal branch and a 90% stenosis in the mid LAD. FFR of the obtuse marginal stenosis was 0.98 and following infusion of adenosine was 0.89 suggesting this stenosis was not flow limiting.The flow wire could not be navigated into the LAD. This stenosis appeared critical. She had overlapping stents in the mid LAD. (2.5 x 24 mm Promus Element with more proximal overlapping 2.75 x 8 mm Promus Element DES).   She had recurrence of chest pain and was seen by Truitt Merle, NP on 02/25/12 and had c/o worsened dypsnea. I arranged a repeat cath on 03/07/12 and a 2.75 x 12 mm Promus Premier DES was placed in the OM branch.   Seen on 11/30/12 and she had c/o fatigue and SOB. Stress myoview without ischemia on images but she did have EKG changes suggestive of ischemia during exercise. Cardiac cath 12/14/12 with patent stents, moderate disease. IVUS of left main and proximal LAD confirmed mild plaque disease.   She was last seen here in December of 2015 by Dr. Angelena Form.  Phone call yesterday - "Spoke with pt regarding my chart message she sent. She reports gradual increase in shortness of breath over last month. Chest tightness at times with this. Low energy level. Was working  outside today in yard and felt worn out after 10 minutes. Appt made for pt to see Truitt Merle, NP tomorrow at 9 AM"  Thus added to the FLEX for today.  Comes in today. Here alone. Was doing ok at her last visit and then over the last month - more short of breath. She is ok at rest. Worse with activity. Walked a hill last week and was short of breath. Tried to work in the yard and got short of breath and was fatigued. Getting more worse. No active chest pain - has had some tightness however. No NTG used - if she stops her activity her symptoms will go away. She is on chronic Imdur. She is not smoking.   Past Medical History  Diagnosis Date  . Hyperlipidemia   . HTN (hypertension)   . Anxiety   . Diverticulosis   . Barrett's esophagus without dysplasia   . Rectocele   . CAD (coronary artery disease)     a. LHC 10/16/11: dLM 20%, mLAD 90%, pOM1 30%, mOM1 70% (FFR not hemodynamically significant), prox and mid RCA 30%, EF 65%;  b. PCI 10/16/11: Promus DES x 2 to mLAD  . COPD (chronic obstructive pulmonary disease)     mild  . Shortness of breath     before caediac stent  . Depression   . GERD (gastroesophageal reflux disease)   . Exertional angina 03/08/2012  Past Surgical History  Procedure Laterality Date  . Partial hysterectomy  1980s  . Colonoscopy  01/2008    multiple diverticula in desc and sigm colon  . Esophagogastroduodenoscopy  01/2008    Barrett's esophagus, gastritis, no h.pylori, due follow-up 01/2011  . Flexible sigmoidoscopy  08/11/2010 RECTAL PAIN/PRESSURE    SML IH, TICS  . Esophagogastroduodenoscopy  04/06/2011    Procedure: ESOPHAGOGASTRODUODENOSCOPY (EGD);  Surgeon: Danie Binder, MD;  Location: [facility];  Service: Endoscopy;  Laterality: N/A;  8:30  . Abdominal hysterectomy    . Cardiac catheterization    . Fractional flow reserve wire N/A 10/16/2011    Procedure: FRACTIONAL FLOW RESERVE WIRE;  Surgeon: [name], MD;  Location: [facility] CATH LAB;   Service: Cardiovascular;  Laterality: N/A;  . Left heart catheterization with coronary angiogram N/A 03/07/2012    Procedure: LEFT HEART CATHETERIZATION WITH CORONARY ANGIOGRAM;  Surgeon: [name], MD;  Location: [facility] CATH LAB;  Service: Cardiovascular;  Laterality: N/A;  . Left heart catheterization with coronary angiogram N/A 12/14/2012    Procedure: LEFT HEART CATHETERIZATION WITH CORONARY ANGIOGRAM;  Surgeon: [name], MD;  Location: [facility] CATH LAB;  Service: Cardiovascular;  Laterality: N/A;     Medications: Current Outpatient Prescriptions  Medication Sig Dispense Refill  . ALPRAZolam (XANAX) 0.25 MG tablet Take 0.25 mg by mouth daily as needed. For anxiety    . aspirin 81 MG chewable tablet Chew 81 mg by mouth daily.    Marland Kitchen atorvastatin (LIPITOR) 10 MG tablet Take 10 mg by mouth daily.     . Biotin 5000 MCG CAPS Take by mouth 2 (two) times daily.    . Calcium Carbonate-Vitamin D (CALCIUM + D) 600-200 MG-UNIT TABS Take 1 tablet by mouth 2 (two) times daily.     . Cholecalciferol (VITAMIN D-3) 1000 UNITS CAPS Take by mouth.    . clopidogrel (PLAVIX) 75 MG tablet Take 1 tablet (75 mg total) by mouth daily with  breakfast. 90 tablet 3  . lisinopril-hydrochlorothiazide (PRINZIDE,ZESTORETIC) 20-12.5 MG per tablet Take 1 tablet by mouth at bedtime.    . niacin 500 MG tablet Take 500 mg by mouth at bedtime.    . Omega-3 Fatty Acids (FISH OIL) 1000 MG CAPS Take by mouth.    . pantoprazole (PROTONIX) 40 MG tablet Take 1 tablet by mouth  daily 90 tablet 3  . potassium chloride SA (K-DUR,KLOR-CON) 20 MEQ tablet Take 40 mEq by mouth every morning.    . venlafaxine XR (EFFEXOR-XR) 37.5 MG 24 hr capsule Take 37.5 mg by mouth at bedtime.    . vitamin B-12 (CYANOCOBALAMIN) 1000 MCG tablet Take 2,000 mcg by mouth daily.    . isosorbide mononitrate (IMDUR) 60 MG 24 hr tablet Take 1 tablet (60 mg total) by mouth daily. 90 tablet 3  . nitroGLYCERIN (NITROSTAT) 0.4 MG SL tablet Place  0.4 mg under the tongue every 5 (five) minutes as needed for chest pain.    . [DISCONTINUED] niacin (NIASPAN) 500 MG CR tablet Take 500 mg by mouth at bedtime.       No current facility-administered medications for this visit.    Allergies: Allergies  Allergen Reactions  . Adhesive [Tape] Other (See Comments)    Turns skin red  . Sulfa Antibiotics Other (See Comments)    Childhood allergy    Social History: The patient  reports that she quit smoking about 7 years ago. Her smoking use included Cigarettes. She has a 53 pack-year smoking history.  She has never used smokeless tobacco. She reports that she does not drink alcohol or use illicit drugs.   Family History: The patient's family history includes Coronary artery disease in her paternal aunt and paternal uncle; Coronary artery disease (age of onset: 9) in her father; GI problems in her mother; Heart attack (age of onset: 75) in her father. There is no history of Colon cancer.   Review of Systems: Please see the history of present illness.   Otherwise, the review of systems is positive for anxiety.   All other systems are reviewed and negative.   Physical Exam: VS:  BP 128/62 mmHg  Pulse 69  Resp 18  Ht 5\' 1"  (1.549 m)  Wt 128 lb (58.06 kg)  BMI 24.20 kg/m2  SpO2 94% .  BMI Body mass index is 24.2 kg/(m^2).  Wt Readings from Last 3 Encounters:  04/27/14 128 lb (58.06 kg)  01/15/14 131 lb 6.4 oz (59.603 kg)  07/19/13 130 lb (58.968 kg)    General: Pleasant. Well developed, well nourished and in no acute distress.  HEENT: Normal. Neck: Supple, no JVD, carotid bruits, or masses noted.  Cardiac: Regular rate and rhythm. No murmurs, rubs, or gallops. No edema.  Respiratory:  Lungs are clear to auscultation bilaterally with normal work of breathing.  GI: Soft and nontender.  MS: No deformity or atrophy. Gait and ROM intact. Skin: Warm and dry. Color is normal.  Neuro:  Strength and sensation are intact and no gross focal  deficits noted.  Psych: Alert, appropriate and with normal affect.   LABORATORY DATA:  EKG:  EKG is ordered today. This demonstrates NSR.  Lab Results  Component Value Date   WBC 9.1 12/12/2012   HGB 13.0 12/12/2012   HCT 38.7 12/12/2012   PLT 270.0 12/12/2012   GLUCOSE 81 12/12/2012   ALT 18 10/20/2011   AST 23 10/20/2011   NA 136 12/12/2012   K 3.5 12/12/2012   CL 98 12/12/2012   CREATININE 1.1 12/12/2012   BUN 11 12/12/2012   CO2 33* 12/12/2012   TSH 0.41 09/28/2012   INR 1.0 12/12/2012    BNP (last 3 results) No results for input(s): BNP in the last 8760 hours.  ProBNP (last 3 results) No results for input(s): PROBNP in the last 8760 hours.   Other Studies Reviewed Today: Cardiac Cath 12/14/12: Left main: The distal vessel has 30% stenosis. This was closely examined in multiple views and did not appear to significant.  Left Anterior Descending Artery: Moderate sized vessel that courses to the apex. The ostium has a 30-40% stenosis. The proximal vessel has diffuse plaque. There are patent overlapping stents in the mid segment with 30% restenosis in the distal stented segmetn. There is calcification in the proximal and mid vessel. The diagonal branch is very small.  Circumflex Artery: Moderate sized vessel with moderate sized obtuse marginal branch. The first obtuse marginal branch has 30% proximal stenosis and a patent stent in the mid segment with no restenosis. The AV groove Circumflex is small in caliber beyond the takeoff of the marginal branch.  Right Coronary Artery: Moderate sized dominant vessel with diffuse 30% stenosis in the proximal and mid vessel. No flow limiting lesions noted.  Left Ventricular Angiogram: Deferred.  Impression:  1. Double vessel CAD with patent stents in the LAD and Circumflex  2. Mild to moderate distal left main and ostial LAD disease that is evaluated with IVUS and is not severe  3. Moderate proximal LAD disease,  moderate by  IVUS  4. No flow limiting lesions noted  Assessment and Plan:   1. CAD: now with recurrent chest tightness and DOE - will increase her Imdur to 60 mg a day - plan for repeat cardiac cath with Dr. Angelena Form - first available date is 4/18 - she is to limit activities in the interim. The patient understands that risks include but are not limited to stroke (1 in 1000), death (1 in 63), kidney failure [usually temporary] (1 in 500), bleeding (1 in 200), allergic reaction [possibly serious] (1 in 200), and agrees to proceed.   2. HTN: BP well controlled. No changes.   3. HLD: Continue statin.   4. Past tobacco use - remains abstained.         Current medicines are reviewed with the patient today.  The patient does not have concerns regarding medicines other than what has been noted above.  The following changes have been made:  See above.  Labs/ tests ordered today include:    Orders Placed This Encounter  Procedures  . DG Chest 2 View  . Basic metabolic panel  . CBC  . Protime-INR  . APTT  . EKG 12-Lead     Disposition:   Cardiac cath on 4/18 with Dr. Angelena Form  Patient is agreeable to this plan and will call if any problems develop in the interim.   Signed: Burtis Junes, RN, ANP-C 04/27/2014 9:28 AM  [facility] [address] [place], [place]  [address] Phone: [phone] Fax: [phone]

## 2014-05-07 NOTE — Discharge Instructions (Signed)
Radial Site Care °Refer to this sheet in the next few weeks. These instructions provide you with information on caring for yourself after your procedure. Your caregiver may also give you more specific instructions. Your treatment has been planned according to current medical practices, but problems sometimes occur. Call your caregiver if you have any problems or questions after your procedure. °HOME CARE INSTRUCTIONS °· You may shower the day after the procedure. Remove the bandage (dressing) and gently wash the site with plain soap and water. Gently pat the site dry. °· Do not apply powder or lotion to the site. °· Do not submerge the affected site in water for 3 to 5 days. °· Inspect the site at least twice daily. °· Do not flex or bend the affected arm for 24 hours. °· No lifting over 5 pounds (2.3 kg) for 5 days after your procedure. °· Do not drive home if you are discharged the same day of the procedure. Have someone else drive you. °· You may drive 24 hours after the procedure unless otherwise instructed by your caregiver. °· Do not operate machinery or power tools for 24 hours. °· A responsible adult should be with you for the first 24 hours after you arrive home. °What to expect: °· Any bruising will usually fade within 1 to 2 weeks. °· Blood that collects in the tissue (hematoma) may be painful to the touch. It should usually decrease in size and tenderness within 1 to 2 weeks. °SEEK IMMEDIATE MEDICAL CARE IF: °· You have unusual pain at the radial site. °· You have redness, warmth, swelling, or pain at the radial site. °· You have drainage (other than a small amount of blood on the dressing). °· You have chills. °· You have a fever or persistent symptoms for more than 72 hours. °· You have a fever and your symptoms suddenly get worse. °· Your arm becomes pale, cool, tingly, or numb. °· You have heavy bleeding from the site. Hold pressure on the site. °Document Released: 02/07/2010 Document Revised:  03/30/2011 Document Reviewed: 02/07/2010 °ExitCare® Patient Information ©2015 [employer]. This information is not intended to replace advice given to you by your health care provider. Make sure you discuss any questions you have with your health care provider. ° °

## 2014-05-07 NOTE — Progress Notes (Signed)
Attempted to get pt to bathroom after pressure held on right radial area for 20 minutes.  No further bleeding noted.  Pt began to feel light headed and had vagal response.  Assisted back to chair, O2 at 2l/min applied.  Bp 83/38. Dr. Angelena Form notified and order for 500cc normal saline bolus given.  Bolus started:  Bp 79/53 o2 sat 98%.

## 2014-05-07 NOTE — CV Procedure (Signed)
Cardiac Catheterization Operative Report  Rhonda Hughes [id number] 4/18/20169:02 AM PROVIDER NOT IN SYSTEM  Procedure Performed:  1. Left Heart Catheterization 2. Selective Coronary Angiography 3. Left ventricular angiogram  Operator: Lauree Chandler, MD  Arterial access site:  Right radial artery.   Indication:74 yo female with history of CAD, HTN, HLD, anxiety, Barrett's esophagus who is here today for cardiac cath. I saw her as a new patient 10/14/11 for evaluation of chest pain. She reported tightness and pains in the center of her chest with exertion, dyspnea and fatigue. She had been seen by Dr. Chase Caller prior her first visit her and her CT chest showed calcifications in the left main and all three major coronary arteries. I arranged a cardiac cath on 10/16/11. She was found to have a moderate 70% stenosis in the first obtuse marginal branch and a 90% stenosis in the mid LAD. FFR of the obtuse marginal stenosis was 0.98 and following infusion of adenosine was 0.89 suggesting this stenosis was not flow limiting. I then placed overlapping stents in the mid LAD as this was felt to be the culprit lesion. (2.5 x 24 mm Promus Element with more proximal overlapping 2.75 x 8 mm Promus Element DES). She did well following the procedure. She had recurrence of chest pain and was seen by Truitt Merle, NP on 02/25/12 and had c/o worsened dypsnea. I arranged a repeat cath on 03/07/12 and a 2.75 x 12 mm Promus Premier DES was placed in the OM branch. She has continued to have fatigue, dyspnea. Stress myoview 12/08/12 with no ischemia but she did have 1 mm ST segment depression with exercise. Based on her symptoms and known disease, cath was repeated on 12/14/12 and here LAD and OM stents were patent. There was mild disease noted in the distal left main and ostial LAD. IVUS imaging confirmed that this plaque disease was mild in the left main and LAD.  She was seen in our office last week by Truitt Merle, NP with c/o fatigue and cardiac cath was arranged as this was felt to be a possible anginal equivalent.                                     Procedure Details: The risks, benefits, complications, treatment options, and expected outcomes were discussed with the patient. The patient and/or family concurred with the proposed plan, giving informed consent. The patient was brought to the cath lab after IV hydration was begun and oral premedication was given. The patient was further sedated with Versed and Fentanyl. The right wrist was assessed with a modified Allens test which was positive. The right wrist was prepped and draped in a sterile fashion. 1% lidocaine was used for local anesthesia. Using the modified Seldinger access technique, a 5/6 French sheath was placed in the right radial artery. 3 mg Verapamil was given through the sheath. 3500 units IV heparin was given. Standard diagnostic catheters were used to perform selective coronary angiography. A pigtail catheter was used to perform a left ventricular angiogram. The sheath was removed from the right radial artery and a Terumo hemostasis band was applied at the arteriotomy site on the right wrist.    There were no immediate complications. The patient was taken to the recovery area in stable condition.   Hemodynamic Findings: Central aortic pressure: 103/43 Left ventricular pressure: 107/2/10  Angiographic Findings:  Left main:  The distal vessel has 30% stenosis. This was closely examined in multiple views and did not appear to significant.   Left Anterior Descending Artery: Moderate sized vessel that courses to the apex. The ostium has a 30-40% stenosis. The proximal vessel has diffuse plaque. There are patent overlapping stents in the mid segment with 30% restenosis in the distal stented segment (unchanged from last cath). There is calcification in the proximal and mid vessel. The diagonal branch is very small.   Circumflex Artery:  Moderate sized vessel with moderate sized obtuse marginal branch. The first obtuse marginal branch has 30% proximal stenosis and a patent stent in the mid segment with no restenosis. The AV groove Circumflex is small in caliber beyond the takeoff of the marginal branch. Moderate caliber intermediate branch with mild diffuse plaque.   Right Coronary Artery: Moderate sized dominant vessel with ostial 20% stenosis, diffuse 30% stenosis in the proximal and mid vessel. No flow limiting lesions noted.   Left Ventricular Angiogram: LVEF=65-70%.   Impression: 1. Stable double vessel CAD with patent stents mid LAD and obtuse marginal 2. Mild disease distal left main and ostial LAD (unchanged angiographically from last cath. IVUS 2014 demonstrated mild plaque in the distal left main and ostial LAD).  3. Normal LV systolic function  Recommendations: Continue medical management of CAD.        Complications:  None. The patient tolerated the procedure well.

## 2014-05-07 NOTE — Progress Notes (Signed)
Bolus complete.  Assisted pt to bathroom without incident.  Pt states she feels fine.  BP 142/54. No further complaints.

## 2014-05-14 ENCOUNTER — Telehealth: Payer: Self-pay | Admitting: Gastroenterology

## 2014-05-14 NOTE — Telephone Encounter (Signed)
Noted  

## 2014-05-14 NOTE — Telephone Encounter (Signed)
Patient called back after speaking with CJ earlier regarding her request for a pill prep. She said she wants to cancel her OV so she can give it more thought of what she wants to do.

## 2014-05-14 NOTE — Telephone Encounter (Signed)
Patient called this afternoon to say that she has an OV on Wednesday to see LSL. She said she wanted to know if [facility] would let her have the pill prep for her procedure.  If not, she said it would be no reason for her to keep her appointment on Wednesday. Please advise. [phone]

## 2014-05-14 NOTE — Telephone Encounter (Signed)
Called pt and she states she is wanting a pill prep. I told her that we don't offer pill preps for procedure due to increase kidney and liver damage.  Pt states she will still come and discuss other options for clean out.

## 2014-05-15 NOTE — Telephone Encounter (Signed)
Called and spoke with pt. Explained everything to her. Pt is reluctant to have EGD at this time. States she is not having any problems. Will call back if something comes up.

## 2014-05-15 NOTE — Telephone Encounter (Signed)
REVIEWED-NO ADDITIONAL RECOMMENDATIONS. 

## 2014-05-15 NOTE — Telephone Encounter (Signed)
Please see other phone note dated 05/14/14.

## 2014-05-15 NOTE — Telephone Encounter (Signed)
Please see both telephone notes for 05/14/14 for all information.  Please investigate chart when patient calls in with questions like this. This patient was coming in for an EGD for Barrett's NOT a colonoscopy. She does not need ANY bowel prep for EGD.  Please call patient and explain this to her and I recommend she not postpone OV to schedule EGD. Her last EGD was in 2013 and she should have every 3 years.   She is not due for a colonoscopy until 2020 (unless she is having problems that we don't know about).

## 2014-05-16 ENCOUNTER — Ambulatory Visit: Payer: Medicare Other | Admitting: Gastroenterology

## 2014-05-29 ENCOUNTER — Encounter: Payer: Self-pay | Admitting: Nurse Practitioner

## 2014-05-29 ENCOUNTER — Ambulatory Visit (INDEPENDENT_AMBULATORY_CARE_PROVIDER_SITE_OTHER): Payer: Medicare Other | Admitting: Nurse Practitioner

## 2014-05-29 VITALS — BP 120/78 | HR 70 | Ht 62.0 in | Wt 128.4 lb

## 2014-05-29 DIAGNOSIS — I2510 Atherosclerotic heart disease of native coronary artery without angina pectoris: Secondary | ICD-10-CM | POA: Diagnosis not present

## 2014-05-29 DIAGNOSIS — Z9889 Other specified postprocedural states: Secondary | ICD-10-CM | POA: Diagnosis not present

## 2014-05-29 DIAGNOSIS — I200 Unstable angina: Secondary | ICD-10-CM

## 2014-05-29 NOTE — Patient Instructions (Addendum)
We will be checking the following labs today - NONE   Medication Instructions:    Continue with your current medicines.     Testing/Procedures To Be Arranged:  N/A  Follow-Up:   See Dr. Angelena Form in 4 months    Other Special Instructions:   N/A  Call the [place] office at [phone] if you have any questions, problems or concerns.

## 2014-05-29 NOTE — Progress Notes (Signed)
CARDIOLOGY OFFICE NOTE  Date:  05/29/2014    Rhonda Hughes Date of Birth: [birth year] Medical Record #[id number]  PCP:  PROVIDER NOT IN SYSTEM  Cardiologist:  [facility]  Chief Complaint  Patient presents with  . S/P cardiac cath    Seen for Dr. Angelena Form     History of Present Illness: Rhonda Hughes is a 74 y.o. female who presents today for a follow up visit. Seen for Dr. Angelena Form. She has a history of CAD, HTN, HLD, anxiety, Barrett's esophagus. She had been seen by Dr. Chase Caller prior to her first visit here and her CT chest showed calcifications in the left main and all three major coronary arteries. She had a cardiac cath on 10/16/11. She was found to have a moderate 70% stenosis in the first obtuse marginal branch and a 90% stenosis in the mid LAD. FFR of the obtuse marginal stenosis was 0.98 and following infusion of adenosine was 0.89 suggesting this stenosis was not flow limiting.The flow wire could not be navigated into the LAD. This stenosis appeared critical. She had overlapping stents in the mid LAD. (2.5 x 24 mm Promus Element with more proximal overlapping 2.75 x 8 mm Promus Element DES).   She had recurrence of chest pain and was seen by Truitt Merle, NP on 02/25/12 and had c/o worsened dypsnea. She had a repeat cath on 03/07/12 and a 2.75 x 12 mm Promus Premier DES was placed in the OM branch.   Seen on 11/30/12 and she had c/o fatigue and SOB. Stress myoview without ischemia on images but she did have EKG changes suggestive of ischemia during exercise. Cardiac cath 12/14/12 with patent stents, moderate disease. IVUS of left main and proximal LAD confirmed mild plaque disease.   She was last seen here in December of 2015 by Dr. Angelena Form.  I saw her last month in the [facility] with fatigue and dyspnea. Imdur was increased. She was referred back for cardiac cath. This was stable.   Comes in today. Here alone.  Doing ok. Still feels about the same. Tires easily. Her PCP is  working up her thyroid. She remains active. No chest pain. No real change with the increase in Imdur that we had done earlier.   Past Medical History  Diagnosis Date  . Hyperlipidemia   . HTN (hypertension)   . Anxiety   . Diverticulosis   . Barrett's esophagus without dysplasia   . Rectocele   . CAD (coronary artery disease)     a. LHC 10/16/11: dLM 20%, mLAD 90%, pOM1 30%, mOM1 70% (FFR not hemodynamically significant), prox and mid RCA 30%, EF 65%;  b. PCI 10/16/11: Promus DES x 2 to mLAD  . COPD (chronic obstructive pulmonary disease)     mild  . Shortness of breath     before caediac stent  . Depression   . GERD (gastroesophageal reflux disease)   . Exertional angina 03/08/2012    Past Surgical History  Procedure Laterality Date  . Partial hysterectomy  1980s  . Colonoscopy  01/2008    multiple diverticula in desc and sigm colon  . Esophagogastroduodenoscopy  01/2008    Barrett's esophagus, gastritis, no h.pylori, due follow-up 01/2011  . Flexible sigmoidoscopy  08/11/2010 RECTAL PAIN/PRESSURE    SML IH, TICS  . Esophagogastroduodenoscopy  04/06/2011    Procedure: ESOPHAGOGASTRODUODENOSCOPY (EGD);  Surgeon: Danie Binder, MD;  Location: [facility];  Service: Endoscopy;  Laterality: N/A;  8:30  . Abdominal hysterectomy    .  Cardiac catheterization    . Fractional flow reserve wire N/A 10/16/2011    Procedure: FRACTIONAL FLOW RESERVE WIRE;  Surgeon: [name], MD;  Location: [facility] CATH LAB;  Service: Cardiovascular;  Laterality: N/A;  . Left heart catheterization with coronary angiogram N/A 03/07/2012    Procedure: LEFT HEART CATHETERIZATION WITH CORONARY ANGIOGRAM;  Surgeon: [name], MD;  Location: [facility] CATH LAB;  Service: Cardiovascular;  Laterality: N/A;  . Left heart catheterization with coronary angiogram N/A 12/14/2012    Procedure: LEFT HEART CATHETERIZATION WITH CORONARY ANGIOGRAM;  Surgeon: [name], MD;  Location: [facility] CATH LAB;   Service: Cardiovascular;  Laterality: N/A;  . Left heart catheterization with coronary angiogram N/A 05/07/2014    Procedure: LEFT HEART CATHETERIZATION WITH CORONARY ANGIOGRAM;  Surgeon: [name], MD;  Location: [facility] CATH LAB;  Service: Cardiovascular;  Laterality: N/A;     Medications: Current Outpatient Prescriptions  Medication Sig Dispense Refill  . ALPRAZolam (XANAX) 0.25 MG tablet Take 0.25 mg by mouth daily as needed. For anxiety    . aspirin 81 MG chewable tablet Chew 81 mg by mouth daily.    Marland Kitchen atorvastatin (LIPITOR) 10 MG tablet Take 10 mg by mouth daily.     . Biotin 5000 MCG CAPS Take 1 capsule by mouth 2 (two) times daily.     . Calcium Carbonate-Vitamin D (CALCIUM + D) 600-200 MG-UNIT TABS Take 1 tablet by mouth 2 (two) times daily.     . Cholecalciferol (VITAMIN D-3) 1000 UNITS CAPS Take 1 capsule by mouth daily.     . clopidogrel (PLAVIX) 75 MG tablet Take 1 tablet (75 mg total) by mouth daily with  breakfast. 90 tablet 3  . diphenhydramine-acetaminophen (TYLENOL PM) 25-500 MG TABS Take 2 tablets by mouth at bedtime as needed (Sleep).    . docusate sodium (COLACE) 100 MG capsule Take 100-200 mg by mouth at bedtime.    . isosorbide mononitrate (IMDUR) 30 MG 24 hr tablet     . isosorbide mononitrate (IMDUR) 60 MG 24 hr tablet Take 1 tablet (60 mg total) by mouth daily. 90 tablet 3  . lisinopril-hydrochlorothiazide (PRINZIDE,ZESTORETIC) 20-12.5 MG per tablet Take 1 tablet by mouth at bedtime.    . niacin 500 MG tablet Take 500 mg by mouth at bedtime.    . Omega-3 Fatty Acids (FISH OIL) 1000 MG CAPS Take 1 capsule by mouth daily.     . pantoprazole (PROTONIX) 40 MG tablet Take 1 tablet by mouth  daily 90 tablet 3  . Polyvinyl Alcohol-Povidone (REFRESH OP) Apply 2 drops to eye daily as needed (Scratchy eyes).    . potassium chloride SA (K-DUR,KLOR-CON) 20 MEQ tablet Take 40 mEq by mouth every morning.    . venlafaxine (EFFEXOR) 37.5 MG tablet     . venlafaxine XR  (EFFEXOR-XR) 37.5 MG 24 hr capsule Take 37.5 mg by mouth at bedtime.    . vitamin B-12 (CYANOCOBALAMIN) 1000 MCG tablet Take 2,000 mcg by mouth daily.    . [DISCONTINUED] niacin (NIASPAN) 500 MG CR tablet Take 500 mg by mouth at bedtime.       No current facility-administered medications for this visit.    Allergies: Allergies  Allergen Reactions  . Adhesive [Tape] Other (See Comments)    Turns skin red  . Sulfa Antibiotics Other (See Comments)    Childhood allergy    Social History: The patient  reports that she quit smoking about 7 years ago. Her smoking use included Cigarettes. She has  a 53 pack-year smoking history. She has never used smokeless tobacco. She reports that she does not drink alcohol or use illicit drugs.   Family History: The patient's family history includes Coronary artery disease in her paternal aunt and paternal uncle; Coronary artery disease (age of onset: 68) in her father; GI problems in her mother; Heart attack (age of onset: 64) in her father. There is no history of Colon cancer.   Review of Systems: Please see the history of present illness.   Otherwise, the review of systems is positive for fatigue.   All other systems are reviewed and negative.   Physical Exam: VS:  BP 120/78 mmHg  Pulse 70  Ht '5\' 2"'$  (1.575 m)  Wt 128 lb 6.4 oz (58.242 kg)  BMI 23.48 kg/m2  SpO2 97% .  BMI Body mass index is 23.48 kg/(m^2).  Wt Readings from Last 3 Encounters:  05/29/14 128 lb 6.4 oz (58.242 kg)  04/27/14 128 lb (58.06 kg)  01/15/14 131 lb 6.4 oz (59.603 kg)    General: Pleasant. Well developed, well nourished and in no acute distress.  HEENT: Normal. Neck: Supple, no JVD, carotid bruits, or masses noted.  Cardiac: Regular rate and rhythm. No murmurs, rubs, or gallops. No edema.  Respiratory:  Lungs are clear to auscultation bilaterally with normal work of breathing.  GI: Soft and nontender.  MS: No deformity or atrophy. Gait and ROM intact. Skin: Warm and  dry. Color is normal.  Neuro:  Strength and sensation are intact and no gross focal deficits noted.  Psych: Alert, appropriate and with normal affect.   LABORATORY DATA:  EKG:  EKG is not ordered today.   Lab Results  Component Value Date   WBC 8.1 04/27/2014   HGB 12.7 04/27/2014   HCT 37.0 04/27/2014   PLT 273.0 04/27/2014   GLUCOSE 100* 05/07/2014   ALT 18 10/20/2011   AST 23 10/20/2011   NA 131* 05/07/2014   K 3.5 05/07/2014   CL 93* 05/07/2014   CREATININE 1.02 05/07/2014   BUN 9 05/07/2014   CO2 27 05/07/2014   TSH 0.41 09/28/2012   INR 0.9 04/27/2014    BNP (last 3 results) No results for input(s): BNP in the last 8760 hours.  ProBNP (last 3 results) No results for input(s): PROBNP in the last 8760 hours.   Other Studies Reviewed Today: Cardiac Catheterization Operative Report  Procedure Performed:  1. Left Heart Catheterization 2. Selective Coronary Angiography 3. Left ventricular angiogram  Indication:73 yo female with history of CAD, HTN, HLD, anxiety, Barrett's esophagus who is here today for cardiac cath. I saw her as a new patient 10/14/11 for evaluation of chest pain. She reported tightness and pains in the center of her chest with exertion, dyspnea and fatigue. She had been seen by Dr. Chase Caller prior her first visit her and her CT chest showed calcifications in the left main and all three major coronary arteries. I arranged a cardiac cath on 10/16/11. She was found to have a moderate 70% stenosis in the first obtuse marginal branch and a 90% stenosis in the mid LAD. FFR of the obtuse marginal stenosis was 0.98 and following infusion of adenosine was 0.89 suggesting this stenosis was not flow limiting. I then placed overlapping stents in the mid LAD as this was felt to be the culprit lesion. (2.5 x 24 mm Promus Element with more proximal overlapping 2.75 x 8 mm Promus Element DES). She did well following the procedure. She had recurrence  of chest pain and was  seen by Truitt Merle, NP on 02/25/12 and had c/o worsened dypsnea. I arranged a repeat cath on 03/07/12 and a 2.75 x 12 mm Promus Premier DES was placed in the OM branch. She has continued to have fatigue, dyspnea. Stress myoview 12/08/12 with no ischemia but she did have 1 mm ST segment depression with exercise. Based on her symptoms and known disease, cath was repeated on 12/14/12 and here LAD and OM stents were patent. There was mild disease noted in the distal left main and ostial LAD. IVUS imaging confirmed that this plaque disease was mild in the left main and LAD. She was seen in our office last week by Truitt Merle, NP with c/o fatigue and cardiac cath was arranged as this was felt to be a possible anginal equivalent.   Angiographic Findings:  Left main: The distal vessel has 30% stenosis. This was closely examined in multiple views and did not appear to significant.   Left Anterior Descending Artery: Moderate sized vessel that courses to the apex. The ostium has a 30-40% stenosis. The proximal vessel has diffuse plaque. There are patent overlapping stents in the mid segment with 30% restenosis in the distal stented segment (unchanged from last cath). There is calcification in the proximal and mid vessel. The diagonal branch is very small.   Circumflex Artery: Moderate sized vessel with moderate sized obtuse marginal branch. The first obtuse marginal branch has 30% proximal stenosis and a patent stent in the mid segment with no restenosis. The AV groove Circumflex is small in caliber beyond the takeoff of the marginal branch. Moderate caliber intermediate branch with mild diffuse plaque.   Right Coronary Artery: Moderate sized dominant vessel with ostial 20% stenosis, diffuse 30% stenosis in the proximal and mid vessel. No flow limiting lesions noted.   Left Ventricular Angiogram: LVEF=65-70%.   Impression: 1. Stable double vessel CAD with patent stents mid  LAD and obtuse marginal 2. Mild disease distal left main and ostial LAD (unchanged angiographically from last cath. IVUS 2014 demonstrated mild plaque in the distal left main and ostial LAD).  3. Normal LV systolic function  Recommendations: Continue medical management of CAD.    Complications: None. The patient tolerated the procedure well.      Assessment/Plan:  1 . CAD: s/p recent cath with stable double vessel CAD noted with patent stents in the mid LAD and OM. Would continue with medical management. She will continue with her current regimen. She is getting her thyroid worked up. We will see her back in 4 months.   2. HTN: BP well controlled. No changes.   3. HLD: Continue statin.   4. Past tobacco use - remains abstained.   Current medicines are reviewed with the patient today.  The patient does not have concerns regarding medicines other than what has been noted above.  The following changes have been made:  See above.  Labs/ tests ordered today include:   No orders of the defined types were placed in this encounter.     Disposition:   FU with Dr. Angelena Form in 6 months.   Patient is agreeable to this plan and will call if any problems develop in the interim.   Signed: Burtis Junes, RN, ANP-C 05/29/2014 2:06 PM  [place] [address] [place], [place]  [address] Phone: [phone] Fax: [phone]

## 2014-06-11 ENCOUNTER — Encounter: Payer: Self-pay | Admitting: Cardiovascular Disease

## 2014-06-12 ENCOUNTER — Other Ambulatory Visit: Payer: Self-pay | Admitting: *Deleted

## 2014-06-12 DIAGNOSIS — I2510 Atherosclerotic heart disease of native coronary artery without angina pectoris: Secondary | ICD-10-CM

## 2014-06-12 MED ORDER — CLOPIDOGREL BISULFATE 75 MG PO TABS: ORAL_TABLET | ORAL | Status: DC

## 2014-06-12 MED ORDER — PANTOPRAZOLE SODIUM 40 MG PO TBEC: DELAYED_RELEASE_TABLET | ORAL | Status: DC

## 2014-06-13 ENCOUNTER — Other Ambulatory Visit: Payer: Self-pay | Admitting: *Deleted

## 2014-06-13 ENCOUNTER — Encounter: Payer: Self-pay | Admitting: Cardiovascular Disease

## 2014-06-13 ENCOUNTER — Telehealth: Payer: Self-pay | Admitting: *Deleted

## 2014-06-13 ENCOUNTER — Encounter: Payer: Self-pay | Admitting: Nurse Practitioner

## 2014-06-13 MED ORDER — RANOLAZINE ER 500 MG PO TB12: 500.0000 mg | ORAL_TABLET | Freq: Two times a day (BID) | ORAL | Status: DC

## 2014-06-13 NOTE — Telephone Encounter (Signed)
Pt is willing to start Ranexa ( 500 mg ) bid and will pick up tomorrow.  Pt has a month supply will notify if medication is helping and will continue or not in two weeks.

## 2014-06-13 NOTE — Telephone Encounter (Signed)
Leaving Ranexa ( 500 mg ) samples up front for pick up.

## 2014-06-26 ENCOUNTER — Encounter: Payer: Self-pay | Admitting: Gastroenterology

## 2014-06-27 ENCOUNTER — Encounter: Payer: Self-pay | Admitting: Nurse Practitioner

## 2014-07-09 ENCOUNTER — Encounter: Payer: Self-pay | Admitting: Nurse Practitioner

## 2014-07-17 ENCOUNTER — Other Ambulatory Visit: Payer: Self-pay

## 2014-07-17 MED ORDER — RANOLAZINE ER 500 MG PO TB12: 500.0000 mg | ORAL_TABLET | Freq: Two times a day (BID) | ORAL | Status: DC

## 2014-08-28 ENCOUNTER — Encounter: Payer: Self-pay | Admitting: Nurse Practitioner

## 2014-09-13 ENCOUNTER — Encounter: Payer: Self-pay | Admitting: Cardiovascular Disease

## 2014-09-14 ENCOUNTER — Encounter: Payer: Self-pay | Admitting: *Deleted

## 2014-09-14 NOTE — Telephone Encounter (Signed)
I spoke with Malachy Mood and gave her information from Dr. Angelena Form. Fax number for office is [phone].  Letter faxed.

## 2014-09-15 ENCOUNTER — Encounter: Payer: Self-pay | Admitting: Cardiovascular Disease

## 2014-09-28 ENCOUNTER — Encounter: Payer: Self-pay | Admitting: Cardiovascular Disease

## 2014-09-28 ENCOUNTER — Ambulatory Visit (INDEPENDENT_AMBULATORY_CARE_PROVIDER_SITE_OTHER): Payer: Medicare Other | Admitting: Cardiovascular Disease

## 2014-09-28 VITALS — BP 122/52 | HR 69 | Ht 61.5 in | Wt 126.8 lb

## 2014-09-28 DIAGNOSIS — I200 Unstable angina: Secondary | ICD-10-CM | POA: Diagnosis not present

## 2014-09-28 DIAGNOSIS — I10 Essential (primary) hypertension: Secondary | ICD-10-CM

## 2014-09-28 DIAGNOSIS — I2510 Atherosclerotic heart disease of native coronary artery without angina pectoris: Secondary | ICD-10-CM

## 2014-09-28 DIAGNOSIS — E785 Hyperlipidemia, unspecified: Secondary | ICD-10-CM

## 2014-09-28 NOTE — Patient Instructions (Signed)

## 2014-09-28 NOTE — Progress Notes (Signed)
Chief Complaint  Patient presents with  . Follow-up      History of Present Illness: 74 yo female with history of CAD, HTN, HLD, anxiety, Barrett's esophagus who is here today for cardiac follow up. I saw her as a new patient 10/14/11 for evaluation of chest pain. She had been seen by Dr. Chase Caller prior to her first visit here and her CT chest showed calcifications in the left main and all three major coronary arteries. I arranged a cardiac cath on 10/16/11. She was found to have a moderate 70% stenosis in the first obtuse marginal branch and a 90% stenosis in the mid LAD. FFR of the obtuse marginal stenosis was 0.98 and following infusion of adenosine was 0.89 suggesting this stenosis was not flow limiting. I could not navigate the flow wire into the LAD. This stenosis appeared critical. I then placed overlapping stents in the mid LAD. (2.5 x 24 mm Promus Element with more proximal overlapping 2.75 x 8 mm Promus Element DES). She did well following the procedure. She had recurrence of chest pain and was seen by Truitt Merle, NP on 02/25/12 and had c/o worsened dypsnea. I arranged a repeat cath on 03/07/12 and a 2.75 x 12 mm Promus Premier DES was placed in the OM branch. I saw her on 11/30/12 and she had c/o fatigue and SOB. Stress myoview without ischemia on images but she did have EKG changes suggestive of ischemia during exercise. Cardiac cath 12/14/12 with patent stents, moderate disease. IVUS of left main and proximal LAD confirmed mild plaque disease. Repeat cath April 2016 with stable CAD.   She is here today for follow up. No chest pain, SOB or fatigue. No LE edema. She has retired.   Primary Care Physician: Netta Cedars (retired but no new Dr. Loreli Dollar)  Last Lipid Profile: Followed in primary care.   Past Medical History  Diagnosis Date  . Hyperlipidemia   . HTN (hypertension)   . Anxiety   . Diverticulosis   . Barrett's esophagus without dysplasia   . Rectocele   . CAD (coronary  artery disease)     a. LHC 10/16/11: dLM 20%, mLAD 90%, pOM1 30%, mOM1 70% (FFR not hemodynamically significant), prox and mid RCA 30%, EF 65%;  b. PCI 10/16/11: Promus DES x 2 to mLAD  . COPD (chronic obstructive pulmonary disease)     mild  . Shortness of breath     before caediac stent  . Depression   . GERD (gastroesophageal reflux disease)   . Exertional angina 03/08/2012    Past Surgical History  Procedure Laterality Date  . Partial hysterectomy  1980s  . Colonoscopy  01/2008    multiple diverticula in desc and sigm colon  . Esophagogastroduodenoscopy  01/2008    Barrett's esophagus, gastritis, no h.pylori, due follow-up 01/2011  . Flexible sigmoidoscopy  08/11/2010 RECTAL PAIN/PRESSURE    SML IH, TICS  . Esophagogastroduodenoscopy  04/06/2011    Procedure: ESOPHAGOGASTRODUODENOSCOPY (EGD);  Surgeon: Danie Binder, MD;  [facility];  Service: Endoscopy;  Laterality: N/A;  8:30  . Abdominal hysterectomy    . Cardiac catheterization    . Fractional flow reserve wire N/A 10/16/2011    Procedure: FRACTIONAL FLOW RESERVE WIRE;  Surgeon: [name], MD;  Location: [facility] CATH LAB;  Service: Cardiovascular;  Laterality: N/A;  . Left heart catheterization with coronary angiogram N/A 03/07/2012    Procedure: LEFT HEART CATHETERIZATION WITH CORONARY ANGIOGRAM;  Surgeon: [name], MD;  Location: [facility] CATH LAB;  Service: Cardiovascular;  Laterality: N/A;  . Left heart catheterization with coronary angiogram N/A 12/14/2012    Procedure: LEFT HEART CATHETERIZATION WITH CORONARY ANGIOGRAM;  Surgeon: [name], MD;  Location: [facility] CATH LAB;  Service: Cardiovascular;  Laterality: N/A;  . Left heart catheterization with coronary angiogram N/A 05/07/2014    Procedure: LEFT HEART CATHETERIZATION WITH CORONARY ANGIOGRAM;  Surgeon: [name], MD;  Location: [facility] CATH LAB;  Service: Cardiovascular;  Laterality: N/A;    Current Outpatient Prescriptions    Medication Sig Dispense Refill  . ALPRAZolam (XANAX) 0.25 MG tablet Take 0.25 mg by mouth daily as needed. For anxiety    . aspirin 81 MG chewable tablet Chew 81 mg by mouth daily.    Marland Kitchen atorvastatin (LIPITOR) 10 MG tablet Take 10 mg by mouth daily.     . Biotin 5000 MCG CAPS Take 1 capsule by mouth 2 (two) times daily.     . Calcium Carbonate-Vitamin D (CALCIUM + D) 600-200 MG-UNIT TABS Take 1 tablet by mouth 2 (two) times daily.     . Cholecalciferol (VITAMIN D-3) 1000 UNITS CAPS Take 1 capsule by mouth daily.     . clopidogrel (PLAVIX) 75 MG tablet Take 1 tablet (75 mg total) by mouth daily with  breakfast. 90 tablet 3  . diphenhydramine-acetaminophen (TYLENOL PM) 25-500 MG TABS Take 2 tablets by mouth at bedtime as needed (Sleep).    . docusate sodium (COLACE) 100 MG capsule Take 100-200 mg by mouth at bedtime.    . isosorbide mononitrate (IMDUR) 30 MG 24 hr tablet Take 30 mg by mouth daily.     Marland Kitchen lisinopril-hydrochlorothiazide (PRINZIDE,ZESTORETIC) 20-12.5 MG per tablet Take 1 tablet by mouth at bedtime.    . niacin 500 MG tablet Take 500 mg by mouth at bedtime.    . pantoprazole (PROTONIX) 40 MG tablet Take 1 tablet by mouth  daily 90 tablet 3  . Polyvinyl Alcohol-Povidone (REFRESH OP) Apply 2 drops to eye daily as needed (Scratchy eyes).    . potassium chloride SA (K-DUR,KLOR-CON) 20 MEQ tablet Take 40 mEq by mouth every morning.    . venlafaxine XR (EFFEXOR-XR) 37.5 MG 24 hr capsule Take 37.5 mg by mouth at bedtime.    . vitamin B-12 (CYANOCOBALAMIN) 1000 MCG tablet Take 2,000 mcg by mouth daily.    . [DISCONTINUED] niacin (NIASPAN) 500 MG CR tablet Take 500 mg by mouth at bedtime.       No current facility-administered medications for this visit.    Allergies  Allergen Reactions  . Adhesive [Tape] Other (See Comments)    Turns skin red  . Sulfa Antibiotics Other (See Comments)    Childhood allergy    Social History   Social History  . Marital Status: Married    Spouse  Name: N/A  . Number of Children: 1  . Years of Education: N/A   Occupational History  . 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     Social History Main Topics  . Smoking status: Former Smoker -- 1.00 packs/day for 53 years    Types: Cigarettes    Quit date: 03/20/2007  . Smokeless tobacco: Never Used  . Alcohol Use: No  . Drug Use: No  . Sexual Activity: Yes   Other Topics Concern  . Not on file   Social History Narrative    Family History  Problem Relation Age of Onset  . Coronary artery disease Father 25  . Heart  attack Father 24  . Colon cancer Neg Hx   . GI problems Mother     perforated colon   . Coronary artery disease Paternal Aunt   . Coronary artery disease Paternal Uncle     Review of Systems:  As stated in the HPI and otherwise negative.   BP 122/52 mmHg  Pulse 69  Ht 5' 1.5" (1.562 m)  Wt 126 lb 12.8 oz (57.516 kg)  BMI 23.57 kg/m2  SpO2 98%  Physical Examination: General: Well developed, well nourished, NAD HEENT: OP clear, mucus membranes moist SKIN: warm, dry. No rashes. Neuro: No focal deficits Musculoskeletal: Muscle strength 5/5 all ext Psychiatric: Mood and affect normal Neck: No JVD, no carotid bruits, no thyromegaly, no lymphadenopathy. Lungs:Clear bilaterally, no wheezes, rhonci, crackles Cardiovascular: Regular rate and rhythm. No murmurs, gallops or rubs. Abdomen:Soft. Bowel sounds present. Non-tender.  Extremities: No lower extremity edema. Pulses are 2 + in the bilateral DP/PT.  Cardiac Cath April 2016: Left main: The distal vessel has 30% stenosis. This was closely examined in multiple views and did not appear to significant.  Left Anterior Descending Artery: Moderate sized vessel that courses to the apex. The ostium has a 30-40% stenosis. The proximal vessel has diffuse plaque. There are patent overlapping stents in the mid segment with 30% restenosis in the distal stented segment (unchanged from last cath). There is  calcification in the proximal and mid vessel. The diagonal branch is very small.  Circumflex Artery: Moderate sized vessel with moderate sized obtuse marginal branch. The first obtuse marginal branch has 30% proximal stenosis and a patent stent in the mid segment with no restenosis. The AV groove Circumflex is small in caliber beyond the takeoff of the marginal branch. Moderate caliber intermediate branch with mild diffuse plaque.  Right Coronary Artery: Moderate sized dominant vessel with ostial 20% stenosis, diffuse 30% stenosis in the proximal and mid vessel. No flow limiting lesions noted.  Left Ventricular Angiogram: LVEF=65-70%.   EKG:  EKG is not ordered today. The ekg ordered today demonstrates   Recent Labs: 04/27/2014: Hemoglobin 12.7; Platelets 273.0 05/07/2014: BUN 9; Creatinine, Ser 1.02; Potassium 3.5; Sodium 131*   Lipid Panel No results found for: CHOL, TRIG, HDL, CHOLHDL, VLDL, LDLCALC, LDLDIRECT   Wt Readings from Last 3 Encounters:  09/28/14 126 lb 12.8 oz (57.516 kg)  05/29/14 128 lb 6.4 oz (58.242 kg)  05/07/14 128 lb (58.06 kg)     Other studies Reviewed: Additional studies/ records that were reviewed today include: . Review of the above records demonstrates:    Assessment and Plan:   1. CAD: Stable. Continue current meds including ASA/Plavix. No beta blocker secondary to bradycardia.   2. HTN: BP well controlled. No changes.   3. HLD: Continue statin. Lipids followed in primary care.   Current medicines are reviewed at length with the patient today.  The patient does not have concerns regarding medicines.  The following changes have been made:  no change  Labs/ tests ordered today include:  No orders of the defined types were placed in this encounter.     Disposition:   FU with me in 6 months   Signed, Lauree Chandler, MD 09/28/2014 5:09 PM    [facility] [place], [place], [place]  [address] Phone: [phone];  Fax: [phone]

## 2015-01-11 ENCOUNTER — Encounter (HOSPITAL_COMMUNITY): Payer: Self-pay | Admitting: Emergency Medicine

## 2015-01-11 ENCOUNTER — Emergency Department (HOSPITAL_COMMUNITY)
Admission: EM | Admit: 2015-01-11 | Discharge: 2015-01-11 | Disposition: A | Discharge status: Home / Self Care | Payer: Medicare Other | Attending: Emergency Medicine | Admitting: Emergency Medicine

## 2015-01-11 DIAGNOSIS — I10 Essential (primary) hypertension: Secondary | ICD-10-CM | POA: Insufficient documentation

## 2015-01-11 DIAGNOSIS — F419 Anxiety disorder, unspecified: Secondary | ICD-10-CM | POA: Insufficient documentation

## 2015-01-11 DIAGNOSIS — Z9889 Other specified postprocedural states: Secondary | ICD-10-CM | POA: Insufficient documentation

## 2015-01-11 DIAGNOSIS — E785 Hyperlipidemia, unspecified: Secondary | ICD-10-CM | POA: Diagnosis not present

## 2015-01-11 DIAGNOSIS — K219 Gastro-esophageal reflux disease without esophagitis: Secondary | ICD-10-CM | POA: Diagnosis not present

## 2015-01-11 DIAGNOSIS — Z7902 Long term (current) use of antithrombotics/antiplatelets: Secondary | ICD-10-CM | POA: Insufficient documentation

## 2015-01-11 DIAGNOSIS — Z87891 Personal history of nicotine dependence: Secondary | ICD-10-CM | POA: Insufficient documentation

## 2015-01-11 DIAGNOSIS — M25512 Pain in left shoulder: Secondary | ICD-10-CM | POA: Diagnosis not present

## 2015-01-11 DIAGNOSIS — F329 Major depressive disorder, single episode, unspecified: Secondary | ICD-10-CM | POA: Diagnosis not present

## 2015-01-11 DIAGNOSIS — Z79899 Other long term (current) drug therapy: Secondary | ICD-10-CM | POA: Diagnosis not present

## 2015-01-11 DIAGNOSIS — I25729 Atherosclerosis of autologous artery coronary artery bypass graft(s) with unspecified angina pectoris: Secondary | ICD-10-CM | POA: Insufficient documentation

## 2015-01-11 DIAGNOSIS — J449 Chronic obstructive pulmonary disease, unspecified: Secondary | ICD-10-CM | POA: Diagnosis not present

## 2015-01-11 DIAGNOSIS — Z7982 Long term (current) use of aspirin: Secondary | ICD-10-CM | POA: Diagnosis not present

## 2015-01-11 MED ORDER — DEXAMETHASONE SODIUM PHOSPHATE 4 MG/ML IJ SOLN
8.0000 mg | Freq: Once | INTRAMUSCULAR | Status: AC
  Administered 2015-01-11: 8 mg via INTRAMUSCULAR
  Filled 2015-01-11: qty 2

## 2015-01-11 MED ORDER — HYDROCODONE-ACETAMINOPHEN 5-325 MG PO TABS
1.0000 | ORAL_TABLET | Freq: Once | ORAL | Status: AC
  Administered 2015-01-11: 1 via ORAL
  Filled 2015-01-11: qty 1

## 2015-01-11 MED ORDER — HYDROCODONE-ACETAMINOPHEN 5-325 MG PO TABS
1.0000 | ORAL_TABLET | ORAL | Status: DC | PRN
Start: 2015-01-11 — End: 2015-04-25

## 2015-01-11 MED ORDER — ONDANSETRON HCL 4 MG PO TABS
4.0000 mg | ORAL_TABLET | Freq: Once | ORAL | Status: AC
  Administered 2015-01-11: 4 mg via ORAL
  Filled 2015-01-11: qty 1

## 2015-01-11 NOTE — ED Notes (Signed)
Pt seen in [place] for left scapular pain radiating to axilla one week ago.  Pt was given steroids and muscle relaxer with no relief. Pt denies cp.

## 2015-01-11 NOTE — Discharge Instructions (Signed)
Please use a heating pad to your shoulder when possible. Please use Tylenol for mild pain, use Norco for more severe pain. Norco may cause drowsiness, and/or lightheadedness. Please use this medication with caution. Please make your primary care provider aware of this prescription so that it may be made a part of your medical records. Please see your orthopedic specialist as sone as possible. Shoulder Pain The shoulder is the joint that connects your arm to your body. Muscles and band-like tissues that connect bones to muscles (tendons) hold the joint together. Shoulder pain is felt if an injury or medical problem affects one or more parts of the shoulder. HOME CARE   Put ice on the sore area.  Put ice in a plastic bag.  Place a towel between your skin and the bag.  Leave the ice on for 15-20 minutes, 03-04 times a day for the first 2 days.  Stop using cold packs if they do not help with the pain.  If you were given something to keep your shoulder from moving (sling; shoulder immobilizer), wear it as told. Only take it off to shower or bathe.  Move your arm as little as possible, but keep your hand moving to prevent puffiness (swelling).  Squeeze a soft ball or foam pad as much as possible to help prevent swelling.  Take medicine as told by your doctor. GET HELP IF:  You have progressing new pain in your arm, hand, or fingers.  Your hand or fingers get cold.  Your medicine does not help lessen your pain. GET HELP RIGHT AWAY IF:   Your arm, hand, or fingers are numb or tingling.  Your arm, hand, or fingers are puffy (swollen), painful, or turn white or blue. MAKE SURE YOU:   Understand these instructions.  Will watch your condition.  Will get help right away if you are not doing well or get worse.   This information is not intended to replace advice given to you by your health care provider. Make sure you discuss any questions you have with your health care provider.     Document Released: 06/24/2007 Document Revised: 01/26/2014 Document Reviewed: 04/30/2014 Elsevier Interactive Patient Education [employer].

## 2015-01-11 NOTE — ED Provider Notes (Signed)
CSN: [id number]     Arrival date & time 01/11/15  [id number] History   First MD Initiated Contact with Patient 01/11/15 [phone]     Chief Complaint  Patient presents with  . Shoulder Pain     (Consider location/radiation/quality/duration/timing/severity/associated sxs/prior Treatment) HPI Comments: Patient is a 74 year old female who presents to the emergency department with a complaint of left shoulder and scapular area pain.  The patient states that approximately a week ago she developed this discomfort. She denies any recent injury. She does not recall any excessive lifting, pushing, or pulling. She was evaluated by her physician in [place] and was treated with steroids and muscle relaxers. She states however that she is getting no relief. This morning she states she felt bad because of the increasing pain in the right shoulder. She has a history of coronary artery disease, and was concerned of the severity of the left shoulder pain. She felt that she should have this checked to make sure that it was not her heart, and to seek additional information. Patient denies any loss of consciousness, sweats, nausea vomiting, or chest pain. His been no tingling in the left hand, and no loss of use of the left upper extremity.  The history is provided by the patient.    Past Medical History  Diagnosis Date  . Hyperlipidemia   . HTN (hypertension)   . Anxiety   . Diverticulosis   . Barrett's esophagus without dysplasia   . Rectocele   . CAD (coronary artery disease)     a. LHC 10/16/11: dLM 20%, mLAD 90%, pOM1 30%, mOM1 70% (FFR not hemodynamically significant), prox and mid RCA 30%, EF 65%;  b. PCI 10/16/11: Promus DES x 2 to mLAD  . COPD (chronic obstructive pulmonary disease) (HCC)     mild  . Shortness of breath     before caediac stent  . Depression   . GERD (gastroesophageal reflux disease)   . Exertional angina ([place]) 03/08/2012   Past Surgical History  Procedure Laterality Date  .  Partial hysterectomy  1980s  . Colonoscopy  01/2008    multiple diverticula in desc and sigm colon  . Esophagogastroduodenoscopy  01/2008    Barrett's esophagus, gastritis, no h.pylori, due follow-up 01/2011  . Flexible sigmoidoscopy  08/11/2010 RECTAL PAIN/PRESSURE    SML IH, TICS  . Esophagogastroduodenoscopy  04/06/2011    Procedure: ESOPHAGOGASTRODUODENOSCOPY (EGD);  Surgeon: Danie Binder, MD;  Location: [facility];  Service: Endoscopy;  Laterality: N/A;  8:30  . Abdominal hysterectomy    . Cardiac catheterization    . Fractional flow reserve wire N/A 10/16/2011    Procedure: FRACTIONAL FLOW RESERVE WIRE;  Surgeon: [name], MD;  Location: [facility] CATH LAB;  Service: Cardiovascular;  Laterality: N/A;  . Left heart catheterization with coronary angiogram N/A 03/07/2012    Procedure: LEFT HEART CATHETERIZATION WITH CORONARY ANGIOGRAM;  Surgeon: [name], MD;  Location: [facility] CATH LAB;  Service: Cardiovascular;  Laterality: N/A;  . Left heart catheterization with coronary angiogram N/A 12/14/2012    Procedure: LEFT HEART CATHETERIZATION WITH CORONARY ANGIOGRAM;  Surgeon: [name], MD;  Location: [facility] CATH LAB;  Service: Cardiovascular;  Laterality: N/A;  . Left heart catheterization with coronary angiogram N/A 05/07/2014    Procedure: LEFT HEART CATHETERIZATION WITH CORONARY ANGIOGRAM;  Surgeon: [name], MD;  [facility]: [facility] CATH LAB;  Service: Cardiovascular;  Laterality: N/A;   Family History  Problem Relation Age of Onset  . Coronary artery  disease Father 48  . Heart attack Father 34  . Colon cancer Neg Hx   . GI problems Mother     perforated colon   . Coronary artery disease Paternal Aunt   . Coronary artery disease Paternal Uncle    Social History  Substance Use Topics  . Smoking status: Former Smoker -- 1.00 packs/day for 53 years    Types: Cigarettes    Quit date: 03/20/2007  . Smokeless tobacco: Never Used  . Alcohol Use: No    OB History    No data available     Review of Systems  Musculoskeletal: Positive for arthralgias.  Psychiatric/Behavioral: The patient is nervous/anxious.   All other systems reviewed and are negative.     Allergies  Adhesive and Sulfa antibiotics  Home Medications   Prior to Admission medications   Medication Sig Start Date End Date Taking? Authorizing Provider  ALPRAZolam (XANAX) 0.25 MG tablet Take 0.25 mg by mouth daily as needed. For anxiety    Historical Provider, MD  aspirin 81 MG chewable tablet Chew 81 mg by mouth daily.    Historical Provider, MD  atorvastatin (LIPITOR) 10 MG tablet Take 10 mg by mouth daily.     Historical Provider, MD  Biotin 5000 MCG CAPS Take 1 capsule by mouth 2 (two) times daily.     Historical Provider, MD  Calcium Carbonate-Vitamin D (CALCIUM + D) 600-200 MG-UNIT TABS Take 1 tablet by mouth 2 (two) times daily.     Historical Provider, MD  Cholecalciferol (VITAMIN D-3) 1000 UNITS CAPS Take 1 capsule by mouth daily.     Historical Provider, MD  clopidogrel (PLAVIX) 75 MG tablet Take 1 tablet (75 mg total) by mouth daily with  breakfast. 06/12/14   [name], MD  diphenhydramine-acetaminophen (TYLENOL PM) 25-500 MG TABS Take 2 tablets by mouth at bedtime as needed (Sleep).    Historical Provider, MD  docusate sodium (COLACE) 100 MG capsule Take 100-200 mg by mouth at bedtime.    Historical Provider, MD  isosorbide mononitrate (IMDUR) 30 MG 24 hr tablet Take 30 mg by mouth daily.  03/13/14   Historical Provider, MD  lisinopril-hydrochlorothiazide (PRINZIDE,ZESTORETIC) 20-12.5 MG per tablet Take 1 tablet by mouth at bedtime. 03/08/12   Rhonda G Barrett, PA-C  niacin 500 MG tablet Take 500 mg by mouth at bedtime.    Historical Provider, MD  pantoprazole (PROTONIX) 40 MG tablet Take 1 tablet by mouth  daily 06/12/14   [name], MD  Polyvinyl Alcohol-Povidone (REFRESH OP) Apply 2 drops to eye daily as needed (Scratchy eyes).     Historical Provider, MD  potassium chloride SA (K-DUR,KLOR-CON) 20 MEQ tablet Take 40 mEq by mouth every morning.    Historical Provider, MD  venlafaxine XR (EFFEXOR-XR) 37.5 MG 24 hr capsule Take 37.5 mg by mouth at bedtime.    Historical Provider, MD  vitamin B-12 (CYANOCOBALAMIN) 1000 MCG tablet Take 2,000 mcg by mouth daily.    Historical Provider, MD   BP 115/83 mmHg  Pulse 80  Temp(Src) 97.7 F (36.5 C) (Oral)  Resp 16  Ht '5\' 1"'$  (1.549 m)  Wt 58.968 kg  BMI 24.58 kg/m2  SpO2 96% Physical Exam  Constitutional: She is oriented to person, place, and time. She appears well-developed and well-nourished.  Non-toxic appearance.  HENT:  Head: Normocephalic.  Right Ear: Tympanic membrane and external ear normal.  Left Ear: Tympanic membrane and external ear normal.  Eyes: EOM and lids are normal. Pupils are  equal, round, and reactive to light.  Neck: Normal range of motion. Neck supple. Carotid bruit is not present.  No carotid bruit noted with stethoscope.  Cardiovascular: Normal rate, regular rhythm, normal heart sounds, intact distal pulses and normal pulses.   Pulmonary/Chest: Breath sounds normal. No respiratory distress.  Abdominal: Soft. Bowel sounds are normal. There is no tenderness. There is no guarding.  Musculoskeletal:       Left shoulder: She exhibits decreased range of motion, tenderness, crepitus and pain. She exhibits no swelling, no effusion, no deformity, normal pulse and normal strength.  Lymphadenopathy:       Head (right side): No submandibular adenopathy present.       Head (left side): No submandibular adenopathy present.    She has no cervical adenopathy.  Neurological: She is alert and oriented to person, place, and time. She has normal strength. No cranial nerve deficit or sensory deficit.  Skin: Skin is warm and dry.  Psychiatric: She has a normal mood and affect. Her speech is normal.  Nursing note and vitals reviewed.   ED Course Patient seen with me  by Dr. Dayna Barker   Procedures (including critical care time) Labs Review Labs Reviewed - No data to display  Imaging Review No results found. I have personally reviewed and evaluated these images and lab results as part of my medical decision-making.   EKG Interpretation None      MDM  Electrocardiogram shows a normal sinus rhythm, without high degree block and without any evidence of ST elevation MI. The examination favors degenerative changes, and possibly some muscle strain of the posterior shoulder.  The patient is currently on Plavix. Will avoid anti-inflammatory medication at this time. Patient is given an injection of Decadron. And started on a prescription of Norco every 4-6 hours for severe pain. The patient is advised to see her orthopedic specialist after the holiday for orthopedic evaluation and management of this discomfort. I discussed this discharge plan with the patient in terms which he understands, and she is in agreement.    Final diagnoses:  None    **I have reviewed nursing notes, vital signs, and all appropriate lab and imaging results for this patient.    Lily Kocher, PA-C 01/11/15 [place], MD 01/11/15 1600

## 2015-02-14 ENCOUNTER — Encounter: Payer: Self-pay | Admitting: Cardiovascular Disease

## 2015-02-14 ENCOUNTER — Telehealth: Payer: Self-pay | Admitting: Cardiovascular Disease

## 2015-02-14 NOTE — Telephone Encounter (Signed)
Fax number confirmed and letter faxed.

## 2015-02-14 NOTE — Telephone Encounter (Signed)
New message     Request for surgical clearance:  1. What type of surgery is being performed? Epidural steriod injection    2. When is this surgery scheduled? 2/24  3. Are there any medications that need to be held prior to surgery and how long? Stop plavix 7 prior   4. Name of physician performing surgery? Dr. Jacques Navy  / Dr. Barton Fanny   5. What is your office phone and fax number? [phone] /  fax [phone]

## 2015-02-14 NOTE — Telephone Encounter (Signed)
See my letter. Can we fax? cdm

## 2015-04-24 ENCOUNTER — Observation Stay (HOSPITAL_COMMUNITY)
Admission: EM | Admit: 2015-04-24 | Discharge: 2015-04-25 | Disposition: A | Discharge status: Home / Self Care | Payer: Medicare Other | Attending: Internal Medicine | Admitting: Internal Medicine

## 2015-04-24 ENCOUNTER — Emergency Department (HOSPITAL_COMMUNITY): Payer: Medicare Other

## 2015-04-24 ENCOUNTER — Encounter: Payer: Self-pay | Admitting: Cardiovascular Disease

## 2015-04-24 ENCOUNTER — Telehealth: Payer: Self-pay | Admitting: *Deleted

## 2015-04-24 ENCOUNTER — Encounter (HOSPITAL_COMMUNITY): Payer: Self-pay | Admitting: Emergency Medicine

## 2015-04-24 DIAGNOSIS — Z7902 Long term (current) use of antithrombotics/antiplatelets: Secondary | ICD-10-CM | POA: Insufficient documentation

## 2015-04-24 DIAGNOSIS — I208 Other forms of angina pectoris: Secondary | ICD-10-CM | POA: Diagnosis not present

## 2015-04-24 DIAGNOSIS — R079 Chest pain, unspecified: Secondary | ICD-10-CM | POA: Insufficient documentation

## 2015-04-24 DIAGNOSIS — E876 Hypokalemia: Secondary | ICD-10-CM | POA: Insufficient documentation

## 2015-04-24 DIAGNOSIS — F329 Major depressive disorder, single episode, unspecified: Secondary | ICD-10-CM | POA: Insufficient documentation

## 2015-04-24 DIAGNOSIS — Z87891 Personal history of nicotine dependence: Secondary | ICD-10-CM | POA: Diagnosis not present

## 2015-04-24 DIAGNOSIS — I2510 Atherosclerotic heart disease of native coronary artery without angina pectoris: Secondary | ICD-10-CM | POA: Diagnosis not present

## 2015-04-24 DIAGNOSIS — J449 Chronic obstructive pulmonary disease, unspecified: Secondary | ICD-10-CM | POA: Insufficient documentation

## 2015-04-24 DIAGNOSIS — Z955 Presence of coronary angioplasty implant and graft: Secondary | ICD-10-CM | POA: Insufficient documentation

## 2015-04-24 DIAGNOSIS — F419 Anxiety disorder, unspecified: Secondary | ICD-10-CM | POA: Diagnosis not present

## 2015-04-24 DIAGNOSIS — Z7982 Long term (current) use of aspirin: Secondary | ICD-10-CM | POA: Insufficient documentation

## 2015-04-24 DIAGNOSIS — K219 Gastro-esophageal reflux disease without esophagitis: Secondary | ICD-10-CM | POA: Insufficient documentation

## 2015-04-24 DIAGNOSIS — E785 Hyperlipidemia, unspecified: Secondary | ICD-10-CM | POA: Insufficient documentation

## 2015-04-24 DIAGNOSIS — E871 Hypo-osmolality and hyponatremia: Secondary | ICD-10-CM | POA: Diagnosis not present

## 2015-04-24 DIAGNOSIS — I209 Angina pectoris, unspecified: Secondary | ICD-10-CM | POA: Diagnosis not present

## 2015-04-24 DIAGNOSIS — Z79899 Other long term (current) drug therapy: Secondary | ICD-10-CM | POA: Diagnosis not present

## 2015-04-24 DIAGNOSIS — R0602 Shortness of breath: Secondary | ICD-10-CM | POA: Insufficient documentation

## 2015-04-24 DIAGNOSIS — I200 Unstable angina: Secondary | ICD-10-CM

## 2015-04-24 DIAGNOSIS — I10 Essential (primary) hypertension: Secondary | ICD-10-CM | POA: Diagnosis not present

## 2015-04-24 HISTORY — DX: Essential (primary) hypertension: I10

## 2015-04-24 LAB — COMPREHENSIVE METABOLIC PANEL
ALBUMIN: 4.5 g/dL (ref 3.5–5.0)
ALK PHOS: 77 U/L (ref 38–126)
ALT: 15 U/L (ref 14–54)
ANION GAP: 9 (ref 5–15)
AST: 20 U/L (ref 15–41)
BUN: 12 mg/dL (ref 6–20)
CALCIUM: 9.1 mg/dL (ref 8.9–10.3)
CHLORIDE: 92 mmol/L — AB (ref 101–111)
CO2: 27 mmol/L (ref 22–32)
Creatinine, Ser: 0.97 mg/dL (ref 0.44–1.00)
GFR calc Af Amer: >60 mL/min (ref >60)
GFR calc non Af Amer: 57 mL/min — ABNORMAL LOW (ref >60)
GLUCOSE: 97 mg/dL (ref 65–99)
Potassium: 4.1 mmol/L (ref 3.5–5.1)
SODIUM: 128 mmol/L — AB (ref 135–145)
Total Bilirubin: 0.7 mg/dL (ref 0.3–1.2)
Total Protein: 7.8 g/dL (ref 6.5–8.1)

## 2015-04-24 LAB — CBC
HCT: 37.3 % (ref 36.0–46.0)
HEMOGLOBIN: 12.8 g/dL (ref 12.0–15.0)
MCH: 32.1 pg (ref 26.0–34.0)
MCHC: 34.3 g/dL (ref 30.0–36.0)
MCV: 93.5 fL (ref 78.0–100.0)
PLATELETS: 288 10*3/uL (ref 150–400)
RBC: 3.99 MIL/uL (ref 3.87–5.11)
RDW: 12.3 % (ref 11.5–15.5)
WBC: 9.5 10*3/uL (ref 4.0–10.5)

## 2015-04-24 LAB — TSH: TSH: 0.467 u[IU]/mL (ref 0.350–4.500)

## 2015-04-24 LAB — TROPONIN I

## 2015-04-24 LAB — BRAIN NATRIURETIC PEPTIDE: B Natriuretic Peptide: 6 pg/mL (ref 0.0–100.0)

## 2015-04-24 MED ORDER — HEPARIN BOLUS VIA INFUSION
3000.0000 [IU] | Freq: Once | INTRAVENOUS | Status: AC
  Administered 2015-04-24: 3000 [IU] via INTRAVENOUS

## 2015-04-24 MED ORDER — ACETAMINOPHEN 325 MG PO TABS: 650.0000 mg | ORAL_TABLET | ORAL | Status: DC | PRN

## 2015-04-24 MED ORDER — CLOPIDOGREL BISULFATE 75 MG PO TABS
75.0000 mg | ORAL_TABLET | Freq: Every day | ORAL | Status: DC
  Filled 2015-04-24: qty 1

## 2015-04-24 MED ORDER — ASPIRIN 81 MG PO CHEW
162.0000 mg | CHEWABLE_TABLET | Freq: Once | ORAL | Status: AC
  Administered 2015-04-24: 162 mg via ORAL
  Filled 2015-04-24: qty 2

## 2015-04-24 MED ORDER — LISINOPRIL-HYDROCHLOROTHIAZIDE 20-12.5 MG PO TABS: 1.0000 | ORAL_TABLET | Freq: Every day | ORAL | Status: DC

## 2015-04-24 MED ORDER — VENLAFAXINE HCL ER 37.5 MG PO CP24
37.5000 mg | ORAL_CAPSULE | Freq: Every day | ORAL | Status: DC
  Administered 2015-04-25: 37.5 mg via ORAL
  Filled 2015-04-24: qty 1

## 2015-04-24 MED ORDER — PANTOPRAZOLE SODIUM 20 MG PO TBEC
20.0000 mg | DELAYED_RELEASE_TABLET | Freq: Every day | ORAL | Status: DC
  Filled 2015-04-24 (×2): qty 1

## 2015-04-24 MED ORDER — DOCUSATE SODIUM 100 MG PO CAPS
100.0000 mg | ORAL_CAPSULE | Freq: Every day | ORAL | Status: DC
Start: 2015-04-24 — End: 2015-04-25
  Filled 2015-04-24: qty 1

## 2015-04-24 MED ORDER — HEPARIN (PORCINE) IN NACL 100-0.45 UNIT/ML-% IJ SOLN
850.0000 [IU]/h | INTRAMUSCULAR | Status: DC
  Administered 2015-04-24: 850 [IU]/h via INTRAVENOUS
  Filled 2015-04-24: qty 250

## 2015-04-24 MED ORDER — ASPIRIN 81 MG PO CHEW
81.0000 mg | CHEWABLE_TABLET | Freq: Every day | ORAL | Status: DC
  Administered 2015-04-25: 81 mg via ORAL
  Filled 2015-04-24: qty 1

## 2015-04-24 MED ORDER — ATORVASTATIN CALCIUM 40 MG PO TABS
40.0000 mg | ORAL_TABLET | Freq: Every day | ORAL | Status: DC
  Administered 2015-04-25: 40 mg via ORAL
  Filled 2015-04-24: qty 1

## 2015-04-24 MED ORDER — POTASSIUM CHLORIDE CRYS ER 20 MEQ PO TBCR
40.0000 meq | EXTENDED_RELEASE_TABLET | Freq: Every morning | ORAL | Status: DC
  Administered 2015-04-25: 40 meq via ORAL
  Filled 2015-04-24: qty 2

## 2015-04-24 MED ORDER — LISINOPRIL 10 MG PO TABS
20.0000 mg | ORAL_TABLET | Freq: Every day | ORAL | Status: DC
  Filled 2015-04-24: qty 2

## 2015-04-24 MED ORDER — DIPHENHYDRAMINE-APAP (SLEEP) 25-500 MG PO TABS: 2.0000 | ORAL_TABLET | Freq: Every evening | ORAL | Status: DC | PRN

## 2015-04-24 MED ORDER — NITROGLYCERIN 0.4 MG SL SUBL: 0.4000 mg | SUBLINGUAL_TABLET | SUBLINGUAL | Status: DC | PRN

## 2015-04-24 MED ORDER — CARBOXYMETHYLCELLULOSE SODIUM 1 % OP SOLN: 2.0000 [drp] | Freq: Two times a day (BID) | OPHTHALMIC | Status: DC | PRN

## 2015-04-24 MED ORDER — ISOSORBIDE MONONITRATE ER 30 MG PO TB24
30.0000 mg | ORAL_TABLET | Freq: Every day | ORAL | Status: DC
  Administered 2015-04-25: 30 mg via ORAL
  Filled 2015-04-24: qty 1

## 2015-04-24 MED ORDER — VITAMIN B-12 1000 MCG PO TABS
2000.0000 ug | ORAL_TABLET | Freq: Every day | ORAL | Status: DC
  Administered 2015-04-25: 2000 ug via ORAL
  Filled 2015-04-24: qty 2

## 2015-04-24 MED ORDER — ALPRAZOLAM 0.25 MG PO TABS
0.2500 mg | ORAL_TABLET | Freq: Three times a day (TID) | ORAL | Status: DC | PRN
  Administered 2015-04-25: 0.25 mg via ORAL
  Filled 2015-04-24: qty 1

## 2015-04-24 MED ORDER — ONDANSETRON HCL 4 MG/2ML IJ SOLN: 4.0000 mg | Freq: Four times a day (QID) | INTRAMUSCULAR | Status: DC | PRN

## 2015-04-24 MED ORDER — CALCIUM CARBONATE-VITAMIN D 500-200 MG-UNIT PO TABS
1.0000 | ORAL_TABLET | Freq: Two times a day (BID) | ORAL | Status: DC
  Administered 2015-04-25: 1 via ORAL
  Filled 2015-04-24 (×5): qty 1

## 2015-04-24 NOTE — ED Notes (Signed)
Pt was outside work, got SOB, chest tightness, walking up incline to get to house made it worse

## 2015-04-24 NOTE — ED Provider Notes (Signed)
CSN: [id number]     Arrival date & time 04/24/15  1552 History   First MD Initiated Contact with Patient 04/24/15 1711     Chief Complaint  Patient presents with  . Chest Pain     (Consider location/radiation/quality/duration/timing/severity/associated sxs/prior Treatment) HPI  75 year old female who presents with chest pain and shortness of breath. History of CAD with prior stenting and mild COPD. States that today was doing yard work and going up an incline in her backyard when she began to feel shortness of breath and chest tightness. States that she has been having similar symptoms with more exertional daily activities such as walking for 15 minutes. States that she has been feeling this way for the past few weeks, and is not had issues with exertion in the past. States that this is similar presentation 20 had stenting performed in the past. Currently without chest pain or shortness of breath. No cough, phlegm, fevers or chills. No lower extremity edema or pain. No orthopnea or PND. Past Medical History  Diagnosis Date  . Hyperlipidemia   . HTN (hypertension)   . Anxiety   . Diverticulosis   . Barrett's esophagus without dysplasia   . Rectocele   . CAD (coronary artery disease)     a. LHC 10/16/11: dLM 20%, mLAD 90%, pOM1 30%, mOM1 70% (FFR not hemodynamically significant), prox and mid RCA 30%, EF 65%;  b. PCI 10/16/11: Promus DES x 2 to mLAD  . COPD (chronic obstructive pulmonary disease) (HCC)     mild  . Shortness of breath     before caediac stent  . Depression   . GERD (gastroesophageal reflux disease)   . Exertional angina ([place]) 03/08/2012   Past Surgical History  Procedure Laterality Date  . Partial hysterectomy  1980s  . Colonoscopy  01/2008    multiple diverticula in desc and sigm colon  . Esophagogastroduodenoscopy  01/2008    Barrett's esophagus, gastritis, no h.pylori, due follow-up 01/2011  . Flexible sigmoidoscopy  08/11/2010 RECTAL PAIN/PRESSURE    SML IH, TICS  .  Esophagogastroduodenoscopy  04/06/2011    Procedure: ESOPHAGOGASTRODUODENOSCOPY (EGD);  Surgeon: Danie Binder, MD;  [facility];  Service: Endoscopy;  Laterality: N/A;  8:30  . Abdominal hysterectomy    . Cardiac catheterization    . Fractional flow reserve wire N/A 10/16/2011    Procedure: FRACTIONAL FLOW RESERVE WIRE;  Surgeon: [name], MD;  Location: [facility] CATH LAB;  Service: Cardiovascular;  Laterality: N/A;  . Left heart catheterization with coronary angiogram N/A 03/07/2012    Procedure: LEFT HEART CATHETERIZATION WITH CORONARY ANGIOGRAM;  Surgeon: [name], MD;  Location: [facility] CATH LAB;  Service: Cardiovascular;  Laterality: N/A;  . Left heart catheterization with coronary angiogram N/A 12/14/2012    Procedure: LEFT HEART CATHETERIZATION WITH CORONARY ANGIOGRAM;  Surgeon: [name], MD;  Location: [facility] C[facility] LAB;  Service: Cardiovascular;  Laterality: N/A;  . Left heart catheterization with coronary angiogram N/A 05/07/2014    Procedure: LEFT HEART CATHETERIZATION WITH CORONARY ANGIOGRAM;  Surgeon: [name], MD;  Location: [facility] CATH LAB;  Service: Cardiovascular;  Laterality: N/A;   Family History  Problem Relation Age of Onset  . Coronary artery disease Father 62  . Heart attack Father 42  . Colon cancer Neg Hx   . GI problems Mother     perforated colon   . Coronary artery disease Paternal Aunt   . Coronary artery disease Paternal Uncle    Social History  Substance Use Topics  . Smoking status: Former Smoker -- 1.00 packs/day for 53 years    Types: Cigarettes    Quit date: 03/20/2007  . Smokeless tobacco: Never Used  . Alcohol Use: No   OB History    No data available     Review of Systems 10/14 systems reviewed and are negative other than those stated in the HPI    Allergies  Adhesive and Sulfa antibiotics  Home Medications   Prior to Admission medications   Medication Sig Start Date End Date Taking?  Authorizing Provider  ALPRAZolam (XANAX) 0.25 MG tablet Take 0.25 mg by mouth daily as needed. For anxiety   Yes Historical Provider, MD  aspirin 81 MG chewable tablet Chew 81 mg by mouth daily.   Yes Historical Provider, MD  atorvastatin (LIPITOR) 10 MG tablet Take 10 mg by mouth at bedtime.    Yes Historical Provider, MD  Biotin 5000 MCG CAPS Take 1 capsule by mouth daily.    Yes Historical Provider, MD  Calcium Carbonate-Vitamin D (CALCIUM + D) 600-200 MG-UNIT TABS Take 1 tablet by mouth 2 (two) times daily.    Yes Historical Provider, MD  clopidogrel (PLAVIX) 75 MG tablet Take 1 tablet (75 mg total) by mouth daily with  breakfast. 06/12/14  Yes [name], MD  docusate sodium (COLACE) 100 MG capsule Take 100-200 mg by mouth at bedtime.   Yes Historical Provider, MD  isosorbide mononitrate (IMDUR) 30 MG 24 hr tablet Take 30 mg by mouth daily.  03/13/14  Yes Historical Provider, MD  lisinopril-hydrochlorothiazide (PRINZIDE,ZESTORETIC) 20-12.5 MG per tablet Take 1 tablet by mouth at bedtime. 03/08/12  Yes Rhonda G Barrett, PA-C  pantoprazole (PROTONIX) 20 MG tablet Take 20 mg by mouth daily.   Yes Historical Provider, MD  Polyvinyl Alcohol-Povidone (REFRESH OP) Apply 2 drops to eye daily as needed (Scratchy eyes).   Yes Historical Provider, MD  potassium chloride SA (K-DUR,KLOR-CON) 20 MEQ tablet Take 40 mEq by mouth every morning.   Yes Historical Provider, MD  venlafaxine XR (EFFEXOR-XR) 37.5 MG 24 hr capsule Take 37.5 mg by mouth at bedtime.   Yes Historical Provider, MD  vitamin B-12 (CYANOCOBALAMIN) 1000 MCG tablet Take 2,000 mcg by mouth daily.   Yes Historical Provider, MD  diphenhydramine-acetaminophen (TYLENOL PM) 25-500 MG TABS Take 2 tablets by mouth at bedtime as needed (Sleep).    Historical Provider, MD  HYDROcodone-acetaminophen (NORCO/VICODIN) 5-325 MG tablet Take 1 tablet by mouth every 4 (four) hours as needed. Patient not taking: Reported on 04/24/2015 01/11/15   Lily Kocher, PA-C  pantoprazole (PROTONIX) 40 MG tablet Take 1 tablet by mouth  daily Patient not taking: Reported on 04/24/2015 06/12/14   [name], MD   BP 161/75 mmHg  Pulse 64  Temp(Src) 98.4 F (36.9 C) (Oral)  Resp 20  Ht 5' 1.5" (1.562 m)  Wt 125 lb 3.5 oz (56.8 kg)  BMI 23.28 kg/m2  SpO2 97% Physical Exam Physical Exam  Nursing note and vitals reviewed. Constitutional: Well developed, well nourished, non-toxic, and in no acute distress Head: Normocephalic and atraumatic.  Mouth/Throat: Oropharynx is clear and moist.  Neck: Normal range of motion. Neck supple.  Cardiovascular: Normal rate and regular rhythm.   Pulmonary/Chest: Effort normal and breath sounds normal.  Abdominal: Soft. There is no tenderness. There is no rebound and no guarding.  Musculoskeletal: Normal range of motion.  Neurological: Alert, no facial droop, fluent speech, moves all extremities symmetrically Skin: Skin is warm  and dry.  Psychiatric: Cooperative  ED Course  Procedures (including critical care time) Labs Review Labs Reviewed  COMPREHENSIVE METABOLIC PANEL - Abnormal; Notable for the following:    Sodium 128 (*)    Chloride 92 (*)    GFR calc non Af Amer 57 (*)    All other components within normal limits  MRSA PCR SCREENING  TROPONIN I  CBC  BRAIN NATRIURETIC PEPTIDE  TSH  TROPONIN I  HEPARIN LEVEL (UNFRACTIONATED)  T4, FREE  TROPONIN I  TROPONIN I    Imaging Review Dg Chest 2 View  04/24/2015  CLINICAL DATA:  Chest pain EXAM: CHEST  2 VIEW COMPARISON:  04/27/2014 FINDINGS: COPD with pulmonary hyperinflation. Heart size is normal. Coronary stents noted. Negative for heart failure. Lungs are clear without infiltrate or effusion. No mass or adenopathy. No acute skeletal abnormality. IMPRESSION: COPD.  No acute cardiopulmonary abnormality Coronary stents. Electronically Signed   By: Franchot Gallo M.D.   On: 04/24/2015 16:25   I have personally reviewed and evaluated these  images and lab results as part of my medical decision-making.   EKG Interpretation   Date/Time:  Wednesday April 24 2015 16:04:39 EDT Ventricular Rate:  66 PR Interval:  140 QRS Duration: 88 QT Interval:  386 QTC Calculation: 404 R Axis:   51 Text Interpretation:  Normal sinus rhythm Normal ECG no change from prior   Confirmed by Jevaun Strick MD, Jennea Rager ([id number]) on 04/24/2015 5:32:16 PM      MDM   Final diagnoses:  Chest pain, unspecified chest pain type  Unstable angina ([place])    75 year old female with history of CAD who presents with dyspnea and chest pain with exertion. Is asymptomatic on presentation. Given a full dose of aspirin. Vital signs are non-concerning. EKG is not ischemic troponin is negative. Chest x-ray shows no acute cardiopulmonary processes. Symptoms concerning for that of unstable angina, but she has had similar symptoms in the past with a negative left heart catheterization in April 2016. Discussed with Dr. Radford Pax from cardiology, who recommended admission to the hospitalist with heparin. Cardiology will evaluate in the morning. Discussed with Dr. Myna Hidalgo and admitted for ongoing management.    Forde Dandy, MD 04/25/15 [phone]

## 2015-04-24 NOTE — Telephone Encounter (Signed)
Agree. thanks

## 2015-04-24 NOTE — H&P (Signed)
[facility] History and Physical  Rhonda Hughes RJJ:[id number] DOB: Nov 12, 1940 DOA: 04/24/2015  Referring physician: ED physician PCP: PROVIDER NOT IN SYSTEM  Specialists: Dr. Angelena Form (cardiology)   Chief Complaint:  Chest tightness, SOB   HPI: Rhonda Hughes is a 75 y.o. female with PMH of hypertension, hyperlipidemia, depression, anxiety, COPD, and CAD with stents who presents to the ED with exertional dyspnea and chest tightness just prior to arrival. Patient reports that she been in her usual state of good health until approximately 2 weeks ago when she experienced insidious onset of exertional dyspnea. She reports that activities which she used to tolerate without shortness of breath, such as walking for 10 minutes or so, now lead to significant dyspnea. This had remained stable however until just prior to arrival. She had been doing some some work in the backyard which involved moving small boxes and furniture and developed a sensation of chest tightness and dyspnea. While walking up an incline to her house, symptoms worsened. Symptoms then improved, and eventually resolved, with rest. She contacted her PCP and was directed to the emergency department.  In ED, patient was found to be afebrile, saturating well on room air, and with vital signs stable. Chest x-ray is negative for acute cardiopulmonary disease, but features changes consistent with COPD. EKG demonstrates a normal sinus rhythm and initial troponin is undetectable. Patient was given an aspirin chew in the ED and cardiology was consulted by the EDP. Initiation of heparin infusion was recommended by cardiology. Patient was given heparin bolus and started on infusion per ACS dosing. She remained hemodynamically stable and pain-free while in the ED and will be admitted to the telemetry unit for ongoing evaluation and management of exertional angina.   Where does patient live?   At home     Can patient participate in ADLs?  Yes          Review of Systems:   General: no fevers, chills, sweats, weight change, poor appetite, or fatigue HEENT: no blurry vision, hearing changes or sore throat Pulm: no cough, or wheeze. Exertional dyspnea CV: no chest pain or palpitations. Exertional chest tightness Abd: no nausea, vomiting, abdominal pain, diarrhea, or constipation GU: no dysuria, hematuria, increased urinary frequency, or urgency  Ext: no leg edema Neuro: no focal weakness, numbness, or tingling, no vision change or hearing loss Skin: no rash, no wounds MSK: No muscle spasm, no deformity, no red, hot, or swollen joint Heme: No easy bruising or bleeding Travel history: No recent long distant travel    Allergy:  Allergies  Allergen Reactions  . Adhesive [Tape] Other (See Comments)    Turns skin red  . Sulfa Antibiotics Other (See Comments)    Childhood allergy    Past Medical History  Diagnosis Date  . Hyperlipidemia   . HTN (hypertension)   . Anxiety   . Diverticulosis   . Barrett's esophagus without dysplasia   . Rectocele   . CAD (coronary artery disease)     a. LHC 10/16/11: dLM 20%, mLAD 90%, pOM1 30%, mOM1 70% (FFR not hemodynamically significant), prox and mid RCA 30%, EF 65%;  b. PCI 10/16/11: Promus DES x 2 to mLAD  . COPD (chronic obstructive pulmonary disease) (HCC)     mild  . Shortness of breath     before caediac stent  . Depression   . GERD (gastroesophageal reflux disease)   . Exertional angina ([place]) 03/08/2012    Past Surgical History  Procedure Laterality Date  .  Partial hysterectomy  1980s  . Colonoscopy  01/2008    multiple diverticula in desc and sigm colon  . Esophagogastroduodenoscopy  01/2008    Barrett's esophagus, gastritis, no h.pylori, due follow-up 01/2011  . Flexible sigmoidoscopy  08/11/2010 RECTAL PAIN/PRESSURE    SML IH, TICS  . Esophagogastroduodenoscopy  04/06/2011    Procedure: ESOPHAGOGASTRODUODENOSCOPY (EGD);  Surgeon: Danie Binder, MD;  [facility];   Service: Endoscopy;  Laterality: N/A;  8:30  . Abdominal hysterectomy    . Cardiac catheterization    . Fractional flow reserve wire N/A 10/16/2011    Procedure: FRACTIONAL FLOW RESERVE WIRE;  Surgeon: [name], MD;  Location: [facility] CATH LAB;  Service: Cardiovascular;  Laterality: N/A;  . Left heart catheterization with coronary angiogram N/A 03/07/2012    Procedure: LEFT HEART CATHETERIZATION WITH CORONARY ANGIOGRAM;  Surgeon: [name], MD;  Location: [facility] CATH LAB;  Service: Cardiovascular;  Laterality: N/A;  . Left heart catheterization with coronary angiogram N/A 12/14/2012    Procedure: LEFT HEART CATHETERIZATION WITH CORONARY ANGIOGRAM;  Surgeon: [name], MD;  Location: [facility] CATH LAB;  Service: Cardiovascular;  Laterality: N/A;  . Left heart catheterization with coronary angiogram N/A 05/07/2014    Procedure: LEFT HEART CATHETERIZATION WITH CORONARY ANGIOGRAM;  Surgeon: [name], MD;  Location: [facility] CATH LAB;  Service: Cardiovascular;  Laterality: N/A;    Social History:  reports that she quit smoking about 8 years ago. Her smoking use included Cigarettes. She has a 53 pack-year smoking history. She has never used smokeless tobacco. She reports that she does not drink alcohol or use illicit drugs.  Family History:  Family History  Problem Relation Age of Onset  . Coronary artery disease Father 77  . Heart attack Father 38  . Colon cancer Neg Hx   . GI problems Mother     perforated colon   . Coronary artery disease Paternal Aunt   . Coronary artery disease Paternal Uncle      Prior to Admission medications   Medication Sig Start Date End Date Taking? Authorizing Provider  ALPRAZolam (XANAX) 0.25 MG tablet Take 0.25 mg by mouth daily as needed. For anxiety   Yes Historical Provider, MD  aspirin 81 MG chewable tablet Chew 81 mg by mouth daily.   Yes Historical Provider, MD  atorvastatin (LIPITOR) 10 MG tablet Take 10 mg by mouth at  bedtime.    Yes Historical Provider, MD  Biotin 5000 MCG CAPS Take 1 capsule by mouth daily.    Yes Historical Provider, MD  Calcium Carbonate-Vitamin D (CALCIUM + D) 600-200 MG-UNIT TABS Take 1 tablet by mouth 2 (two) times daily.    Yes Historical Provider, MD  clopidogrel (PLAVIX) 75 MG tablet Take 1 tablet (75 mg total) by mouth daily with  breakfast. 06/12/14  Yes [name], MD  docusate sodium (COLACE) 100 MG capsule Take 100-200 mg by mouth at bedtime.   Yes Historical Provider, MD  isosorbide mononitrate (IMDUR) 30 MG 24 hr tablet Take 30 mg by mouth daily.  03/13/14  Yes Historical Provider, MD  lisinopril-hydrochlorothiazide (PRINZIDE,ZESTORETIC) 20-12.5 MG per tablet Take 1 tablet by mouth at bedtime. 03/08/12  Yes Rhonda G Barrett, PA-C  pantoprazole (PROTONIX) 20 MG tablet Take 20 mg by mouth daily.   Yes Historical Provider, MD  Polyvinyl Alcohol-Povidone (REFRESH OP) Apply 2 drops to eye daily as needed (Scratchy eyes).   Yes Historical Provider, MD  potassium chloride SA (K-DUR,KLOR-CON) 20 MEQ tablet  Take 40 mEq by mouth every morning.   Yes Historical Provider, MD  venlafaxine XR (EFFEXOR-XR) 37.5 MG 24 hr capsule Take 37.5 mg by mouth at bedtime.   Yes Historical Provider, MD  vitamin B-12 (CYANOCOBALAMIN) 1000 MCG tablet Take 2,000 mcg by mouth daily.   Yes Historical Provider, MD  diphenhydramine-acetaminophen (TYLENOL PM) 25-500 MG TABS Take 2 tablets by mouth at bedtime as needed (Sleep).    Historical Provider, MD  HYDROcodone-acetaminophen (NORCO/VICODIN) 5-325 MG tablet Take 1 tablet by mouth every 4 (four) hours as needed. Patient not taking: Reported on 04/24/2015 01/11/15   Lily Kocher, PA-C  pantoprazole (PROTONIX) 40 MG tablet Take 1 tablet by mouth  daily Patient not taking: Reported on 04/24/2015 06/12/14   [name], MD    Physical Exam: Filed Vitals:   04/24/15 1901 04/24/15 1908 04/24/15 1943 04/24/15 2018  BP: 152/51 151/64 130/60 157/65   Pulse: 67 67 69 80  Temp:      TempSrc:      Resp: '19 13 17 17  '$ Height:      Weight:      SpO2: 95% 95% 93% 96%   General: Not in acute distress HEENT:       Eyes: PERRL, EOMI, no scleral icterus or conjunctival pallor.       ENT: No discharge from the ears or nose, no pharyngeal ulcers.        Neck: No JVD, no bruit, no appreciable mass Heme: No cervical adenopathy, no pallor Cardiac: S1/S2, RRR, No murmurs, No gallops or rubs. Pulm: Good air movement bilaterally. No rales, wheezing, rhonchi or rubs. Abd: Soft, nondistended, nontender, no rebound pain or gaurding, BS present. Ext: No LE edema bilaterally. 2+DP/PT pulse bilaterally. Musculoskeletal: No gross deformity, no red, hot, swollen joints   Skin: No rashes or wounds on exposed surfaces  Neuro: Alert, oriented X3, cranial nerves II-XII grossly intact. No focal findings Psych: Patient is not overtly psychotic, appropriate mood and affect.  Labs on Admission:  Basic Metabolic Panel:  Recent Labs Lab 04/24/15 1740  NA 128*  K 4.1  CL 92*  CO2 27  GLUCOSE 97  BUN 12  CREATININE 0.97  CALCIUM 9.1   Liver Function Tests:  Recent Labs Lab 04/24/15 1740  AST 20  ALT 15  ALKPHOS 77  BILITOT 0.7  PROT 7.8  ALBUMIN 4.5   No results for input(s): LIPASE, AMYLASE in the last 168 hours. No results for input(s): AMMONIA in the last 168 hours. CBC:  Recent Labs Lab 04/24/15 1740  WBC 9.5  HGB 12.8  HCT 37.3  MCV 93.5  PLT 288   Cardiac Enzymes:  Recent Labs Lab 04/24/15 1740  TROPONINI <0.03    BNP (last 3 results)  Recent Labs  04/24/15 1740  BNP 6.0    ProBNP (last 3 results) No results for input(s): PROBNP in the last 8760 hours.  CBG: No results for input(s): GLUCAP in the last 168 hours.  Radiological Exams on Admission: Dg Chest 2 View  04/24/2015  CLINICAL DATA:  Chest pain EXAM: CHEST  2 VIEW COMPARISON:  04/27/2014 FINDINGS: COPD with pulmonary hyperinflation. Heart size is  normal. Coronary stents noted. Negative for heart failure. Lungs are clear without infiltrate or effusion. No mass or adenopathy. No acute skeletal abnormality. IMPRESSION: COPD.  No acute cardiopulmonary abnormality Coronary stents. Electronically Signed   By: Franchot Gallo M.D.   On: 04/24/2015 16:25    EKG: Independently reviewed.  Abnormal findings:  Normal sinus rhythm  Assessment/Plan  1. Exertional angina  - History is concerning for cardiac etiology, but initial trop undetectable and EKG unchanged  - Possible that sxs are pulmonary  - Monitor on telemetry for ischemic changes  - Obtain serial troponin measurements over night, repeat EKG in am - ASA chew and statin given  - Check fasting lipids and A1c, intervene prn   2. CAD - Overlapping DESs placed to mid-LAD in Sept '13; DES to 1st OM in Feb '14; cath in April '16 demonstrated widely patent stents and was otherwise normal with normal EF  - Continue ASA, Plavix, lisinopril, Imdur, Lipitor  - Monitoring for ACS as above    3. HTN  - At goal currently  - Continue home-dose lisinopril - Hold HCTZ while monitoring hyponatremia    4. Depression, anxiety  - Stable  - Continue home-dose Effexor, prn Xanax    5. Hyponatremia  - Serum sodium 128 on admission; HCTZ may be contributing  - Hold HCTZ  - Repeat chem panel in am     DVT ppx:  Heparin infusion per ACS dosing  Code Status: Full code Family Communication:  Yes, patient's husband at bed side Disposition Plan: Admit to inpatient   Date of Service 04/24/2015    Vianne Bulls, MD [facility] Pager [phone]  If 7PM-7AM, please contact night-coverage [email or web address] Password [facility] 04/24/2015, 8:42 PM

## 2015-04-24 NOTE — Telephone Encounter (Signed)
---   Message -----    From: Liana Gerold    Sent: 04/24/2015  2:29 PM EDT      To: Lauree Chandler, MD Subject: Non-Urgent Medical Question  Pat,I am having a real hard time with My breathing today and tightness in center of chest.My question is ,should I get checked and Where(not [place])?    Above message copied from my chart message.  I responded to pt I would call her to discuss her symptoms and if having chest pain she should call EMS. I spoke with pt. She reports shortness of breath and chest tightness recently.  Improves with rest. Usually occurs with walking up an incline or working in yard.  Feels completely fatigued and worn out.  Today had episode that was the worst she has had. Was not doing anything strenuous when tightness started today. Tightness has improved some but has not completely gone away.  I instructed pt due to ongoing tightness she needed to go to hospital today.  She does not want to go to [facility] so will go to [facility]. She does not want to call EMS because they will take her to [place]. She will have husband transport her to hospital.

## 2015-04-24 NOTE — Progress Notes (Signed)
ANTICOAGULATION CONSULT NOTE - Initial Consult  Pharmacy Consult for heparin Indication: chest pain/ACS  Allergies  Allergen Reactions  . Adhesive [Tape] Other (See Comments)    Turns skin red  . Sulfa Antibiotics Other (See Comments)    Childhood allergy    Patient Measurements: Height: '5\' 1"'$  (154.9 cm) Weight: 128 lb (58.06 kg) IBW/kg (Calculated) : 47.8 Heparin Dosing Weight: 53 kg  Vital Signs: Temp: 98 F (36.7 C) (04/05 1600) Temp Source: Oral (04/05 1600) BP: 130/60 mmHg (04/05 1943) Pulse Rate: 69 (04/05 1943)  Labs:  Recent Labs  04/24/15 1740  HGB 12.8  HCT 37.3  PLT 288  CREATININE 0.97  TROPONINI <0.03    Estimated Creatinine Clearance: 43 mL/min (by C-G formula based on Cr of 0.97).   Medical History: Past Medical History  Diagnosis Date  . Hyperlipidemia   . HTN (hypertension)   . Anxiety   . Diverticulosis   . Barrett's esophagus without dysplasia   . Rectocele   . CAD (coronary artery disease)     a. LHC 10/16/11: dLM 20%, mLAD 90%, pOM1 30%, mOM1 70% (FFR not hemodynamically significant), prox and mid RCA 30%, EF 65%;  b. PCI 10/16/11: Promus DES x 2 to mLAD  . COPD (chronic obstructive pulmonary disease) (HCC)     mild  . Shortness of breath     before caediac stent  . Depression   . GERD (gastroesophageal reflux disease)   . Exertional angina ([place]) 03/08/2012    Medications:  See medication history  Assessment: 75 yo lady to start heparin for r/o ACS.  She was not on anticoagulation PTA Goal of Therapy:  Heparin level 0.3-0.7 units/ml Monitor platelets by anticoagulation protocol: Yes   Plan:  Heparin bolus 3000 units and drip at 850 units/hr Check heparin level and CBC daily while on heparin Monitor for bleeding complications  Thanks for allowing pharmacy to be a part of this patient's care.  Excell Seltzer, PharmD Clinical Pharmacist 04/24/2015,8:02 PM

## 2015-04-25 ENCOUNTER — Other Ambulatory Visit: Payer: Self-pay | Admitting: Adult Health

## 2015-04-25 ENCOUNTER — Encounter (HOSPITAL_COMMUNITY): Payer: Self-pay | Admitting: Cardiology

## 2015-04-25 DIAGNOSIS — I25119 Atherosclerotic heart disease of native coronary artery with unspecified angina pectoris: Secondary | ICD-10-CM | POA: Diagnosis not present

## 2015-04-25 DIAGNOSIS — J449 Chronic obstructive pulmonary disease, unspecified: Secondary | ICD-10-CM | POA: Diagnosis not present

## 2015-04-25 DIAGNOSIS — I2510 Atherosclerotic heart disease of native coronary artery without angina pectoris: Secondary | ICD-10-CM | POA: Diagnosis not present

## 2015-04-25 DIAGNOSIS — R079 Chest pain, unspecified: Secondary | ICD-10-CM | POA: Diagnosis not present

## 2015-04-25 DIAGNOSIS — I208 Other forms of angina pectoris: Secondary | ICD-10-CM

## 2015-04-25 DIAGNOSIS — I209 Angina pectoris, unspecified: Secondary | ICD-10-CM | POA: Diagnosis not present

## 2015-04-25 DIAGNOSIS — I10 Essential (primary) hypertension: Secondary | ICD-10-CM

## 2015-04-25 DIAGNOSIS — E871 Hypo-osmolality and hyponatremia: Secondary | ICD-10-CM

## 2015-04-25 DIAGNOSIS — K219 Gastro-esophageal reflux disease without esophagitis: Secondary | ICD-10-CM

## 2015-04-25 LAB — HEPARIN LEVEL (UNFRACTIONATED): HEPARIN UNFRACTIONATED: 0.71 [IU]/mL — AB (ref 0.30–0.70)

## 2015-04-25 LAB — TROPONIN I
Troponin I: <0.03 ng/mL (ref <0.031)
Troponin I: <0.03 ng/mL (ref <0.031)

## 2015-04-25 LAB — T4, FREE: Free T4: 1.55 ng/dL — ABNORMAL HIGH (ref 0.61–1.12)

## 2015-04-25 LAB — MRSA PCR SCREENING: MRSA BY PCR: NEGATIVE

## 2015-04-25 MED ORDER — ZOLPIDEM TARTRATE 5 MG PO TABS
5.0000 mg | ORAL_TABLET | Freq: Every evening | ORAL | Status: DC | PRN
Start: 2015-04-25 — End: 2015-04-25

## 2015-04-25 MED ORDER — FUROSEMIDE 20 MG PO TABS: 20.0000 mg | ORAL_TABLET | Freq: Every day | ORAL | Status: DC

## 2015-04-25 MED ORDER — POLYVINYL ALCOHOL 1.4 % OP SOLN
2.0000 [drp] | Freq: Two times a day (BID) | OPHTHALMIC | Status: DC | PRN
  Filled 2015-04-25: qty 15

## 2015-04-25 MED ORDER — HEPARIN SODIUM (PORCINE) 5000 UNIT/ML IJ SOLN
5000.0000 [IU] | Freq: Three times a day (TID) | INTRAMUSCULAR | Status: DC
  Administered 2015-04-25: 5000 [IU] via SUBCUTANEOUS
  Filled 2015-04-25: qty 1

## 2015-04-25 MED ORDER — BISOPROLOL FUMARATE 5 MG PO TABS: 5.0000 mg | ORAL_TABLET | Freq: Every day | ORAL | Status: DC

## 2015-04-25 MED ORDER — LISINOPRIL 20 MG PO TABS: 20.0000 mg | ORAL_TABLET | Freq: Every day | ORAL | Status: DC

## 2015-04-25 MED ORDER — NITROGLYCERIN 0.4 MG SL SUBL: 0.4000 mg | SUBLINGUAL_TABLET | SUBLINGUAL | Status: DC | PRN

## 2015-04-25 MED ORDER — PANTOPRAZOLE SODIUM 40 MG PO TBEC
40.0000 mg | DELAYED_RELEASE_TABLET | Freq: Every day | ORAL | Status: DC
  Filled 2015-04-25: qty 1

## 2015-04-25 NOTE — Care Management Obs Status (Signed)
[facility] NOTIFICATION   Patient Details  Name: Rhonda Hughes MRN: [id number] Date of Birth: Feb 10, 1940   Medicare Observation Status Notification Given:  No (Pt discharged <24 hrs. )    Sherald Barge, RN 04/25/2015, 1:47 PM

## 2015-04-25 NOTE — Progress Notes (Signed)
D.c instructions reviewed with patient and family. Verbalized understanding. Pt dc'd to home with family.

## 2015-04-25 NOTE — Consult Note (Signed)
CARDIOLOGY CONSULT NOTE   Patient ID: Rhonda Hughes MRN: [id number] DOB/AGE: 06/03/1940 75 y.o.  Admit Date: 04/24/2015 Referring Physician: [facility]-Hernandez Primary Physician: PROVIDER NOT [place] Consulting Cardiologist: Rozann Lesches MD Primary Cardiologist: Lauree Chandler MD Reason for Consultation: Chest Pain-Known CAD  Clinical Summary Rhonda Hughes is a 75 y.o.female with known history of CAD, DES x 2 to mid LAD 2013, with most recent cardiac cath in 04/2014 revealing patent stents. She continues on dual antiplatelet therapy. Additional problems include hypertension, hyperlipidemia, COPD and Barrett's esophagus. She presented to ER with complaints of exertional dyspnea and chest pain.  She states she is working in her yard and began to walk up an incline, had worsening dyspnea. Resting relieved the symptoms. She went back outside and continued work when her breathing again became worse and she had some tightness in her chest. She states over the last several weeks, since the season change her breathing status has worsened in general. She denies any wheezing, cough or congestion. Her husband noticed that she had worsening fatigue, and was more easily short of breath. She does not do any heavy housework to include mopping or vacuuming because of her breathing status. At the insistence of her husband, due to her worsening symptoms, she presented to the emergency room.  On arrival to the emergency room, the patient's blood pressure was 159/95, heart rate 70, O2 sat 99%, afebrile. She was found to be hyponatremic with a sodium of 128 (fairly chronic), chloride 92, BUN and creatinine were normal. Chest x-ray is negative for acute cardiopulmonary disease, but features changes consistent with COPD. ECG demonstrates normal sinus rhythm without evidence of ST-T wave changes indicative of ACS, troponin has been negative 3.   She was treated with aspirin, started on heparin drip after  bolus, provided with one dose of Colace and calcium. She is now in ICU and symptom free.  Allergies  Allergen Reactions  . Adhesive [Tape] Other (See Comments)    Turns skin red  . Sulfa Antibiotics Other (See Comments)    Childhood allergy    Medications Scheduled Medications: . aspirin  81 mg Oral Daily  . atorvastatin  40 mg Oral QHS  . calcium-vitamin D  1 tablet Oral BID  . clopidogrel  75 mg Oral Daily  . docusate sodium  100-200 mg Oral QHS  . heparin subcutaneous  5,000 Units Subcutaneous 3 times per day  . isosorbide mononitrate  30 mg Oral Daily  . lisinopril  20 mg Oral Daily  . pantoprazole  40 mg Oral Daily  . potassium chloride SA  40 mEq Oral q morning - 10a  . venlafaxine XR  37.5 mg Oral QHS  . vitamin B-12  2,000 mcg Oral Daily    PRN Medications: acetaminophen, ALPRAZolam, nitroGLYCERIN, ondansetron (ZOFRAN) IV, polyvinyl alcohol, zolpidem   Past Medical History  Diagnosis Date  . Hyperlipidemia   . Essential hypertension   . Anxiety   . Diverticulosis   . Barrett's esophagus without dysplasia   . Rectocele   . CAD (coronary artery disease)     a. LHC 10/16/11: dLM 20%, mLAD 90%, pOM1 30%, mOM1 70% (FFR not hemodynamically significant), prox and mid RCA 30%, EF 65%;  b. PCI 10/16/11: Promus DES x 2 to mLAD  . COPD (chronic obstructive pulmonary disease) ([place])   . Depression   . GERD (gastroesophageal reflux disease)     Past Surgical History  Procedure Laterality Date  . Partial hysterectomy  1980s  .  Colonoscopy  01/2008    Multiple diverticula in desc and sigm colon  . Esophagogastroduodenoscopy  01/2008    Barrett's esophagus, gastritis, no h.pylori, due follow-up 01/2011  . Flexible sigmoidoscopy  08/11/2010 RECTAL PAIN/PRESSURE    SML IH, TICS  . Esophagogastroduodenoscopy  04/06/2011    Procedure: ESOPHAGOGASTRODUODENOSCOPY (EGD);  Surgeon: Danie Binder, MD;  Location: [facility];  Service: Endoscopy;  Laterality: N/A;  8:30  . Abdominal  hysterectomy    . Cardiac catheterization    . Fractional flow reserve wire N/A 10/16/2011    Procedure: FRACTIONAL FLOW RESERVE WIRE;  Surgeon: [name], MD;  Location: [facility] CATH LAB;  Service: Cardiovascular;  Laterality: N/A;  . Left heart catheterization with coronary angiogram N/A 03/07/2012    Procedure: LEFT HEART CATHETERIZATION WITH CORONARY ANGIOGRAM;  Surgeon: [name], MD;  Location: [facility] CATH LAB;  Service: Cardiovascular;  Laterality: N/A;  . Left heart catheterization with coronary angiogram N/A 12/14/2012    Procedure: LEFT HEART CATHETERIZATION WITH CORONARY ANGIOGRAM;  Surgeon: [name], MD;  Location: [facility] CATH LAB;  Service: Cardiovascular;  Laterality: N/A;  . Left heart catheterization with coronary angiogram N/A 05/07/2014    Procedure: LEFT HEART CATHETERIZATION WITH CORONARY ANGIOGRAM;  Surgeon: [name], MD;  Location: [facility] CATH LAB;  Service: Cardiovascular;  Laterality: N/A;    Family History  Problem Relation Age of Onset  . Coronary artery disease Father 71  . Heart attack Father 85  . Colon cancer Neg Hx   . GI problems Mother     Perforated colon   . Coronary artery disease Paternal Aunt   . Coronary artery disease Paternal Uncle     Social History Rhonda Hughes reports that she quit smoking about 8 years ago. Her smoking use included Cigarettes. She has a 53 pack-year smoking history. She has never used smokeless tobacco. Rhonda Hughes reports that she does not drink alcohol.  Review of Systems Complete review of systems are found to be negative unless outlined in H&P above.  Physical Examination Blood pressure 161/66, pulse 73, temperature 97.5 F (36.4 C), temperature source Oral, resp. rate 16, height 5' 1.5" (1.562 m), weight 125 lb 3.5 oz (56.8 kg), SpO2 96 %.  Intake/Output Summary (Last 24 hours) at 04/25/15 0925 Last data filed at 04/25/15 0600  Gross per 24 hour  Intake  299.5 ml  Output    500 ml    Net -200.5 ml    Telemetry: Normal sinus rhythm with occasional PVCs, and a short salvo PVCs early yesterday evening.  GEN: No acute distress. HEENT: Conjunctiva and lids normal, oropharynx clear. Neck: Supple, no elevated JVP or carotid bruits, no thyromegaly. Lungs: Clear to auscultation, nonlabored breathing at rest. Cardiac: Regular rate and rhythm, no S3 or significant systolic murmur, no pericardial rub. Abdomen: Soft, nontender, bowel sounds present, no guarding or rebound. Extremities: No pitting edema, distal pulses 2+. Skin: Warm and dry. Musculoskeletal: No kyphosis. Neuropsychiatric: Alert and oriented x3, affect grossly appropriate.  Prior Cardiac Testing/Procedures  Cardiac Cath 04/2014 Left main: The distal vessel has 30% stenosis. This was closely examined in multiple views and did not appear to significant.  Left Anterior Descending Artery: Moderate sized vessel that courses to the apex. The ostium has a 30-40% stenosis. The proximal vessel has diffuse plaque. There are patent overlapping stents in the mid segment with 30% restenosis in the distal stented segment (unchanged from last cath). There is calcification in the proximal and mid vessel.  The diagonal branch is very small.  Circumflex Artery: Moderate sized vessel with moderate sized obtuse marginal branch. The first obtuse marginal branch has 30% proximal stenosis and a patent stent in the mid segment with no restenosis. The AV groove Circumflex is small in caliber beyond the takeoff of the marginal branch. Moderate caliber intermediate branch with mild diffuse plaque.  Right Coronary Artery: Moderate sized dominant vessel with ostial 20% stenosis, diffuse 30% stenosis in the proximal and mid vessel. No flow limiting lesions noted.  Left Ventricular Angiogram: LVEF=65-70%.   Impression: 1. Stable double vessel CAD with patent stents mid LAD and obtuse marginal 2. Mild disease distal left main and ostial LAD  (unchanged angiographically from last cath. IVUS 2014 demonstrated mild plaque in the distal left main and ostial LAD).  3. Normal LV systolic function  Lab Results  Basic Metabolic Panel:  Recent Labs Lab 04/24/15 1740  NA 128*  K 4.1  CL 92*  CO2 27  GLUCOSE 97  BUN 12  CREATININE 0.97  CALCIUM 9.1    Liver Function Tests:  Recent Labs Lab 04/24/15 1740  AST 20  ALT 15  ALKPHOS 77  BILITOT 0.7  PROT 7.8  ALBUMIN 4.5    CBC:  Recent Labs Lab 04/24/15 1740  WBC 9.5  HGB 12.8  HCT 37.3  MCV 93.5  PLT 288    Cardiac Enzymes:  Recent Labs Lab 04/24/15 1740 04/24/15 2050 04/25/15 0352  TROPONINI <0.03 <0.03 <0.03    Radiology: Dg Chest 2 View  04/24/2015  CLINICAL DATA:  Chest pain EXAM: CHEST  2 VIEW COMPARISON:  04/27/2014 FINDINGS: COPD with pulmonary hyperinflation. Heart size is normal. Coronary stents noted. Negative for heart failure. Lungs are clear without infiltrate or effusion. No mass or adenopathy. No acute skeletal abnormality. IMPRESSION: COPD.  No acute cardiopulmonary abnormality Coronary stents. Electronically Signed   By: Franchot Gallo M.D.   On: 04/24/2015 16:25    ECG: Normal sinus rhythm , rate of 66 bpm.  Impression and Recommendations  1. Symptoms consistent with exertional angina: Present over the last 3-4 weeks, associated with worsening breathing with exertion. Known history of CAD, prompting ER evaluation. Troponin I has been negative 3 with normal ECG without evidence of ACS. She has had no recurrent chest pain under observation and wants to go home today. Follow-up stress testing has been recommended, and she prefers to do this as an outpatient. She has a follow with Dr. Angelena Form within the next few weeks.  2. CAD: Most recent cardiac catheterization in April 2016 demonstrated stable double vessel CAD with patent stents mid LAD and obtuse marginal. Mild disease distal left main and ostial LAD (unchanged angiographically  from last cath. IVUS 2014 demonstrated mild plaque in the distal left main and ostial LAD). Normal LV systolic function. She states that the symptoms are similar to symptoms she had prior to stenting of LAD. Chest tightness is new symptom, however. She continues on dual antiplatelet therapy with Plavix and aspirin.  3. COPD: Chest x-ray confirms. She is not being seen by a pulmonologist, nor is she on any bronchodilators. She states she was seen by a pulmonologist prior to having stent placement, but has not been followed since. Symptoms have worsened since season initiated.   4. Hyponatremia: Patient is on HCTZ in addition to ACE inhibitor. May consider taking her off HCTZ in this setting.  5. Barrett's esophagus: Continues on PPI. We will check magnesium.  Signed: Phill Myron. Purcell Nails NP [place]  04/25/2015, 9:25 AM Co-Sign MD   Attending note:  Patient seen and examined. Reviewed records and modified above note by Ms. Lawrence NP. Rhonda Hughes is a patient of Dr. Angelena Form with history of CAD status post DES 2 to the mid LAD in 2013, otherwise nonobstructive disease. Her last cardiac catheterization in April 2016 showed patent stent sites. She presents with a 3 to 4 week history of exertional shortness of breath and fatigue as well as intermittent chest tightness consistent with angina. She has not used nitroglycerin, states that her symptoms resolve with rest. She has noted increasing intensity symptoms recently with yard work including walking up an incline.  Under hospital observation she has had no further chest pain. Troponin I levels have been negative 3 and her ECG shows no significant changes. Systolic blood pressure ranging from 100-160, heart rate in the 70s in sinus rhythm. Lungs are clear without labored breathing, cardiac exam reveals RRR without gallop. Lab work shows sodium 128, potassium 4.1, creatinine 0.9, BNP normal, hemoglobin 12.8, platelets 288. Chest x-ray shows findings  consistent with COPD, no infiltrates or effusions.  Patient presents with exertional angina symptoms, increasing intensity in the last few weeks with activities such as yard work and walking up an incline. Also has apparent COPD based on chest x-ray, no active wheezing however. She states the symptoms are similar to prior angina. I have recommended follow-up ischemic evaluation, she states that she would like to go home today and pursue this as an outpatient however. She already has a follow-up visit scheduled with Dr. Angelena Form in the next few weeks. We will arrange an exercise Cardiolite (to be done at [facility] per her request for convenience since she lives in [place]) for next week on April 12. Plan to forward results to Dr. Angelena Form so that he can review this at the patient's office visit. I did ask her to seek urgent medical attention if her symptoms suddenly escalated in the interim.  Satira Sark, M.D., F.A.C.C.

## 2015-04-25 NOTE — Discharge Summary (Signed)
Physician Discharge Summary  Rhonda Hughes RWE:[id number] DOB: 1940-03-20 DOA: 04/24/2015  PCP: PROVIDER NOT IN SYSTEM  Admit date: 04/24/2015 Discharge date: 04/25/2015  Time spent: 35 minutes  Recommendations for Outpatient Follow-up:  1. Repeat BMET to follow electrolytes and renal function 2. Reassess BP and adjust medications as needed  3. Patient will benefit of outpatient referral for PFT's and further evaluation of presumed COPD (seen on CXR and with prior hx of tobacco abuse)   Discharge Diagnoses:  Principal Problem:   Exertional angina ([place]) Active Problems:   HTN (hypertension)   CAD (coronary artery disease), native coronary artery   Hyponatremia GERD Depression/anxiety  Discharge Condition: stable and improved. Discharge home with instruction to follow with cardiology service for stress test next week and follow up with PCP in 1-2 weeks.  Diet recommendation: heart healthy diet   Filed Weights   04/24/15 1600 04/24/15 2340  Weight: 58.06 kg (128 lb) 56.8 kg (125 lb 3.5 oz)    History of present illness:  As per Dr. Myna Hidalgo H&P written on 04/24/15 75 y.o. female with PMH of hypertension, hyperlipidemia, depression, anxiety, COPD, and CAD with stents who presents to the ED with exertional dyspnea and chest tightness just prior to arrival. Patient reports that she been in her usual state of good health until approximately 2 weeks ago when she experienced insidious onset of exertional dyspnea. She reports that activities which she used to tolerate without shortness of breath, such as walking for 10 minutes or so, now lead to significant dyspnea. This had remained stable however until just prior to arrival. She had been doing some some work in the backyard which involved moving small boxes and furniture and developed a sensation of chest tightness and dyspnea. While walking up an incline to her house, symptoms worsened. Symptoms then improved, and eventually resolved, with rest.  She contacted her PCP and was directed to the emergency department.  Hospital Course:  1. Exertional angina  - History is concerning for cardiac etiology, given hx and classic description -patient r/o for ACS (no EKG or telemetry changes, neg troponin X4) -after discussing with cardiology and following patient request will discharge home with outpatient stress test and further evaluation. -will continue ASA, imdur, lisinopril, plavix and statins -will also add low dose bisoprolol and provide prescription for NTG -patient advise to seek medical attention if experienced any further symptoms.  2. CAD - Overlapping DESs placed to mid-LAD in Sept '13; DES to 1st OM in Feb '14; cath in April '16 demonstrated widely patent stents and was otherwise normal with normal EF  - Continue ASA, Plavix, lisinopril, Imdur and Lipitor  - ACS has been r/o -no further CP or SOB; plan is for outpatient stress test and further treatment base on results. -will add low dose bisoprolol to her regimen   3. HTN  - slightly elevated -advise to follow low sodium diet -will continue use of lisinopril, but will use lasix (instead of HCTZ and will add low dose bisoprolol -follow BMET and reassess BP during follow up visit    4. Depression, anxiety  - Stable  - Continue home-dose Effexor, prn Xanax  -no SI or hallucinations   5. Hyponatremia/hypokalemia - Serum sodium 128 on admission; HCTZ most likely reason for abnormality  - will discontinue HCTZ  - Repeat chem panel during follow up visit   -continue potassium supplementation   6.presumed COPD on CXR and prior hx of smoking -will benefit of outpatient follow up with pulmonary  service for PFT's -currently no wheezing and denies SOB  7. GERD -continue PPI  Procedures:  See below for x-ray reports   Consultations:  Cardiology   Discharge Exam: Filed Vitals:   04/25/15 0844 04/25/15 0900  BP:  161/66  Pulse:  73  Temp: 97.5 F (36.4  C)   Resp:  16    General: afebrile, no SOB, no orthopnea and no CP. Ready to go home. Cardiovascular: S1 and S2, no rubs or gallops Respiratory: good air movement, no wheezing, no crackles Abd: soft, NT, ND, positive BS Extremities: no edema or cyanosis   Discharge Instructions   Discharge Instructions    Diet - low sodium heart healthy    Complete by:  As directed      Discharge instructions    Complete by:  As directed   Take medications as prescribed Please follow up with cardiology service as instructed  Follow heart healthy diet  Maintain adequate hydration  Please hold bisoprolol one day before your upcoming stress test          Current Discharge Medication List    START taking these medications   Details  bisoprolol (ZEBETA) 5 MG tablet Take 1 tablet (5 mg total) by mouth daily. Qty: 30 tablet, Refills: 1    furosemide (LASIX) 20 MG tablet Take 1 tablet (20 mg total) by mouth daily. Qty: 30 tablet, Refills: 1    lisinopril (PRINIVIL,ZESTRIL) 20 MG tablet Take 1 tablet (20 mg total) by mouth daily. Qty: 30 tablet, Refills: 1    nitroGLYCERIN (NITROSTAT) 0.4 MG SL tablet Place 1 tablet (0.4 mg total) under the tongue every 5 (five) minutes x 3 doses as needed for chest pain. Qty: 15 tablet, Refills: 1      CONTINUE these medications which have NOT CHANGED   Details  ALPRAZolam (XANAX) 0.25 MG tablet Take 0.25 mg by mouth daily as needed. For anxiety    aspirin 81 MG chewable tablet Chew 81 mg by mouth daily.    atorvastatin (LIPITOR) 10 MG tablet Take 10 mg by mouth at bedtime.     Biotin 5000 MCG CAPS Take 1 capsule by mouth daily.     Calcium Carbonate-Vitamin D (CALCIUM + D) 600-200 MG-UNIT TABS Take 1 tablet by mouth 2 (two) times daily.     clopidogrel (PLAVIX) 75 MG tablet Take 1 tablet (75 mg total) by mouth daily with  breakfast. Qty: 90 tablet, Refills: 3   Associated Diagnoses: Atherosclerosis of native coronary artery of native heart without  angina pectoris    docusate sodium (COLACE) 100 MG capsule Take 100-200 mg by mouth at bedtime.    isosorbide mononitrate (IMDUR) 30 MG 24 hr tablet Take 30 mg by mouth daily.     pantoprazole (PROTONIX) 20 MG tablet Take 20 mg by mouth daily.    Polyvinyl Alcohol-Povidone (REFRESH OP) Apply 2 drops to eye daily as needed (Scratchy eyes).    potassium chloride SA (K-DUR,KLOR-CON) 20 MEQ tablet Take 40 mEq by mouth every morning.    venlafaxine XR (EFFEXOR-XR) 37.5 MG 24 hr capsule Take 37.5 mg by mouth at bedtime.    vitamin B-12 (CYANOCOBALAMIN) 1000 MCG tablet Take 2,000 mcg by mouth daily.    diphenhydramine-acetaminophen (TYLENOL PM) 25-500 MG TABS Take 2 tablets by mouth at bedtime as needed (Sleep).      STOP taking these medications     lisinopril-hydrochlorothiazide (PRINZIDE,ZESTORETIC) 20-12.5 MG per tablet      HYDROcodone-acetaminophen (NORCO/VICODIN) 5-325 MG tablet  Allergies  Allergen Reactions  . Adhesive [Tape] Other (See Comments)    Turns skin red  . Sulfa Antibiotics Other (See Comments)    Childhood allergy   Follow-up Information    Follow up with [facility] On 05/01/2015.   Why:  09:15 am at Radiology for stress test      The results of significant diagnostics from this hospitalization (including imaging, microbiology, ancillary and laboratory) are listed below for reference.    Significant Diagnostic Studies: Dg Chest 2 View  04/24/2015  CLINICAL DATA:  Chest pain EXAM: CHEST  2 VIEW COMPARISON:  04/27/2014 FINDINGS: COPD with pulmonary hyperinflation. Heart size is normal. Coronary stents noted. Negative for heart failure. Lungs are clear without infiltrate or effusion. No mass or adenopathy. No acute skeletal abnormality. IMPRESSION: COPD.  No acute cardiopulmonary abnormality Coronary stents. Electronically Signed   By: Franchot Gallo M.D.   On: 04/24/2015 16:25    Microbiology: Recent Results (from the past 240 hour(s))  MRSA PCR  Screening     Status: None   Collection Time: 04/24/15 11:40 PM  Result Value Ref Range Status   MRSA by PCR NEGATIVE NEGATIVE Final    Comment:        The GeneXpert MRSA Assay (FDA approved for NASAL specimens only), is one component of a comprehensive MRSA colonization surveillance program. It is not intended to diagnose MRSA infection nor to guide or monitor treatment for MRSA infections.      Labs: Basic Metabolic Panel:  Recent Labs Lab 04/24/15 1740  NA 128*  K 4.1  CL 92*  CO2 27  GLUCOSE 97  BUN 12  CREATININE 0.97  CALCIUM 9.1   Liver Function Tests:  Recent Labs Lab 04/24/15 1740  AST 20  ALT 15  ALKPHOS 77  BILITOT 0.7  PROT 7.8  ALBUMIN 4.5   CBC:  Recent Labs Lab 04/24/15 1740  WBC 9.5  HGB 12.8  HCT 37.3  MCV 93.5  PLT 288   Cardiac Enzymes:  Recent Labs Lab 04/24/15 1740 04/24/15 2050 04/25/15 0352 04/25/15 0855  TROPONINI <0.03 <0.03 <0.03 <0.03   BNP: BNP (last 3 results)  Recent Labs  04/24/15 1740  BNP 6.0    Signed:  Barton Dubois MD.  [facility] 04/25/2015, 10:30 AM

## 2015-04-29 ENCOUNTER — Encounter: Payer: Self-pay | Admitting: Cardiovascular Disease

## 2015-05-01 ENCOUNTER — Inpatient Hospital Stay (HOSPITAL_COMMUNITY): Admit: 2015-05-01 | Payer: Medicare Other

## 2015-05-01 ENCOUNTER — Encounter (HOSPITAL_COMMUNITY): Admission: RE | Admit: 2015-05-01 | Payer: Medicare Other | Source: Ambulatory Visit

## 2015-05-01 ENCOUNTER — Encounter (HOSPITAL_COMMUNITY): Payer: Medicare Other

## 2015-05-01 ENCOUNTER — Encounter (HOSPITAL_COMMUNITY)
Admission: RE | Admit: 2015-05-01 | Discharge: 2015-05-01 | Disposition: A | Discharge status: Home / Self Care | Payer: Medicare Other | Source: Ambulatory Visit | Attending: Adult Health | Admitting: Adult Health

## 2015-05-01 DIAGNOSIS — I2510 Atherosclerotic heart disease of native coronary artery without angina pectoris: Secondary | ICD-10-CM

## 2015-05-03 ENCOUNTER — Encounter (HOSPITAL_COMMUNITY)
Admission: RE | Admit: 2015-05-03 | Discharge: 2015-05-03 | Disposition: A | Discharge status: Home / Self Care | Payer: Medicare Other | Source: Ambulatory Visit | Attending: Adult Health | Admitting: Adult Health

## 2015-05-03 ENCOUNTER — Inpatient Hospital Stay (HOSPITAL_COMMUNITY): Admission: RE | Admit: 2015-05-03 | Payer: Medicare Other | Source: Ambulatory Visit

## 2015-05-03 ENCOUNTER — Encounter (HOSPITAL_COMMUNITY): Payer: Self-pay

## 2015-05-03 DIAGNOSIS — I510 Cardiac septal defect, acquired: Secondary | ICD-10-CM | POA: Diagnosis not present

## 2015-05-03 DIAGNOSIS — I2510 Atherosclerotic heart disease of native coronary artery without angina pectoris: Secondary | ICD-10-CM | POA: Insufficient documentation

## 2015-05-03 LAB — NM MYOCAR MULTI W/SPECT W/WALL MOTION / EF
CHL CUP RESTING HR STRESS: 59 {beats}/min
LHR: 0.28
LV sys vol: 25 mL
LVDIAVOL: 49 mL (ref 46–106)
NUC STRESS TID: 0.75
Peak HR: 102 {beats}/min
SDS: 7
SRS: 3
SSS: 10

## 2015-05-03 MED ORDER — REGADENOSON 0.4 MG/5ML IV SOLN
INTRAVENOUS | Status: AC
  Administered 2015-05-03: 0.4 mg
  Filled 2015-05-03: qty 5

## 2015-05-03 MED ORDER — TECHNETIUM TC 99M SESTAMIBI GENERIC - CARDIOLITE
10.0000 | Freq: Once | INTRAVENOUS | Status: AC | PRN
  Administered 2015-05-03: 10 via INTRAVENOUS

## 2015-05-03 MED ORDER — TECHNETIUM TC 99M SESTAMIBI - CARDIOLITE
30.0000 | Freq: Once | INTRAVENOUS | Status: AC | PRN
  Administered 2015-05-03: 11:00:00 30 via INTRAVENOUS

## 2015-05-06 ENCOUNTER — Encounter: Payer: Self-pay | Admitting: *Deleted

## 2015-05-06 ENCOUNTER — Ambulatory Visit (INDEPENDENT_AMBULATORY_CARE_PROVIDER_SITE_OTHER): Payer: Medicare Other | Admitting: Cardiovascular Disease

## 2015-05-06 ENCOUNTER — Encounter: Payer: Self-pay | Admitting: Cardiovascular Disease

## 2015-05-06 VITALS — BP 136/66 | HR 72 | Ht 61.0 in | Wt 126.2 lb

## 2015-05-06 DIAGNOSIS — I2510 Atherosclerotic heart disease of native coronary artery without angina pectoris: Secondary | ICD-10-CM | POA: Diagnosis not present

## 2015-05-06 DIAGNOSIS — I10 Essential (primary) hypertension: Secondary | ICD-10-CM | POA: Diagnosis not present

## 2015-05-06 DIAGNOSIS — E785 Hyperlipidemia, unspecified: Secondary | ICD-10-CM

## 2015-05-06 DIAGNOSIS — I25110 Atherosclerotic heart disease of native coronary artery with unstable angina pectoris: Secondary | ICD-10-CM

## 2015-05-06 LAB — CBC
HCT: 36.1 % (ref 35.0–45.0)
Hemoglobin: 12.2 g/dL (ref 11.7–15.5)
MCH: 31.6 pg (ref 27.0–33.0)
MCHC: 33.8 g/dL (ref 32.0–36.0)
MCV: 93.5 fL (ref 80.0–100.0)
MPV: 9.3 fL (ref 7.5–12.5)
Platelets: 259 K/uL (ref 140–400)
RBC: 3.86 MIL/uL (ref 3.80–5.10)
RDW: 13.2 % (ref 11.0–15.0)
WBC: 5.8 K/uL (ref 3.8–10.8)

## 2015-05-06 LAB — BASIC METABOLIC PANEL
BUN: 6 mg/dL — ABNORMAL LOW (ref 7–25)
CALCIUM: 9.4 mg/dL (ref 8.6–10.4)
CHLORIDE: 92 mmol/L — AB (ref 98–110)
CO2: 28 mmol/L (ref 20–31)
CREATININE: 0.88 mg/dL (ref 0.60–0.93)
Glucose, Bld: 83 mg/dL (ref 65–99)
Potassium: 3.9 mmol/L (ref 3.5–5.3)
SODIUM: 131 mmol/L — AB (ref 135–146)

## 2015-05-06 LAB — PROTIME-INR
INR: 0.94 (ref <1.50)
PROTHROMBIN TIME: 12.7 s (ref 11.6–15.2)

## 2015-05-06 MED ORDER — ISOSORBIDE MONONITRATE ER 30 MG PO TB24: 30.0000 mg | ORAL_TABLET | Freq: Every day | ORAL | Status: DC

## 2015-05-06 MED ORDER — CLOPIDOGREL BISULFATE 75 MG PO TABS: ORAL_TABLET | ORAL | Status: DC

## 2015-05-06 MED ORDER — NITROGLYCERIN 0.4 MG SL SUBL: 0.4000 mg | SUBLINGUAL_TABLET | SUBLINGUAL | Status: DC | PRN

## 2015-05-06 NOTE — Progress Notes (Signed)
Chief Complaint  Patient presents with  . Follow-up    pt c/o sob      History of Present Illness: 75 yo female with history of CAD, HTN, HLD, anxiety, Barrett's esophagus who is here today for cardiac follow up. I saw her as a new patient 10/14/11 for evaluation of chest pain. She had been seen by Dr. Chase Caller prior to her first visit here and her CT chest showed calcifications in the left main and all three major coronary arteries. I arranged a cardiac cath on 10/16/11. She was found to have a moderate 70% stenosis in the first obtuse marginal branch and a 90% stenosis in the mid LAD. FFR of the obtuse marginal stenosis was 0.98 and following infusion of adenosine was 0.89 suggesting this stenosis was not flow limiting. I could not navigate the flow wire into the LAD. This stenosis appeared critical. I then placed overlapping stents in the mid LAD. (2.5 x 24 mm Promus Element with more proximal overlapping 2.75 x 8 mm Promus Element DES). She did well following the procedure. She had recurrence of chest pain and was seen by Truitt Merle, NP on 02/25/12 and had c/o worsened dypsnea. I arranged a repeat cath on 03/07/12 and a 2.75 x 12 mm Promus Premier DES was placed in the OM branch. I saw her on 11/30/12 and she had c/o fatigue and SOB. Stress myoview without ischemia on images but she did have EKG changes suggestive of ischemia during exercise. Cardiac cath 12/14/12 with patent stents, moderate disease. IVUS of left main and proximal LAD confirmed mild plaque disease. Repeat cath April 2016 with stable CAD. She was admitted to [facility] 04/25/15 with dyspnea, chest pain with moderate exertion, fatigue. Troponin negative x 3. No ischemic EKG changes. Nuclear stress test 05/03/15 was low risk with LVEF=50%, possible inferior wall attenuation defect, no ischemia.   She is here today for follow up. She continues to have dyspnea, chest pressure with minimal exertion. This has changed over the last  month. Review of records shows that she has COPD and this was documented by PFTs in 2013. Recent chest x-ray April 24, 2015 with hyperexpanded lungs c/w COPD. She has seen Dr. Chase Caller in the past, last in 2013. She has no dizziness, near syncope or syncope. No LE edema. She has not smoked in years.   Primary Care Physician: Netta Cedars (retired but no new Dr. Loreli Dollar)   Past Medical History  Diagnosis Date  . Hyperlipidemia   . Essential hypertension   . Anxiety   . Diverticulosis   . Barrett's esophagus without dysplasia   . Rectocele   . CAD (coronary artery disease)     a. LHC 10/16/11: dLM 20%, mLAD 90%, pOM1 30%, mOM1 70% (FFR not hemodynamically significant), prox and mid RCA 30%, EF 65%;  b. PCI 10/16/11: Promus DES x 2 to mLAD  . COPD (chronic obstructive pulmonary disease) ([place])   . Depression   . GERD (gastroesophageal reflux disease)     Past Surgical History  Procedure Laterality Date  . Partial hysterectomy  1980s  . Colonoscopy  01/2008    Multiple diverticula in desc and sigm colon  . Esophagogastroduodenoscopy  01/2008    Barrett's esophagus, gastritis, no h.pylori, due follow-up 01/2011  . Flexible sigmoidoscopy  08/11/2010 RECTAL PAIN/PRESSURE    SML IH, TICS  . Esophagogastroduodenoscopy  04/06/2011    Procedure: ESOPHAGOGASTRODUODENOSCOPY (EGD);  Surgeon: Danie Binder, MD;  Location: [facility];  Service:  Endoscopy;  Laterality: N/A;  8:30  . Abdominal hysterectomy    . Cardiac catheterization    . Fractional flow reserve wire N/A 10/16/2011    Procedure: FRACTIONAL FLOW RESERVE WIRE;  Surgeon: [name], MD;  Location: [facility] CATH LAB;  Service: Cardiovascular;  Laterality: N/A;  . Left heart catheterization with coronary angiogram N/A 03/07/2012    Procedure: LEFT HEART CATHETERIZATION WITH CORONARY ANGIOGRAM;  Surgeon: [name], MD;  Location: [facility] CATH LAB;  Service: Cardiovascular;  Laterality: N/A;  . Left heart catheterization with  coronary angiogram N/A 12/14/2012    Procedure: LEFT HEART CATHETERIZATION WITH CORONARY ANGIOGRAM;  Surgeon: [name], MD;  Location: [facility] CATH LAB;  Service: Cardiovascular;  Laterality: N/A;  . Left heart catheterization with coronary angiogram N/A 05/07/2014    Procedure: LEFT HEART CATHETERIZATION WITH CORONARY ANGIOGRAM;  Surgeon: [name], MD;  Location: [facility] CATH LAB;  Service: Cardiovascular;  Laterality: N/A;    Current Outpatient Prescriptions  Medication Sig Dispense Refill  . ALPRAZolam (XANAX) 0.25 MG tablet Take 0.25 mg by mouth daily as needed. For anxiety    . aspirin 81 MG chewable tablet Chew 81 mg by mouth daily.    Marland Kitchen atorvastatin (LIPITOR) 10 MG tablet Take 10 mg by mouth at bedtime.     . Biotin 5000 MCG CAPS Take 1 capsule by mouth daily.     . bisoprolol (ZEBETA) 5 MG tablet Take 1 tablet (5 mg total) by mouth daily. 30 tablet 1  . Calcium Carbonate-Vitamin D (CALCIUM + D) 600-200 MG-UNIT TABS Take 1 tablet by mouth 2 (two) times daily.     . clopidogrel (PLAVIX) 75 MG tablet Take 1 tablet (75 mg total) by mouth daily with  breakfast. 90 tablet 3  . diphenhydramine-acetaminophen (TYLENOL PM) 25-500 MG TABS Take 2 tablets by mouth at bedtime as needed (Sleep).    . docusate sodium (COLACE) 100 MG capsule Take 100-200 mg by mouth at bedtime.    . furosemide (LASIX) 20 MG tablet Take 1 tablet (20 mg total) by mouth daily. 30 tablet 1  . isosorbide mononitrate (IMDUR) 30 MG 24 hr tablet Take 1 tablet (30 mg total) by mouth daily. 90 tablet 3  . lisinopril (PRINIVIL,ZESTRIL) 20 MG tablet Take 1 tablet (20 mg total) by mouth daily. 30 tablet 1  . nitroGLYCERIN (NITROSTAT) 0.4 MG SL tablet Place 1 tablet (0.4 mg total) under the tongue every 5 (five) minutes x 3 doses as needed for chest pain. 75 tablet 3  . pantoprazole (PROTONIX) 20 MG tablet Take 20 mg by mouth daily.    . Polyvinyl Alcohol-Povidone (REFRESH OP) Apply 2 drops to eye daily as needed  (Scratchy eyes).    . potassium chloride SA (K-DUR,KLOR-CON) 20 MEQ tablet Take 40 mEq by mouth every morning.    . venlafaxine XR (EFFEXOR-XR) 37.5 MG 24 hr capsule Take 37.5 mg by mouth at bedtime.    . vitamin B-12 (CYANOCOBALAMIN) 1000 MCG tablet Take 2,000 mcg by mouth daily.    . [DISCONTINUED] niacin (NIASPAN) 500 MG CR tablet Take 500 mg by mouth at bedtime.       No current facility-administered medications for this visit.    Allergies  Allergen Reactions  . Adhesive [Tape] Other (See Comments)    Turns skin red  . Sulfa Antibiotics Other (See Comments)    Childhood allergy    Social History   Social History  . Marital Status: Married    Spouse Name:  N/A  . Number of Children: 1  . Years of Education: N/A   Occupational History  . 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     Social History Main Topics  . Smoking status: Former Smoker -- 1.00 packs/day for 53 years    Types: Cigarettes    Quit date: 03/20/2007  . Smokeless tobacco: Never Used  . Alcohol Use: No  . Drug Use: No  . Sexual Activity: Yes   Other Topics Concern  . Not on file   Social History Narrative    Family History  Problem Relation Age of Onset  . Coronary artery disease Father 12  . Heart attack Father 27  . Colon cancer Neg Hx   . GI problems Mother     Perforated colon   . Coronary artery disease Paternal Aunt   . Coronary artery disease Paternal Uncle     Review of Systems:  As stated in the HPI and otherwise negative.   BP 136/66 mmHg  Pulse 72  Ht '5\' 1"'$  (1.549 m)  Wt 126 lb 3.2 oz (57.244 kg)  BMI 23.86 kg/m2  Physical Examination: General: Well developed, well nourished, NAD HEENT: OP clear, mucus membranes moist SKIN: warm, dry. No rashes. Neuro: No focal deficits Musculoskeletal: Muscle strength 5/5 all ext Psychiatric: Mood and affect normal Neck: No JVD, no carotid bruits, no thyromegaly, no lymphadenopathy. Lungs:Clear bilaterally, no wheezes, rhonci,  crackles Cardiovascular: Regular rate and rhythm. No murmurs, gallops or rubs. Abdomen:Soft. Bowel sounds present. Non-tender.  Extremities: No lower extremity edema. Pulses are 2 + in the bilateral DP/PT.  Cardiac Cath April 2016: Left main: The distal vessel has 30% stenosis. This was closely examined in multiple views and did not appear to significant.  Left Anterior Descending Artery: Moderate sized vessel that courses to the apex. The ostium has a 30-40% stenosis. The proximal vessel has diffuse plaque. There are patent overlapping stents in the mid segment with 30% restenosis in the distal stented segment (unchanged from last cath). There is calcification in the proximal and mid vessel. The diagonal branch is very small.  Circumflex Artery: Moderate sized vessel with moderate sized obtuse marginal branch. The first obtuse marginal branch has 30% proximal stenosis and a patent stent in the mid segment with no restenosis. The AV groove Circumflex is small in caliber beyond the takeoff of the marginal branch. Moderate caliber intermediate branch with mild diffuse plaque.  Right Coronary Artery: Moderate sized dominant vessel with ostial 20% stenosis, diffuse 30% stenosis in the proximal and mid vessel. No flow limiting lesions noted.  Left Ventricular Angiogram: LVEF=65-70%.   Nuclear stress test 05/03/15:  Defect 1: There is a medium defect of moderate severity present in the basal inferoseptal and mid inferoseptal location. While soft tissue attenuation artifact is likely, scar cannot entirely be excluded. There is no ischemia.  Nuclear stress EF: 50%.  This is a low risk study.  There was no ST segment deviation noted during stress.  EKG:  EKG is not ordered today. The ekg ordered today demonstrates   Recent Labs: 04/24/2015: ALT 15; B Natriuretic Peptide 6.0; BUN 12; Creatinine, Ser 0.97; Hemoglobin 12.8; Platelets 288; Potassium 4.1; Sodium 128*; TSH 0.467   Lipid Panel No results  found for: CHOL, TRIG, HDL, CHOLHDL, VLDL, LDLCALC, LDLDIRECT   Wt Readings from Last 3 Encounters:  05/06/15 126 lb 3.2 oz (57.244 kg)  04/24/15 125 lb 3.5 oz (56.8 kg)  01/11/15 130 lb (58.968 kg)  Other studies Reviewed: Additional studies/ records that were reviewed today include: . Review of the above records demonstrates:    Assessment and Plan:   1. CAD with unstable angina: Recent symptoms of dyspnea and chest pain are worrisome for unstable angina. The stress test is low risk but her symptoms are the same as she had before her LAD stents were placed. This could also be a manifestation of her lung disease. Will arrange right and left heart cath with possible PCI at [facility] 05/15/15. Risks and benefits of cath reviewed with pt. Pre-cath labs today. Continue current meds including ASA/Plavix. No beta blocker secondary to bradycardia. She is on Imdur.   2. HTN: BP well controlled. No changes.   3. HLD: Continue statin. Lipids followed in primary care.   Current medicines are reviewed at length with the patient today.  The patient does not have concerns regarding medicines.  The following changes have been made:  no change  Labs/ tests ordered today include:   Orders Placed This Encounter  Procedures  . CBC  . INR/PT  . Basic Metabolic Panel (BMET)     Disposition:   FU with me after the cath    Signed, Lauree Chandler, MD 05/06/2015 11:06 AM    [address], [place], [place]  [address] Phone: [phone]; Fax: [phone]

## 2015-05-06 NOTE — Patient Instructions (Addendum)
Medication Instructions:   Your physician recommends that you continue on your current medications as directed. Please refer to the Current Medication list given to you today.   Labwork: Lab work to be done today--BMP, CBC, PT  Testing/Procedures: Your physician has requested that you have a cardiac catheterization. Cardiac catheterization is used to diagnose and/or treat various heart conditions. Doctors may recommend this procedure for a number of different reasons. The most common reason is to evaluate chest pain. Chest pain can be a symptom of coronary artery disease (CAD), and cardiac catheterization can show whether plaque is narrowing or blocking your heart's arteries. This procedure is also used to evaluate the valves, as well as measure the blood flow and oxygen levels in different parts of your heart. For further information please visit [email or web address]. Please follow instruction sheet, as given. Scheduled for April 26,2017    Follow-Up:  Your physician recommends that you schedule a follow-up appointment in: about 6 weeks. --Scheduled for May 26,2017 at 3:00 Any Other Special Instructions Will Be Listed Below (If Applicable).     If you need a refill on your cardiac medications before your next appointment, please call your pharmacy.

## 2015-05-09 ENCOUNTER — Telehealth: Payer: Self-pay | Admitting: Cardiovascular Disease

## 2015-05-09 NOTE — Telephone Encounter (Signed)
New Message  Pt requested to speak w/ RN concerning her upcoming cath. Pt requested a call back after 3pm today- she will be out of her home.  Please call back and discuss.

## 2015-05-09 NOTE — Telephone Encounter (Signed)
Spoke with pt. She is calling to cancel cath.  She reports she had the same symptoms last year at this time and cath was OK.  Thinks symptoms may be related to allergies.  She reports she is currently feeling fine.  No chest pain or shortness of breath on Tuesday, Wednesday or today.   Pt has post cath appt scheduled for May 26,2017.  Will review with Dr. Angelena Form regarding follow up.

## 2015-05-10 NOTE — Telephone Encounter (Signed)
Spoke with pt. She will plan on keeping appt for May 26th but will call to cancel if feeling well and will schedule appt for later this summer. Cath lab notified to cancel cath.

## 2015-05-10 NOTE — Telephone Encounter (Signed)
OK - thanks

## 2015-05-15 ENCOUNTER — Ambulatory Visit (HOSPITAL_COMMUNITY): Admission: RE | Admit: 2015-05-15 | Payer: Medicare Other | Source: Ambulatory Visit | Admitting: Cardiovascular Disease

## 2015-05-15 ENCOUNTER — Encounter (HOSPITAL_COMMUNITY): Admission: RE | Payer: Self-pay | Source: Ambulatory Visit

## 2015-05-15 SURGERY — RIGHT/LEFT HEART CATH AND CORONARY ANGIOGRAPHY

## 2015-06-14 ENCOUNTER — Ambulatory Visit (INDEPENDENT_AMBULATORY_CARE_PROVIDER_SITE_OTHER): Payer: Medicare Other | Admitting: Cardiovascular Disease

## 2015-06-14 ENCOUNTER — Encounter: Payer: Self-pay | Admitting: Cardiovascular Disease

## 2015-06-14 ENCOUNTER — Encounter: Payer: Self-pay | Admitting: *Deleted

## 2015-06-14 VITALS — BP 110/78 | HR 70 | Ht 61.5 in | Wt 125.8 lb

## 2015-06-14 DIAGNOSIS — E785 Hyperlipidemia, unspecified: Secondary | ICD-10-CM | POA: Diagnosis not present

## 2015-06-14 DIAGNOSIS — I10 Essential (primary) hypertension: Secondary | ICD-10-CM

## 2015-06-14 DIAGNOSIS — I25110 Atherosclerotic heart disease of native coronary artery with unstable angina pectoris: Secondary | ICD-10-CM

## 2015-06-14 DIAGNOSIS — I2510 Atherosclerotic heart disease of native coronary artery without angina pectoris: Secondary | ICD-10-CM

## 2015-06-14 LAB — CBC
HCT: 39 % (ref 35.0–45.0)
Hemoglobin: 13.2 g/dL (ref 11.7–15.5)
MCH: 31.7 pg (ref 27.0–33.0)
MCHC: 33.8 g/dL (ref 32.0–36.0)
MCV: 93.5 fL (ref 80.0–100.0)
MPV: 9.1 fL (ref 7.5–12.5)
PLATELETS: 326 10*3/uL (ref 140–400)
RBC: 4.17 MIL/uL (ref 3.80–5.10)
RDW: 12.9 % (ref 11.0–15.0)
WBC: 10 10*3/uL (ref 3.8–10.8)

## 2015-06-14 LAB — BASIC METABOLIC PANEL
BUN: 7 mg/dL (ref 7–25)
CALCIUM: 9.9 mg/dL (ref 8.6–10.4)
CHLORIDE: 88 mmol/L — AB (ref 98–110)
CO2: 27 mmol/L (ref 20–31)
CREATININE: 0.95 mg/dL — AB (ref 0.60–0.93)
Glucose, Bld: 91 mg/dL (ref 65–99)
Potassium: 4.3 mmol/L (ref 3.5–5.3)
Sodium: 128 mmol/L — ABNORMAL LOW (ref 135–146)

## 2015-06-14 NOTE — Progress Notes (Signed)
Chief Complaint  Patient presents with  . Follow-up  . Chest Pain     History of Present Illness: 75 yo female with history of CAD, HTN, HLD, anxiety, Barrett's esophagus who is here today for cardiac follow up. I saw her as a new patient 10/14/11 for evaluation of chest pain. She had been seen by Dr. Chase Caller prior to her first visit here and her CT chest showed calcifications in the left main and all three major coronary arteries. I arranged a cardiac cath on 10/16/11. She was found to have a moderate 70% stenosis in the first obtuse marginal branch and a 90% stenosis in the mid LAD. FFR of the obtuse marginal stenosis was 0.98 and following infusion of adenosine was 0.89 suggesting this stenosis was not flow limiting. I could not navigate the flow wire into the LAD. This stenosis appeared critical. I then placed overlapping stents in the mid LAD. (2.5 x 24 mm Promus Element with more proximal overlapping 2.75 x 8 mm Promus Element DES). She did well following the procedure. She had recurrence of chest pain and was seen by Truitt Merle, NP on 02/25/12 and had c/o worsened dypsnea. I arranged a repeat cath on 03/07/12 and a 2.75 x 12 mm Promus Premier DES was placed in the OM branch. I saw her on 11/30/12 and she had c/o fatigue and SOB. Stress myoview without ischemia on images but she did have EKG changes suggestive of ischemia during exercise. Cardiac cath 12/14/12 with patent stents, moderate disease. IVUS of left main and proximal LAD confirmed mild plaque disease. Repeat cath April 2016 with stable CAD. She was admitted to [facility]/25/15 with dyspnea, chest pain with moderate exertion, fatigue. Troponin negative x 3. No ischemic EKG changes. Nuclear stress test 05/03/15 was low risk with LVEF=50%, possible inferior wall attenuation defect, no ischemia. I saw her in April 2017 and she continue to have dyspnea and chest pressure. I arranged a cardiac cath but she cancelled. She is known to  have COPD and had seen Pulmonary in the past.   She is here today for follow up. She continues to have dyspnea, chest pressure with minimal exertion. She has no dizziness, near syncope or syncope. No LE edema. She has not smoked in years.   Primary Care Physician: Netta Cedars (retired but no new Dr. Loreli Dollar)   Past Medical History  Diagnosis Date  . Hyperlipidemia   . Essential hypertension   . Anxiety   . Diverticulosis   . Barrett's esophagus without dysplasia   . Rectocele   . CAD (coronary artery disease)     a. LHC 10/16/11: dLM 20%, mLAD 90%, pOM1 30%, mOM1 70% (FFR not hemodynamically significant), prox and mid RCA 30%, EF 65%;  b. PCI 10/16/11: Promus DES x 2 to mLAD  . COPD (chronic obstructive pulmonary disease) ([place])   . Depression   . GERD (gastroesophageal reflux disease)     Past Surgical History  Procedure Laterality Date  . Partial hysterectomy  1980s  . Colonoscopy  01/2008    Multiple diverticula in desc and sigm colon  . Esophagogastroduodenoscopy  01/2008    Barrett's esophagus, gastritis, no h.pylori, due follow-up 01/2011  . Flexible sigmoidoscopy  08/11/2010 RECTAL PAIN/PRESSURE    SML IH, TICS  . Esophagogastroduodenoscopy  04/06/2011    Procedure: ESOPHAGOGASTRODUODENOSCOPY (EGD);  Surgeon: Danie Binder, MD;  Location: [facility];  Service: Endoscopy;  Laterality: N/A;  8:30  . Abdominal hysterectomy    .  Cardiac catheterization    . Fractional flow reserve wire N/A 10/16/2011    Procedure: FRACTIONAL FLOW RESERVE WIRE;  Surgeon: [name], MD;  Location: [facility] CATH LAB;  Service: Cardiovascular;  Laterality: N/A;  . Left heart catheterization with coronary angiogram N/A 03/07/2012    Procedure: LEFT HEART CATHETERIZATION WITH CORONARY ANGIOGRAM;  Surgeon: [name], MD;  Location: [facility] CATH LAB;  Service: Cardiovascular;  Laterality: N/A;  . Left heart catheterization with coronary angiogram N/A 12/14/2012    Procedure: LEFT HEART  CATHETERIZATION WITH CORONARY ANGIOGRAM;  Surgeon: [name], MD;  Location: [facility] CATH LAB;  Service: Cardiovascular;  Laterality: N/A;  . Left heart catheterization with coronary angiogram N/A 05/07/2014    Procedure: LEFT HEART CATHETERIZATION WITH CORONARY ANGIOGRAM;  Surgeon: [name], MD;  Location: [facility] CATH LAB;  Service: Cardiovascular;  Laterality: N/A;    Current Outpatient Prescriptions  Medication Sig Dispense Refill  . ALPRAZolam (XANAX) 0.25 MG tablet Take 0.25 mg by mouth daily as needed. For anxiety    . aspirin 81 MG chewable tablet Chew 81 mg by mouth daily.    Marland Kitchen atorvastatin (LIPITOR) 10 MG tablet Take 10 mg by mouth at bedtime.     . Biotin 5000 MCG CAPS Take 1 capsule by mouth daily.     . bisoprolol (ZEBETA) 5 MG tablet Take 1 tablet (5 mg total) by mouth daily. 30 tablet 1  . Calcium Carbonate-Vitamin D (CALCIUM + D) 600-200 MG-UNIT TABS Take 1 tablet by mouth 2 (two) times daily.     . clopidogrel (PLAVIX) 75 MG tablet Take 1 tablet (75 mg total) by mouth daily with  breakfast. 90 tablet 3  . diphenhydramine-acetaminophen (TYLENOL PM) 25-500 MG TABS Take 2 tablets by mouth at bedtime as needed (Sleep).    . docusate sodium (COLACE) 100 MG capsule Take 100-200 mg by mouth at bedtime.    . furosemide (LASIX) 20 MG tablet Take 1 tablet (20 mg total) by mouth daily. 30 tablet 1  . isosorbide mononitrate (IMDUR) 30 MG 24 hr tablet Take 1 tablet (30 mg total) by mouth daily. 90 tablet 3  . lisinopril (PRINIVIL,ZESTRIL) 20 MG tablet Take 1 tablet (20 mg total) by mouth daily. 30 tablet 1  . nitroGLYCERIN (NITROSTAT) 0.4 MG SL tablet Place 1 tablet (0.4 mg total) under the tongue every 5 (five) minutes x 3 doses as needed for chest pain. 75 tablet 3  . pantoprazole (PROTONIX) 20 MG tablet Take 20 mg by mouth daily.    . Polyvinyl Alcohol-Povidone (REFRESH OP) Apply 2 drops to eye daily as needed (Scratchy eyes).    . potassium chloride SA (K-DUR,KLOR-CON)  20 MEQ tablet Take 40 mEq by mouth every morning.    . venlafaxine XR (EFFEXOR-XR) 37.5 MG 24 hr capsule Take 37.5 mg by mouth at bedtime.    . vitamin B-12 (CYANOCOBALAMIN) 1000 MCG tablet Take 2,000 mcg by mouth daily.    . [DISCONTINUED] niacin (NIASPAN) 500 MG CR tablet Take 500 mg by mouth at bedtime.       No current facility-administered medications for this visit.    Allergies  Allergen Reactions  . Adhesive [Tape] Other (See Comments)    Turns skin red  . Sulfa Antibiotics Other (See Comments)    Childhood allergy    Social History   Social History  . Marital Status: Married    Spouse Name: N/A  . Number of Children: 1  . Years of Education: N/A  Occupational History  . 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     Social History Main Topics  . Smoking status: Former Smoker -- 1.00 packs/day for 53 years    Types: Cigarettes    Quit date: 03/20/2007  . Smokeless tobacco: Never Used  . Alcohol Use: No  . Drug Use: No  . Sexual Activity: Yes   Other Topics Concern  . Not on file   Social History Narrative    Family History  Problem Relation Age of Onset  . Coronary artery disease Father 14  . Heart attack Father 28  . Colon cancer Neg Hx   . GI problems Mother     Perforated colon   . Coronary artery disease Paternal Aunt   . Coronary artery disease Paternal Uncle     Review of Systems:  As stated in the HPI and otherwise negative.   BP 110/78 mmHg  Pulse 70  Ht 5' 1.5" (1.562 m)  Wt 125 lb 12.8 oz (57.063 kg)  BMI 23.39 kg/m2  SpO2 97%  Physical Examination: General: Well developed, well nourished, NAD HEENT: OP clear, mucus membranes moist SKIN: warm, dry. No rashes. Neuro: No focal deficits Musculoskeletal: Muscle strength 5/5 all ext Psychiatric: Mood and affect normal Neck: No JVD, no carotid bruits, no thyromegaly, no lymphadenopathy. Lungs:Clear bilaterally, no wheezes, rhonci, crackles Cardiovascular: Regular rate and  rhythm. No murmurs, gallops or rubs. Abdomen:Soft. Bowel sounds present. Non-tender.  Extremities: No lower extremity edema. Pulses are 2 + in the bilateral DP/PT.  Cardiac Cath April 2016: Left main: The distal vessel has 30% stenosis. This was closely examined in multiple views and did not appear to significant.  Left Anterior Descending Artery: Moderate sized vessel that courses to the apex. The ostium has a 30-40% stenosis. The proximal vessel has diffuse plaque. There are patent overlapping stents in the mid segment with 30% restenosis in the distal stented segment (unchanged from last cath). There is calcification in the proximal and mid vessel. The diagonal branch is very small.  Circumflex Artery: Moderate sized vessel with moderate sized obtuse marginal branch. The first obtuse marginal branch has 30% proximal stenosis and a patent stent in the mid segment with no restenosis. The AV groove Circumflex is small in caliber beyond the takeoff of the marginal branch. Moderate caliber intermediate branch with mild diffuse plaque.  Right Coronary Artery: Moderate sized dominant vessel with ostial 20% stenosis, diffuse 30% stenosis in the proximal and mid vessel. No flow limiting lesions noted.  Left Ventricular Angiogram: LVEF=65-70%.   Nuclear stress test 05/03/15:  Defect 1: There is a medium defect of moderate severity present in the basal inferoseptal and mid inferoseptal location. While soft tissue attenuation artifact is likely, scar cannot entirely be excluded. There is no ischemia.  Nuclear stress EF: 50%.  This is a low risk study.  There was no ST segment deviation noted during stress.  EKG:  EKG is ordered today. The ekg ordered today demonstrates NSR, rate 70 bpm.   Recent Labs: 04/24/2015: ALT 15; B Natriuretic Peptide 6.0; TSH 0.467 05/06/2015: BUN 6*; Creat 0.88; Hemoglobin 12.2; Platelets 259; Potassium 3.9; Sodium 131*   Lipid Panel No results found for: CHOL, TRIG, HDL,  CHOLHDL, VLDL, LDLCALC, LDLDIRECT   Wt Readings from Last 3 Encounters:  06/14/15 125 lb 12.8 oz (57.063 kg)  05/06/15 126 lb 3.2 oz (57.244 kg)  04/24/15 125 lb 3.5 oz (56.8 kg)     Other studies Reviewed: Additional studies/  records that were reviewed today include: . Review of the above records demonstrates:    Assessment and Plan:   1. CAD with unstable angina: Recent symptoms of dyspnea and chest pain are worrisome for unstable angina. The stress test is low risk but her symptoms are the same as she had before her LAD stents were placed. This could also be a manifestation of her lung disease. Will arrange right and left heart cath with possible PCI at [facility] 06/21/15. Risks and benefits of cath reviewed with pt. Pre-cath labs today. Continue current meds including ASA/Plavix. No beta blocker secondary to bradycardia. She is on Imdur.   2. HTN: BP well controlled. No changes.   3. HLD: Continue statin. Lipids followed in primary care.   Current medicines are reviewed at length with the patient today.  The patient does not have concerns regarding medicines.  The following changes have been made:  no change  Labs/ tests ordered today include:   Orders Placed This Encounter  Procedures  . CBC  . Basic Metabolic Panel (BMET)  . INR/PT  . EKG 12-Lead     Disposition:   FU with me after the cath    Signed, Lauree Chandler, MD 06/14/2015 3:53 PM    [facility] [address] [place], [place], [place]  [address] Phone: [phone]; Fax: [phone]

## 2015-06-14 NOTE — Patient Instructions (Addendum)
Medication Instructions:  Your physician recommends that you continue on your current medications as directed. Please refer to the Current Medication list given to you today.   Labwork: Lab work to be done today--BMP, CBC, PT  Testing/Procedures: Your physician has requested that you have a cardiac catheterization. Cardiac catheterization is used to diagnose and/or treat various heart conditions. Doctors may recommend this procedure for a number of different reasons. The most common reason is to evaluate chest pain. Chest pain can be a symptom of coronary artery disease (CAD), and cardiac catheterization can show whether plaque is narrowing or blocking your heart's arteries. This procedure is also used to evaluate the valves, as well as measure the blood flow and oxygen levels in different parts of your heart. For further information please visit [email or web address]. Please follow instruction sheet, as given. Scheduled for June 2,2017    Follow-Up: Your physician recommends that you schedule a follow-up appointment in: about one month. Scheduled for July 16, 2015 at 12:15    Any Other Special Instructions Will Be Listed Below (If Applicable).     If you need a refill on your cardiac medications before your next appointment, please call your pharmacy.

## 2015-06-15 LAB — PROTIME-INR
INR: 0.94 (ref <1.50)
PROTHROMBIN TIME: 12.7 s (ref 11.6–15.2)

## 2015-06-21 ENCOUNTER — Ambulatory Visit (HOSPITAL_COMMUNITY)
Admission: RE | Admit: 2015-06-21 | Discharge: 2015-06-21 | Disposition: A | Discharge status: Home / Self Care | Payer: Medicare Other | Source: Ambulatory Visit | Attending: Cardiovascular Disease | Admitting: Cardiovascular Disease

## 2015-06-21 ENCOUNTER — Encounter (HOSPITAL_COMMUNITY)
Admission: RE | Disposition: A | Discharge status: Home / Self Care | Payer: Self-pay | Source: Ambulatory Visit | Attending: Cardiovascular Disease

## 2015-06-21 DIAGNOSIS — F419 Anxiety disorder, unspecified: Secondary | ICD-10-CM | POA: Diagnosis not present

## 2015-06-21 DIAGNOSIS — I25110 Atherosclerotic heart disease of native coronary artery with unstable angina pectoris: Secondary | ICD-10-CM | POA: Diagnosis not present

## 2015-06-21 DIAGNOSIS — R001 Bradycardia, unspecified: Secondary | ICD-10-CM | POA: Diagnosis not present

## 2015-06-21 DIAGNOSIS — Z7902 Long term (current) use of antithrombotics/antiplatelets: Secondary | ICD-10-CM | POA: Diagnosis not present

## 2015-06-21 DIAGNOSIS — I10 Essential (primary) hypertension: Secondary | ICD-10-CM | POA: Diagnosis not present

## 2015-06-21 DIAGNOSIS — F329 Major depressive disorder, single episode, unspecified: Secondary | ICD-10-CM | POA: Insufficient documentation

## 2015-06-21 DIAGNOSIS — Z882 Allergy status to sulfonamides status: Secondary | ICD-10-CM | POA: Insufficient documentation

## 2015-06-21 DIAGNOSIS — Z955 Presence of coronary angioplasty implant and graft: Secondary | ICD-10-CM | POA: Insufficient documentation

## 2015-06-21 DIAGNOSIS — R0600 Dyspnea, unspecified: Secondary | ICD-10-CM | POA: Diagnosis not present

## 2015-06-21 DIAGNOSIS — K5790 Diverticulosis of intestine, part unspecified, without perforation or abscess without bleeding: Secondary | ICD-10-CM | POA: Insufficient documentation

## 2015-06-21 DIAGNOSIS — Z8249 Family history of ischemic heart disease and other diseases of the circulatory system: Secondary | ICD-10-CM | POA: Insufficient documentation

## 2015-06-21 DIAGNOSIS — Z87891 Personal history of nicotine dependence: Secondary | ICD-10-CM | POA: Insufficient documentation

## 2015-06-21 DIAGNOSIS — K2270 Barrett's esophagus without dysplasia: Secondary | ICD-10-CM | POA: Diagnosis not present

## 2015-06-21 DIAGNOSIS — E785 Hyperlipidemia, unspecified: Secondary | ICD-10-CM | POA: Diagnosis not present

## 2015-06-21 DIAGNOSIS — Z7982 Long term (current) use of aspirin: Secondary | ICD-10-CM | POA: Insufficient documentation

## 2015-06-21 DIAGNOSIS — K219 Gastro-esophageal reflux disease without esophagitis: Secondary | ICD-10-CM | POA: Insufficient documentation

## 2015-06-21 DIAGNOSIS — J449 Chronic obstructive pulmonary disease, unspecified: Secondary | ICD-10-CM | POA: Diagnosis not present

## 2015-06-21 HISTORY — PX: CARDIAC CATHETERIZATION: SHX172

## 2015-06-21 LAB — POCT I-STAT 3, ART BLOOD GAS (G3+)
Acid-base deficit: 1 mmol/L (ref 0.0–2.0)
Bicarbonate: 23.9 mEq/L (ref 20.0–24.0)
O2 SAT: 90 %
PCO2 ART: 41.8 mmHg (ref 35.0–45.0)
TCO2: 25 mmol/L (ref 0–100)
pH, Arterial: 7.365 (ref 7.350–7.450)
pO2, Arterial: 61 mmHg — ABNORMAL LOW (ref 80.0–100.0)

## 2015-06-21 LAB — POCT I-STAT 3, VENOUS BLOOD GAS (G3P V)
BICARBONATE: 26.3 meq/L — AB (ref 20.0–24.0)
O2 Saturation: 61 %
PH VEN: 7.337 — AB (ref 7.250–7.300)
TCO2: 28 mmol/L (ref 0–100)
pCO2, Ven: 49.1 mmHg (ref 45.0–50.0)
pO2, Ven: 34 mmHg (ref 31.0–45.0)

## 2015-06-21 LAB — POCT ACTIVATED CLOTTING TIME
ACTIVATED CLOTTING TIME: 193 s
Activated Clotting Time: 183 seconds

## 2015-06-21 SURGERY — RIGHT/LEFT HEART CATH AND CORONARY ANGIOGRAPHY
Anesthesia: LOCAL

## 2015-06-21 MED ORDER — LIDOCAINE HCL (PF) 1 % IJ SOLN
INTRAMUSCULAR | Status: AC
  Filled 2015-06-21: qty 30

## 2015-06-21 MED ORDER — MIDAZOLAM HCL 2 MG/2ML IJ SOLN
INTRAMUSCULAR | Status: DC | PRN
  Administered 2015-06-21: 2 mg via INTRAVENOUS
  Administered 2015-06-21: 1 mg via INTRAVENOUS

## 2015-06-21 MED ORDER — LIDOCAINE HCL (PF) 1 % IJ SOLN
INTRAMUSCULAR | Status: DC | PRN
  Administered 2015-06-21: 15 mL
  Administered 2015-06-21: 2 mL

## 2015-06-21 MED ORDER — MIDAZOLAM HCL 2 MG/2ML IJ SOLN
INTRAMUSCULAR | Status: AC
  Filled 2015-06-21: qty 2

## 2015-06-21 MED ORDER — SODIUM CHLORIDE 0.9 % IV SOLN: 250.0000 mL | INTRAVENOUS | Status: DC | PRN

## 2015-06-21 MED ORDER — ASPIRIN 81 MG PO CHEW: 81.0000 mg | CHEWABLE_TABLET | ORAL | Status: DC

## 2015-06-21 MED ORDER — SODIUM CHLORIDE 0.9% FLUSH: 3.0000 mL | Freq: Two times a day (BID) | INTRAVENOUS | Status: DC

## 2015-06-21 MED ORDER — VERAPAMIL HCL 2.5 MG/ML IV SOLN
INTRAVENOUS | Status: AC
  Filled 2015-06-21: qty 2

## 2015-06-21 MED ORDER — HEPARIN (PORCINE) IN NACL 2-0.9 UNIT/ML-% IJ SOLN
INTRAMUSCULAR | Status: AC
  Filled 2015-06-21: qty 1000

## 2015-06-21 MED ORDER — SODIUM CHLORIDE 0.9 % IV SOLN
INTRAVENOUS | Status: DC | PRN
  Administered 2015-06-21: 10 mL/h via INTRAVENOUS

## 2015-06-21 MED ORDER — SODIUM CHLORIDE 0.9% FLUSH: 3.0000 mL | INTRAVENOUS | Status: DC | PRN

## 2015-06-21 MED ORDER — FENTANYL CITRATE (PF) 100 MCG/2ML IJ SOLN
INTRAMUSCULAR | Status: AC
  Filled 2015-06-21: qty 2

## 2015-06-21 MED ORDER — IOPAMIDOL (ISOVUE-370) INJECTION 76%
INTRAVENOUS | Status: DC | PRN
  Administered 2015-06-21: 80 mL via INTRAVENOUS

## 2015-06-21 MED ORDER — HEPARIN SODIUM (PORCINE) 1000 UNIT/ML IJ SOLN
INTRAMUSCULAR | Status: AC
  Filled 2015-06-21: qty 1

## 2015-06-21 MED ORDER — IOPAMIDOL (ISOVUE-370) INJECTION 76%
INTRAVENOUS | Status: AC
  Filled 2015-06-21: qty 100

## 2015-06-21 MED ORDER — FENTANYL CITRATE (PF) 100 MCG/2ML IJ SOLN
INTRAMUSCULAR | Status: DC | PRN
  Administered 2015-06-21 (×2): 25 ug via INTRAVENOUS

## 2015-06-21 MED ORDER — SODIUM CHLORIDE 0.9 % IV SOLN: INTRAVENOUS | Status: AC

## 2015-06-21 MED ORDER — VERAPAMIL HCL 2.5 MG/ML IV SOLN
INTRAVENOUS | Status: DC | PRN
  Administered 2015-06-21: 10 mL via INTRA_ARTERIAL

## 2015-06-21 MED ORDER — SODIUM CHLORIDE 0.9 % IV SOLN
INTRAVENOUS | Status: DC
  Administered 2015-06-21: 12:00:00 via INTRAVENOUS

## 2015-06-21 MED ORDER — HEPARIN SODIUM (PORCINE) 1000 UNIT/ML IJ SOLN
INTRAMUSCULAR | Status: DC | PRN
  Administered 2015-06-21: 3000 [IU] via INTRAVENOUS

## 2015-06-21 SURGICAL SUPPLY — 16 items
CATH BALLN WEDGE 5F 110CM (CATHETERS) ×2 IMPLANT
CATH INFINITI 5 FR JL3.5 (CATHETERS) ×2 IMPLANT
CATH INFINITI 5FR ANG PIGTAIL (CATHETERS) ×2 IMPLANT
CATH INFINITI JR4 5F (CATHETERS) ×2 IMPLANT
CATH SWAN GANZ 7F STRAIGHT (CATHETERS) ×2 IMPLANT
DEVICE RAD COMP TR BAND LRG (VASCULAR PRODUCTS) ×2 IMPLANT
GLIDESHEATH SLEND SS 6F .021 (SHEATH) ×2 IMPLANT
GUIDEWIRE .025 260CM (WIRE) ×2 IMPLANT
KIT HEART LEFT (KITS) ×2 IMPLANT
PACK CARDIAC CATHETERIZATION (CUSTOM PROCEDURE TRAY) ×2 IMPLANT
SHEATH FAST CATH BRACH 5F 5CM (SHEATH) ×2 IMPLANT
SHEATH PINNACLE 7F 10CM (SHEATH) ×2 IMPLANT
SYR MEDRAD MARK V 150ML (SYRINGE) ×2 IMPLANT
TRANSDUCER W/STOPCOCK (MISCELLANEOUS) ×2 IMPLANT
TUBING CIL FLEX 10 FLL-RA (TUBING) ×2 IMPLANT
WIRE SAFE-T 1.5MM-J .035X260CM (WIRE) ×2 IMPLANT

## 2015-06-21 NOTE — Discharge Instructions (Signed)
Radial Site Care Refer to this sheet in the next few weeks. These instructions provide you with information about caring for yourself after your procedure. Your health care provider may also give you more specific instructions. Your treatment has been planned according to current medical practices, but problems sometimes occur. Call your health care provider if you have any problems or questions after your procedure. WHAT TO EXPECT AFTER THE PROCEDURE After your procedure, it is typical to have the following:  Bruising at the radial site that usually fades within 1-2 weeks.  Blood collecting in the tissue (hematoma) that may be painful to the touch. It should usually decrease in size and tenderness within 1-2 weeks. HOME CARE INSTRUCTIONS  Take medicines only as directed by your health care provider.  You may shower 24-48 hours after the procedure or as directed by your health care provider. Remove the bandage (dressing) and gently wash the site with plain soap and water. Pat the area dry with a clean towel. Do not rub the site, because this may cause bleeding.  Do not take baths, swim, or use a hot tub until your health care provider approves.  Check your insertion site every day for redness, swelling, or drainage.  Do not apply powder or lotion to the site.  Do not flex or bend the affected arm for 24 hours or as directed by your health care provider.  Do not push or pull heavy objects with the affected arm for 24 hours or as directed by your health care provider.  Do not lift over 10 lb (4.5 kg) for 5 days after your procedure or as directed by your health care provider.  Ask your health care provider when it is okay to:  Return to work or school.  Resume usual physical activities or sports.  Resume sexual activity.  Do not drive home if you are discharged the same day as the procedure. Have someone else drive you.  You may drive 24 hours after the procedure unless otherwise  instructed by your health care provider.  Do not operate machinery or power tools for 24 hours after the procedure.  If your procedure was done as an outpatient procedure, which means that you went home the same day as your procedure, a responsible adult should be with you for the first 24 hours after you arrive home.  Keep all follow-up visits as directed by your health care provider. This is important. SEEK MEDICAL CARE IF:  You have a fever.  You have chills.  You have increased bleeding from the radial site. Hold pressure on the site. SEEK IMMEDIATE MEDICAL CARE IF:  You have unusual pain at the radial site.  You have redness, warmth, or swelling at the radial site.  You have drainage (other than a small amount of blood on the dressing) from the radial site.  The radial site is bleeding, and the bleeding does not stop after 30 minutes of holding steady pressure on the site.  Your arm or hand becomes pale, cool, tingly, or numb.   This information is not intended to replace advice given to you by your health care provider. Make sure you discuss any questions you have with your health care provider.   Document Released: 02/07/2010 Document Revised: 01/26/2014 Document Reviewed: 07/24/2013 Elsevier Interactive Patient Education 2016 [place] After Refer to this sheet in the next few weeks. These instructions provide you with information about caring for yourself after your procedure.  Your health care provider may also give you more specific instructions. Your treatment has been planned according to current medical practices, but problems sometimes occur. Call your health care provider if you have any problems or questions after your procedure. WHAT TO EXPECT AFTER THE PROCEDURE After your procedure, it is typical to have the following:  Bruising at the catheter insertion site that usually fades within 1-2 weeks.  Blood collecting in the tissue  (hematoma) that may be painful to the touch. It should usually decrease in size and tenderness within 1-2 weeks. HOME CARE INSTRUCTIONS  Take medicines only as directed by your health care provider.  You may shower 24-48 hours after the procedure or as directed by your health care provider. Remove the bandage (dressing) and gently wash the site with plain soap and water. Pat the area dry with a clean towel. Do not rub the site, because this may cause bleeding.  Do not take baths, swim, or use a hot tub until your health care provider approves.  Check your insertion site every day for redness, swelling, or drainage.  Do not apply powder or lotion to the site.  Do not lift over 10 lb (4.5 kg) for 5 days after your procedure or as directed by your health care provider.  Ask your health care provider when it is okay to:  Return to work or school.  Resume usual physical activities or sports.  Resume sexual activity.  Do not drive home if you are discharged the same day as the procedure. Have someone else drive you.  You may drive 24 hours after the procedure unless otherwise instructed by your health care provider.  Do not operate machinery or power tools for 24 hours after the procedure or as directed by your health care provider.  If your procedure was done as an outpatient procedure, which means that you went home the same day as your procedure, a responsible adult should be with you for the first 24 hours after you arrive home.  Keep all follow-up visits as directed by your health care provider. This is important. SEEK MEDICAL CARE IF:  You have a fever.  You have chills.  You have increased bleeding from the catheter insertion site. Hold pressure on the site. SEEK IMMEDIATE MEDICAL CARE IF:  You have unusual pain at the catheter insertion site.  You have redness, warmth, or swelling at the catheter insertion site.  You have drainage (other than a small amount of blood on  the dressing) from the catheter insertion site.  The catheter insertion site is bleeding, and the bleeding does not stop after 30 minutes of holding steady pressure on the site.  The area near or just beyond the catheter insertion site becomes pale, cool, tingly, or numb.   This information is not intended to replace advice given to you by your health care provider. Make sure you discuss any questions you have with your health care provider.   Document Released: 07/24/2004 Document Revised: 01/26/2014 Document Reviewed: 06/08/2012 Elsevier Interactive Patient Education [employer].

## 2015-06-21 NOTE — Interval H&P Note (Signed)
History and Physical Interval Note:  06/21/2015 11:42 AM  Rhonda Hughes  has presented today for cardiac cath with the diagnosis of unstable angina. The various methods of treatment have been discussed with the patient and family. After consideration of risks, benefits and other options for treatment, the patient has consented to  Procedure(s): Right/Left Heart Cath and Coronary Angiography (N/A) as a surgical intervention .  The patient's history has been reviewed, patient examined, no change in status, stable for surgery.  I have reviewed the patient's chart and labs.  Questions were answered to the patient's satisfaction.     Lauree Chandler

## 2015-06-21 NOTE — Progress Notes (Addendum)
Site area: RFV Site Prior to Removal:  Level 0 Pressure Applied For:15 min Manual:  yes  Patient Status During Pull:stable   Post Pull Site:  Level 0 Post Pull Instructions Given:  yes Post Pull Pulses Present:  Dressing Applied:tegaderm   Bedrest begins @ 1410 till 1710 Comments:

## 2015-06-21 NOTE — H&P (View-Only) (Signed)
Chief Complaint  Patient presents with  . Follow-up  . Chest Pain     History of Present Illness: 75 yo female with history of CAD, HTN, HLD, anxiety, Barrett's esophagus who is here today for cardiac follow up. I saw her as a new patient 10/14/11 for evaluation of chest pain. She had been seen by Dr. Chase Caller prior to her first visit here and her CT chest showed calcifications in the left main and all three major coronary arteries. I arranged a cardiac cath on 10/16/11. She was found to have a moderate 70% stenosis in the first obtuse marginal branch and a 90% stenosis in the mid LAD. FFR of the obtuse marginal stenosis was 0.98 and following infusion of adenosine was 0.89 suggesting this stenosis was not flow limiting. I could not navigate the flow wire into the LAD. This stenosis appeared critical. I then placed overlapping stents in the mid LAD. (2.5 x 24 mm Promus Element with more proximal overlapping 2.75 x 8 mm Promus Element DES). She did well following the procedure. She had recurrence of chest pain and was seen by Truitt Merle, NP on 02/25/12 and had c/o worsened dypsnea. I arranged a repeat cath on 03/07/12 and a 2.75 x 12 mm Promus Premier DES was placed in the OM branch. I saw her on 11/30/12 and she had c/o fatigue and SOB. Stress myoview without ischemia on images but she did have EKG changes suggestive of ischemia during exercise. Cardiac cath 12/14/12 with patent stents, moderate disease. IVUS of left main and proximal LAD confirmed mild plaque disease. Repeat cath April 2016 with stable CAD. She was admitted to [facility] 04/25/15 with dyspnea, chest pain with moderate exertion, fatigue. Troponin negative x 3. No ischemic EKG changes. Nuclear stress test 05/03/15 was low risk with LVEF=50%, possible inferior wall attenuation defect, no ischemia. I saw her in April 2017 and she continue to have dyspnea and chest pressure. I arranged a cardiac cath but she cancelled. She is known to  have COPD and had seen Pulmonary in the past.   She is here today for follow up. She continues to have dyspnea, chest pressure with minimal exertion. She has no dizziness, near syncope or syncope. No LE edema. She has not smoked in years.   Primary Care Physician: Netta Cedars (retired but no new Dr. Loreli Dollar)   Past Medical History  Diagnosis Date  . Hyperlipidemia   . Essential hypertension   . Anxiety   . Diverticulosis   . Barrett's esophagus without dysplasia   . Rectocele   . CAD (coronary artery disease)     a. LHC 10/16/11: dLM 20%, mLAD 90%, pOM1 30%, mOM1 70% (FFR not hemodynamically significant), prox and mid RCA 30%, EF 65%;  b. PCI 10/16/11: Promus DES x 2 to mLAD  . COPD (chronic obstructive pulmonary disease) ([place])   . Depression   . GERD (gastroesophageal reflux disease)     Past Surgical History  Procedure Laterality Date  . Partial hysterectomy  1980s  . Colonoscopy  01/2008    Multiple diverticula in desc and sigm colon  . Esophagogastroduodenoscopy  01/2008    Barrett's esophagus, gastritis, no h.pylori, due follow-up 01/2011  . Flexible sigmoidoscopy  08/11/2010 RECTAL PAIN/PRESSURE    SML IH, TICS  . Esophagogastroduodenoscopy  04/06/2011    Procedure: ESOPHAGOGASTRODUODENOSCOPY (EGD);  Surgeon: Danie Binder, MD;  [facility];  Service: Endoscopy;  Laterality: N/A;  8:30  . Abdominal hysterectomy    .  Cardiac catheterization    . Fractional flow reserve wire N/A 10/16/2011    Procedure: FRACTIONAL FLOW RESERVE WIRE;  Surgeon: [name], MD;  Location: [facility] CATH LAB;  Service: Cardiovascular;  Laterality: N/A;  . Left heart catheterization with coronary angiogram N/A 03/07/2012    Procedure: LEFT HEART CATHETERIZATION WITH CORONARY ANGIOGRAM;  Surgeon: [name], MD;  Location: [facility] CATH LAB;  Service: Cardiovascular;  Laterality: N/A;  . Left heart catheterization with coronary angiogram N/A 12/14/2012    Procedure: LEFT HEART  CATHETERIZATION WITH CORONARY ANGIOGRAM;  Surgeon: [name], MD;  Location: [facility] CATH LAB;  Service: Cardiovascular;  Laterality: N/A;  . Left heart catheterization with coronary angiogram N/A 05/07/2014    Procedure: LEFT HEART CATHETERIZATION WITH CORONARY ANGIOGRAM;  Surgeon: [name], MD;  Location: [facility] CATH LAB;  Service: Cardiovascular;  Laterality: N/A;    Current Outpatient Prescriptions  Medication Sig Dispense Refill  . ALPRAZolam (XANAX) 0.25 MG tablet Take 0.25 mg by mouth daily as needed. For anxiety    . aspirin 81 MG chewable tablet Chew 81 mg by mouth daily.    Marland Kitchen atorvastatin (LIPITOR) 10 MG tablet Take 10 mg by mouth at bedtime.     . Biotin 5000 MCG CAPS Take 1 capsule by mouth daily.     . bisoprolol (ZEBETA) 5 MG tablet Take 1 tablet (5 mg total) by mouth daily. 30 tablet 1  . Calcium Carbonate-Vitamin D (CALCIUM + D) 600-200 MG-UNIT TABS Take 1 tablet by mouth 2 (two) times daily.     . clopidogrel (PLAVIX) 75 MG tablet Take 1 tablet (75 mg total) by mouth daily with  breakfast. 90 tablet 3  . diphenhydramine-acetaminophen (TYLENOL PM) 25-500 MG TABS Take 2 tablets by mouth at bedtime as needed (Sleep).    . docusate sodium (COLACE) 100 MG capsule Take 100-200 mg by mouth at bedtime.    . furosemide (LASIX) 20 MG tablet Take 1 tablet (20 mg total) by mouth daily. 30 tablet 1  . isosorbide mononitrate (IMDUR) 30 MG 24 hr tablet Take 1 tablet (30 mg total) by mouth daily. 90 tablet 3  . lisinopril (PRINIVIL,ZESTRIL) 20 MG tablet Take 1 tablet (20 mg total) by mouth daily. 30 tablet 1  . nitroGLYCERIN (NITROSTAT) 0.4 MG SL tablet Place 1 tablet (0.4 mg total) under the tongue every 5 (five) minutes x 3 doses as needed for chest pain. 75 tablet 3  . pantoprazole (PROTONIX) 20 MG tablet Take 20 mg by mouth daily.    . Polyvinyl Alcohol-Povidone (REFRESH OP) Apply 2 drops to eye daily as needed (Scratchy eyes).    . potassium chloride SA (K-DUR,KLOR-CON)  20 MEQ tablet Take 40 mEq by mouth every morning.    . venlafaxine XR (EFFEXOR-XR) 37.5 MG 24 hr capsule Take 37.5 mg by mouth at bedtime.    . vitamin B-12 (CYANOCOBALAMIN) 1000 MCG tablet Take 2,000 mcg by mouth daily.    . [DISCONTINUED] niacin (NIASPAN) 500 MG CR tablet Take 500 mg by mouth at bedtime.       No current facility-administered medications for this visit.    Allergies  Allergen Reactions  . Adhesive [Tape] Other (See Comments)    Turns skin red  . Sulfa Antibiotics Other (See Comments)    Childhood allergy    Social History   Social History  . Marital Status: Married    Spouse Name: N/A  . Number of Children: 1  . Years of Education: N/A  Occupational History  . 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     Social History Main Topics  . Smoking status: Former Smoker -- 1.00 packs/day for 53 years    Types: Cigarettes    Quit date: 03/20/2007  . Smokeless tobacco: Never Used  . Alcohol Use: No  . Drug Use: No  . Sexual Activity: Yes   Other Topics Concern  . Not on file   Social History Narrative    Family History  Problem Relation Age of Onset  . Coronary artery disease Father 37  . Heart attack Father 31  . Colon cancer Neg Hx   . GI problems Mother     Perforated colon   . Coronary artery disease Paternal Aunt   . Coronary artery disease Paternal Uncle     Review of Systems:  As stated in the HPI and otherwise negative.   BP 110/78 mmHg  Pulse 70  Ht 5' 1.5" (1.562 m)  Wt 125 lb 12.8 oz (57.063 kg)  BMI 23.39 kg/m2  SpO2 97%  Physical Examination: General: Well developed, well nourished, NAD HEENT: OP clear, mucus membranes moist SKIN: warm, dry. No rashes. Neuro: No focal deficits Musculoskeletal: Muscle strength 5/5 all ext Psychiatric: Mood and affect normal Neck: No JVD, no carotid bruits, no thyromegaly, no lymphadenopathy. Lungs:Clear bilaterally, no wheezes, rhonci, crackles Cardiovascular: Regular rate and  rhythm. No murmurs, gallops or rubs. Abdomen:Soft. Bowel sounds present. Non-tender.  Extremities: No lower extremity edema. Pulses are 2 + in the bilateral DP/PT.  Cardiac Cath April 2016: Left main: The distal vessel has 30% stenosis. This was closely examined in multiple views and did not appear to significant.  Left Anterior Descending Artery: Moderate sized vessel that courses to the apex. The ostium has a 30-40% stenosis. The proximal vessel has diffuse plaque. There are patent overlapping stents in the mid segment with 30% restenosis in the distal stented segment (unchanged from last cath). There is calcification in the proximal and mid vessel. The diagonal branch is very small.  Circumflex Artery: Moderate sized vessel with moderate sized obtuse marginal branch. The first obtuse marginal branch has 30% proximal stenosis and a patent stent in the mid segment with no restenosis. The AV groove Circumflex is small in caliber beyond the takeoff of the marginal branch. Moderate caliber intermediate branch with mild diffuse plaque.  Right Coronary Artery: Moderate sized dominant vessel with ostial 20% stenosis, diffuse 30% stenosis in the proximal and mid vessel. No flow limiting lesions noted.  Left Ventricular Angiogram: LVEF=65-70%.   Nuclear stress test 05/03/15:  Defect 1: There is a medium defect of moderate severity present in the basal inferoseptal and mid inferoseptal location. While soft tissue attenuation artifact is likely, scar cannot entirely be excluded. There is no ischemia.  Nuclear stress EF: 50%.  This is a low risk study.  There was no ST segment deviation noted during stress.  EKG:  EKG is ordered today. The ekg ordered today demonstrates NSR, rate 70 bpm.   Recent Labs: 04/24/2015: ALT 15; B Natriuretic Peptide 6.0; TSH 0.467 05/06/2015: BUN 6*; Creat 0.88; Hemoglobin 12.2; Platelets 259; Potassium 3.9; Sodium 131*   Lipid Panel No results found for: CHOL, TRIG, HDL,  CHOLHDL, VLDL, LDLCALC, LDLDIRECT   Wt Readings from Last 3 Encounters:  06/14/15 125 lb 12.8 oz (57.063 kg)  05/06/15 126 lb 3.2 oz (57.244 kg)  04/24/15 125 lb 3.5 oz (56.8 kg)     Other studies Reviewed: Additional studies/  records that were reviewed today include: . Review of the above records demonstrates:    Assessment and Plan:   1. CAD with unstable angina: Recent symptoms of dyspnea and chest pain are worrisome for unstable angina. The stress test is low risk but her symptoms are the same as she had before her LAD stents were placed. This could also be a manifestation of her lung disease. Will arrange right and left heart cath with possible PCI at [facility] 06/21/15. Risks and benefits of cath reviewed with pt. Pre-cath labs today. Continue current meds including ASA/Plavix. No beta blocker secondary to bradycardia. She is on Imdur.   2. HTN: BP well controlled. No changes.   3. HLD: Continue statin. Lipids followed in primary care.   Current medicines are reviewed at length with the patient today.  The patient does not have concerns regarding medicines.  The following changes have been made:  no change  Labs/ tests ordered today include:   Orders Placed This Encounter  Procedures  . CBC  . Basic Metabolic Panel (BMET)  . INR/PT  . EKG 12-Lead     Disposition:   FU with me after the cath    Signed, Lauree Chandler, MD 06/14/2015 3:53 PM    [facility] [address], [place], [place]  [address] Phone: [phone]; Fax: [phone]

## 2015-06-24 ENCOUNTER — Encounter (HOSPITAL_COMMUNITY): Payer: Self-pay | Admitting: Cardiovascular Disease

## 2015-06-24 MED FILL — Heparin Sodium (Porcine) 2 Unit/ML in Sodium Chloride 0.9%: INTRAMUSCULAR | Qty: 500 | Status: AC

## 2015-07-16 ENCOUNTER — Encounter: Payer: Self-pay | Admitting: Cardiovascular Disease

## 2015-07-16 ENCOUNTER — Ambulatory Visit (INDEPENDENT_AMBULATORY_CARE_PROVIDER_SITE_OTHER): Payer: Medicare Other | Admitting: Cardiovascular Disease

## 2015-07-16 VITALS — BP 124/74 | HR 64 | Ht 61.5 in | Wt 125.1 lb

## 2015-07-16 DIAGNOSIS — I2510 Atherosclerotic heart disease of native coronary artery without angina pectoris: Secondary | ICD-10-CM | POA: Diagnosis not present

## 2015-07-16 DIAGNOSIS — E785 Hyperlipidemia, unspecified: Secondary | ICD-10-CM | POA: Diagnosis not present

## 2015-07-16 DIAGNOSIS — R0600 Dyspnea, unspecified: Secondary | ICD-10-CM

## 2015-07-16 DIAGNOSIS — I10 Essential (primary) hypertension: Secondary | ICD-10-CM | POA: Diagnosis not present

## 2015-07-16 NOTE — Progress Notes (Signed)
Chief Complaint  Patient presents with  . Coronary Artery Disease    Denies any cp,sob,lee,or claudication  . Hypertension     History of Present Illness: 75 yo female with history of CAD, HTN, HLD, anxiety, Barrett's esophagus who is here today for cardiac follow up. I saw her as a new patient 10/14/11 for evaluation of chest pain. She had been seen by Dr. Chase Caller prior to her first visit here and her CT chest showed calcifications in the left main and all three major coronary arteries. I arranged a cardiac cath on 10/16/11. She was found to have a moderate 70% stenosis in the first obtuse marginal branch and a 90% stenosis in the mid LAD. FFR of the obtuse marginal stenosis was 0.98 and following infusion of adenosine was 0.89 suggesting this stenosis was not flow limiting. I could not navigate the flow wire into the LAD. This stenosis appeared critical. I then placed overlapping stents in the mid LAD. (2.5 x 24 mm Promus Element with more proximal overlapping 2.75 x 8 mm Promus Element DES). She did well following the procedure. She had recurrence of chest pain and was seen by Truitt Merle, NP on 02/25/12 and had c/o worsened dypsnea. I arranged a repeat cath on 03/07/12 and a 2.75 x 12 mm Promus Premier DES was placed in the OM branch. I saw her on 11/30/12 and she had c/o fatigue and SOB. Stress myoview without ischemia on images but she did have EKG changes suggestive of ischemia during exercise. Cardiac cath 12/14/12 with patent stents, moderate disease. IVUS of left main and proximal LAD confirmed mild plaque disease. Repeat cath April 2016 with stable CAD. She was admitted to [facility] 04/25/15 with dyspnea, chest pain with moderate exertion, fatigue. Troponin negative x 3. No ischemic EKG changes. Nuclear stress test 05/03/15 was low risk with LVEF=50%, possible inferior wall attenuation defect, no ischemia. I saw her in April 2017 and she continue to have dyspnea and chest pressure. I  arranged a cardiac cath but she cancelled. I saw her back in the office 06/14/15 and she c/o continued dyspnea and chest pressure. Cardiac cath 06/21/15 with moderate non-obstructive CAD, normal filling pressures.   She is here today for follow up.  She has started Advair and this has helped with her dyspnea. She has chest pressure when she bends over. This happened yesterday when in the garden.   Primary Care Physician: Etter Sjogren NP [facility] in [place])   Past Medical History  Diagnosis Date  . Hyperlipidemia   . Essential hypertension   . Anxiety   . Diverticulosis   . Barrett's esophagus without dysplasia   . Rectocele   . CAD (coronary artery disease)     a. LHC 10/16/11: dLM 20%, mLAD 90%, pOM1 30%, mOM1 70% (FFR not hemodynamically significant), prox and mid RCA 30%, EF 65%;  b. PCI 10/16/11: Promus DES x 2 to mLAD  . COPD (chronic obstructive pulmonary disease) ([place])   . Depression   . GERD (gastroesophageal reflux disease)     Past Surgical History  Procedure Laterality Date  . Partial hysterectomy  1980s  . Colonoscopy  01/2008    Multiple diverticula in desc and sigm colon  . Esophagogastroduodenoscopy  01/2008    Barrett's esophagus, gastritis, no h.pylori, due follow-up 01/2011  . Flexible sigmoidoscopy  08/11/2010 RECTAL PAIN/PRESSURE    SML IH, TICS  . Esophagogastroduodenoscopy  04/06/2011    Procedure: ESOPHAGOGASTRODUODENOSCOPY (EGD);  Surgeon: Danie Binder,  MD;  [facility];  Service: Endoscopy;  Laterality: N/A;  8:30  . Abdominal hysterectomy    . Cardiac catheterization    . Fractional flow reserve wire N/A 10/16/2011    Procedure: FRACTIONAL FLOW RESERVE WIRE;  Surgeon: [name], MD;  Location: [facility] CATH LAB;  Service: Cardiovascular;  Laterality: N/A;  . Left heart catheterization with coronary angiogram N/A 03/07/2012    Procedure: LEFT HEART CATHETERIZATION WITH CORONARY ANGIOGRAM;  Surgeon: [name], MD;  Location:  [facility] CATH LAB;  Service: Cardiovascular;  Laterality: N/A;  . Left heart catheterization with coronary angiogram N/A 12/14/2012    Procedure: LEFT HEART CATHETERIZATION WITH CORONARY ANGIOGRAM;  Surgeon: [name], MD;  Location: [facility] CATH LAB;  Service: Cardiovascular;  Laterality: N/A;  . Left heart catheterization with coronary angiogram N/A 05/07/2014    Procedure: LEFT HEART CATHETERIZATION WITH CORONARY ANGIOGRAM;  Surgeon: [name], MD;  Location: [facility] CATH LAB;  Service: Cardiovascular;  Laterality: N/A;  . Cardiac catheterization N/A 06/21/2015    Procedure: Right/Left Heart Cath and Coronary Angiography;  Surgeon: [name], MD;  Location: [place] CV LAB;  Service: Cardiovascular;  Laterality: N/A;    Current Outpatient Prescriptions  Medication Sig Dispense Refill  . ADVAIR DISKUS 250-50 MCG/DOSE AEPB Inhale 1 puff into the lungs.    . ALPRAZolam (XANAX) 0.25 MG tablet Take 0.25 mg by mouth daily as needed. For anxiety    . aspirin 81 MG chewable tablet Chew 81 mg by mouth daily.    Marland Kitchen atorvastatin (LIPITOR) 10 MG tablet Take 10 mg by mouth at bedtime.     . Biotin 5000 MCG CAPS Take 1 capsule by mouth daily.     . bisoprolol (ZEBETA) 5 MG tablet Take 1 tablet (5 mg total) by mouth daily. 30 tablet 1  . Calcium Carbonate-Vitamin D (CALCIUM + D) 600-200 MG-UNIT TABS Take 1 tablet by mouth 2 (two) times daily.     . clopidogrel (PLAVIX) 75 MG tablet Take 1 tablet (75 mg total) by mouth daily with  breakfast. 90 tablet 3  . diphenhydramine-acetaminophen (TYLENOL PM) 25-500 MG TABS Take 2 tablets by mouth at bedtime as needed (Sleep).    . docusate sodium (COLACE) 100 MG capsule Take 100-200 mg by mouth at bedtime.    . furosemide (LASIX) 20 MG tablet Take 1 tablet (20 mg total) by mouth daily. 30 tablet 1  . isosorbide mononitrate (IMDUR) 30 MG 24 hr tablet Take 1 tablet (30 mg total) by mouth daily. 90 tablet 3  . lisinopril (PRINIVIL,ZESTRIL) 20 MG  tablet Take 1 tablet (20 mg total) by mouth daily. 30 tablet 1  . nitroGLYCERIN (NITROSTAT) 0.4 MG SL tablet Place 1 tablet (0.4 mg total) under the tongue every 5 (five) minutes x 3 doses as needed for chest pain. 75 tablet 3  . pantoprazole (PROTONIX) 20 MG tablet Take 20 mg by mouth daily.    . Polyvinyl Alcohol-Povidone (REFRESH OP) Apply 2 drops to eye daily as needed (Scratchy eyes).    . potassium chloride SA (K-DUR,KLOR-CON) 20 MEQ tablet Take 40 mEq by mouth every morning.    . venlafaxine XR (EFFEXOR-XR) 37.5 MG 24 hr capsule Take 37.5 mg by mouth at bedtime.    . vitamin B-12 (CYANOCOBALAMIN) 1000 MCG tablet Take 2,000 mcg by mouth daily.    . [DISCONTINUED] niacin (NIASPAN) 500 MG CR tablet Take 500 mg by mouth at bedtime.       No  current facility-administered medications for this visit.    Allergies  Allergen Reactions  . Adhesive [Tape] Other (See Comments)    Turns skin red  . Sulfa Antibiotics Other (See Comments)    Childhood allergy    Social History   Social History  . Marital Status: Married    Spouse Name: N/A  . Number of Children: 1  . Years of Education: N/A   Occupational History  . 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     Social History Main Topics  . Smoking status: Former Smoker -- 1.00 packs/day for 53 years    Types: Cigarettes    Quit date: 03/20/2007  . Smokeless tobacco: Never Used  . Alcohol Use: No  . Drug Use: No  . Sexual Activity: Yes   Other Topics Concern  . Not on file   Social History Narrative    Family History  Problem Relation Age of Onset  . Coronary artery disease Father 39  . Heart attack Father 44  . Colon cancer Neg Hx   . GI problems Mother     Perforated colon   . Coronary artery disease Paternal Aunt   . Coronary artery disease Paternal Uncle     Review of Systems:  As stated in the HPI and otherwise negative.   BP 124/74 mmHg  Pulse 64  Ht 5' 1.5" (1.562 m)  Wt 125 lb 1.9 oz (56.754 kg)   BMI 23.26 kg/m2  Physical Examination: General: Well developed, well nourished, NAD HEENT: OP clear, mucus membranes moist SKIN: warm, dry. No rashes. Neuro: No focal deficits Musculoskeletal: Muscle strength 5/5 all ext Psychiatric: Mood and affect normal Neck: No JVD, no carotid bruits, no thyromegaly, no lymphadenopathy. Lungs:Clear bilaterally, no wheezes, rhonci, crackles Cardiovascular: Regular rate and rhythm. No murmurs, gallops or rubs. Abdomen:Soft. Bowel sounds present. Non-tender.  Extremities: No lower extremity edema. Pulses are 2 + in the bilateral DP/PT.  Cardiac Cath 06/21/15:  Ost RCA lesion, 30% stenosed.  Ost Cx to Prox Cx lesion, 20% stenosed.  Prox Cx to Mid Cx lesion, 20% stenosed.  Prox LAD lesion, 30% stenosed.  Mid LAD to Dist LAD lesion, 10% stenosed.  LM lesion, 30% stenosed.  Ost LAD lesion, 30% stenosed.  The left ventricular systolic function is normal.  Mid RCA-1 lesion, 20% stenosed.  Mid RCA-2 lesion, 30% stenosed.  Ost Ramus lesion, 50% stenosed.  1. Double vessel CAD with patent stents mid LAD and obtuse marginal 2. Mild non-obstructive disease in the distal left main, proximal LAD, proximal Circumflex and RCA.  3. Normal LV systolic function 4. Normal right sided pressures  Nuclear stress test 05/03/15:  Defect 1: There is a medium defect of moderate severity present in the basal inferoseptal and mid inferoseptal location. While soft tissue attenuation artifact is likely, scar cannot entirely be excluded. There is no ischemia.  Nuclear stress EF: 50%.  This is a low risk study.  There was no ST segment deviation noted during stress.  EKG:  EKG is not ordered today. The ekg ordered today demonstrates   Recent Labs: 04/24/2015: ALT 15; B Natriuretic Peptide 6.0; TSH 0.467 06/14/2015: BUN 7; Creat 0.95*; Hemoglobin 13.2; Platelets 326; Potassium 4.3; Sodium 128*   Lipid Panel No results found for: CHOL, TRIG, HDL, CHOLHDL,  VLDL, LDLCALC, LDLDIRECT   Wt Readings from Last 3 Encounters:  07/16/15 125 lb 1.9 oz (56.754 kg)  06/21/15 125 lb (56.7 kg)  06/14/15 125 lb 12.8 oz (57.063 kg)  Other studies Reviewed: Additional studies/ records that were reviewed today include: . Review of the above records demonstrates:    Assessment and Plan:   1. CAD: Stable CAD by recent cath 06/21/15. Continue current meds including ASA/Plavix and Imdur. No beta blocker secondary to bradycardia.    2. HTN: BP well controlled. No changes.   3. HLD: Continue statin. Lipids followed in primary care.   4. Dyspnea: I believe this is primarily related to her lungs. She is now on Advair per primary care with some improvement. She will get back in to see Pulmonary to discuss further.    Current medicines are reviewed at length with the patient today.  The patient does not have concerns regarding medicines.  The following changes have been made:  no change  Labs/ tests ordered today include:   No orders of the defined types were placed in this encounter.     Disposition:   FU with me in 6 months.     Signed, Lauree Chandler, MD 07/16/2015 1:22 PM    [facility] [place], [place], [place]  [address] Phone: [phone]; Fax: [phone]

## 2015-07-16 NOTE — Patient Instructions (Signed)

## 2015-08-21 ENCOUNTER — Encounter: Payer: Self-pay | Admitting: Cardiovascular Disease

## 2015-08-22 ENCOUNTER — Encounter: Payer: Self-pay | Admitting: Cardiovascular Disease

## 2015-11-15 ENCOUNTER — Institutional Professional Consult (permissible substitution): Payer: Medicare Other | Admitting: Internal Medicine

## 2015-12-24 ENCOUNTER — Institutional Professional Consult (permissible substitution): Payer: Medicare Other | Admitting: Internal Medicine

## 2016-01-06 ENCOUNTER — Other Ambulatory Visit: Payer: Self-pay | Admitting: Cardiovascular Disease

## 2016-01-07 NOTE — Telephone Encounter (Signed)
Medication Detail    Disp Refills Start End   isosorbide mononitrate (IMDUR) 30 MG 24 hr tablet 90 tablet 3 05/06/2015    Sig - Route: Take 1 tablet (30 mg total) by mouth daily. - Oral   E-Prescribing Status: Receipt confirmed by pharmacy (05/06/2015 8:51 AM EDT)   Pharmacy   [facility] [place], [place]

## 2016-02-19 ENCOUNTER — Ambulatory Visit (INDEPENDENT_AMBULATORY_CARE_PROVIDER_SITE_OTHER): Payer: Medicare Other | Admitting: Cardiovascular Disease

## 2016-02-19 VITALS — BP 122/70 | HR 76 | Ht 61.5 in | Wt 124.6 lb

## 2016-02-19 DIAGNOSIS — I10 Essential (primary) hypertension: Secondary | ICD-10-CM | POA: Diagnosis not present

## 2016-02-19 DIAGNOSIS — E7800 Pure hypercholesterolemia, unspecified: Secondary | ICD-10-CM | POA: Diagnosis not present

## 2016-02-19 DIAGNOSIS — R0600 Dyspnea, unspecified: Secondary | ICD-10-CM | POA: Diagnosis not present

## 2016-02-19 DIAGNOSIS — I2510 Atherosclerotic heart disease of native coronary artery without angina pectoris: Secondary | ICD-10-CM | POA: Diagnosis not present

## 2016-02-19 NOTE — Progress Notes (Signed)
Chief Complaint  Patient presents with  . Atherosclerosis of native coronary artery    6 month follow up     History of Present Illness: 76 yo female with history of CAD, HTN, HLD, anxiety, Barrett's esophagus who is here today for cardiac follow up. I saw her as a new patient 10/14/11 for evaluation of chest pain. She had been seen by Dr. Chase Caller prior to her first visit here and her CT chest showed calcifications in the left main and all three major coronary arteries. I arranged a cardiac cath on 10/16/11. She was found to have a moderate 70% stenosis in the first obtuse marginal branch and a 90% stenosis in the mid LAD. FFR of the obtuse marginal stenosis was 0.98 and following infusion of adenosine was 0.89 suggesting this stenosis was not flow limiting. I could not navigate the flow wire into the LAD. This stenosis appeared critical. I then placed overlapping stents in the mid LAD. (2.5 x 24 mm Promus Element with more proximal overlapping 2.75 x 8 mm Promus Element DES). She did well following the procedure. She had recurrence of chest pain and was seen by Truitt Merle, NP on 02/25/12 and had c/o worsened dypsnea. I arranged a repeat cath on 03/07/12 and a 2.75 x 12 mm Promus Premier DES was placed in the OM branch. I saw her on 11/30/12 and she had c/o fatigue and SOB. Stress myoview without ischemia on images but she did have EKG changes suggestive of ischemia during exercise. Cardiac cath 12/14/12 with patent stents, moderate disease. IVUS of left main and proximal LAD confirmed mild plaque disease. Repeat cath April 2016 with stable CAD. She was admitted to [facility]/25/15 with dyspnea, chest pain with moderate exertion, fatigue. Troponin negative x 3. No ischemic EKG changes. Nuclear stress test 05/03/15 was low risk with LVEF=50%, possible inferior wall attenuation defect, no ischemia. I saw her in April 2017 and she continue to have dyspnea and chest pressure. I arranged a cardiac  cath but she cancelled. I saw her back in the office 06/14/15 and she c/o continued dyspnea and chest pressure. Cardiac cath 06/21/15 with moderate non-obstructive CAD, normal filling pressures.   She is here today for follow up. Her breathing is stable. No change in dyspnea. She has no chest pain.    Primary Care Physician: PROVIDER NOT [place], Lynelle Smoke NP ([facility] in [place])  Past Medical History:  Diagnosis Date  . Anxiety   . Barrett's esophagus without dysplasia   . CAD (coronary artery disease)    a. LHC 10/16/11: dLM 20%, mLAD 90%, pOM1 30%, mOM1 70% (FFR not hemodynamically significant), prox and mid RCA 30%, EF 65%;  b. PCI 10/16/11: Promus DES x 2 to mLAD  . COPD (chronic obstructive pulmonary disease) ([place])   . Depression   . Diverticulosis   . Essential hypertension   . GERD (gastroesophageal reflux disease)   . Hyperlipidemia   . Rectocele     Past Surgical History:  Procedure Laterality Date  . ABDOMINAL HYSTERECTOMY    . CARDIAC CATHETERIZATION    . CARDIAC CATHETERIZATION N/A 06/21/2015   Procedure: Right/Left Heart Cath and Coronary Angiography;  Surgeon: [name], MD;  Location: [place] CV LAB;  Service: Cardiovascular;  Laterality: N/A;  . COLONOSCOPY  01/2008   Multiple diverticula in desc and sigm colon  . ESOPHAGOGASTRODUODENOSCOPY  01/2008   Barrett's esophagus, gastritis, no h.pylori, due follow-up 01/2011  . ESOPHAGOGASTRODUODENOSCOPY  04/06/2011  Procedure: ESOPHAGOGASTRODUODENOSCOPY (EGD);  Surgeon: Danie Binder, MD;  [facility];  Service: Endoscopy;  Laterality: N/A;  8:30  . FLEXIBLE SIGMOIDOSCOPY  08/11/2010 RECTAL PAIN/PRESSURE   SML IH, TICS  . FRACTIONAL FLOW RESERVE WIRE N/A 10/16/2011   Procedure: FRACTIONAL FLOW RESERVE WIRE;  Surgeon: [name], MD;  Location: [facility] CATH LAB;  Service: Cardiovascular;  Laterality: N/A;  . LEFT HEART CATHETERIZATION WITH CORONARY ANGIOGRAM N/A 03/07/2012    Procedure: LEFT HEART CATHETERIZATION WITH CORONARY ANGIOGRAM;  Surgeon: [name], MD;  Location: [facility] CATH LAB;  Service: Cardiovascular;  Laterality: N/A;  . LEFT HEART CATHETERIZATION WITH CORONARY ANGIOGRAM N/A 12/14/2012   Procedure: LEFT HEART CATHETERIZATION WITH CORONARY ANGIOGRAM;  Surgeon: [name], MD;  Location: [facility] CATH LAB;  Service: Cardiovascular;  Laterality: N/A;  . LEFT HEART CATHETERIZATION WITH CORONARY ANGIOGRAM N/A 05/07/2014   Procedure: LEFT HEART CATHETERIZATION WITH CORONARY ANGIOGRAM;  Surgeon: [name], MD;  Location: [facility] CATH LAB;  Service: Cardiovascular;  Laterality: N/A;  . PARTIAL HYSTERECTOMY  1980s    Current Outpatient Prescriptions  Medication Sig Dispense Refill  . ADVAIR DISKUS 250-50 MCG/DOSE AEPB Inhale 1 puff into the lungs.    . ALPRAZolam (XANAX) 0.25 MG tablet Take 0.25 mg by mouth daily as needed. For anxiety    . aspirin 81 MG chewable tablet Chew 81 mg by mouth daily.    Marland Kitchen atorvastatin (LIPITOR) 10 MG tablet Take 10 mg by mouth at bedtime.     . Biotin 5000 MCG CAPS Take 1 capsule by mouth daily.     . bisoprolol (ZEBETA) 5 MG tablet Take 1 tablet (5 mg total) by mouth daily. 30 tablet 1  . Calcium Carbonate-Vitamin D (CALCIUM + D) 600-200 MG-UNIT TABS Take 1 tablet by mouth 2 (two) times daily.     . clopidogrel (PLAVIX) 75 MG tablet Take 1 tablet (75 mg total) by mouth daily with  breakfast. 90 tablet 3  . diphenhydramine-acetaminophen (TYLENOL PM) 25-500 MG TABS Take 2 tablets by mouth at bedtime as needed (Sleep).    . docusate sodium (COLACE) 100 MG capsule Take 100-200 mg by mouth at bedtime.    . furosemide (LASIX) 20 MG tablet Take 1 tablet (20 mg total) by mouth daily. 30 tablet 1  . isosorbide mononitrate (IMDUR) 30 MG 24 hr tablet Take 1 tablet (30 mg total) by mouth daily. 90 tablet 3  . lisinopril (PRINIVIL,ZESTRIL) 20 MG tablet Take 1 tablet (20 mg total) by mouth daily. 30 tablet 1  .  nitroGLYCERIN (NITROSTAT) 0.4 MG SL tablet Place 1 tablet (0.4 mg total) under the tongue every 5 (five) minutes x 3 doses as needed for chest pain. 75 tablet 3  . pantoprazole (PROTONIX) 20 MG tablet Take 20 mg by mouth daily.    . Polyvinyl Alcohol-Povidone (REFRESH OP) Apply 2 drops to eye daily as needed (Scratchy eyes).    . potassium chloride SA (K-DUR,KLOR-CON) 20 MEQ tablet Take 40 mEq by mouth every morning.    . venlafaxine XR (EFFEXOR-XR) 37.5 MG 24 hr capsule Take 37.5 mg by mouth at bedtime.    . vitamin B-12 (CYANOCOBALAMIN) 1000 MCG tablet Take 2,000 mcg by mouth daily.     No current facility-administered medications for this visit.     Allergies  Allergen Reactions  . Adhesive [Tape] Other (See Comments)    Turns skin red  . Sulfa Antibiotics Other (See Comments)    Childhood allergy    Social  History   Social History  . Marital status: Married    Spouse name: N/A  . Number of children: 1  . Years of education: N/A   Occupational History  . 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 Smoking status: Former Smoker    Packs/day: 1.00    Years: 53.00    Types: Cigarettes    Quit date: 03/20/2007  . Smokeless tobacco: Never Used  . Alcohol use No  . Drug use: No  . Sexual activity: Yes   Other Topics Concern  . Not on file   Social History Narrative  . No narrative on file    Family History  Problem Relation Age of Onset  . Coronary artery disease Father 8  . Heart attack Father 38  . Colon cancer Neg Hx   . GI problems Mother     Perforated colon   . Coronary artery disease Paternal Aunt   . Coronary artery disease Paternal Uncle     Review of Systems:  As stated in the HPI and otherwise negative.   BP 122/70   Pulse 76   Ht 5' 1.5" (1.562 m)   Wt 124 lb 9.6 oz (56.5 kg)   BMI 23.16 kg/m   Physical Examination: General: Well developed, well nourished, NAD  HEENT: OP clear, mucus membranes  moist  SKIN: warm, dry. No rashes. Neuro: No focal deficits  Musculoskeletal: Muscle strength 5/5 all ext  Psychiatric: Mood and affect normal  Neck: No JVD, no carotid bruits, no thyromegaly, no lymphadenopathy.  Lungs:Clear bilaterally, no wheezes, rhonci, crackles Cardiovascular: Regular rate and rhythm. No murmurs, gallops or rubs. Abdomen:Soft. Bowel sounds present. Non-tender.  Extremities: No lower extremity edema. Pulses are 2 + in the bilateral DP/PT.  Cardiac Cath 06/21/15:  Ost RCA lesion, 30% stenosed.  Ost Cx to Prox Cx lesion, 20% stenosed.  Prox Cx to Mid Cx lesion, 20% stenosed.  Prox LAD lesion, 30% stenosed.  Mid LAD to Dist LAD lesion, 10% stenosed.  LM lesion, 30% stenosed.  Ost LAD lesion, 30% stenosed.  The left ventricular systolic function is normal.  Mid RCA-1 lesion, 20% stenosed.  Mid RCA-2 lesion, 30% stenosed.  Ost Ramus lesion, 50% stenosed.  1. Double vessel CAD with patent stents mid LAD and obtuse marginal 2. Mild non-obstructive disease in the distal left main, proximal LAD, proximal Circumflex and RCA.  3. Normal LV systolic function 4. Normal right sided pressures  Nuclear stress test 05/03/15:  Defect 1: There is a medium defect of moderate severity present in the basal inferoseptal and mid inferoseptal location. While soft tissue attenuation artifact is likely, scar cannot entirely be excluded. There is no ischemia.  Nuclear stress EF: 50%.  This is a low risk study.  There was no ST segment deviation noted during stress.  EKG:  EKG is ordered today. The ekg ordered today demonstrates NSR, rate 76 bpm.   Recent Labs: 04/24/2015: ALT 15; B Natriuretic Peptide 6.0; TSH 0.467 06/14/2015: BUN 7; Creat 0.95; Hemoglobin 13.2; Platelets 326; Potassium 4.3; Sodium 128   Lipid Panel No results found for: CHOL, TRIG, HDL, CHOLHDL, VLDL, LDLCALC, LDLDIRECT   Wt Readings from Last 3 Encounters:  02/19/16 124 lb 9.6 oz (56.5 kg)    07/16/15 125 lb 1.9 oz (56.8 kg)  06/21/15 125 lb (56.7 kg)     Other studies Reviewed: Additional studies/ records that were reviewed today include: . Review of  the above records demonstrates:    Assessment and Plan:   1. CAD without angina: No chest pain suggestive of angina. Stable CAD by recent cath 06/21/15. Continue current meds including ASA/Plavix and Imdur. No beta blocker secondary to bradycardia.    2. HTN: BP well controlled. No changes.   3. HLD: Continue statin. Lipids followed in primary care.   4. Dyspnea: I believe this is primarily related to her lungs. She is now on Advair per primary care with some improvement.   Current medicines are reviewed at length with the patient today.  The patient does not have concerns regarding medicines.  The following changes have been made:  no change  Labs/ tests ordered today include:   Orders Placed This Encounter  Procedures  . EKG 12-Lead     Disposition:   FU with me in 6 months.     Signed, Lauree Chandler, MD 02/19/2016 5:01 PM    [facility] [address], [place], [place]  [address] Phone: [phone]; Fax: [phone]

## 2016-02-19 NOTE — Patient Instructions (Signed)

## 2016-03-31 ENCOUNTER — Other Ambulatory Visit: Payer: Self-pay | Admitting: Cardiovascular Disease

## 2016-03-31 ENCOUNTER — Encounter: Payer: Self-pay | Admitting: Cardiovascular Disease

## 2016-03-31 ENCOUNTER — Other Ambulatory Visit: Payer: Self-pay | Admitting: *Deleted

## 2016-03-31 DIAGNOSIS — I25110 Atherosclerotic heart disease of native coronary artery with unstable angina pectoris: Secondary | ICD-10-CM

## 2016-03-31 MED ORDER — ISOSORBIDE MONONITRATE ER 30 MG PO TB24: 30.0000 mg | ORAL_TABLET | Freq: Every day | ORAL | 3 refills | Status: DC

## 2016-08-04 ENCOUNTER — Encounter: Payer: Self-pay | Admitting: *Deleted

## 2016-08-19 ENCOUNTER — Encounter: Payer: Self-pay | Admitting: Cardiovascular Disease

## 2016-08-19 ENCOUNTER — Ambulatory Visit (INDEPENDENT_AMBULATORY_CARE_PROVIDER_SITE_OTHER): Payer: Medicare Other | Admitting: Cardiovascular Disease

## 2016-08-19 VITALS — BP 126/78 | HR 65 | Ht 61.5 in | Wt 126.6 lb

## 2016-08-19 DIAGNOSIS — I209 Angina pectoris, unspecified: Secondary | ICD-10-CM

## 2016-08-19 DIAGNOSIS — I25110 Atherosclerotic heart disease of native coronary artery with unstable angina pectoris: Secondary | ICD-10-CM | POA: Diagnosis not present

## 2016-08-19 DIAGNOSIS — E7800 Pure hypercholesterolemia, unspecified: Secondary | ICD-10-CM

## 2016-08-19 DIAGNOSIS — I25118 Atherosclerotic heart disease of native coronary artery with other forms of angina pectoris: Secondary | ICD-10-CM

## 2016-08-19 DIAGNOSIS — R0600 Dyspnea, unspecified: Secondary | ICD-10-CM

## 2016-08-19 DIAGNOSIS — I10 Essential (primary) hypertension: Secondary | ICD-10-CM

## 2016-08-19 NOTE — Patient Instructions (Signed)

## 2016-08-19 NOTE — Progress Notes (Signed)
Chief Complaint  Patient presents with  . Follow-up   History of Present Illness: 76 yo female with history of CAD, HTN, HLD, anxiety, Barrett's esophagus who is here today for cardiac follow up. Cardiac cath in 2013 showed a moderate 70% stenosis in the first obtuse marginal branch and a 90% stenosis in the mid LAD. FFR of the obtuse marginal branch was negative. Overlapping drug eluting stents were placed in the mid LAD.  Repeat cath February 2014 in setting of dyspnea and a drug eluting stent was placed in the OM branch. I saw her on 11/30/12 and she had c/o fatigue and SOB. Stress myoview without ischemia on images but she did have EKG changes suggestive of ischemia during exercise. Cardiac cath 12/14/12 with patent stents, moderate disease. IVUS of left main and proximal LAD confirmed mild plaque disease. Repeat cath April 2016 with stable CAD. She was admitted to [facility] 04/25/15 with dyspnea, chest pain with moderate exertion, fatigue. Troponin negative x 3. No ischemic EKG changes. Nuclear stress test 05/03/15 was low risk with LVEF=50%, possible inferior wall attenuation defect, no ischemia. I saw her in April 2017 and she continued to have dyspnea and chest pressure. I arranged a cardiac cath but she cancelled. I saw her back in the office 06/14/15 and she c/o continued dyspnea and chest pressure. Cardiac cath 06/21/15 with moderate non-obstructive CAD, normal filling pressures.   She is here today for follow up. The patient denies any chest pain, dyspnea, palpitations, lower extremity edema, orthopnea, PND, dizziness, near syncope or syncope.     Primary Care Physician: System, Provider Not In [place], Lynelle Smoke NP ([place] in [place])  Past Medical History:  Diagnosis Date  . Anxiety   . Barrett's esophagus without dysplasia   . CAD (coronary artery disease)    a. LHC 10/16/11: dLM 20%, mLAD 90%, pOM1 30%, mOM1 70% (FFR not hemodynamically significant), prox and mid RCA 30%, EF  65%;  b. PCI 10/16/11: Promus DES x 2 to mLAD  . COPD (chronic obstructive pulmonary disease) ([place])   . Depression   . Diverticulosis   . Essential hypertension   . GERD (gastroesophageal reflux disease)   . Hyperlipidemia   . Rectocele     Past Surgical History:  Procedure Laterality Date  . ABDOMINAL HYSTERECTOMY    . CARDIAC CATHETERIZATION    . CARDIAC CATHETERIZATION N/A 06/21/2015   Procedure: Right/Left Heart Cath and Coronary Angiography;  Surgeon: [name], MD;  Location: [place] [facility] LAB;  Service: Cardiovascular;  Laterality: N/A;  . COLONOSCOPY  01/2008   Multiple diverticula in desc and sigm colon  . ESOPHAGOGASTRODUODENOSCOPY  01/2008   Barrett's esophagus, gastritis, no h.pylori, due follow-up 01/2011  . ESOPHAGOGASTRODUODENOSCOPY  04/06/2011   Procedure: ESOPHAGOGASTRODUODENOSCOPY (EGD);  Surgeon: Danie Binder, MD;  [facility];  Service: Endoscopy;  Laterality: N/A;  8:30  . FLEXIBLE SIGMOIDOSCOPY  08/11/2010 RECTAL PAIN/PRESSURE   SML IH, TICS  . FRACTIONAL FLOW RESERVE WIRE N/A 10/16/2011   Procedure: FRACTIONAL FLOW RESERVE WIRE;  Surgeon: [name], MD;  Location: [facility] CATH LAB;  Service: Cardiovascular;  Laterality: N/A;  . LEFT HEART CATHETERIZATION WITH CORONARY ANGIOGRAM N/A 03/07/2012   Procedure: LEFT HEART CATHETERIZATION WITH CORONARY ANGIOGRAM;  Surgeon: [name], MD;  Location: [facility] CATH LAB;  Service: Cardiovascular;  Laterality: N/A;  . LEFT HEART CATHETERIZATION WITH CORONARY ANGIOGRAM N/A 12/14/2012   Procedure: LEFT HEART CATHETERIZATION WITH CORONARY ANGIOGRAM;  Surgeon: Annita Brod  Angelena Form, MD;  Location: [facility] CATH LAB;  Service: Cardiovascular;  Laterality: N/A;  . LEFT HEART CATHETERIZATION WITH CORONARY ANGIOGRAM N/A 05/07/2014   Procedure: LEFT HEART CATHETERIZATION WITH CORONARY ANGIOGRAM;  Surgeon: [name], MD;  Location: [facility] CATH LAB;  Service: Cardiovascular;  Laterality: N/A;  .  PARTIAL HYSTERECTOMY  1980s    Current Outpatient Prescriptions  Medication Sig Dispense Refill  . ALPRAZolam (XANAX) 0.25 MG tablet Take 0.25 mg by mouth daily as needed. For anxiety    . atorvastatin (LIPITOR) 10 MG tablet Take 10 mg by mouth at bedtime.     . Biotin 5000 MCG CAPS Take 1 capsule by mouth daily.     . bisoprolol (ZEBETA) 5 MG tablet Take 1 tablet (5 mg total) by mouth daily. 30 tablet 1  . Calcium Carbonate-Vitamin D (CALCIUM + D) 600-200 MG-UNIT TABS Take 1 tablet by mouth 2 (two) times daily.     . clopidogrel (PLAVIX) 75 MG tablet TAKE 1 TABLET BY MOUTH  DAILY WITH BREAKFAST 90 tablet 1  . diphenhydramine-acetaminophen (TYLENOL PM) 25-500 MG TABS Take 2 tablets by mouth at bedtime as needed (Sleep).    . docusate sodium (COLACE) 100 MG capsule Take 100-200 mg by mouth at bedtime.    . furosemide (LASIX) 20 MG tablet Take 1 tablet (20 mg total) by mouth daily. 30 tablet 1  . isosorbide mononitrate (IMDUR) 30 MG 24 hr tablet Take 1 tablet (30 mg total) by mouth daily. 90 tablet 3  . lisinopril (PRINIVIL,ZESTRIL) 20 MG tablet Take 1 tablet (20 mg total) by mouth daily. 30 tablet 1  . nitroGLYCERIN (NITROSTAT) 0.4 MG SL tablet Place 1 tablet (0.4 mg total) under the tongue every 5 (five) minutes x 3 doses as needed for chest pain. 75 tablet 3  . pantoprazole (PROTONIX) 20 MG tablet Take 20 mg by mouth daily.    . Polyvinyl Alcohol-Povidone (REFRESH OP) Apply 2 drops to eye daily as needed (Scratchy eyes).    . potassium chloride SA (K-DUR,KLOR-CON) 20 MEQ tablet Take 40 mEq by mouth every morning.    . venlafaxine XR (EFFEXOR-XR) 37.5 MG 24 hr capsule Take 37.5 mg by mouth at bedtime.    . vitamin B-12 (CYANOCOBALAMIN) 1000 MCG tablet Take 2,000 mcg by mouth daily.     No current facility-administered medications for this visit.     Allergies  Allergen Reactions  . Adhesive [Tape] Other (See Comments)    Turns skin red  . Sulfa Antibiotics Other (See Comments)     Childhood allergy    Social History   Social History  . Marital status: Married    Spouse name: N/A  . Number of children: 1  . Years of education: N/A   Occupational History  . 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 Smoking status: Former Smoker    Packs/day: 1.00    Years: 53.00    Types: Cigarettes    Quit date: 03/20/2007  . Smokeless tobacco: Never Used  . Alcohol use No  . Drug use: No  . Sexual activity: Yes   Other Topics Concern  . Not on file   Social History Narrative  . No narrative on file    Family History  Problem Relation Age of Onset  . Coronary artery disease Father 38  . Heart attack Father 30  . GI problems Mother        Perforated colon   .  Coronary artery disease Paternal Aunt   . Coronary artery disease Paternal Uncle   . Colon cancer Neg Hx     Review of Systems:  As stated in the HPI and otherwise negative.   BP 126/78   Pulse 65   Ht 5' 1.5" (1.562 m)   Wt 126 lb 9.6 oz (57.4 kg)   SpO2 98%   BMI 23.53 kg/m   Physical Examination: General: Well developed, well nourished, NAD  HEENT: OP clear, mucus membranes moist  SKIN: warm, dry. No rashes. Neuro: No focal deficits  Musculoskeletal: Muscle strength 5/5 all ext  Psychiatric: Mood and affect normal  Neck: No JVD, no carotid bruits, no thyromegaly, no lymphadenopathy.  Lungs:Clear bilaterally, no wheezes, rhonci, crackles Cardiovascular: Regular rate and rhythm. No murmurs, gallops or rubs. Abdomen:Soft. Bowel sounds present. Non-tender.  Extremities: No lower extremity edema. Pulses are 2 + in the bilateral DP/PT.   Cardiac Cath 06/21/15:  Ost RCA lesion, 30% stenosed.  Ost Cx to Prox Cx lesion, 20% stenosed.  Prox Cx to Mid Cx lesion, 20% stenosed.  Prox LAD lesion, 30% stenosed.  Mid LAD to Dist LAD lesion, 10% stenosed.  LM lesion, 30% stenosed.  Ost LAD lesion, 30% stenosed.  The left ventricular systolic  function is normal.  Mid RCA-1 lesion, 20% stenosed.  Mid RCA-2 lesion, 30% stenosed.  Ost Ramus lesion, 50% stenosed.  1. Double vessel CAD with patent stents mid LAD and obtuse marginal 2. Mild non-obstructive disease in the distal left main, proximal LAD, proximal Circumflex and RCA.  3. Normal LV systolic function 4. Normal right sided pressures  Nuclear stress test 05/03/15:  Defect 1: There is a medium defect of moderate severity present in the basal inferoseptal and mid inferoseptal location. While soft tissue attenuation artifact is likely, scar cannot entirely be excluded. There is no ischemia.  Nuclear stress EF: 50%.  This is a low risk study.  There was no ST segment deviation noted during stress.  EKG:  EKG is not ordered today. The ekg ordered today demonstrates    Recent Labs: No results found for requested labs within last 8760 hours.   Lipid Panel Followed in primary care  Wt Readings from Last 3 Encounters:  08/19/16 126 lb 9.6 oz (57.4 kg)  02/19/16 124 lb 9.6 oz (56.5 kg)  07/16/15 125 lb 1.9 oz (56.8 kg)     Other studies Reviewed: Additional studies/ records that were reviewed today include: . Review of the above records demonstrates:    Assessment and Plan:   1. CAD with angina: She is having no change in her baseline chest pain. Stable CAD by cath June 2017. Will continue Plavix, beta blocker and Imdur. She stopped ASA due to bruising. She has had no prior echocardiogram. We discussed this. She feels well and has no murmurs on exam. Will discuss echo at next visit.    2. HTN: BP controlled. No changes.   3. HLD: Lipids followed in primary care and well controlled per pt. Continue statin.   4. Dyspnea: Felt to be due to her lung disease. Weight is stable.   Current medicines are reviewed at length with the patient today.  The patient does not have concerns regarding medicines.  The following changes have been made:  no change  Labs/  tests ordered today include:   No orders of the defined types were placed in this encounter.    Disposition:   FU with me in 12 months.  Signed, Lauree Chandler, MD 08/19/2016 3:40 PM    [facility] [address], [place], [place]  [address] Phone: [phone]; Fax: [phone]

## 2016-10-09 ENCOUNTER — Telehealth: Payer: Self-pay | Admitting: Cardiovascular Disease

## 2016-10-09 ENCOUNTER — Encounter: Payer: Self-pay | Admitting: Cardiovascular Disease

## 2016-10-09 NOTE — Telephone Encounter (Signed)
Clearance form has been completed by Dr. Angelena Form and medical records will fax this morning.

## 2016-10-09 NOTE — Telephone Encounter (Signed)
Clearance Faxed to [facility] @ [phone]

## 2016-10-09 NOTE — Telephone Encounter (Signed)
New message       [facility] Pre-operative Risk Assessment    Request for surgical clearance:  1. What type of surgery is being performed? Epidural injection in back   2. When is this surgery scheduled?  Oct 4th  3. Are there any medications that need to be held prior to surgery and how long? 7 days plavix   4. Name of physician performing surgery?  Dr Drema Dallas   5. What is your office phone and fax number? Fax [phone] ofc  [phone] x [place] 10/09/2016, 8:33 AM  _________________________________________________________________   (provider comments below)

## 2016-10-12 ENCOUNTER — Encounter: Payer: Self-pay | Admitting: Cardiovascular Disease

## 2016-12-01 ENCOUNTER — Other Ambulatory Visit: Payer: Self-pay | Admitting: Cardiovascular Disease

## 2016-12-01 DIAGNOSIS — I25110 Atherosclerotic heart disease of native coronary artery with unstable angina pectoris: Secondary | ICD-10-CM

## 2017-01-02 ENCOUNTER — Other Ambulatory Visit: Payer: Self-pay | Admitting: Cardiovascular Disease

## 2017-02-05 ENCOUNTER — Encounter: Payer: Self-pay | Admitting: Cardiovascular Disease

## 2017-02-08 ENCOUNTER — Encounter: Payer: Self-pay | Admitting: Cardiovascular Disease

## 2017-05-10 ENCOUNTER — Encounter: Payer: Self-pay | Admitting: Gastroenterology

## 2017-05-12 ENCOUNTER — Encounter: Payer: Self-pay | Admitting: Nurse Practitioner

## 2017-05-12 ENCOUNTER — Ambulatory Visit (INDEPENDENT_AMBULATORY_CARE_PROVIDER_SITE_OTHER): Payer: Medicare Other | Admitting: Nurse Practitioner

## 2017-05-12 VITALS — BP 106/63 | HR 70 | Temp 97.4°F | Ht 62.0 in | Wt 123.4 lb

## 2017-05-12 DIAGNOSIS — K5900 Constipation, unspecified: Secondary | ICD-10-CM

## 2017-05-12 DIAGNOSIS — K2270 Barrett's esophagus without dysplasia: Secondary | ICD-10-CM

## 2017-05-12 NOTE — Assessment & Plan Note (Addendum)
The patient has had chronic constipation for years typically controlled well with stool softeners.  Colonoscopy is up-to-date, next due next year.  She has had some worsening constipation in the past 2 weeks.  Is associated with mild lower abdominal cramping on exam, nausea, bloating.  The only thing that is helped has been enemas with successful passage of stools at that point.  At this point I will try her on Linzess 72 mcg daily.  We will provide samples for 1 to 2 weeks and request a progress report 1 to 2 weeks.  Return for follow-up in 2 months.  Based on progress report we can titrate dose versus other agents.  If she has continued difficult to control constipation we can plan for early interval colonoscopy.  No other red flag/warning signs or symptoms.

## 2017-05-12 NOTE — Assessment & Plan Note (Signed)
History of Barrett's esophagus last EGD in 2013.  At that point she declined repeat EGD.  I had a detailed discussion with the patient, per HPI.  She continues to decline.  Recommend she continue to take PPI.  Follow-up as needed.

## 2017-05-12 NOTE — Patient Instructions (Signed)
1. We will try you on Linzess 72 mcg.  Take this once a day, on an empty stomach. 2. We will provide you with 1 to 2 weeks of samples of Linzess. 3. Call us in 1 to 2 weeks and let us know if this is helping her constipation. 4. We can make changes to the dose, or try other medications depending on how you react to it. 5. Follow-up in 2 months. 6. Call us if you have any questions or concerns.   It was great to meet you today!  I hope you have a wonderful start to Summer!!!    At [facility] we value your feedback. You may receive a survey about your visit today. Please share your experience as we strive to create trusting relationships with our patients to provide genuine, compassionate, quality care.

## 2017-05-12 NOTE — Progress Notes (Signed)
Primary Care Physician:  System, Provider Not In Primary Gastroenterologist:  Dr. Oneida Alar  Chief Complaint  Patient presents with  . Constipation    HPI:   Rhonda Hughes is a 77 y.o. female who presents the patient.  The patient has not been seen in our office since 02/25/2011 for Barrett's esophagus.  At that time noted to be GERD well-controlled, recommended EGD every 3 years and next due in March 2013.  She was explained Barrett's surveillance and rationale because the patient was questioning the need for repeat EGDs.  Last EGD completed 04/06/2011 was found mild gastritis and Barrett's esophagus.  Recommended PPI indefinitely, biopsies found to be stridorous with reactive changes, no advanced Barrett's changes.  Recommended repeat EGD in 3 years, colonoscopy in 2020.  Patient had a follow-up visit in April 2016 but canceled because she did not want to do a colonoscopy without a pill prep, even though she was not due until 2020.  Recommended she keep her appointment for EGD.  She stated she was reluctant to have a repeat EGD because she is not having any problems.  She did not come for her appointment and has been lost to follow-up.  Today she states she's doing ok overall. Has taken stool softeners for years. Two weeks ago constipation worsened, was given MiraLAX which didn't help. The only that has helped recently is an enema. Denies significant diet changes, abdominal pain, vomiting. Has had some nausea with all this. Has had some decreased appetite with constipation. Stools are loose when she is able to have a bowel movement. Denies any new medications. Denies hematochezia, fever, chills. Has had some recent weight loss 2 weeks of about 3-4 lbs (due to decreased appetite.) Denies GERD symptoms.  I had a detailed discussion with the patient about Barrett's Esophagus and the need for q 3 year EGD surveillance. This included the risks of declining EGD. She verbalized understanding and declined  EGD surveillance.   Past Medical History:  Diagnosis Date  . Anxiety   . Barrett's esophagus without dysplasia   . CAD (coronary artery disease)    a. LHC 10/16/11: dLM 20%, mLAD 90%, pOM1 30%, mOM1 70% (FFR not hemodynamically significant), prox and mid RCA 30%, EF 65%;  b. PCI 10/16/11: Promus DES x 2 to mLAD  . COPD (chronic obstructive pulmonary disease) ([place])   . Depression   . Diverticulosis   . Essential hypertension   . GERD (gastroesophageal reflux disease)   . Hyperlipidemia   . Rectocele     Past Surgical History:  Procedure Laterality Date  . ABDOMINAL HYSTERECTOMY    . CARDIAC CATHETERIZATION    . CARDIAC CATHETERIZATION N/A 06/21/2015   Procedure: Right/Left Heart Cath and Coronary Angiography;  Surgeon: [name], MD;  Location: [facility];  Service: Cardiovascular;  Laterality: N/A;  . COLONOSCOPY  01/2008   Multiple diverticula in desc and sigm colon  . ESOPHAGOGASTRODUODENOSCOPY  01/2008   Barrett's esophagus, gastritis, no h.pylori, due follow-up 01/2011  . ESOPHAGOGASTRODUODENOSCOPY  04/06/2011   Procedure: ESOPHAGOGASTRODUODENOSCOPY (EGD);  Surgeon: Danie Binder, MD;  [facility];  Service: Endoscopy;  Laterality: N/A;  8:30  . FLEXIBLE SIGMOIDOSCOPY  08/11/2010 RECTAL PAIN/PRESSURE   SML IH, TICS  . FRACTIONAL FLOW RESERVE WIRE N/A 10/16/2011   Procedure: FRACTIONAL FLOW RESERVE WIRE;  Surgeon: [name], MD;  [facility] LAB;  Service: Cardiovascular;  Laterality: N/A;  . LEFT HEART CATHETERIZATION WITH CORONARY ANGIOGRAM N/A  03/07/2012   Procedure: LEFT HEART CATHETERIZATION WITH CORONARY ANGIOGRAM;  Surgeon: [name], MD;  Location: [facility] CATH LAB;  Service: Cardiovascular;  Laterality: N/A;  . LEFT HEART CATHETERIZATION WITH CORONARY ANGIOGRAM N/A 12/14/2012   Procedure: LEFT HEART CATHETERIZATION WITH CORONARY ANGIOGRAM;  Surgeon: [name], MD;  Location: [facility] CATH LAB;  Service:  Cardiovascular;  Laterality: N/A;  . LEFT HEART CATHETERIZATION WITH CORONARY ANGIOGRAM N/A 05/07/2014   Procedure: LEFT HEART CATHETERIZATION WITH CORONARY ANGIOGRAM;  Surgeon: [name], MD;  Location: [facility] CATH LAB;  Service: Cardiovascular;  Laterality: N/A;  . PARTIAL HYSTERECTOMY  1980s    Current Outpatient Medications  Medication Sig Dispense Refill  . ALPRAZolam (XANAX) 0.25 MG tablet Take 0.25 mg by mouth daily as needed. For anxiety    . atorvastatin (LIPITOR) 10 MG tablet Take 10 mg by mouth at bedtime.     . Biotin 5000 MCG CAPS Take 1 capsule by mouth daily.     . ciprofloxacin (CIPRO) 500 MG tablet Take 1 tablet by mouth 2 (two) times daily.    . clopidogrel (PLAVIX) 75 MG tablet TAKE 1 TABLET BY MOUTH  DAILY WITH BREAKFAST 90 tablet 2  . diphenhydramine-acetaminophen (TYLENOL PM) 25-500 MG TABS Take 2 tablets by mouth at bedtime as needed (Sleep).    . docusate sodium (COLACE) 100 MG capsule Take 100-200 mg by mouth as needed.     . isosorbide mononitrate (IMDUR) 30 MG 24 hr tablet TAKE 1 TABLET BY MOUTH  DAILY 90 tablet 2  . lisinopril-hydrochlorothiazide (PRINZIDE,ZESTORETIC) 20-12.5 MG tablet Take 1 tablet by mouth daily.    . memantine (NAMENDA) 10 MG tablet Take 5 mg by mouth 2 (two) times daily.    . pantoprazole (PROTONIX) 20 MG tablet Take 20 mg by mouth daily.    . Polyvinyl Alcohol-Povidone (REFRESH OP) Apply 2 drops to eye daily as needed (Scratchy eyes).    . potassium chloride SA (K-DUR,KLOR-CON) 20 MEQ tablet Take 40 mEq by mouth daily.     Marland Kitchen venlafaxine XR (EFFEXOR-XR) 37.5 MG 24 hr capsule Take 37.5 mg by mouth at bedtime.     No current facility-administered medications for this visit.     Allergies as of 05/12/2017 - Review Complete 05/12/2017  Allergen Reaction Noted  . Adhesive [tape] Other (See Comments) 04/01/2011  . Sulfa antibiotics Other (See Comments) 06/18/2010    Family History  Problem Relation Age of Onset  . Coronary artery  disease Father 64  . Heart attack Father 3  . GI problems Mother        Perforated colon   . Coronary artery disease Paternal Aunt   . Coronary artery disease Paternal Uncle   . Colon cancer Neg Hx   . Gastric cancer Neg Hx   . Esophageal cancer Neg Hx     Social History   Socioeconomic History  . Marital status: Married    Spouse name: Not on file  . Number of children: 1  . Years of education: Not on file  . Highest education level: Not on file  Occupational History  . Occupation: Retired Child psychotherapist    Comment: retired    Fish farm manager: RETIRED  Social Needs  . Financial resource strain: Not on file  . Food insecurity:    Worry: Not on file    Inability: Not on file  . Transportation needs:    Medical: Not on file    Non-medical: Not on file  Tobacco Use  . Smoking status:  Former Smoker    Packs/day: 1.00    Years: 53.00    Pack years: 53.00    Types: Cigarettes    Last attempt to quit: 03/20/2007    Years since quitting: 10.1  . Smokeless tobacco: Never Used  Substance and Sexual Activity  . Alcohol use: No  . Drug use: No  . Sexual activity: Yes  Lifestyle  . Physical activity:    Days per week: Not on file    Minutes per session: Not on file  . Stress: Not on file  Relationships  . Social connections:    Talks on phone: Not on file    Gets together: Not on file    Attends religious service: Not on file    Active member of club or organization: Not on file    Attends meetings of clubs or organizations: Not on file    Relationship status: Not on file  . Intimate partner violence:    Fear of current or ex partner: Not on file    Emotionally abused: Not on file    Physically abused: Not on file    Forced sexual activity: Not on file  Other Topics Concern  . Not on file  Social History Narrative  . Not on file    Review of Systems: Complete ROS negative except as per HPI.    Physical Exam: BP 106/63   Pulse 70   Temp (!) 97.4 F (36.3  C) (Oral)   Ht 5\' 2"  (1.575 m)   Wt 123 lb 6.4 oz (56 kg)   BMI 22.57 kg/m  General:   Alert and oriented. Pleasant and cooperative. Well-nourished and well-developed.  Eyes:  Without icterus, sclera clear and conjunctiva pink.  Ears:  Normal auditory acuity. Cardiovascular:  S1, S2 present without murmurs appreciated. Extremities without clubbing or edema. Respiratory:  Clear to auscultation bilaterally. No wheezes, rales, or rhonchi. No distress.  Gastrointestinal:  +BS, soft, and non-distended. Mild LLQ TTP. No HSM noted. No guarding or rebound. No masses appreciated.  Rectal:  Deferred  Musculoskalatal:  Symmetrical without gross deformities. Neurologic:  Alert and oriented x4;  grossly normal neurologically. Psych:  Alert and cooperative. Normal mood and affect. Heme/Lymph/Immune: No excessive bruising noted.    05/12/2017 11:24 AM   Disclaimer: This note was dictated with voice recognition software. Similar sounding words can inadvertently be transcribed and may not be corrected upon review.

## 2017-05-16 ENCOUNTER — Inpatient Hospital Stay (HOSPITAL_COMMUNITY)
Admission: EM | Admit: 2017-05-16 | Discharge: 2017-05-19 | DRG: 683 | Disposition: A | Discharge status: Home / Self Care | Payer: Medicare Other | Attending: Internal Medicine | Admitting: Internal Medicine

## 2017-05-16 ENCOUNTER — Other Ambulatory Visit: Payer: Self-pay

## 2017-05-16 ENCOUNTER — Encounter (HOSPITAL_COMMUNITY): Payer: Self-pay | Admitting: *Deleted

## 2017-05-16 DIAGNOSIS — M898X9 Other specified disorders of bone, unspecified site: Secondary | ICD-10-CM | POA: Diagnosis present

## 2017-05-16 DIAGNOSIS — Z79899 Other long term (current) drug therapy: Secondary | ICD-10-CM | POA: Diagnosis not present

## 2017-05-16 DIAGNOSIS — K2270 Barrett's esophagus without dysplasia: Secondary | ICD-10-CM | POA: Diagnosis present

## 2017-05-16 DIAGNOSIS — Z87440 Personal history of urinary (tract) infections: Secondary | ICD-10-CM

## 2017-05-16 DIAGNOSIS — Z91048 Other nonmedicinal substance allergy status: Secondary | ICD-10-CM

## 2017-05-16 DIAGNOSIS — K219 Gastro-esophageal reflux disease without esophagitis: Secondary | ICD-10-CM | POA: Diagnosis present

## 2017-05-16 DIAGNOSIS — I10 Essential (primary) hypertension: Secondary | ICD-10-CM

## 2017-05-16 DIAGNOSIS — I129 Hypertensive chronic kidney disease with stage 1 through stage 4 chronic kidney disease, or unspecified chronic kidney disease: Secondary | ICD-10-CM | POA: Diagnosis present

## 2017-05-16 DIAGNOSIS — R638 Other symptoms and signs concerning food and fluid intake: Secondary | ICD-10-CM | POA: Diagnosis not present

## 2017-05-16 DIAGNOSIS — F419 Anxiety disorder, unspecified: Secondary | ICD-10-CM | POA: Diagnosis present

## 2017-05-16 DIAGNOSIS — N182 Chronic kidney disease, stage 2 (mild): Secondary | ICD-10-CM | POA: Diagnosis present

## 2017-05-16 DIAGNOSIS — D649 Anemia, unspecified: Secondary | ICD-10-CM | POA: Diagnosis present

## 2017-05-16 DIAGNOSIS — Z7902 Long term (current) use of antithrombotics/antiplatelets: Secondary | ICD-10-CM

## 2017-05-16 DIAGNOSIS — N179 Acute kidney failure, unspecified: Secondary | ICD-10-CM | POA: Diagnosis not present

## 2017-05-16 DIAGNOSIS — Z90711 Acquired absence of uterus with remaining cervical stump: Secondary | ICD-10-CM | POA: Diagnosis not present

## 2017-05-16 DIAGNOSIS — F329 Major depressive disorder, single episode, unspecified: Secondary | ICD-10-CM | POA: Diagnosis present

## 2017-05-16 DIAGNOSIS — Z882 Allergy status to sulfonamides status: Secondary | ICD-10-CM | POA: Diagnosis not present

## 2017-05-16 DIAGNOSIS — Z87891 Personal history of nicotine dependence: Secondary | ICD-10-CM

## 2017-05-16 DIAGNOSIS — E860 Dehydration: Secondary | ICD-10-CM | POA: Diagnosis present

## 2017-05-16 DIAGNOSIS — N170 Acute kidney failure with tubular necrosis: Secondary | ICD-10-CM | POA: Diagnosis not present

## 2017-05-16 DIAGNOSIS — Z955 Presence of coronary angioplasty implant and graft: Secondary | ICD-10-CM

## 2017-05-16 DIAGNOSIS — K5900 Constipation, unspecified: Secondary | ICD-10-CM | POA: Diagnosis present

## 2017-05-16 DIAGNOSIS — E876 Hypokalemia: Secondary | ICD-10-CM | POA: Diagnosis present

## 2017-05-16 DIAGNOSIS — E785 Hyperlipidemia, unspecified: Secondary | ICD-10-CM | POA: Diagnosis present

## 2017-05-16 DIAGNOSIS — I25110 Atherosclerotic heart disease of native coronary artery with unstable angina pectoris: Secondary | ICD-10-CM | POA: Diagnosis present

## 2017-05-16 DIAGNOSIS — J449 Chronic obstructive pulmonary disease, unspecified: Secondary | ICD-10-CM | POA: Diagnosis present

## 2017-05-16 DIAGNOSIS — Z8249 Family history of ischemic heart disease and other diseases of the circulatory system: Secondary | ICD-10-CM

## 2017-05-16 DIAGNOSIS — K5909 Other constipation: Secondary | ICD-10-CM | POA: Diagnosis not present

## 2017-05-16 HISTORY — DX: Acute kidney failure, unspecified: N17.9

## 2017-05-16 LAB — BASIC METABOLIC PANEL
ANION GAP: 12 (ref 5–15)
BUN: 19 mg/dL (ref 6–20)
CALCIUM: 9.8 mg/dL (ref 8.9–10.3)
CO2: 23 mmol/L (ref 22–32)
Chloride: 104 mmol/L (ref 101–111)
Creatinine, Ser: 2.85 mg/dL — ABNORMAL HIGH (ref 0.44–1.00)
GFR calc Af Amer: 18 mL/min — ABNORMAL LOW (ref >60)
GFR, EST NON AFRICAN AMERICAN: 15 mL/min — AB (ref >60)
Glucose, Bld: 128 mg/dL — ABNORMAL HIGH (ref 65–99)
POTASSIUM: 4.2 mmol/L (ref 3.5–5.1)
SODIUM: 139 mmol/L (ref 135–145)

## 2017-05-16 LAB — CBC WITH DIFFERENTIAL/PLATELET
BASOS ABS: 0 10*3/uL (ref 0.0–0.1)
Basophils Relative: 0 %
EOS ABS: 0.1 10*3/uL (ref 0.0–0.7)
EOS PCT: 1 %
HCT: 34.7 % — ABNORMAL LOW (ref 36.0–46.0)
Hemoglobin: 11.3 g/dL — ABNORMAL LOW (ref 12.0–15.0)
Lymphocytes Relative: 9 %
Lymphs Abs: 1 10*3/uL (ref 0.7–4.0)
MCH: 31 pg (ref 26.0–34.0)
MCHC: 32.6 g/dL (ref 30.0–36.0)
MCV: 95.1 fL (ref 78.0–100.0)
MONO ABS: 0.7 10*3/uL (ref 0.1–1.0)
Monocytes Relative: 7 %
Neutro Abs: 9.1 10*3/uL — ABNORMAL HIGH (ref 1.7–7.7)
Neutrophils Relative %: 83 %
PLATELETS: 303 10*3/uL (ref 150–400)
RBC: 3.65 MIL/uL — AB (ref 3.87–5.11)
RDW: 12.2 % (ref 11.5–15.5)
WBC: 10.9 10*3/uL — AB (ref 4.0–10.5)

## 2017-05-16 LAB — SODIUM, URINE, RANDOM: Sodium, Ur: 75 mmol/L

## 2017-05-16 LAB — URINALYSIS, ROUTINE W REFLEX MICROSCOPIC
Bacteria, UA: NONE SEEN
Bilirubin Urine: NEGATIVE
Glucose, UA: NEGATIVE mg/dL
Ketones, ur: NEGATIVE mg/dL
Nitrite: NEGATIVE
PROTEIN: NEGATIVE mg/dL
Specific Gravity, Urine: 1.008 (ref 1.005–1.030)
pH: 5 (ref 5.0–8.0)

## 2017-05-16 LAB — CREATININE, URINE, RANDOM: CREATININE, URINE: 39.66 mg/dL

## 2017-05-16 MED ORDER — DIPHENHYDRAMINE-APAP (SLEEP) 25-500 MG PO TABS: 2.0000 | ORAL_TABLET | Freq: Every evening | ORAL | Status: DC | PRN

## 2017-05-16 MED ORDER — VENLAFAXINE HCL ER 37.5 MG PO CP24
37.5000 mg | ORAL_CAPSULE | Freq: Every day | ORAL | Status: DC
  Administered 2017-05-16 – 2017-05-18 (×3): 37.5 mg via ORAL
  Filled 2017-05-16 (×5): qty 1

## 2017-05-16 MED ORDER — ISOSORBIDE MONONITRATE ER 60 MG PO TB24
30.0000 mg | ORAL_TABLET | Freq: Every day | ORAL | Status: DC
  Administered 2017-05-16 – 2017-05-19 (×4): 30 mg via ORAL
  Filled 2017-05-16 (×5): qty 1

## 2017-05-16 MED ORDER — HEPARIN SODIUM (PORCINE) 5000 UNIT/ML IJ SOLN
5000.0000 [IU] | Freq: Three times a day (TID) | INTRAMUSCULAR | Status: DC
  Administered 2017-05-18 – 2017-05-19 (×2): 5000 [IU] via SUBCUTANEOUS
  Filled 2017-05-16 (×6): qty 1

## 2017-05-16 MED ORDER — ALPRAZOLAM 0.25 MG PO TABS
0.2500 mg | ORAL_TABLET | Freq: Every evening | ORAL | Status: DC | PRN
Start: 2017-05-16 — End: 2017-05-19

## 2017-05-16 MED ORDER — BIOTIN 5000 MCG PO CAPS: 1.0000 | ORAL_CAPSULE | Freq: Every day | ORAL | Status: DC

## 2017-05-16 MED ORDER — SODIUM CHLORIDE 0.9 % IV SOLN
INTRAVENOUS | Status: DC
  Administered 2017-05-16 – 2017-05-19 (×7): via INTRAVENOUS

## 2017-05-16 MED ORDER — DIPHENHYDRAMINE HCL 25 MG PO CAPS: 50.0000 mg | ORAL_CAPSULE | Freq: Every evening | ORAL | Status: DC | PRN

## 2017-05-16 MED ORDER — POLYVINYL ALCOHOL 1.4 % OP SOLN: 2.0000 [drp] | OPHTHALMIC | Status: DC | PRN

## 2017-05-16 MED ORDER — SENNOSIDES-DOCUSATE SODIUM 8.6-50 MG PO TABS
1.0000 | ORAL_TABLET | Freq: Two times a day (BID) | ORAL | Status: AC
  Administered 2017-05-16 – 2017-05-18 (×4): 1 via ORAL
  Filled 2017-05-16 (×5): qty 1

## 2017-05-16 MED ORDER — SODIUM CHLORIDE 0.9 % IV BOLUS
1000.0000 mL | Freq: Once | INTRAVENOUS | Status: AC
  Administered 2017-05-16: 1000 mL via INTRAVENOUS

## 2017-05-16 MED ORDER — ACETAMINOPHEN 500 MG PO TABS: 1000.0000 mg | ORAL_TABLET | Freq: Every evening | ORAL | Status: DC | PRN

## 2017-05-16 MED ORDER — PANTOPRAZOLE SODIUM 20 MG PO TBEC
20.0000 mg | DELAYED_RELEASE_TABLET | Freq: Every day | ORAL | Status: DC
  Filled 2017-05-16 (×2): qty 1

## 2017-05-16 MED ORDER — ONDANSETRON HCL 4 MG PO TABS: 4.0000 mg | ORAL_TABLET | Freq: Four times a day (QID) | ORAL | Status: DC | PRN

## 2017-05-16 MED ORDER — CLOPIDOGREL BISULFATE 75 MG PO TABS
75.0000 mg | ORAL_TABLET | Freq: Every day | ORAL | Status: DC
  Administered 2017-05-17 – 2017-05-19 (×3): 75 mg via ORAL
  Filled 2017-05-16 (×4): qty 1

## 2017-05-16 MED ORDER — MEMANTINE HCL 10 MG PO TABS
5.0000 mg | ORAL_TABLET | Freq: Two times a day (BID) | ORAL | Status: DC
  Administered 2017-05-16 – 2017-05-19 (×6): 5 mg via ORAL
  Filled 2017-05-16 (×7): qty 1

## 2017-05-16 MED ORDER — PANTOPRAZOLE SODIUM 40 MG PO TBEC
40.0000 mg | DELAYED_RELEASE_TABLET | Freq: Every day | ORAL | Status: DC
  Administered 2017-05-16 – 2017-05-19 (×4): 40 mg via ORAL
  Filled 2017-05-16 (×5): qty 1

## 2017-05-16 MED ORDER — POLYETHYLENE GLYCOL 3350 17 G PO PACK: 17.0000 g | PACK | Freq: Every day | ORAL | Status: DC | PRN

## 2017-05-16 MED ORDER — ACETAMINOPHEN 650 MG RE SUPP: 650.0000 mg | Freq: Four times a day (QID) | RECTAL | Status: DC | PRN

## 2017-05-16 MED ORDER — ATORVASTATIN CALCIUM 10 MG PO TABS
10.0000 mg | ORAL_TABLET | Freq: Every day | ORAL | Status: DC
  Administered 2017-05-16 – 2017-05-18 (×3): 10 mg via ORAL
  Filled 2017-05-16 (×3): qty 1

## 2017-05-16 MED ORDER — ONDANSETRON HCL 4 MG/2ML IJ SOLN
4.0000 mg | Freq: Four times a day (QID) | INTRAMUSCULAR | Status: DC | PRN
  Administered 2017-05-16: 4 mg via INTRAVENOUS
  Filled 2017-05-16: qty 2

## 2017-05-16 MED ORDER — ACETAMINOPHEN 325 MG PO TABS: 650.0000 mg | ORAL_TABLET | Freq: Four times a day (QID) | ORAL | Status: DC | PRN

## 2017-05-16 NOTE — ED Provider Notes (Signed)
[facility] Provider Note   CSN: [id number] Arrival date & time: 05/16/17  0522     History   Chief Complaint Chief Complaint  Patient presents with  . Abdominal Pain    HPI Rhonda Hughes is a 77 y.o. female.  The history is provided by the patient and the spouse.  Abdominal Pain   This is a new problem. The current episode started more than 1 week ago. The problem occurs daily. The problem has not changed since onset.The pain is located in the suprapubic region. The pain is mild. Associated symptoms include constipation and dysuria. Pertinent negatives include fever, diarrhea and vomiting. The symptoms are aggravated by urination. Nothing relieves the symptoms.   Patient with History of anxiety, CAD, COPD presents with suprapubic pain difficulty urinating.  She reports over the past several weeks she has felt like she had a UTI.  She reports is been having urinary frequency with minimal urine output.  She reports she has been on both Macrobid and Cipro without any improvement.  No fevers or vomiting.  No flank pain.  No history of kidney stones Past Medical History:  Diagnosis Date  . Anxiety   . Barrett's esophagus without dysplasia   . CAD (coronary artery disease)    a. LHC 10/16/11: dLM 20%, mLAD 90%, pOM1 30%, mOM1 70% (FFR not hemodynamically significant), prox and mid RCA 30%, EF 65%;  b. PCI 10/16/11: Promus DES x 2 to mLAD  . COPD (chronic obstructive pulmonary disease) ([place])   . Depression   . Diverticulosis   . Essential hypertension   . GERD (gastroesophageal reflux disease)   . Hyperlipidemia   . Rectocele     Patient Active Problem List   Diagnosis Date Noted  . Constipation 05/12/2017  . Coronary artery disease involving native coronary artery of native heart with unstable angina pectoris ([place])   . Esophageal reflux   . CAD (coronary artery disease), native coronary artery 04/24/2015  . Hyponatremia 04/24/2015  . Pain in the chest     . 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 Hypokalemia 03/08/2012  . HTN (hypertension)   . Chest pain, atypical 10/13/2011  . At risk for coronary artery disease 10/13/2011  . 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 Barrett's esophagus 02/25/2011  . Rectal pressure 06/18/2010  . Bowel habit changes 06/18/2010    Past Surgical History:  Procedure Laterality Date  . ABDOMINAL HYSTERECTOMY    . CARDIAC CATHETERIZATION    . CARDIAC CATHETERIZATION N/A 06/21/2015   Procedure: Right/Left Heart Cath and Coronary Angiography;  Surgeon: [name], MD;  Location: [facility];  Service: Cardiovascular;  Laterality: N/A;  . COLONOSCOPY  01/2008   Multiple diverticula in desc and sigm colon  . ESOPHAGOGASTRODUODENOSCOPY  01/2008   Barrett's esophagus, gastritis, no h.pylori, due follow-up 01/2011  . ESOPHAGOGASTRODUODENOSCOPY  04/06/2011   Procedure: ESOPHAGOGASTRODUODENOSCOPY (EGD);  Surgeon: Danie Binder, MD;  [facility];  Service: Endoscopy;  Laterality: N/A;  8:30  . FLEXIBLE SIGMOIDOSCOPY  08/11/2010 RECTAL PAIN/PRESSURE   SML IH, TICS  . FRACTIONAL FLOW RESERVE WIRE N/A 10/16/2011   Procedure: FRACTIONAL FLOW RESERVE WIRE;  Surgeon: [name], MD;  Location: [facility] CATH LAB;  Service: Cardiovascular;  Laterality: N/A;  . LEFT HEART CATHETERIZATION WITH CORONARY ANGIOGRAM N/A 03/07/2012   Procedure: LEFT HEART CATHETERIZATION WITH CORONARY ANGIOGRAM;  Surgeon: [name],  MD;  Location: [facility] CATH LAB;  Service: Cardiovascular;  Laterality: N/A;  . LEFT HEART CATHETERIZATION WITH CORONARY ANGIOGRAM N/A 12/14/2012   Procedure: LEFT HEART CATHETERIZATION WITH CORONARY ANGIOGRAM;  Surgeon: [name], MD;  Location: [facility] CATH LAB;  Service: Cardiovascular;  Laterality: N/A;  . LEFT HEART CATHETERIZATION WITH CORONARY ANGIOGRAM N/A 05/07/2014   Procedure:  LEFT HEART CATHETERIZATION WITH CORONARY ANGIOGRAM;  Surgeon: [name], MD;  Location: [facility] CATH LAB;  Service: Cardiovascular;  Laterality: N/A;  . PARTIAL HYSTERECTOMY  1980s     OB History   None      Home Medications    Prior to Admission medications   Medication Sig Start Date End Date Taking? Authorizing Provider  ALPRAZolam (XANAX) 0.25 MG tablet Take 0.25 mg by mouth daily as needed. For anxiety    [provider]  atorvastatin (LIPITOR) 10 MG tablet Take 10 mg by mouth at bedtime.     [provider]  Biotin 5000 MCG CAPS Take 1 capsule by mouth daily.     [provider]  ciprofloxacin (CIPRO) 500 MG tablet Take 1 tablet by mouth 2 (two) times daily. 05/11/17   [provider]  clopidogrel (PLAVIX) 75 MG tablet TAKE 1 TABLET BY MOUTH  DAILY WITH BREAKFAST 12/01/16   [name], MD  diphenhydramine-acetaminophen (TYLENOL PM) 25-500 MG TABS Take 2 tablets by mouth at bedtime as needed (Sleep).    [provider]  docusate sodium (COLACE) 100 MG capsule Take 100-200 mg by mouth as needed.     [provider]  isosorbide mononitrate (IMDUR) 30 MG 24 hr tablet TAKE 1 TABLET BY MOUTH  DAILY 01/04/17   [name], MD  lisinopril-hydrochlorothiazide (PRINZIDE,ZESTORETIC) 20-12.5 MG tablet Take 1 tablet by mouth daily.    [provider]  memantine (NAMENDA) 10 MG tablet Take 5 mg by mouth 2 (two) times daily. 05/04/17   [provider]  pantoprazole (PROTONIX) 20 MG tablet Take 20 mg by mouth daily.    [provider]  Polyvinyl Alcohol-Povidone (REFRESH OP) Apply 2 drops to eye daily as needed (Scratchy eyes).    [provider]  potassium chloride SA (K-DUR,KLOR-CON) 20 MEQ tablet Take 40 mEq by mouth daily.     [provider]  venlafaxine XR (EFFEXOR-XR) 37.5 MG 24 hr capsule Take 37.5 mg by mouth at bedtime.    [provider]  niacin  (NIASPAN) 500 MG CR tablet Take 500 mg by mouth at bedtime.    04/01/11  [provider]    Family History Family History  Problem Relation Age of Onset  . Coronary artery disease Father 27  . Heart attack Father 29  . GI problems Mother        Perforated colon   . Coronary artery disease Paternal Aunt   . Coronary artery disease Paternal Uncle   . Colon cancer Neg Hx   . Gastric cancer Neg Hx   . Esophageal cancer Neg Hx     Social History Social History   Tobacco Use  . Smoking status: Former Smoker    Packs/day: 1.00    Years: 53.00    Pack years: 53.00    Types: Cigarettes    Last attempt to quit: 03/20/2007    Years since quitting: 10.1  . Smokeless tobacco: Never Used  Substance Use Topics  . Alcohol use: No  . Drug use: No     Allergies   Adhesive [tape] and  Sulfa antibiotics   Review of Systems Review of Systems  Constitutional: Negative for fever.  Gastrointestinal: Positive for abdominal pain and constipation. Negative for diarrhea and vomiting.  Genitourinary: Positive for dysuria. Negative for flank pain.  All other systems reviewed and are negative.    Physical Exam Updated Vital Signs BP (!) 154/64 (BP Location: Left Arm)   Pulse 67   Temp 98.3 F (36.8 C) (Oral)   Resp 16   Ht 1.575 m (5\' 2" )   Wt 55.8 kg (123 lb)   SpO2 100%   BMI 22.50 kg/m   Physical Exam CONSTITUTIONAL: Well developed/well nourished HEAD: Normocephalic/atraumatic EYES: EOMI/PERRL ENMT: Mucous membranes moist NECK: supple no meningeal signs SPINE/BACK:entire spine nontender CV: S1/S2 noted, no murmurs/rubs/gallops noted LUNGS: Lungs are clear to auscultation bilaterally, no apparent distress ABDOMEN: soft, mild suprapubic tenderness, no rebound or guarding, bowel sounds noted throughout abdomen GU:no cva tenderness NEURO: Pt is awake/alert/appropriate, moves all extremitiesx4.  No facial droop.   EXTREMITIES: pulses normal/equal, full ROM SKIN: warm,  color normal PSYCH: no abnormalities of mood noted, alert and oriented to situation   ED Treatments / Results  Labs (all labs ordered are listed, but only abnormal results are displayed) Labs Reviewed  CBC WITH DIFFERENTIAL/PLATELET - Abnormal; Notable for the following components:      Result Value   WBC 10.9 (*)    RBC 3.65 (*)    Hemoglobin 11.3 (*)    HCT 34.7 (*)    Neutro Abs 9.1 (*)    All other components within normal limits  BASIC METABOLIC PANEL - Abnormal; Notable for the following components:   Glucose, Bld 128 (*)    Creatinine, Ser 2.85 (*)    GFR calc non Af Amer 15 (*)    GFR calc Af Amer 18 (*)    All other components within normal limits  URINALYSIS, ROUTINE W REFLEX MICROSCOPIC - Abnormal; Notable for the following components:   Color, Urine STRAW (*)    Hgb urine dipstick SMALL (*)    Leukocytes, UA TRACE (*)    All other components within normal limits  URINE CULTURE    EKG None  Radiology No results found.  Procedures Procedures  Medications Ordered in ED Medications  sodium chloride 0.9 % bolus 1,000 mL (1,000 mLs Intravenous New Bag/Given 05/16/17 0735)     Initial Impression / Assessment and Plan / ED Course  I have reviewed the triage vital signs and the nursing notes.  Pertinent labs  results that were available during my care of the patient were reviewed by me and considered in my medical decision making (see chart for details).     Patient With new onset acute kidney injury.  She does not have any obvious signs of urinary retention on bladder scan.  Nursing staff reports bladder volume of 17 mL's   Suspect this may be due to medications, she is on lisinopril and HCTZ She has no significant abdominal or flank tenderness to suggest need for emergent imaging  Will admit, d/w dr Clementeen Graham for admission  Final Clinical Impressions(s) / ED Diagnoses   Final diagnoses:  AKI (acute kidney injury) [facility])    ED Discharge Orders    None        Ripley Fraise, MD 05/16/17 [phone]

## 2017-05-16 NOTE — ED Triage Notes (Signed)
Pt c/o uti symptoms; pt states she has been having the sx x 2 weeks and is on an antibiotic but pt states she is still having lower abdominal pain and states she is only urinating small amounts at a time; pt is currently on cipro

## 2017-05-16 NOTE — H&P (Signed)
TRH H&P   Patient Demographics:    Rhonda Hughes, is a 77 y.o. female  MRN: [id number]   DOB - [birth year]  Admit Date - 05/16/2017  Outpatient Primary MD for the patient is System, Provider Not In  Referring MD: Dr. Christy Gentles  Outpatient Specialists: None  Patient coming from: Home  Chief Complaint  Patient presents with  . Abdominal Pain      HPI:    Rhonda Hughes  is a 77 y.o. female, with history of coronary artery disease status post LAD stenting in 2013 (last cath in 2017 with patent stents and mild CAD), anxiety, Barrett's esophagus, depression, who presented to the ED with generalized weakness and lower abdominal discomfort.  Symptoms have been ongoing for almost 2 weeks worsened for the past 1 week.  2 weeks ago she was having some abdominal discomfort and dysuria.  She was placed on Macrobid by her PCP which did not improve her symptoms and after a few days she was switched to ciprofloxacin which she is still taking.  Patient reports very poor p.o. intake for the same duration.  She denies any fever but yesterday had some chills.  She feels weak overall and has not been active as usual.  She has been urinating very little for past week. Denies any headache, blurred vision, dizziness, chest pain, nausea, vomiting, further dysuria, denies diarrhea and feels constipated.  Denies any tingling or numbness of her extremities.  Denies use of NSAIDs or any new medications.  Course in the ED Vitals were stable.  Blood work showed WC of 10 point 9K, hemoglobin of 11.3.  Chemistry showed AKI with creatinine of 2.85 (previously normal renal function.) UA was negative for infection.  Patient was given 1 L normal saline bolus and hospitalist consulted for admission for AKI.   Review of systems:    In addition to the HPI above, No Fever, -chills+, No Headache, No changes with Vision  or hearing, No problems swallowing food or Liquids, No Chest pain, Cough or Shortness of Breath, Lower abdominal pain, no Nausea or vomiting, Bowel movements are regular, No Blood in stool or Urine, No dysuria, No new skin rashes or bruises, No new joints pains-aches,  Generalized weakness, tingling, numbness in any extremity, No recent weight gain or loss,  no polyuria, polydypsia or polyphagia, No significant Mental Stressors.  A full 10 point Review of Systems was done, except as stated above, all other Review of Systems were negative.   With Past History of the following :    Past Medical History:  Diagnosis Date  . AKI (acute kidney injury) ([place]) 05/16/2017  . Anxiety   . Barrett's esophagus without dysplasia   . CAD (coronary artery disease)    a. LHC 10/16/11: dLM 20%, mLAD 90%, pOM1 30%, mOM1 70% (FFR not hemodynamically significant), prox and mid RCA 30%, EF 65%;  b.  PCI 10/16/11: Promus DES x 2 to mLAD  . COPD (chronic obstructive pulmonary disease) ([place])   . Depression   . Diverticulosis   . Essential hypertension   . GERD (gastroesophageal reflux disease)   . Hyperlipidemia   . Rectocele       Past Surgical History:  Procedure Laterality Date  . ABDOMINAL HYSTERECTOMY    . CARDIAC CATHETERIZATION    . CARDIAC CATHETERIZATION N/A 06/21/2015   Procedure: Right/Left Heart Cath and Coronary Angiography;  Surgeon: [name], MD;  Location: [facility] [facility];  Service: Cardiovascular;  Laterality: N/A;  . COLONOSCOPY  01/2008   Multiple diverticula in desc and sigm colon  . ESOPHAGOGASTRODUODENOSCOPY  01/2008   Barrett's esophagus, gastritis, no h.pylori, due follow-up 01/2011  . ESOPHAGOGASTRODUODENOSCOPY  04/06/2011   Procedure: ESOPHAGOGASTRODUODENOSCOPY (EGD);  Surgeon: Danie Binder, MD;  [facility];  Service: Endoscopy;  Laterality: N/A;  8:30  . FLEXIBLE SIGMOIDOSCOPY  08/11/2010 RECTAL PAIN/PRESSURE   SML IH, TICS  . FRACTIONAL FLOW  RESERVE WIRE N/A 10/16/2011   Procedure: FRACTIONAL FLOW RESERVE WIRE;  Surgeon: [name], MD;  [facility] C[facility] LAB;  Service: Cardiovascular;  Laterality: N/A;  . LEFT HEART CATHETERIZATION WITH CORONARY ANGIOGRAM N/A 03/07/2012   Procedure: LEFT HEART CATHETERIZATION WITH CORONARY ANGIOGRAM;  Surgeon: [name], MD;  Location: [facility] CATH LAB;  Service: Cardiovascular;  Laterality: N/A;  . LEFT HEART CATHETERIZATION WITH CORONARY ANGIOGRAM N/A 12/14/2012   Procedure: LEFT HEART CATHETERIZATION WITH CORONARY ANGIOGRAM;  Surgeon: [name], MD;  Location: [facility] CATH LAB;  Service: Cardiovascular;  Laterality: N/A;  . LEFT HEART CATHETERIZATION WITH CORONARY ANGIOGRAM N/A 05/07/2014   Procedure: LEFT HEART CATHETERIZATION WITH CORONARY ANGIOGRAM;  Surgeon: [name], MD;  Location: [facility] CATH LAB;  Service: Cardiovascular;  Laterality: N/A;  . PARTIAL HYSTERECTOMY  1980s      Social History:     Social History   Tobacco Use  . Smoking status: Former Smoker    Packs/day: 1.00    Years: 53.00    Pack years: 53.00    Types: Cigarettes    Last attempt to quit: 03/20/2007    Years since quitting: 10.1  . Smokeless tobacco: Never Used  Substance Use Topics  . Alcohol use: No     Lives -home with husband Mobility -independent     Family History :     Family History  Problem Relation Age of Onset  . Coronary artery disease Father 15  . Heart attack Father 73  . GI problems Mother        Perforated colon   . Coronary artery disease Paternal Aunt   . Coronary artery disease Paternal Uncle   . Colon cancer Neg Hx   . Gastric cancer Neg Hx   . Esophageal cancer Neg Hx       Home Medications:   Prior to Admission medications   Medication Sig Start Date End Date Taking? Authorizing Provider  ALPRAZolam (XANAX) 0.25 MG tablet Take 0.25 mg by mouth daily as needed. For anxiety    [provider]  atorvastatin (LIPITOR) 10 MG  tablet Take 10 mg by mouth at bedtime.     [provider]  Biotin 5000 MCG CAPS Take 1 capsule by mouth daily.     [provider]  ciprofloxacin (CIPRO) 500 MG tablet Take 1 tablet by mouth 2 (two) times daily. 05/11/17   [provider]  clopidogrel (PLAVIX) 75 MG tablet  TAKE 1 TABLET BY MOUTH  DAILY WITH BREAKFAST 12/01/16   [name], MD  diphenhydramine-acetaminophen (TYLENOL PM) 25-500 MG TABS Take 2 tablets by mouth at bedtime as needed (Sleep).    [provider]  docusate sodium (COLACE) 100 MG capsule Take 100-200 mg by mouth as needed.     [provider]  isosorbide mononitrate (IMDUR) 30 MG 24 hr tablet TAKE 1 TABLET BY MOUTH  DAILY 01/04/17   [name], MD  lisinopril-hydrochlorothiazide (PRINZIDE,ZESTORETIC) 20-12.5 MG tablet Take 1 tablet by mouth daily.    [provider]  memantine (NAMENDA) 10 MG tablet Take 5 mg by mouth 2 (two) times daily. 05/04/17   [provider]  pantoprazole (PROTONIX) 20 MG tablet Take 20 mg by mouth daily.    [provider]  Polyvinyl Alcohol-Povidone (REFRESH OP) Apply 2 drops to eye daily as needed (Scratchy eyes).    [provider]  potassium chloride SA (K-DUR,KLOR-CON) 20 MEQ tablet Take 40 mEq by mouth daily.     [provider]  venlafaxine XR (EFFEXOR-XR) 37.5 MG 24 hr capsule Take 37.5 mg by mouth at bedtime.    [provider]  niacin (NIASPAN) 500 MG CR tablet Take 500 mg by mouth at bedtime.    04/01/11  [provider]     Allergies:     Allergies  Allergen Reactions  . Adhesive [Tape] Other (See Comments)    Turns skin red  . Sulfa Antibiotics Other (See Comments)    Childhood allergy     Physical Exam:   Vitals  Blood pressure 124/60, pulse 65, temperature 98.3 F (36.8 C), temperature source Oral, resp. rate 18, height 5\' 2"  (1.575 m), weight 55.8 kg (123 lb), SpO2 94 %.  General: Elderly  female lying in bed, appears fatigued, not in distress HEENT: Pupils reactive bilaterally, EOMI, no pallor, no icterus, dry oral mucosa, supple neck Chest: Clear to auscultation bilaterally, no added sounds CVS: Normal S1-S2, no murmurs rub or gallop GI: Soft, nondistended, nontender, bowel sounds present Musculoskeletal: Warm, no edema, normal skin CNS: Alert and oriented, nonfocal   Data Review:    CBC Recent Labs  Lab 05/16/17 0624  WBC 10.9*  HGB 11.3*  HCT 34.7*  PLT 303  MCV 95.1  MCH 31.0  MCHC 32.6  RDW 12.2  LYMPHSABS 1.0  MONOABS 0.7  EOSABS 0.1  BASOSABS 0.0   ------------------------------------------------------------------------------------------------------------------  Chemistries  Recent Labs  Lab 05/16/17 0624  NA 139  K 4.2  CL 104  CO2 23  GLUCOSE 128*  BUN 19  CREATININE 2.85*  CALCIUM 9.8   ------------------------------------------------------------------------------------------------------------------ estimated creatinine clearance is 13.7 mL/min (A) (by C-G formula based on SCr of 2.85 mg/dL (H)). ------------------------------------------------------------------------------------------------------------------ No results for input(s): TSH, T4TOTAL, T3FREE, THYROIDAB in the last 72 hours.  Invalid input(s): FREET3  Coagulation profile No results for input(s): INR, PROTIME in the last 168 hours. ------------------------------------------------------------------------------------------------------------------- No results for input(s): DDIMER in the last 72 hours. -------------------------------------------------------------------------------------------------------------------  Cardiac Enzymes No results for input(s): CKMB, TROPONINI, MYOGLOBIN in the last 168 hours.  Invalid input(s): CK ------------------------------------------------------------------------------------------------------------------    Component Value Date/Time    BNP 6.0 04/24/2015 1740     ---------------------------------------------------------------------------------------------------------------  Urinalysis    Component Value Date/Time   COLORURINE STRAW (A) 05/16/2017 0559   APPEARANCEUR CLEAR 05/16/2017 0559   LABSPEC 1.008 05/16/2017 0559   PHURINE 5.0 05/16/2017 0559   GLUCOSEU NEGATIVE 05/16/2017 0559   HGBUR SMALL (A) 05/16/2017 0559  BILIRUBINUR NEGATIVE 05/16/2017 0559   KETONESUR NEGATIVE 05/16/2017 0559   PROTEINUR NEGATIVE 05/16/2017 0559   NITRITE NEGATIVE 05/16/2017 0559   LEUKOCYTESUR TRACE (A) 05/16/2017 0559    ----------------------------------------------------------------------------------------------------------------   Imaging Results:    No results found.  My personal review of EKG: Not done   Assessment & Plan:    Principal Problem:   AKI (acute kidney injury) ([place]) Suspect prerenal ATN with dehydration with poor p.o. intake for past 2 weeks, UTI and concomitant use of lisinopril-HCTZ and ciprofloxacin. IV hydration with normal saline at 100 cc/h (received 1 L bolus in the ED).  Hold lisinopril-HCTZ and ciprofloxacin. Check urine sodium, creatinine, and osmolality.  Check renal ultrasound to rule out any obstruction or hydronephrosis. Strict urine output monitoring.  Check labs in a.m. consult nephrology if renal function unimproved or worsening.  Active Problems:   Barrett's esophagus   Coronary artery disease involving native coronary artery of native heart with unstable angina pectoris ([place]) Cardiac cath in 06/2015 with double vessel CAD with patent stent in mid LAD and obtuse marginal and mild nonobstructive disease and several other vessels.  On medical management with Plavix, Imdur and statin.     Constipation Add senna-Colace and MiraLAX.  GERD with history of Barrett's esophagus Continue PPI  Dyslipidemia Continue statin and niacin.  Recent UTI Patient has been on antibiotic for almost  2 weeks.  UA negative and does not complain of dysuria.  Will hold off on further antibiotics and monitor.  DVT Prophylaxis Heparin -    AM Labs Ordered, also please review Full Orders  Family Communication: Admission, patients condition and plan of care including tests being ordered have been discussed with the patient and her husband at bedside  Code Status full code  Likely DC to home once hydrated adequately and renal function improved  Condition: Fair  Consults called: None  Admission status: Inpatient  Time spent in minutes : 50   Yaretzy Olazabal M.D on 05/16/2017 at 8:40 AM  Between 7am to 7pm - Pager - [phone]. After 7pm go to [email or web address] - password [facility]  [facility] - Office  [phone]

## 2017-05-17 ENCOUNTER — Inpatient Hospital Stay (HOSPITAL_COMMUNITY): Payer: Medicare Other

## 2017-05-17 DIAGNOSIS — N170 Acute kidney failure with tubular necrosis: Principal | ICD-10-CM

## 2017-05-17 LAB — URINE CULTURE: CULTURE: NO GROWTH

## 2017-05-17 LAB — BASIC METABOLIC PANEL
Anion gap: 9 (ref 5–15)
BUN: 14 mg/dL (ref 6–20)
CHLORIDE: 106 mmol/L (ref 101–111)
CO2: 23 mmol/L (ref 22–32)
CREATININE: 2.55 mg/dL — AB (ref 0.44–1.00)
Calcium: 8.4 mg/dL — ABNORMAL LOW (ref 8.9–10.3)
GFR calc non Af Amer: 17 mL/min — ABNORMAL LOW (ref >60)
GFR, EST AFRICAN AMERICAN: 20 mL/min — AB (ref >60)
Glucose, Bld: 98 mg/dL (ref 65–99)
Potassium: 3.8 mmol/L (ref 3.5–5.1)
SODIUM: 138 mmol/L (ref 135–145)

## 2017-05-17 LAB — OSMOLALITY: Osmolality: 292 mOsm/kg (ref 275–295)

## 2017-05-17 LAB — OSMOLALITY, URINE: Osmolality, Ur: 248 mOsm/kg — ABNORMAL LOW (ref 300–900)

## 2017-05-17 NOTE — Progress Notes (Signed)
PROGRESS NOTE                                                                                                                                                                                                             Patient Demographics:    Rhonda Hughes, is a 77 y.o. female, DOB - 03-07-1940, YBW:[id number]  Admit date - 05/16/2017   Admitting Physician Louellen Molder, MD  Outpatient Primary MD for the patient is System, Provider Not In  LOS - 1  Outpatient Specialists: None  Chief Complaint  Patient presents with  . Abdominal Pain       Brief Narrative 77 year old female with CAD status post LAD stenting in 2013, anxiety, Barrett's esophagus, depression presented to the ED with generalized weakness with lower abdominal discomfort for almost 2 weeks.  She was diagnosed and treated with UTI since then (first with Macrobid followed by Cipro which she was still taking).  Reported very poor p.o. intake.  In the ED she was found to have AKI with creatinine of 2.85.    Subjective:   Reports feeling better.  Lower abdominal discomfort has improved as well.  Improved appetite.   Assessment  & Plan :    Principal Problem:   AKI (acute kidney injury) ([place]) Likely prerenal ATN due to combination of dehydration with poor p.o. intake, recent UTI and concomitant use of lisinopril-HCTZ and ciprofloxacin. Low urine osmolality, normal urine sodium and serum osmolality. Renal ultrasound shows fullness of the renal pelvis bilaterally, no signs of hydronephrosis. Continue IV hydration with normal saline.  Renal function slightly improved this morning.  Recheck labs in a.m. and if no further improvement or worsening will need nephrology evaluation. Avoid all nephrotoxins.     Active Problems:   Barrett's esophagus   Coronary artery disease involving native coronary artery of native heart with unstable angina pectoris  ([place]) Cardiac cath in 06/2015 with double vessel CAD with patent stent in mid LAD and obtuse marginal and mild nonobstructive disease and several other vessels.  On medical management with Plavix, Imdur and statin.     Constipation Improved with adding bowel regimen.  GERD with history of Barrett's esophagus Continue PPI  Dyslipidemia Continue statin and niacin.  Recent UTI Patient has been on antibiotic for almost 2 weeks.  UA negative and no further dysuria.  Discontinued further  antibiotics.     Code Status : Full code  Family Communication  : Husband at bedside  Disposition Plan  : Home in 24-48 hours if renal function improved  Barriers For Discharge : Active symptoms  Consults  : None  Procedures  : Renal ultrasound  DVT Prophylaxis  : Subcu heparin  Lab Results  Component Value Date   PLT 303 05/16/2017    Antibiotics  :    Anti-infectives (From admission, onward)   None        Objective:   Vitals:   05/16/17 1548 05/16/17 1623 05/16/17 2143 05/17/17 0501  BP: (!) 141/59 137/68 (!) 107/46 (!) 108/53  Pulse: 71 73 73 65  Resp: 15 18 16 17   Temp:  99 F (37.2 C) 98.3 F (36.8 C) 97.8 F (36.6 C)  TempSrc:  Oral Oral Oral  SpO2: 96% 96% 96% 94%  Weight:  56 kg (123 lb 7.3 oz)    Height:  5\' 2"  (1.575 m)      Wt Readings from Last 3 Encounters:  05/16/17 56 kg (123 lb 7.3 oz)  05/12/17 56 kg (123 lb 6.4 oz)  08/19/16 57.4 kg (126 lb 9.6 oz)     Intake/Output Summary (Last 24 hours) at 05/17/2017 1004 Last data filed at 05/17/2017 0700 Gross per 24 hour  Intake 1750 ml  Output -  Net 1750 ml     Physical Exam  Gen: not in distress HEENT:moist mucosa, supple neck Chest: clear b/l, no added sounds CVS: N S1&S2, no murmurs,  GI: soft, NT, ND, BS+ Musculoskeletal: warm, no edema     Data Review:    CBC Recent Labs  Lab 05/16/17 0624  WBC 10.9*  HGB 11.3*  HCT 34.7*  PLT 303  MCV 95.1  MCH 31.0  MCHC 32.6  RDW 12.2   LYMPHSABS 1.0  MONOABS 0.7  EOSABS 0.1  BASOSABS 0.0    Chemistries  Recent Labs  Lab 05/16/17 0624 05/17/17 0609  NA 139 138  K 4.2 3.8  CL 104 106  CO2 23 23  GLUCOSE 128* 98  BUN 19 14  CREATININE 2.85* 2.55*  CALCIUM 9.8 8.4*   ------------------------------------------------------------------------------------------------------------------ No results for input(s): CHOL, HDL, LDLCALC, TRIG, CHOLHDL, LDLDIRECT in the last 72 hours.  No results found for: HGBA1C ------------------------------------------------------------------------------------------------------------------ No results for input(s): TSH, T4TOTAL, T3FREE, THYROIDAB in the last 72 hours.  Invalid input(s): FREET3 ------------------------------------------------------------------------------------------------------------------ No results for input(s): VITAMINB12, FOLATE, FERRITIN, TIBC, IRON, RETICCTPCT in the last 72 hours.  Coagulation profile No results for input(s): INR, PROTIME in the last 168 hours.  No results for input(s): DDIMER in the last 72 hours.  Cardiac Enzymes No results for input(s): CKMB, TROPONINI, MYOGLOBIN in the last 168 hours.  Invalid input(s): CK ------------------------------------------------------------------------------------------------------------------    Component Value Date/Time   BNP 6.0 04/24/2015 1740    Inpatient Medications  Scheduled Meds: . atorvastatin  10 mg Oral QHS  . clopidogrel  75 mg Oral Q breakfast  . heparin  5,000 Units Subcutaneous Q8H  . isosorbide mononitrate  30 mg Oral Daily  . memantine  5 mg Oral BID  . pantoprazole  40 mg Oral Daily  . senna-docusate  1 tablet Oral BID  . venlafaxine XR  37.5 mg Oral QHS   Continuous Infusions: . sodium chloride 100 mL/hr at 05/17/17 0240   PRN Meds:.acetaminophen **OR** acetaminophen, diphenhydrAMINE **AND** acetaminophen, ALPRAZolam, ondansetron **OR** ondansetron (ZOFRAN) IV, polyethylene  glycol, polyvinyl alcohol  Micro Results No results found  for this or any previous visit (from the past 240 hour(s)).  Radiology Reports US Renal  Result Date: 05/17/2017 CLINICAL DATA:  77 year old hypertensive female with acute renal failure. Initial encounter. EXAM: RENAL / URINARY TRACT ULTRASOUND COMPLETE COMPARISON:  None. FINDINGS: Right Kidney: Length: 11.9 cm. Echogenicity within normal limits. Fullness right renal pelvis. Lower pole 0.9 x 0.8 x 0.8 cm cyst. Left Kidney: Length: 11.8 cm. Echogenicity within normal limits. Fullness left renal pelvis. Upper pole 0.7 x 0.7 x 0.7 cm cyst. Bladder: Appears normal for degree of bladder distention. Bilateral ureteral jets noted. IMPRESSION: Fullness of the renal pelvis bilaterally. Subcentimeter renal cyst noted bilaterally. Electronically Signed   By: Genia Del M.D.   On: 05/17/2017 09:30    Time Spent in minutes  25   Davita Sublett M.D on 05/17/2017 at 10:04 AM  Between 7am to 7pm - Pager - [phone]  After 7pm go to [email or web address] - password [email or web address]  [facility] -  Office  [phone]

## 2017-05-17 NOTE — Care Management Important Message (Signed)
Important Message  Patient Details  Name: Rhonda Hughes MRN: [id number] Date of Birth: 04-09-1940   Medicare Important Message Given:  Yes    Shelda Altes 05/17/2017, 12:09 PM

## 2017-05-17 NOTE — Care Management Important Message (Signed)
Important Message  Patient Details  Name: CLOE SOCKWELL MRN: [id number] Date of Birth: 12-28-1940   Medicare Important Message Given:  Yes    Shelda Altes 05/17/2017, 12:08 PM

## 2017-05-18 LAB — BASIC METABOLIC PANEL
Anion gap: 10 (ref 5–15)
BUN: 14 mg/dL (ref 6–20)
CALCIUM: 7.9 mg/dL — AB (ref 8.9–10.3)
CHLORIDE: 105 mmol/L (ref 101–111)
CO2: 22 mmol/L (ref 22–32)
CREATININE: 2.54 mg/dL — AB (ref 0.44–1.00)
GFR calc non Af Amer: 18 mL/min — ABNORMAL LOW (ref >60)
GFR, EST AFRICAN AMERICAN: 20 mL/min — AB (ref >60)
GLUCOSE: 98 mg/dL (ref 65–99)
Potassium: 3.7 mmol/L (ref 3.5–5.1)
Sodium: 137 mmol/L (ref 135–145)

## 2017-05-18 NOTE — Progress Notes (Signed)
PROGRESS NOTE                                                                                                                                                                                                             Patient Demographics:    Rhonda Hughes, is a 77 y.o. female, DOB - 1941/01/19, WUX:[id number]  Admit date - 05/16/2017   Admitting Physician Fran Lowes, MD  Outpatient Primary MD for the patient is System, Provider Not In  LOS - 2  Outpatient Specialists: None  Chief Complaint  Patient presents with  . Abdominal Pain       Brief Narrative 77 year old female with CAD status post LAD stenting in 2013, anxiety, Barrett's esophagus, depression presented to the ED with generalized weakness with lower abdominal discomfort for almost 2 weeks.  She was diagnosed and treated with UTI since then (first with Macrobid followed by Cipro which she was still taking).  Reported very poor p.o. intake.  In the ED she was found to have AKI with creatinine of 2.85.    Subjective:   Continues to feel better but renal function still not improved from yesterday.   Assessment  & Plan :    Principal Problem:   AKI (acute kidney injury) ([place]) Likely prerenal ATN due to combination of dehydration with poor p.o. intake, recent UTI and concomitant use of lisinopril-HCTZ and ciprofloxacin (causing AIN). Low urine osmolality, normal urine sodium and serum osmolality.  Renal function minimally improved despite IV hydration so nephrology consulted.  Recommended ANA, complement, hep B surface antigen and hep C antibody, 24-hour urine for protein and creatinine clearance.  Recommended increasing fluids to 135 cc/h. Low urine osmolality, normal urine sodium and serum osmolality. Renal ultrasound shows fullness of the renal pelvis bilaterally, no signs of hydronephrosis.       Active Problems:    Coronary artery disease  involving native coronary artery of native heart with unstable angina pectoris ([place]) Cardiac cath in 06/2015 with double vessel CAD with patent stent in mid LAD and obtuse marginal and mild nonobstructive disease and several other vessels.  On medical management with Plavix, Imdur and statin.     Constipation Improved with bowel regimen.  GERD with history of Barrett's esophagus Continue PPI  Dyslipidemia Continue statin and niacin.  Recent UTI Patient has been on antibiotic for almost  2 weeks.  UA negative and no further dysuria.  Discontinued further antibiotics.     Code Status : Full code  Family Communication  : None at bedside  Disposition Plan  : Home in 24-48 hours if renal function improved  Barriers For Discharge : Active symptoms  Consults  : Nephrology  Procedures  : Renal ultrasound  DVT Prophylaxis  : Subcu heparin  Lab Results  Component Value Date   PLT 303 05/16/2017    Antibiotics  :    Anti-infectives (From admission, onward)   None        Objective:   Vitals:   05/17/17 0501 05/17/17 1419 05/17/17 2124 05/18/17 0515  BP: (!) 108/53 (!) 106/54 139/65 126/60  Pulse: 65 60 (!) 59 76  Resp: 17 18 20 16   Temp: 97.8 F (36.6 C) 98 F (36.7 C) 97.7 F (36.5 C) 98.4 F (36.9 C)  TempSrc: Oral Oral Oral Oral  SpO2: 94% 98% 95% 96%  Weight:      Height:        Wt Readings from Last 3 Encounters:  05/16/17 56 kg (123 lb 7.3 oz)  05/12/17 56 kg (123 lb 6.4 oz)  08/19/16 57.4 kg (126 lb 9.6 oz)     Intake/Output Summary (Last 24 hours) at 05/18/2017 1021 Last data filed at 05/18/2017 0514 Gross per 24 hour  Intake 480 ml  Output 1500 ml  Net -1020 ml    Physical exam Not in distress HEENT: Moist mucosa, supple neck Chest: Clear bilaterally CVS: Normal S1 and S2, no murmurs GI: Soft, nondistended, nontender Musculoskeletal: Warm, no edema     Data Review:    CBC Recent Labs  Lab 05/16/17 0624  WBC 10.9*  HGB 11.3*   HCT 34.7*  PLT 303  MCV 95.1  MCH 31.0  MCHC 32.6  RDW 12.2  LYMPHSABS 1.0  MONOABS 0.7  EOSABS 0.1  BASOSABS 0.0    Chemistries  Recent Labs  Lab 05/16/17 0624 05/17/17 0609 05/18/17 0410  NA 139 138 137  K 4.2 3.8 3.7  CL 104 106 105  CO2 23 23 22   GLUCOSE 128* 98 98  BUN 19 14 14   CREATININE 2.85* 2.55* 2.54*  CALCIUM 9.8 8.4* 7.9*   ------------------------------------------------------------------------------------------------------------------ No results for input(s): CHOL, HDL, LDLCALC, TRIG, CHOLHDL, LDLDIRECT in the last 72 hours.  No results found for: HGBA1C ------------------------------------------------------------------------------------------------------------------ No results for input(s): TSH, T4TOTAL, T3FREE, THYROIDAB in the last 72 hours.  Invalid input(s): FREET3 ------------------------------------------------------------------------------------------------------------------ No results for input(s): VITAMINB12, FOLATE, FERRITIN, TIBC, IRON, RETICCTPCT in the last 72 hours.  Coagulation profile No results for input(s): INR, PROTIME in the last 168 hours.  No results for input(s): DDIMER in the last 72 hours.  Cardiac Enzymes No results for input(s): CKMB, TROPONINI, MYOGLOBIN in the last 168 hours.  Invalid input(s): CK ------------------------------------------------------------------------------------------------------------------    Component Value Date/Time   BNP 6.0 04/24/2015 1740    Inpatient Medications  Scheduled Meds: . atorvastatin  10 mg Oral QHS  . clopidogrel  75 mg Oral Q breakfast  . heparin  5,000 Units Subcutaneous Q8H  . isosorbide mononitrate  30 mg Oral Daily  . memantine  5 mg Oral BID  . pantoprazole  40 mg Oral Daily  . venlafaxine XR  37.5 mg Oral QHS   Continuous Infusions: . sodium chloride 100 mL/hr at 05/17/17 2128   PRN Meds:.acetaminophen **OR** acetaminophen, diphenhydrAMINE **AND**  acetaminophen, ALPRAZolam, ondansetron **OR** ondansetron (ZOFRAN) IV, polyethylene glycol, polyvinyl alcohol  Micro Results Recent Results (from the past 240 hour(s))  Urine Culture     Status: None   Collection Time: 05/16/17  5:59 AM  Result Value Ref Range Status   Specimen Description   Final    URINE, CLEAN CATCH Performed at [facility], [address], [place], [place] [address]    Special Requests   Final    NONE Performed at [facility], [address], [place], [place] [address]    Culture   Final    NO GROWTH Performed at [facility], [place] [address] [place], [place] [address]    Report Status 05/17/2017 FINAL  Final    Radiology Reports US Renal  Result Date: 05/17/2017 CLINICAL DATA:  77 year old hypertensive female with acute renal failure. Initial encounter. EXAM: RENAL / URINARY TRACT ULTRASOUND COMPLETE COMPARISON:  None. FINDINGS: Right Kidney: Length: 11.9 cm. Echogenicity within normal limits. Fullness right renal pelvis. Lower pole 0.9 x 0.8 x 0.8 cm cyst. Left Kidney: Length: 11.8 cm. Echogenicity within normal limits. Fullness left renal pelvis. Upper pole 0.7 x 0.7 x 0.7 cm cyst. Bladder: Appears normal for degree of bladder distention. Bilateral ureteral jets noted. IMPRESSION: Fullness of the renal pelvis bilaterally. Subcentimeter renal cyst noted bilaterally. Electronically Signed   By: Genia Del M.D.   On: 05/17/2017 09:30    Time Spent in minutes  25   Lezley Bedgood M.D on 05/18/2017 at 10:21 AM  Between 7am to 7pm - Pager - [phone]  After 7pm go to [email or web address] - password [facility]  [facility] -  Office  [phone]

## 2017-05-18 NOTE — Consult Note (Signed)
Reason for Consult: Renal failure Referring Physician: Dr. Sallye Lat Rhonda Hughes is an 77 y.o. female.  HPI: She is a patient who has history of hypertension, coronary artery disease, depression presently came with complaints of abdominal pain, some dysuria.  When she was evaluated she was found to have elevated creatinine hence admitted to the hospital.  Presently patient is feeling much better.  She states that she was having abdominal pain and dysuria for the last several weeks.  Initially she was started on Microbid  and swiched  to Cipro last week since the [place]  Was not working.  Patient denies any previous history of renal failure, urine tract infection or kidney stone.  Presently she states that she is feeling much better.  Her abdominal pain is gone.  Patient denies any nausea, vomiting or diarrhea.  Past Medical History:  Diagnosis Date  . AKI (acute kidney injury) ([place]) 05/16/2017  . Anxiety   . Barrett'Hughes esophagus without dysplasia   . CAD (coronary artery disease)    a. LHC 10/16/11: dLM 20%, mLAD 90%, pOM1 30%, mOM1 70% (FFR not hemodynamically significant), prox and mid RCA 30%, EF 65%;  b. PCI 10/16/11: Promus DES x 2 to mLAD  . COPD (chronic obstructive pulmonary disease) ([place])   . Depression   . Diverticulosis   . Essential hypertension   . GERD (gastroesophageal reflux disease)   . Hyperlipidemia   . Rectocele     Past Surgical History:  Procedure Laterality Date  . ABDOMINAL HYSTERECTOMY    . CARDIAC CATHETERIZATION    . CARDIAC CATHETERIZATION N/A 06/21/2015   Procedure: Right/Left Heart Cath and Coronary Angiography;  Surgeon: [name], MD;  Location: [facility];  Service: Cardiovascular;  Laterality: N/A;  . COLONOSCOPY  01/2008   Multiple diverticula in desc and sigm colon  . ESOPHAGOGASTRODUODENOSCOPY  01/2008   Barrett'Hughes esophagus, gastritis, no h.pylori, due follow-up 01/2011  . ESOPHAGOGASTRODUODENOSCOPY  04/06/2011   Procedure:  ESOPHAGOGASTRODUODENOSCOPY (EGD);  Surgeon: Danie Binder, MD;  [facility];  Service: Endoscopy;  Laterality: N/A;  8:30  . FLEXIBLE SIGMOIDOSCOPY  08/11/2010 RECTAL PAIN/PRESSURE   SML IH, TICS  . FRACTIONAL FLOW RESERVE WIRE N/A 10/16/2011   Procedure: FRACTIONAL FLOW RESERVE WIRE;  Surgeon: [name], MD;  Location: [facility] CATH LAB;  Service: Cardiovascular;  Laterality: N/A;  . LEFT HEART CATHETERIZATION WITH CORONARY ANGIOGRAM N/A 03/07/2012   Procedure: LEFT HEART CATHETERIZATION WITH CORONARY ANGIOGRAM;  Surgeon: [name], MD;  Location: [facility] CATH LAB;  Service: Cardiovascular;  Laterality: N/A;  . LEFT HEART CATHETERIZATION WITH CORONARY ANGIOGRAM N/A 12/14/2012   Procedure: LEFT HEART CATHETERIZATION WITH CORONARY ANGIOGRAM;  Surgeon: [name], MD;  Location: [facility] CATH LAB;  Service: Cardiovascular;  Laterality: N/A;  . LEFT HEART CATHETERIZATION WITH CORONARY ANGIOGRAM N/A 05/07/2014   Procedure: LEFT HEART CATHETERIZATION WITH CORONARY ANGIOGRAM;  Surgeon: [name], MD;  Location: [facility] CATH LAB;  Service: Cardiovascular;  Laterality: N/A;  . PARTIAL HYSTERECTOMY  1980s    Family History  Problem Relation Age of Onset  . Coronary artery disease Father 54  . Heart attack Father 67  . GI problems Mother        Perforated colon   . Coronary artery disease Paternal Aunt   . Coronary artery disease Paternal Uncle   . Colon cancer Neg Hx   . Gastric cancer Neg Hx   . Esophageal cancer Neg Hx     Social  History:  reports that she quit smoking about 10 years ago. Her smoking use included cigarettes. She has a 53.00 pack-year smoking history. She has never used smokeless tobacco. She reports that she does not drink alcohol or use drugs.  Allergies:  Allergies  Allergen Reactions  . Adhesive [Tape] Other (See Comments)    Turns skin red  . Sulfa Antibiotics Other (See Comments)    Childhood allergy    Medications: I have  reviewed the patient'Hughes current medications.  Results for orders placed or performed during the hospital encounter of 05/16/17 (from the past 48 hour(Hughes))  Sodium, urine, random     Status: None   Collection Time: 05/16/17 11:30 AM  Result Value Ref Range   Sodium, Ur 75 mmol/L    Comment: Performed at [facility], [address], [place], [place] [address]  Creatinine, urine, random     Status: None   Collection Time: 05/16/17 11:30 AM  Result Value Ref Range   Creatinine, Urine 39.66 mg/dL    Comment: Performed at [facility], [address], [place], [place] [address]  Osmolality, urine     Status: Abnormal   Collection Time: 05/16/17 11:30 AM  Result Value Ref Range   Osmolality, Ur 248 (L) 300 - 900 mOsm/kg    Comment: Performed at [facility] [address] [place], [place] [address]  Basic metabolic panel     Status: Abnormal   Collection Time: 05/17/17  6:09 AM  Result Value Ref Range   Sodium 138 135 - 145 mmol/L   Potassium 3.8 3.5 - 5.1 mmol/L   Chloride 106 101 - 111 mmol/L   CO2 23 22 - 32 mmol/L   Glucose, Bld 98 65 - 99 mg/dL   BUN 14 6 - 20 mg/dL   Creatinine, Ser 2.55 (H) 0.44 - 1.00 mg/dL   Calcium 8.4 (L) 8.9 - 10.3 mg/dL   GFR calc non Af Amer 17 (L) >60 mL/min   GFR calc Af Amer 20 (L) >60 mL/min    Comment: (NOTE) The eGFR has been calculated using the CKD EPI equation. This calculation has not been validated in all clinical situations. eGFR'Hughes persistently <60 mL/min signify possible Chronic Kidney Disease.    Anion gap 9 5 - 15    Comment: Performed at [facility], [address], [place], [place] [address]  Basic metabolic panel     Status: Abnormal   Collection Time: 05/18/17  4:10 AM  Result Value Ref Range   Sodium 137 135 - 145 mmol/L   Potassium 3.7 3.5 - 5.1 mmol/L   Chloride 105 101 - 111 mmol/L   CO2 22 22 - 32 mmol/L   Glucose, Bld 98 65 - 99 mg/dL   BUN 14 6 - 20 mg/dL   Creatinine, Ser 2.54 (H) 0.44 - 1.00 mg/dL   Calcium 7.9  (L) 8.9 - 10.3 mg/dL   GFR calc non Af Amer 18 (L) >60 mL/min   GFR calc Af Amer 20 (L) >60 mL/min    Comment: (NOTE) The eGFR has been calculated using the CKD EPI equation. This calculation has not been validated in all clinical situations. eGFR'Hughes persistently <60 mL/min signify possible Chronic Kidney Disease.    Anion gap 10 5 - 15    Comment: Performed at [facility], [address] [place], [place] [address]    US Renal  Result Date: 05/17/2017 CLINICAL DATA:  77 year old hypertensive female with acute renal failure. Initial encounter. EXAM: RENAL /  URINARY TRACT ULTRASOUND COMPLETE COMPARISON:  None. FINDINGS: Right Kidney: Length: 11.9 cm. Echogenicity within normal limits. Fullness right renal pelvis. Lower pole 0.9 x 0.8 x 0.8 cm cyst. Left Kidney: Length: 11.8 cm. Echogenicity within normal limits. Fullness left renal pelvis. Upper pole 0.7 x 0.7 x 0.7 cm cyst. Bladder: Appears normal for degree of bladder distention. Bilateral ureteral jets noted. IMPRESSION: Fullness of the renal pelvis bilaterally. Subcentimeter renal cyst noted bilaterally. Electronically Signed   By: Genia Del M.D.   On: 05/17/2017 09:30    Review of Systems  Constitutional: Negative for chills and fever.  Respiratory: Negative for cough and shortness of breath.   Cardiovascular: Negative for orthopnea and leg swelling.  Gastrointestinal: Positive for abdominal pain. Negative for nausea and vomiting.  Genitourinary: Positive for dysuria. Negative for urgency.  Psychiatric/Behavioral: Positive for depression.   Blood pressure 126/60, pulse 76, temperature 98.4 F (36.9 C), temperature source Oral, resp. rate 16, height 5' 2"  (1.575 m), weight 56 kg (123 lb 7.3 oz), SpO2 96 %. Physical Exam  Constitutional: She is oriented to person, place, and time. No distress.  Eyes: No scleral icterus.  Neck: No JVD present.  Cardiovascular: Normal rate and regular rhythm.  No murmur heard. Respiratory: No  respiratory distress. She has no wheezes. She has no rales.  GI: She exhibits no distension. There is no tenderness.  Musculoskeletal: She exhibits no edema.  Neurological: She is alert and oriented to person, place, and time.    Assessment/Plan: 1] renal failure: Acute versus chronic.  Last blood work which was available here was from 2017 with creatinine of 0.95.  Since then there is no blood work at least here in the hospital.  She states that she is being followed at [facility] by her primary care physician but no history of renal failure.  At this moment no sure whether this is acute or chronic.  Possible etiologies include  AIN from antibiotics such as Cipro, prerenal syndrome/ACE inhibitor i.e. lisinopril.  Her renal function at this moment is stable.  At this moment other etiologies cannot be ruled out.  Ultrasound of the kidneys showed right kidney to be 11.9 and left kidney 11.8. 2] possible urinary tract infection: Presently she is asymptomatic 3] hypertension: Her blood pressure is reasonably controlled 4] anemia: Possibly iron deficiency anemia 5] coronary artery disease: Status post a stent placement.  Patient at this moment does not have any chest pain 6] history of depression. Plan: We will check ANA, complement, hepatitis B surface antigen, hepatitis C antibody 2] we will do 24-hour urine for protein and creatinine clearance. 3] we will check a renal panel in the morning 4] we will increase IV fluid to 135 cc/h  Rhonda Hughes 05/18/2017, 9:49 AM

## 2017-05-19 DIAGNOSIS — K2270 Barrett's esophagus without dysplasia: Secondary | ICD-10-CM

## 2017-05-19 DIAGNOSIS — R638 Other symptoms and signs concerning food and fluid intake: Secondary | ICD-10-CM

## 2017-05-19 DIAGNOSIS — E876 Hypokalemia: Secondary | ICD-10-CM

## 2017-05-19 DIAGNOSIS — N179 Acute kidney failure, unspecified: Secondary | ICD-10-CM

## 2017-05-19 DIAGNOSIS — I10 Essential (primary) hypertension: Secondary | ICD-10-CM

## 2017-05-19 DIAGNOSIS — I25110 Atherosclerotic heart disease of native coronary artery with unstable angina pectoris: Secondary | ICD-10-CM

## 2017-05-19 DIAGNOSIS — K5909 Other constipation: Secondary | ICD-10-CM

## 2017-05-19 LAB — RENAL FUNCTION PANEL
Albumin: 3 g/dL — ABNORMAL LOW (ref 3.5–5.0)
Anion gap: 10 (ref 5–15)
BUN: 12 mg/dL (ref 6–20)
CHLORIDE: 109 mmol/L (ref 101–111)
CO2: 23 mmol/L (ref 22–32)
Calcium: 7.8 mg/dL — ABNORMAL LOW (ref 8.9–10.3)
Creatinine, Ser: 2.43 mg/dL — ABNORMAL HIGH (ref 0.44–1.00)
GFR calc Af Amer: 21 mL/min — ABNORMAL LOW (ref >60)
GFR calc non Af Amer: 18 mL/min — ABNORMAL LOW (ref >60)
GLUCOSE: 96 mg/dL (ref 65–99)
POTASSIUM: 3.4 mmol/L — AB (ref 3.5–5.1)
Phosphorus: 4.5 mg/dL (ref 2.5–4.6)
Sodium: 142 mmol/L (ref 135–145)

## 2017-05-19 LAB — HEPATITIS C ANTIBODY: HCV Ab: <0.1 s/co ratio (ref 0.0–0.9)

## 2017-05-19 LAB — HEPATITIS B SURFACE ANTIGEN: Hepatitis B Surface Ag: NEGATIVE

## 2017-05-19 LAB — C3 COMPLEMENT: C3 COMPLEMENT: 130 mg/dL (ref 82–167)

## 2017-05-19 LAB — ANTINUCLEAR ANTIBODIES, IFA: ANTINUCLEAR ANTIBODIES, IFA: NEGATIVE

## 2017-05-19 LAB — C4 COMPLEMENT: COMPLEMENT C4, BODY FLUID: 20 mg/dL (ref 14–44)

## 2017-05-19 LAB — RPR: RPR Ser Ql: NONREACTIVE

## 2017-05-19 LAB — MPO/PR-3 (ANCA) ANTIBODIES: Myeloperoxidase Abs: <9 U/mL (ref 0.0–9.0)

## 2017-05-19 MED ORDER — POTASSIUM CHLORIDE CRYS ER 20 MEQ PO TBCR: 20.0000 meq | EXTENDED_RELEASE_TABLET | Freq: Every day | ORAL | Status: DC

## 2017-05-19 MED ORDER — LISINOPRIL-HYDROCHLOROTHIAZIDE 20-12.5 MG PO TABS: 1.0000 | ORAL_TABLET | Freq: Every day | ORAL | Status: DC

## 2017-05-19 NOTE — Progress Notes (Signed)
Discharge instructions read to patient and her family.  Both verbalized understanding of all instructions. Discharge to home with spouse.

## 2017-05-19 NOTE — Discharge Summary (Signed)
Physician Discharge Summary  Rhonda Hughes PZW:[id number] DOB: 1940/01/28 DOA: 05/16/2017  PCP: System, Provider Not In  Admit date: 05/16/2017 Discharge date: 05/19/2017  Time spent: 35 minutes  Recommendations for Outpatient Follow-up:  Repeat renal function panel to follow electrolytes and renal function  Patient needs follow up with nephrology as an outpatient (as instructed) Reassess BP and adjust/re-start antihypertensive regimen as needed.  Discharge Diagnoses:    AKI (acute kidney injury) ([place]); with presume chronic renal failure stage 2 at baseline.   Barrett's esophagus   Coronary artery disease involving native coronary artery of native heart with unstable angina pectoris ([place])   Constipation   Dehydration symptoms   Acute kidney injury [facility] [address])   Discharge Condition: stable and improved. No nausea, vomiting or diarrhea. Patient instructed to keep herself well hydrated and to follow up with PCP in 10 days. She also had follow up appointment with renal service in 3 weeks.  Diet recommendation: Heart healthy/low-sodium diet.  Filed Weights   05/16/17 0531 05/16/17 1623  Weight: 55.8 kg (123 lb) 56 kg (123 lb 7.3 oz)    History of present illness:  As per H&P written by Dr. Clementeen Graham from 05/16/2017. 77 y.o. female, with history of coronary artery disease status post LAD stenting in 2013 (last cath in 2017 with patent stents and mild CAD), anxiety, Barrett's esophagus, depression, who presented to the ED with generalized weakness and lower abdominal discomfort.  Symptoms have been ongoing for almost 2 weeks worsened for the past 1 week.  2 weeks ago she was having some abdominal discomfort and dysuria.  She was placed on Macrobid by her PCP which did not improve her symptoms and after a few days she was switched to ciprofloxacin which she is still taking.  Patient reports very poor p.o. intake for the same duration.  She denies any fever but yesterday had some chills.  She feels weak  overall and has not been active as usual.  She has been urinating very little for past week. Denies any headache, blurred vision, dizziness, chest pain, nausea, vomiting, further dysuria, denies diarrhea and feels constipated.  Denies any tingling or numbness of her extremities.  Denies use of NSAIDs or any new medications.  Course in the ED Vitals were stable.  Blood work showed WC of 10 point 9K, hemoglobin of 11.3.  Chemistry showed AKI with creatinine of 2.85 (previously normal renal function.) UA was negative for infection.  Patient was given 1 L normal saline bolus and hospitalist consulted for admission for AKI.   Hospital Course:  1-AKI on presumed CRF (stage 2) -most likely pre-renal in the setting of dehydration and ATN with recent exposure to abx's for UTI and continue use of lisinopril/HCTZ. -patient renal function improved significantly with IVF's -Renal ultrasound shows fullness of the renal pelvis bilaterally, no signs of hydronephrosis. -renal service recommended hepatitis B serologies, ANA, complement, 24-hour urine for protein and creatinine clearance. -patient able to keep herself hydrated and with stable VS -nephrology will follow up results and make further rec's as an outpatient -patient discharge with instructions to keep herself well hydrated and to stop nephrotoxic antihypertensive agents until follow up with PCP.  2-coronary abdomen disease Patient denies chest pain,-shortness of breath no acute abnormalities appreciated on telemetry -Most recent cath in June 2017 with double vessel coronary artery disease and patent stent mid LAD. -Continue Plavix, indoor and statins. -Patient advised to follow heart healthy diet.  3-hypertension -Stable and well controlled. -Patient Antihypertensive Regimen (HCTZ and  lisinopril has been kept on hold at discharge due to chronic renal function. -Will recommend reassessment of the blood pressure and further adjustment into her  meds as needed   4-GERD -continue PPI  5-recent UTI -treated -not need for further antibiotics currently -patient denies dysuria   6-constipation -continue current bowel regimen -patient moving bowels properly. -advise to increase fiber in her diet.  Procedures:  See below for x-ray reports   Consultations:  Nephrology   Discharge Exam: Vitals:   05/19/17 0534 05/19/17 1337  BP: (!) 145/69 (!) 135/58  Pulse: 67 (!) 58  Resp:  18  Temp: 98.4 F (36.9 C) 98.2 F (36.8 C)  SpO2: 95% 96%   General: in no distress. No CP, no SOB, no nausea, no vomiting.  HEENT: Moist mucous membranes, supple neck, no JVD, no erythema findings, no thrush. Chest: Clear to auscultation bilaterally, more using accessory muscles; good oxygen saturation.   CVS: S1 and S2, no murmurs, no gallops, no rubs. GI: Soft, nontender, nondistended, positive bowel sounds.   Musculoskeletal: No edema, no cyanosis, no clubbing.     Discharge Instructions   Discharge Instructions    Diet - low sodium heart healthy   Complete by:  As directed    Discharge instructions   Complete by:  As directed    Keep yourself well-hydrated (at least half week to 18 ounces of water on a daily basis). Arrange follow-up with PCP in 10 days Follow-up with nephrologist (Dr. Lowanda Foster) in 3 weeks Hold lisinopril and HCTZ until follow up with PCP.     Allergies as of 05/19/2017      Reactions   Adhesive [tape] Other (See Comments)   Turns skin red   Sulfa Antibiotics Other (See Comments)   Childhood allergy      Medication List    STOP taking these medications   ciprofloxacin 500 MG tablet Commonly known as:  CIPRO     TAKE these medications   ALPRAZolam 0.25 MG tablet Commonly known as:  XANAX Take 0.25 mg by mouth daily as needed. For anxiety   atorvastatin 10 MG tablet Commonly known as:  LIPITOR Take 10 mg by mouth at bedtime.   clopidogrel 75 MG tablet Commonly known as:  PLAVIX TAKE 1 TABLET BY  MOUTH  DAILY WITH BREAKFAST   diphenhydramine-acetaminophen 25-500 MG Tabs tablet Commonly known as:  TYLENOL PM Take 2 tablets by mouth at bedtime as needed (Sleep).   docusate sodium 100 MG capsule Commonly known as:  COLACE Take 100-200 mg by mouth as needed.   isosorbide mononitrate 30 MG 24 hr tablet Commonly known as:  IMDUR TAKE 1 TABLET BY MOUTH  DAILY   lisinopril-hydrochlorothiazide 20-12.5 MG tablet Commonly known as:  PRINZIDE,ZESTORETIC Take 1 tablet by mouth daily. Hold medication until follow-up with PCP What changed:  additional instructions   memantine 10 MG tablet Commonly known as:  NAMENDA Take 5 mg by mouth 2 (two) times daily.   pantoprazole 20 MG tablet Commonly known as:  PROTONIX Take 20 mg by mouth daily.   potassium chloride SA 20 MEQ tablet Commonly known as:  K-DUR,KLOR-CON Take 1 tablet (20 mEq total) by mouth daily. What changed:  how much to take   REFRESH OP Apply 2 drops to eye daily as needed (Scratchy eyes).   venlafaxine XR 37.5 MG 24 hr capsule Commonly known as:  EFFEXOR-XR Take 37.5 mg by mouth at bedtime.      Allergies  Allergen Reactions  . Adhesive [  Tape] Other (See Comments)    Turns skin red  . Sulfa Antibiotics Other (See Comments)    Childhood allergy   Follow-up Information    Fran Lowes, MD. Schedule an appointment as soon as possible for a visit in 3 week(s).   Specialty:  Nephrology Contact information: [address] [place] [address] [phone]            The results of significant diagnostics from this hospitalization (including imaging, microbiology, ancillary and laboratory) are listed below for reference.    Significant Diagnostic Studies: US Renal  Result Date: 05/17/2017 CLINICAL DATA:  77 year old hypertensive female with acute renal failure. Initial encounter. EXAM: RENAL / URINARY TRACT ULTRASOUND COMPLETE COMPARISON:  None. FINDINGS: Right Kidney: Length: 11.9 cm.  Echogenicity within normal limits. Fullness right renal pelvis. Lower pole 0.9 x 0.8 x 0.8 cm cyst. Left Kidney: Length: 11.8 cm. Echogenicity within normal limits. Fullness left renal pelvis. Upper pole 0.7 x 0.7 x 0.7 cm cyst. Bladder: Appears normal for degree of bladder distention. Bilateral ureteral jets noted. IMPRESSION: Fullness of the renal pelvis bilaterally. Subcentimeter renal cyst noted bilaterally. Electronically Signed   By: Genia Del M.D.   On: 05/17/2017 09:30    Microbiology: Recent Results (from the past 240 hour(s))  Urine Culture     Status: None   Collection Time: 05/16/17  5:59 AM  Result Value Ref Range Status   Specimen Description   Final    URINE, CLEAN CATCH Performed at [facility], [address], [place], [place] [address]    Special Requests   Final    NONE Performed at [facility], [address], [place], [place] [address]    Culture   Final    NO GROWTH Performed at [facility], [place] [address], [place], [place] [address]    Report Status 05/17/2017 FINAL  Final     Labs: Basic Metabolic Panel: Recent Labs  Lab 05/16/17 0624 05/17/17 0609 05/18/17 0410 05/19/17 0619  NA 139 138 137 142  K 4.2 3.8 3.7 3.4*  CL 104 106 105 109  CO2 23 23 22 23   GLUCOSE 128* 98 98 96  BUN 19 14 14 12   CREATININE 2.85* 2.55* 2.54* 2.43*  CALCIUM 9.8 8.4* 7.9* 7.8*  PHOS  --   --   --  4.5   Liver Function Tests: Recent Labs  Lab 05/19/17 0619  ALBUMIN 3.0*   No results for input(s): LIPASE, AMYLASE in the last 168 hours. No results for input(s): AMMONIA in the last 168 hours. CBC: Recent Labs  Lab 05/16/17 0624  WBC 10.9*  NEUTROABS 9.1*  HGB 11.3*  HCT 34.7*  MCV 95.1  PLT 303    Signed:  Barton Dubois MD.  [facility] 05/19/2017, 3:35 PM

## 2017-05-19 NOTE — Progress Notes (Signed)
Rhonda Hughes  MRN: [id number]  DOB/AGE: Nov 09, 1940 77 y.o.  Primary Care Physician:System, Provider Not In  Admit date: 05/16/2017  Chief Complaint:  Chief Complaint  Patient presents with  . Abdominal Pain    S-Pt presented on  05/16/2017 with  Chief Complaint  Patient presents with  . Abdominal Pain  .    Pt today feels better  Meds . atorvastatin  10 mg Oral QHS  . clopidogrel  75 mg Oral Q breakfast  . heparin  5,000 Units Subcutaneous Q8H  . isosorbide mononitrate  30 mg Oral Daily  . memantine  5 mg Oral BID  . pantoprazole  40 mg Oral Daily  . venlafaxine XR  37.5 mg Oral QHS     Physical Exam: Vital signs in last 24 hours: Temp:  [97.9 F (36.6 C)-99 F (37.2 C)] 98.4 F (36.9 C) (05/01 0534) Pulse Rate:  [67-75] 67 (05/01 0534) Resp:  [18-20] 20 (04/30 2021) BP: (116-171)/(55-72) 145/69 (05/01 0534) SpO2:  [91 %-97 %] 95 % (05/01 0534) Weight change:  Last BM Date: 05/17/17  Intake/Output from previous day: 04/30 0701 - 05/01 0700 In: 6298.7 [P.O.:720; I.V.:5578.7] Out: 1000 [Urine:1000] No intake/output data recorded.   Physical Exam: General- pt is awake,alert, oriented to time place and person Resp- No acute REsp distress, CTA B/L NO [place] CVS- S1S2 regular ij rate and rhythm GIT- BS+, soft, NT, ND EXT- NO LE Edema, Cyanosis   Lab Results:  HGB 11.3  BMET Recent Labs    05/18/17 0410 05/19/17 0619  NA 137 142  K 3.7 3.4*  CL 105 109  CO2 22 23  GLUCOSE 98 96  BUN 14 12  CREATININE 2.54* 2.43*  CALCIUM 7.9* 7.8*   Creat trend 2019 2.85=>2.54=>2.43   MICRO Recent Results (from the past 240 hour(s))  Urine Culture     Status: None   Collection Time: 05/16/17  5:59 AM  Result Value Ref Range Status   Specimen Description   Final    URINE, CLEAN CATCH Performed at [facility], [address] [place], [place] [address]    Special Requests   Final    NONE Performed at [facility], [address] [place],  [place] [address]    Culture   Final    NO GROWTH Performed at [facility], [place] [address], [place], [place] [address]    Report Status 05/17/2017 FINAL  Final      Lab Results  Component Value Date   CALCIUM 7.8 (L) 05/19/2017   PHOS 4.5 05/19/2017   Alb  3.0 Corrected  8.6      Impression: 1)Renal  AKI secondary to                      Prerenal/ATN-Poor po intake + HCTZ + ACE                     AIN-on cipro                     Post renal-renal u/s shows fullness+ Hx of UTI                AKI on CKD?                  CKD stage ? As not much data available.               AKI now better   2)HTN  Medication- On Vasodilators  3)Anemia HGb stable  4)CKD Mineral-Bone Disorder Calcium low -will check albumin  5)CAD- hx of CAD Primary MD following  6)Electrolytes  Hypokalemic  NOrmonatremic   7)Acid base Co2 at goal     Plan:  Will replete KCL will continue current care      [place] S 05/19/2017, 11:19 AM

## 2017-05-20 LAB — COMPLEMENT, TOTAL: Compl, Total (CH50): >60 U/mL (ref >41)

## 2017-06-29 ENCOUNTER — Telehealth: Payer: Self-pay | Admitting: *Deleted

## 2017-06-29 NOTE — Telephone Encounter (Signed)
   [facility] Pre-operative Risk Assessment    Request for surgical clearance:  1. What type of surgery is being performed? Transforaminal Epidural Steroid Injrction   2. When is this surgery scheduled? Not determined  3. What type of clearance is required (medical clearance vs. Pharmacy clearance to hold med vs. Both)? Pharmacy clearance to hold meds.  4. Are there any medications that need to be held prior to surgery and how long?Stop Plavix 75 mg 7 days prior to procedure.  5. Practice name and name of physician performing surgery? Dr.Joyce Leotis Pain, MD. Camila Li, MD. Need to speak with Lattie Corns CPHT Surgical coordinator at [facility]   6. What is your office phone number [phone]   7.   What is your office fax number [phone]  8.   Anesthesia type (None, local, MAC, general) ? Not specify.   Carollee Sires 06/29/2017, 4:06 PM  _________________________________________________________________   (provider comments below)

## 2017-06-30 NOTE — Telephone Encounter (Signed)
   Primary Cardiologist: Lauree Chandler, MD  Chart reviewed as part of pre-operative protocol coverage. She has known coronary disease and history of stents and is on Plavix. Phone call placed to discuss symptoms prior to granting clearance. LVM to call back. Will also route to Dr. Angelena Form to see if ok to hold Plavix for injection.    Lyda Jester, PA-C 06/30/2017, 3:16 PM

## 2017-07-01 NOTE — Telephone Encounter (Signed)
Spoke with husband. The patient will call back during pre-op hours.

## 2017-07-02 NOTE — Telephone Encounter (Signed)
OK to hold PLavix for procedure.   Rhonda Hughes

## 2017-07-05 ENCOUNTER — Telehealth: Payer: Self-pay | Admitting: Cardiovascular Disease

## 2017-07-05 NOTE — Telephone Encounter (Signed)
New Message:      Pt is calling to check on the status of her clearance.

## 2017-07-05 NOTE — Telephone Encounter (Signed)
lvm for Rhonda Hughes at [facility] doctors office to check if fax was received. Phone number left if did not receive fax.

## 2017-07-05 NOTE — Telephone Encounter (Signed)
   Primary Cardiologist: Lauree Chandler, MD  Chart reviewed as part of pre-operative protocol coverage. Given past medical history and time since last visit, based on ACC/AHA guidelines, Rhonda Hughes would be at acceptable risk for the planned procedure without further cardiovascular testing.   Last seen by Dr. Angelena Form 08/2016.  She has done well in the interim without chest pain or SOB and able to meet 4 mets with activity. Cleared to hold plavix for procedure by Dr. Angelena Form and resume when Dr. Teresa Pelton feels save to resume. .    I will route this recommendation to the requesting party via Epic fax function and remove from pre-op pool.  Please call with questions.  Cecilie Kicks, NP 07/05/2017, 2:28 PM

## 2017-07-15 ENCOUNTER — Ambulatory Visit: Payer: Medicare Other | Admitting: Gastroenterology

## 2017-07-15 ENCOUNTER — Ambulatory Visit: Payer: Medicare Other | Admitting: Cardiovascular Disease

## 2017-07-29 ENCOUNTER — Ambulatory Visit: Payer: Medicare Other | Admitting: Nurse Practitioner

## 2017-09-02 ENCOUNTER — Other Ambulatory Visit: Payer: Self-pay | Admitting: *Deleted

## 2017-09-02 DIAGNOSIS — I25110 Atherosclerotic heart disease of native coronary artery with unstable angina pectoris: Secondary | ICD-10-CM

## 2017-09-02 MED ORDER — CLOPIDOGREL BISULFATE 75 MG PO TABS: 75.0000 mg | ORAL_TABLET | Freq: Every day | ORAL | 0 refills | Status: DC

## 2017-09-13 ENCOUNTER — Ambulatory Visit (INDEPENDENT_AMBULATORY_CARE_PROVIDER_SITE_OTHER): Payer: Medicare Other | Admitting: Cardiovascular Disease

## 2017-09-13 VITALS — BP 138/78 | HR 67 | Ht 62.0 in | Wt 124.0 lb

## 2017-09-13 DIAGNOSIS — I25110 Atherosclerotic heart disease of native coronary artery with unstable angina pectoris: Secondary | ICD-10-CM | POA: Diagnosis not present

## 2017-09-13 DIAGNOSIS — I10 Essential (primary) hypertension: Secondary | ICD-10-CM

## 2017-09-13 NOTE — H&P (View-Only) (Signed)
Chief Complaint  Patient presents with  . Chest Pain    ; THIS HAS BEEN GOING ON FOR 2-3 MONTHS   . Shortness of Breath    SOMETIMES WITH EXERTION AND OTHER TIMES W/O EXERTION  . Fatigue   History of Present Illness: 77 yo female with history of CAD, HTN, HLD, anxiety, Barrett's esophagus who is here today for cardiac follow up. Cardiac cath in 2013 showed a moderate 70% stenosis in the first obtuse marginal branch and a 90% stenosis in the mid LAD. FFR of the obtuse marginal branch was negative. Overlapping drug eluting stents were placed in the mid LAD.  Repeat cath February 2014 in setting of dyspnea and a drug eluting stent was placed in the OM branch. I saw her on 11/30/12 and she had c/o fatigue and SOB. Stress myoview without ischemia on images but she did have EKG changes suggestive of ischemia during exercise. Cardiac cath 12/14/12 with patent stents, moderate disease. IVUS of left main and proximal LAD confirmed mild plaque disease. Repeat cath April 2016 with stable CAD. She was admitted to [facility] 04/25/15 with dyspnea, chest pain with moderate exertion, fatigue. Troponin negative x 3. No ischemic EKG changes. Nuclear stress test 05/03/15 was low risk with LVEF=50%, possible inferior wall attenuation defect, no ischemia. I saw her in April 2017 and she continued to have dyspnea and chest pressure. I arranged a cardiac cath but she cancelled. I saw her back in the office 06/14/15 and she c/o continued dyspnea and chest pressure. Cardiac cath 06/21/15 with moderate non-obstructive CAD, normal filling pressures.   She is here today for follow up. The patient has been having chest pressure for two months. No worsening over last week. Comes and goes. No pain but feels like a heaviness in the center of her chest. Associated dyspnea. No palpitations, lower extremity edema, orthopnea, PND, dizziness, near syncope or syncope.     Primary Care Physician: System, Provider Not In [place],  Lynelle Smoke NP ([facility] in [place])  Past Medical History:  Diagnosis Date  . AKI (acute kidney injury) ([place]) 05/16/2017  . Anxiety   . Barrett's esophagus without dysplasia   . CAD (coronary artery disease)    a. LHC 10/16/11: dLM 20%, mLAD 90%, pOM1 30%, mOM1 70% (FFR not hemodynamically significant), prox and mid RCA 30%, EF 65%;  b. PCI 10/16/11: Promus DES x 2 to mLAD  . COPD (chronic obstructive pulmonary disease) ([place])   . Depression   . Diverticulosis   . Essential hypertension   . GERD (gastroesophageal reflux disease)   . Hyperlipidemia   . Rectocele     Past Surgical History:  Procedure Laterality Date  . ABDOMINAL HYSTERECTOMY    . CARDIAC CATHETERIZATION    . CARDIAC CATHETERIZATION N/A 06/21/2015   Procedure: Right/Left Heart Cath and Coronary Angiography;  Surgeon: [name], MD;  Location: [place] CV LAB;  Service: Cardiovascular;  Laterality: N/A;  . COLONOSCOPY  01/2008   Multiple diverticula in desc and sigm colon  . ESOPHAGOGASTRODUODENOSCOPY  01/2008   Barrett's esophagus, gastritis, no h.pylori, due follow-up 01/2011  . ESOPHAGOGASTRODUODENOSCOPY  04/06/2011   Procedure: ESOPHAGOGASTRODUODENOSCOPY (EGD);  Surgeon: Danie Binder, MD;  [facility];  Service: Endoscopy;  Laterality: N/A;  8:30  . FLEXIBLE SIGMOIDOSCOPY  08/11/2010 RECTAL PAIN/PRESSURE   SML IH, TICS  . FRACTIONAL FLOW RESERVE WIRE N/A 10/16/2011   Procedure: FRACTIONAL FLOW RESERVE WIRE;  Surgeon: [name], MD;  Location:  [place] CATH LAB;  Service: Cardiovascular;  Laterality: N/A;  . LEFT HEART CATHETERIZATION WITH CORONARY ANGIOGRAM N/A 03/07/2012   Procedure: LEFT HEART CATHETERIZATION WITH CORONARY ANGIOGRAM;  Surgeon: [name], MD;  Location: [facility] CATH LAB;  Service: Cardiovascular;  Laterality: N/A;  . LEFT HEART CATHETERIZATION WITH CORONARY ANGIOGRAM N/A 12/14/2012   Procedure: LEFT HEART CATHETERIZATION WITH CORONARY ANGIOGRAM;  Surgeon:  [name], MD;  Location: [facility] CATH LAB;  Service: Cardiovascular;  Laterality: N/A;  . LEFT HEART CATHETERIZATION WITH CORONARY ANGIOGRAM N/A 05/07/2014   Procedure: LEFT HEART CATHETERIZATION WITH CORONARY ANGIOGRAM;  Surgeon: [name], MD;  Location: [facility] CATH LAB;  Service: Cardiovascular;  Laterality: N/A;  . PARTIAL HYSTERECTOMY  1980s    Current Outpatient Medications  Medication Sig Dispense Refill  . ALPRAZolam (XANAX) 0.25 MG tablet Take 0.25 mg by mouth daily as needed. For anxiety    . atorvastatin (LIPITOR) 10 MG tablet Take 10 mg by mouth at bedtime.     . clopidogrel (PLAVIX) 75 MG tablet Take 1 tablet (75 mg total) by mouth daily with breakfast. 90 tablet 0  . diphenhydramine-acetaminophen (TYLENOL PM) 25-500 MG TABS Take 2 tablets by mouth at bedtime as needed (Sleep).    . docusate sodium (COLACE) 100 MG capsule Take 100-200 mg by mouth as needed.     . isosorbide mononitrate (IMDUR) 30 MG 24 hr tablet TAKE 1 TABLET BY MOUTH  DAILY 90 tablet 2  . lisinopril-hydrochlorothiazide (PRINZIDE,ZESTORETIC) 20-12.5 MG tablet Take 1 tablet by mouth daily. Hold medication until follow-up with PCP    . memantine (NAMENDA) 10 MG tablet Take 5 mg by mouth 2 (two) times daily.    . pantoprazole (PROTONIX) 20 MG tablet Take 20 mg by mouth daily.    . Polyvinyl Alcohol-Povidone (REFRESH OP) Apply 2 drops to eye daily as needed (Scratchy eyes).    . potassium chloride SA (K-DUR,KLOR-CON) 20 MEQ tablet Take 1 tablet (20 mEq total) by mouth daily.    Marland Kitchen venlafaxine XR (EFFEXOR-XR) 37.5 MG 24 hr capsule Take 37.5 mg by mouth at bedtime.     No current facility-administered medications for this visit.     Allergies  Allergen Reactions  . Adhesive [Tape] Other (See Comments)    Turns skin red  . Sulfa Antibiotics Other (See Comments)    Childhood allergy    Social History   Socioeconomic History  . Marital status: Married    Spouse name: Not on file  . Number of  children: 1  . Years of education: Not on file  . Highest education level: Not on file  Occupational History  . Occupation: Retired Child psychotherapist    Comment: retired    Fish farm manager: RETIRED  Social Needs  . Financial resource strain: Not on file  . Food insecurity:    Worry: Not on file    Inability: Not on file  . Transportation needs:    Medical: Not on file    Non-medical: Not on file  Tobacco Use  . Smoking status: Former Smoker    Packs/day: 1.00    Years: 53.00    Pack years: 53.00    Types: Cigarettes    Last attempt to quit: 03/20/2007    Years since quitting: 10.4  . Smokeless tobacco: Never Used  Substance and Sexual Activity  . Alcohol use: No  . Drug use: No  . Sexual activity: Yes  Lifestyle  . Physical activity:    Days per week: Not on file  Minutes per session: Not on file  . Stress: Not on file  Relationships  . Social connections:    Talks on phone: Not on file    Gets together: Not on file    Attends religious service: Not on file    Active member of club or organization: Not on file    Attends meetings of clubs or organizations: Not on file    Relationship status: Not on file  . Intimate partner violence:    Fear of current or ex partner: Not on file    Emotionally abused: Not on file    Physically abused: Not on file    Forced sexual activity: Not on file  Other Topics Concern  . Not on file  Social History Narrative  . Not on file    Family History  Problem Relation Age of Onset  . Coronary artery disease Father 53  . Heart attack Father 48  . GI problems Mother        Perforated colon   . Coronary artery disease Paternal Aunt   . Coronary artery disease Paternal Uncle   . Colon cancer Neg Hx   . Gastric cancer Neg Hx   . Esophageal cancer Neg Hx     Review of Systems:  As stated in the HPI and otherwise negative.   BP 138/78   Pulse 67   Ht 5\' 2"  (1.575 m)   Wt 124 lb (56.2 kg)   BMI 22.68 kg/m   Physical  Examination: General: Well developed, well nourished, NAD  HEENT: OP clear, mucus membranes moist  SKIN: warm, dry. No rashes. Neuro: No focal deficits  Musculoskeletal: Muscle strength 5/5 all ext  Psychiatric: Mood and affect normal  Neck: No JVD, no carotid bruits, no thyromegaly, no lymphadenopathy.  Lungs:Clear bilaterally, no wheezes, rhonci, crackles Cardiovascular: Regular rate and rhythm. No murmurs, gallops or rubs. Abdomen:Soft. Bowel sounds present. Non-tender.  Extremities: No lower extremity edema. Pulses are 2 + in the bilateral DP/PT.  Cardiac Cath 06/21/15:  Ost RCA lesion, 30% stenosed.  Ost Cx to Prox Cx lesion, 20% stenosed.  Prox Cx to Mid Cx lesion, 20% stenosed.  Prox LAD lesion, 30% stenosed.  Mid LAD to Dist LAD lesion, 10% stenosed.  LM lesion, 30% stenosed.  Ost LAD lesion, 30% stenosed.  The left ventricular systolic function is normal.  Mid RCA-1 lesion, 20% stenosed.  Mid RCA-2 lesion, 30% stenosed.  Ost Ramus lesion, 50% stenosed.  1. Double vessel CAD with patent stents mid LAD and obtuse marginal 2. Mild non-obstructive disease in the distal left main, proximal LAD, proximal Circumflex and RCA.  3. Normal LV systolic function 4. Normal right sided pressures  Nuclear stress test 05/03/15:  Defect 1: There is a medium defect of moderate severity present in the basal inferoseptal and mid inferoseptal location. While soft tissue attenuation artifact is likely, scar cannot entirely be excluded. There is no ischemia.  Nuclear stress EF: 50%.  This is a low risk study.  There was no ST segment deviation noted during stress.  EKG:  EKG is ordered today. The ekg ordered today demonstrates Sinus, rate 67 bpm. PVC  Recent Labs: 05/16/2017: Hemoglobin 11.3; Platelets 303 05/19/2017: BUN 12; Creatinine, Ser 2.43; Potassium 3.4; Sodium 142   Lipid Panel Followed in primary care  Wt Readings from Last 3 Encounters:  09/13/17 124 lb (56.2  kg)  05/16/17 123 lb 7.3 oz (56 kg)  05/12/17 123 lb 6.4 oz (56 kg)  Other studies Reviewed: Additional studies/ records that were reviewed today include: . Review of the above records demonstrates:    Assessment and Plan:   1. CAD with unstable angina: She is having more chest pressure. This is like her pain prior to stent placement. This is c/w unstable angina. Will plan cardiac cath at [facility] 09/16/17 at 9am. Risks and benefits of cath reviewed with pt today. She agrees to proceed. Pre-cath labs today. Continue Imdur, Plavix and beta blocker. She will take ASA the day of her cath.     2. HTN: BP is controlled. No changes  3. HLD: Lipids followed in primary care. Continue statin.   Current medicines are reviewed at length with the patient today.  The patient does not have concerns regarding medicines.  The following changes have been made:  no change  Labs/ tests ordered today include:   No orders of the defined types were placed in this encounter.    Disposition:   FU with me after the cath.      Signed, Lauree Chandler, MD 09/13/2017 3:42 PM    [facility] [place], [place], [place]  [address] Phone: [phone]; Fax: [phone]

## 2017-09-13 NOTE — Patient Instructions (Signed)
Medication Instructions:  Your physician recommends that you continue on your current medications as directed. Please refer to the Current Medication list given to you today.   Labwork: Lab work to be done today--BMP and CBC  Testing/Procedures: Your physician has requested that you have a cardiac catheterization. Cardiac catheterization is used to diagnose and/or treat various heart conditions. Doctors may recommend this procedure for a number of different reasons. The most common reason is to evaluate chest pain. Chest pain can be a symptom of coronary artery disease (CAD), and cardiac catheterization can show whether plaque is narrowing or blocking your heart's arteries. This procedure is also used to evaluate the valves, as well as measure the blood flow and oxygen levels in different parts of your heart. For further information please visit [email or web address]. Please follow instruction sheet, as given.  Scheduled for August 29,2019  Follow-Up: Your physician recommends that you schedule a follow-up appointment in: about one month with PA or NP    Any Other Special Instructions Will Be Listed Below (If Applicable).     [facility] [place], [place] [address] Dept: [phone] Loc: [place]  09/13/2017  You are scheduled for a Cardiac Catheterization on Thursday, August 29 with Dr. Lauree Chandler.  1. Please arrive at the [facility] (Main Entrance A) at [facility]: [address] [place], [place] [address] at 7:00 AM (This time is two hours before your procedure to ensure your preparation). Free valet parking service is available.   Special note: Every effort is made to have your procedure done on time. Please understand that emergencies sometimes delay scheduled procedures.  2. Diet: Do not eat solid foods after midnight.  The patient  may have clear liquids until 5am upon the day of the procedure.  3. Labs: Lab work was done in office on August 26,2019  4. Medication instructions in preparation for your procedure:   Contrast Allergy: No  Do not take lisinopril/HCTZ and potassium the morning of the procedure    On the morning of your procedure, take your Aspirin and Clopidogrel and any morning medicines NOT listed above.  You may use sips of water.  5. Plan for one night stay--bring personal belongings. 6. Bring a current list of your medications and current insurance cards. 7. You MUST have a responsible person to drive you home. 8. Someone MUST be with you the first 24 hours after you arrive home or your discharge will be delayed. 9. Please wear clothes that are easy to get on and off and wear slip-on shoes.  Thank you for allowing [place] to care for you!   -- [facility]    If you need a refill on your cardiac medications before your next appointment, please call your pharmacy.

## 2017-09-13 NOTE — Progress Notes (Signed)
Chief Complaint  Patient presents with  . Chest Pain    ; THIS HAS BEEN GOING ON FOR 2-3 MONTHS   . Shortness of Breath    SOMETIMES WITH EXERTION AND OTHER TIMES W/O EXERTION  . Fatigue   History of Present Illness: 77 yo female with history of CAD, HTN, HLD, anxiety, Barrett's esophagus who is here today for cardiac follow up. Cardiac cath in 2013 showed a moderate 70% stenosis in the first obtuse marginal branch and a 90% stenosis in the mid LAD. FFR of the obtuse marginal branch was negative. Overlapping drug eluting stents were placed in the mid LAD.  Repeat cath February 2014 in setting of dyspnea and a drug eluting stent was placed in the OM branch. I saw her on 11/30/12 and she had c/o fatigue and SOB. Stress myoview without ischemia on images but she did have EKG changes suggestive of ischemia during exercise. Cardiac cath 12/14/12 with patent stents, moderate disease. IVUS of left main and proximal LAD confirmed mild plaque disease. Repeat cath April 2016 with stable CAD. She was admitted to [facility] 04/25/15 with dyspnea, chest pain with moderate exertion, fatigue. Troponin negative x 3. No ischemic EKG changes. Nuclear stress test 05/03/15 was low risk with LVEF=50%, possible inferior wall attenuation defect, no ischemia. I saw her in April 2017 and she continued to have dyspnea and chest pressure. I arranged a cardiac cath but she cancelled. I saw her back in the office 06/14/15 and she c/o continued dyspnea and chest pressure. Cardiac cath 06/21/15 with moderate non-obstructive CAD, normal filling pressures.   She is here today for follow up. The patient has been having chest pressure for two months. No worsening over last week. Comes and goes. No pain but feels like a heaviness in the center of her chest. Associated dyspnea. No palpitations, lower extremity edema, orthopnea, PND, dizziness, near syncope or syncope.     Primary Care Physician: System, Provider Not In [place],  Lynelle Smoke NP ([facility] in [place])  Past Medical History:  Diagnosis Date  . AKI (acute kidney injury) ([place]) 05/16/2017  . Anxiety   . Barrett's esophagus without dysplasia   . CAD (coronary artery disease)    a. LHC 10/16/11: dLM 20%, mLAD 90%, pOM1 30%, mOM1 70% (FFR not hemodynamically significant), prox and mid RCA 30%, EF 65%;  b. PCI 10/16/11: Promus DES x 2 to mLAD  . COPD (chronic obstructive pulmonary disease) ([place])   . Depression   . Diverticulosis   . Essential hypertension   . GERD (gastroesophageal reflux disease)   . Hyperlipidemia   . Rectocele     Past Surgical History:  Procedure Laterality Date  . ABDOMINAL HYSTERECTOMY    . CARDIAC CATHETERIZATION    . CARDIAC CATHETERIZATION N/A 06/21/2015   Procedure: Right/Left Heart Cath and Coronary Angiography;  Surgeon: [name], MD;  Location: [place] [facility];  Service: Cardiovascular;  Laterality: N/A;  . COLONOSCOPY  01/2008   Multiple diverticula in desc and sigm colon  . ESOPHAGOGASTRODUODENOSCOPY  01/2008   Barrett's esophagus, gastritis, no h.pylori, due follow-up 01/2011  . ESOPHAGOGASTRODUODENOSCOPY  04/06/2011   Procedure: ESOPHAGOGASTRODUODENOSCOPY (EGD);  Surgeon: Danie Binder, MD;  Location: [facility];  Service: Endoscopy;  Laterality: N/A;  8:30  . FLEXIBLE SIGMOIDOSCOPY  08/11/2010 RECTAL PAIN/PRESSURE   SML IH, TICS  . FRACTIONAL FLOW RESERVE WIRE N/A 10/16/2011   Procedure: FRACTIONAL FLOW RESERVE WIRE;  Surgeon: [name], MD;  Location:  [facility];  Service: Cardiovascular;  Laterality: N/A;  . LEFT HEART CATHETERIZATION WITH CORONARY ANGIOGRAM N/A 03/07/2012   Procedure: LEFT HEART CATHETERIZATION WITH CORONARY ANGIOGRAM;  Surgeon: [name], MD;  Location: [facility] CATH LAB;  Service: Cardiovascular;  Laterality: N/A;  . LEFT HEART CATHETERIZATION WITH CORONARY ANGIOGRAM N/A 12/14/2012   Procedure: LEFT HEART CATHETERIZATION WITH CORONARY ANGIOGRAM;  Surgeon:  [name], MD;  Location: [facility] CATH LAB;  Service: Cardiovascular;  Laterality: N/A;  . LEFT HEART CATHETERIZATION WITH CORONARY ANGIOGRAM N/A 05/07/2014   Procedure: LEFT HEART CATHETERIZATION WITH CORONARY ANGIOGRAM;  Surgeon: [name], MD;  Location: [facility] CATH LAB;  Service: Cardiovascular;  Laterality: N/A;  . PARTIAL HYSTERECTOMY  1980s    Current Outpatient Medications  Medication Sig Dispense Refill  . ALPRAZolam (XANAX) 0.25 MG tablet Take 0.25 mg by mouth daily as needed. For anxiety    . atorvastatin (LIPITOR) 10 MG tablet Take 10 mg by mouth at bedtime.     . clopidogrel (PLAVIX) 75 MG tablet Take 1 tablet (75 mg total) by mouth daily with breakfast. 90 tablet 0  . diphenhydramine-acetaminophen (TYLENOL PM) 25-500 MG TABS Take 2 tablets by mouth at bedtime as needed (Sleep).    . docusate sodium (COLACE) 100 MG capsule Take 100-200 mg by mouth as needed.     . isosorbide mononitrate (IMDUR) 30 MG 24 hr tablet TAKE 1 TABLET BY MOUTH  DAILY 90 tablet 2  . lisinopril-hydrochlorothiazide (PRINZIDE,ZESTORETIC) 20-12.5 MG tablet Take 1 tablet by mouth daily. Hold medication until follow-up with PCP    . memantine (NAMENDA) 10 MG tablet Take 5 mg by mouth 2 (two) times daily.    . pantoprazole (PROTONIX) 20 MG tablet Take 20 mg by mouth daily.    . Polyvinyl Alcohol-Povidone (REFRESH OP) Apply 2 drops to eye daily as needed (Scratchy eyes).    . potassium chloride SA (K-DUR,KLOR-CON) 20 MEQ tablet Take 1 tablet (20 mEq total) by mouth daily.    Marland Kitchen venlafaxine XR (EFFEXOR-XR) 37.5 MG 24 hr capsule Take 37.5 mg by mouth at bedtime.     No current facility-administered medications for this visit.     Allergies  Allergen Reactions  . Adhesive [Tape] Other (See Comments)    Turns skin red  . Sulfa Antibiotics Other (See Comments)    Childhood allergy    Social History   Socioeconomic History  . Marital status: Married    Spouse name: Not on file  . Number of  children: 1  . Years of education: Not on file  . Highest education level: Not on file  Occupational History  . Occupation: Retired Child psychotherapist    Comment: retired    Fish farm manager: RETIRED  Social Needs  . Financial resource strain: Not on file  . Food insecurity:    Worry: Not on file    Inability: Not on file  . Transportation needs:    Medical: Not on file    Non-medical: Not on file  Tobacco Use  . Smoking status: Former Smoker    Packs/day: 1.00    Years: 53.00    Pack years: 53.00    Types: Cigarettes    Last attempt to quit: 03/20/2007    Years since quitting: 10.4  . Smokeless tobacco: Never Used  Substance and Sexual Activity  . Alcohol use: No  . Drug use: No  . Sexual activity: Yes  Lifestyle  . Physical activity:    Days per week: Not on file  Minutes per session: Not on file  . Stress: Not on file  Relationships  . Social connections:    Talks on phone: Not on file    Gets together: Not on file    Attends religious service: Not on file    Active member of club or organization: Not on file    Attends meetings of clubs or organizations: Not on file    Relationship status: Not on file  . Intimate partner violence:    Fear of current or ex partner: Not on file    Emotionally abused: Not on file    Physically abused: Not on file    Forced sexual activity: Not on file  Other Topics Concern  . Not on file  Social History Narrative  . Not on file    Family History  Problem Relation Age of Onset  . Coronary artery disease Father 50  . Heart attack Father 64  . GI problems Mother        Perforated colon   . Coronary artery disease Paternal Aunt   . Coronary artery disease Paternal Uncle   . Colon cancer Neg Hx   . Gastric cancer Neg Hx   . Esophageal cancer Neg Hx     Review of Systems:  As stated in the HPI and otherwise negative.   BP 138/78   Pulse 67   Ht 5\' 2"  (1.575 m)   Wt 124 lb (56.2 kg)   BMI 22.68 kg/m   Physical  Examination: General: Well developed, well nourished, NAD  HEENT: OP clear, mucus membranes moist  SKIN: warm, dry. No rashes. Neuro: No focal deficits  Musculoskeletal: Muscle strength 5/5 all ext  Psychiatric: Mood and affect normal  Neck: No JVD, no carotid bruits, no thyromegaly, no lymphadenopathy.  Lungs:Clear bilaterally, no wheezes, rhonci, crackles Cardiovascular: Regular rate and rhythm. No murmurs, gallops or rubs. Abdomen:Soft. Bowel sounds present. Non-tender.  Extremities: No lower extremity edema. Pulses are 2 + in the bilateral DP/PT.  Cardiac Cath 06/21/15:  Ost RCA lesion, 30% stenosed.  Ost Cx to Prox Cx lesion, 20% stenosed.  Prox Cx to Mid Cx lesion, 20% stenosed.  Prox LAD lesion, 30% stenosed.  Mid LAD to Dist LAD lesion, 10% stenosed.  LM lesion, 30% stenosed.  Ost LAD lesion, 30% stenosed.  The left ventricular systolic function is normal.  Mid RCA-1 lesion, 20% stenosed.  Mid RCA-2 lesion, 30% stenosed.  Ost Ramus lesion, 50% stenosed.  1. Double vessel CAD with patent stents mid LAD and obtuse marginal 2. Mild non-obstructive disease in the distal left main, proximal LAD, proximal Circumflex and RCA.  3. Normal LV systolic function 4. Normal right sided pressures  Nuclear stress test 05/03/15:  Defect 1: There is a medium defect of moderate severity present in the basal inferoseptal and mid inferoseptal location. While soft tissue attenuation artifact is likely, scar cannot entirely be excluded. There is no ischemia.  Nuclear stress EF: 50%.  This is a low risk study.  There was no ST segment deviation noted during stress.  EKG:  EKG is ordered today. The ekg ordered today demonstrates Sinus, rate 67 bpm. PVC  Recent Labs: 05/16/2017: Hemoglobin 11.3; Platelets 303 05/19/2017: BUN 12; Creatinine, Ser 2.43; Potassium 3.4; Sodium 142   Lipid Panel Followed in primary care  Wt Readings from Last 3 Encounters:  09/13/17 124 lb (56.2  kg)  05/16/17 123 lb 7.3 oz (56 kg)  05/12/17 123 lb 6.4 oz (56 kg)  Other studies Reviewed: Additional studies/ records that were reviewed today include: . Review of the above records demonstrates:    Assessment and Plan:   1. CAD with unstable angina: She is having more chest pressure. This is like her pain prior to stent placement. This is c/w unstable angina. Will plan cardiac cath at [facility] 09/16/17 at 9am. Risks and benefits of cath reviewed with pt today. She agrees to proceed. Pre-cath labs today. Continue Imdur, Plavix and beta blocker. She will take ASA the day of her cath.     2. HTN: BP is controlled. No changes  3. HLD: Lipids followed in primary care. Continue statin.   Current medicines are reviewed at length with the patient today.  The patient does not have concerns regarding medicines.  The following changes have been made:  no change  Labs/ tests ordered today include:   No orders of the defined types were placed in this encounter.    Disposition:   FU with me after the cath.      Signed, Lauree Chandler, MD 09/13/2017 3:42 PM    [facility] [place], [place], [place]  [address] Phone: [phone]; Fax: [phone]

## 2017-09-14 ENCOUNTER — Telehealth: Payer: Self-pay | Admitting: *Deleted

## 2017-09-14 LAB — CBC
HEMOGLOBIN: 12.9 g/dL (ref 11.1–15.9)
Hematocrit: 37.5 % (ref 34.0–46.6)
MCH: 32.2 pg (ref 26.6–33.0)
MCHC: 34.4 g/dL (ref 31.5–35.7)
MCV: 94 fL (ref 79–97)
Platelets: 312 10*3/uL (ref 150–450)
RBC: 4.01 x10E6/uL (ref 3.77–5.28)
RDW: 12.2 % — ABNORMAL LOW (ref 12.3–15.4)
WBC: 10.9 10*3/uL — ABNORMAL HIGH (ref 3.4–10.8)

## 2017-09-14 LAB — BASIC METABOLIC PANEL
BUN/Creatinine Ratio: 6 — ABNORMAL LOW (ref 12–28)
BUN: 5 mg/dL — AB (ref 8–27)
CO2: 27 mmol/L (ref 20–29)
CREATININE: 0.83 mg/dL (ref 0.57–1.00)
Calcium: 9.6 mg/dL (ref 8.7–10.3)
Chloride: 92 mmol/L — ABNORMAL LOW (ref 96–106)
GFR calc non Af Amer: 69 mL/min/{1.73_m2} (ref >59)
GFR, EST AFRICAN AMERICAN: 80 mL/min/{1.73_m2} (ref >59)
Glucose: 84 mg/dL (ref 65–99)
Potassium: 3.6 mmol/L (ref 3.5–5.2)
Sodium: 133 mmol/L — ABNORMAL LOW (ref 134–144)

## 2017-09-14 NOTE — Telephone Encounter (Signed)
Pt contacted pre-catheterization scheduled at [facility] for: Thursday September 16, 2017 9 AM Verified arrival time and place: [place] Entrance A at: 7 AM  No solid food after midnight prior to cath, clear liquids until 5 AM day of procedure. Verified allergies in Epic Verified no diabetes medications.  Hold; Lisinopril -HCTZ-AM of procedure. KCL-AM of procedure.  Except hold medications AM meds can be  taken pre-cath with sip of water including: ASA 81 mg Clopidogrel 75 mg  Confirmed patient has responsible person to drive home post procedure and for 24 hours after you arrive home: yes

## 2017-09-16 ENCOUNTER — Encounter (HOSPITAL_COMMUNITY)
Admission: RE | Disposition: A | Discharge status: Home / Self Care | Payer: Self-pay | Source: Ambulatory Visit | Attending: Cardiovascular Disease

## 2017-09-16 ENCOUNTER — Inpatient Hospital Stay (HOSPITAL_COMMUNITY): Payer: Medicare Other

## 2017-09-16 ENCOUNTER — Other Ambulatory Visit (HOSPITAL_COMMUNITY): Payer: Medicare Other

## 2017-09-16 ENCOUNTER — Other Ambulatory Visit: Payer: Self-pay

## 2017-09-16 ENCOUNTER — Observation Stay (HOSPITAL_COMMUNITY)
Admission: RE | Admit: 2017-09-16 | Discharge: 2017-09-17 | Disposition: A | Discharge status: Home / Self Care | Payer: Medicare Other | Source: Ambulatory Visit | Attending: Cardiovascular Disease | Admitting: Cardiovascular Disease

## 2017-09-16 ENCOUNTER — Other Ambulatory Visit: Payer: Self-pay | Admitting: *Deleted

## 2017-09-16 ENCOUNTER — Encounter (HOSPITAL_COMMUNITY): Payer: Self-pay | Admitting: General Practice

## 2017-09-16 DIAGNOSIS — K219 Gastro-esophageal reflux disease without esophagitis: Secondary | ICD-10-CM | POA: Insufficient documentation

## 2017-09-16 DIAGNOSIS — Z7902 Long term (current) use of antithrombotics/antiplatelets: Secondary | ICD-10-CM | POA: Diagnosis not present

## 2017-09-16 DIAGNOSIS — Z8249 Family history of ischemic heart disease and other diseases of the circulatory system: Secondary | ICD-10-CM | POA: Diagnosis not present

## 2017-09-16 DIAGNOSIS — I071 Rheumatic tricuspid insufficiency: Secondary | ICD-10-CM | POA: Diagnosis not present

## 2017-09-16 DIAGNOSIS — R5383 Other fatigue: Secondary | ICD-10-CM | POA: Diagnosis not present

## 2017-09-16 DIAGNOSIS — Z90711 Acquired absence of uterus with remaining cervical stump: Secondary | ICD-10-CM | POA: Insufficient documentation

## 2017-09-16 DIAGNOSIS — I10 Essential (primary) hypertension: Secondary | ICD-10-CM | POA: Diagnosis present

## 2017-09-16 DIAGNOSIS — I361 Nonrheumatic tricuspid (valve) insufficiency: Secondary | ICD-10-CM

## 2017-09-16 DIAGNOSIS — I25110 Atherosclerotic heart disease of native coronary artery with unstable angina pectoris: Secondary | ICD-10-CM | POA: Diagnosis not present

## 2017-09-16 DIAGNOSIS — E785 Hyperlipidemia, unspecified: Secondary | ICD-10-CM | POA: Insufficient documentation

## 2017-09-16 DIAGNOSIS — Z87891 Personal history of nicotine dependence: Secondary | ICD-10-CM | POA: Diagnosis not present

## 2017-09-16 DIAGNOSIS — Z888 Allergy status to other drugs, medicaments and biological substances status: Secondary | ICD-10-CM | POA: Insufficient documentation

## 2017-09-16 DIAGNOSIS — F419 Anxiety disorder, unspecified: Secondary | ICD-10-CM | POA: Insufficient documentation

## 2017-09-16 DIAGNOSIS — R0602 Shortness of breath: Secondary | ICD-10-CM | POA: Insufficient documentation

## 2017-09-16 DIAGNOSIS — I5030 Unspecified diastolic (congestive) heart failure: Secondary | ICD-10-CM | POA: Insufficient documentation

## 2017-09-16 DIAGNOSIS — Z955 Presence of coronary angioplasty implant and graft: Secondary | ICD-10-CM | POA: Insufficient documentation

## 2017-09-16 DIAGNOSIS — Z882 Allergy status to sulfonamides status: Secondary | ICD-10-CM | POA: Diagnosis not present

## 2017-09-16 DIAGNOSIS — K2270 Barrett's esophagus without dysplasia: Secondary | ICD-10-CM | POA: Insufficient documentation

## 2017-09-16 DIAGNOSIS — I2510 Atherosclerotic heart disease of native coronary artery without angina pectoris: Secondary | ICD-10-CM

## 2017-09-16 DIAGNOSIS — R079 Chest pain, unspecified: Secondary | ICD-10-CM

## 2017-09-16 DIAGNOSIS — I110 Hypertensive heart disease with heart failure: Secondary | ICD-10-CM | POA: Insufficient documentation

## 2017-09-16 DIAGNOSIS — Z79899 Other long term (current) drug therapy: Secondary | ICD-10-CM | POA: Diagnosis not present

## 2017-09-16 DIAGNOSIS — I200 Unstable angina: Secondary | ICD-10-CM | POA: Diagnosis present

## 2017-09-16 DIAGNOSIS — J449 Chronic obstructive pulmonary disease, unspecified: Secondary | ICD-10-CM | POA: Insufficient documentation

## 2017-09-16 DIAGNOSIS — R911 Solitary pulmonary nodule: Secondary | ICD-10-CM

## 2017-09-16 DIAGNOSIS — Z9889 Other specified postprocedural states: Secondary | ICD-10-CM | POA: Diagnosis not present

## 2017-09-16 DIAGNOSIS — K7689 Other specified diseases of liver: Secondary | ICD-10-CM

## 2017-09-16 HISTORY — PX: LEFT HEART CATH AND CORONARY ANGIOGRAPHY: CATH118249

## 2017-09-16 HISTORY — PX: CARDIAC CATHETERIZATION: SHX172

## 2017-09-16 LAB — ECHOCARDIOGRAM COMPLETE
Height: 61.5 in
Weight: 1937.6 oz

## 2017-09-16 LAB — PULMONARY FUNCTION TEST
FEF 25-75 Pre: 0.74 L/sec
FEF2575-%Pred-Pre: 48 %
FEV1-%Pred-Pre: 86 %
FEV1-Pre: 1.63 L
FEV1FVC-%Pred-Pre: 81 %
FEV6-%Pred-Pre: 108 %
FEV6-Pre: 2.59 L
FEV6FVC-%Pred-Pre: 102 %
FVC-%Pred-Pre: 106 %
FVC-Pre: 2.67 L
Pre FEV1/FVC ratio: 61 %
Pre FEV6/FVC Ratio: 97 %

## 2017-09-16 SURGERY — LEFT HEART CATH AND CORONARY ANGIOGRAPHY
Anesthesia: LOCAL

## 2017-09-16 MED ORDER — LISINOPRIL-HYDROCHLOROTHIAZIDE 20-12.5 MG PO TABS: 1.0000 | ORAL_TABLET | Freq: Every day | ORAL | Status: DC

## 2017-09-16 MED ORDER — HEPARIN (PORCINE) IN NACL 1000-0.9 UT/500ML-% IV SOLN
INTRAVENOUS | Status: AC
  Filled 2017-09-16: qty 1000

## 2017-09-16 MED ORDER — SODIUM CHLORIDE 0.9% FLUSH: 3.0000 mL | Freq: Two times a day (BID) | INTRAVENOUS | Status: DC

## 2017-09-16 MED ORDER — MEMANTINE HCL 10 MG PO TABS
10.0000 mg | ORAL_TABLET | Freq: Two times a day (BID) | ORAL | Status: DC
  Administered 2017-09-16 – 2017-09-17 (×3): 10 mg via ORAL
  Filled 2017-09-16 (×3): qty 1

## 2017-09-16 MED ORDER — LIDOCAINE HCL (PF) 1 % IJ SOLN
INTRAMUSCULAR | Status: AC
  Filled 2017-09-16: qty 30

## 2017-09-16 MED ORDER — PANTOPRAZOLE SODIUM 20 MG PO TBEC
20.0000 mg | DELAYED_RELEASE_TABLET | Freq: Every day | ORAL | Status: DC
  Administered 2017-09-16 – 2017-09-17 (×2): 20 mg via ORAL
  Filled 2017-09-16 (×2): qty 1

## 2017-09-16 MED ORDER — FENTANYL CITRATE (PF) 100 MCG/2ML IJ SOLN
INTRAMUSCULAR | Status: AC
  Filled 2017-09-16: qty 2

## 2017-09-16 MED ORDER — SODIUM CHLORIDE 0.9% FLUSH
3.0000 mL | Freq: Two times a day (BID) | INTRAVENOUS | Status: DC
  Administered 2017-09-16 – 2017-09-17 (×3): 3 mL via INTRAVENOUS

## 2017-09-16 MED ORDER — SODIUM CHLORIDE 0.9% FLUSH: 3.0000 mL | INTRAVENOUS | Status: DC | PRN

## 2017-09-16 MED ORDER — HYDROCHLOROTHIAZIDE 12.5 MG PO CAPS
12.5000 mg | ORAL_CAPSULE | Freq: Every day | ORAL | Status: DC
  Administered 2017-09-16: 12.5 mg via ORAL
  Filled 2017-09-16: qty 1

## 2017-09-16 MED ORDER — IOPAMIDOL (ISOVUE-370) INJECTION 76%
INTRAVENOUS | Status: DC | PRN
  Administered 2017-09-16: 80 mL

## 2017-09-16 MED ORDER — ATORVASTATIN CALCIUM 20 MG PO TABS
20.0000 mg | ORAL_TABLET | Freq: Every day | ORAL | Status: DC
  Administered 2017-09-16: 20 mg via ORAL
  Filled 2017-09-16: qty 1

## 2017-09-16 MED ORDER — ASPIRIN 81 MG PO CHEW: 81.0000 mg | CHEWABLE_TABLET | ORAL | Status: DC

## 2017-09-16 MED ORDER — MIDAZOLAM HCL 2 MG/2ML IJ SOLN
INTRAMUSCULAR | Status: DC | PRN
  Administered 2017-09-16: 1 mg via INTRAVENOUS

## 2017-09-16 MED ORDER — ONDANSETRON HCL 4 MG/2ML IJ SOLN: 4.0000 mg | Freq: Four times a day (QID) | INTRAMUSCULAR | Status: DC | PRN

## 2017-09-16 MED ORDER — HEPARIN (PORCINE) IN NACL 1000-0.9 UT/500ML-% IV SOLN
INTRAVENOUS | Status: DC | PRN
  Administered 2017-09-16 (×2): 500 mL

## 2017-09-16 MED ORDER — SODIUM CHLORIDE 0.9 % IV SOLN
INTRAVENOUS | Status: AC
  Administered 2017-09-16: 08:00:00 via INTRAVENOUS

## 2017-09-16 MED ORDER — HEPARIN SODIUM (PORCINE) 1000 UNIT/ML IJ SOLN
INTRAMUSCULAR | Status: AC
  Filled 2017-09-16: qty 1

## 2017-09-16 MED ORDER — ISOSORBIDE MONONITRATE ER 30 MG PO TB24
30.0000 mg | ORAL_TABLET | Freq: Every day | ORAL | Status: DC
  Administered 2017-09-16: 30 mg via ORAL
  Filled 2017-09-16: qty 1

## 2017-09-16 MED ORDER — IOPAMIDOL (ISOVUE-370) INJECTION 76%
INTRAVENOUS | Status: AC
  Filled 2017-09-16: qty 100

## 2017-09-16 MED ORDER — ALPRAZOLAM 0.25 MG PO TABS
0.2500 mg | ORAL_TABLET | Freq: Two times a day (BID) | ORAL | Status: DC | PRN
  Administered 2017-09-16 – 2017-09-17 (×4): 0.25 mg via ORAL
  Filled 2017-09-16 (×4): qty 1

## 2017-09-16 MED ORDER — LISINOPRIL 20 MG PO TABS
20.0000 mg | ORAL_TABLET | Freq: Every day | ORAL | Status: DC
  Administered 2017-09-16: 20 mg via ORAL
  Filled 2017-09-16: qty 1

## 2017-09-16 MED ORDER — SODIUM CHLORIDE 0.9 % IV SOLN
INTRAVENOUS | Status: AC
  Administered 2017-09-16: 10:00:00 via INTRAVENOUS

## 2017-09-16 MED ORDER — HYDRALAZINE HCL 20 MG/ML IJ SOLN
INTRAMUSCULAR | Status: DC | PRN
  Administered 2017-09-16: 10 mg via INTRAVENOUS

## 2017-09-16 MED ORDER — FENTANYL CITRATE (PF) 100 MCG/2ML IJ SOLN
INTRAMUSCULAR | Status: DC | PRN
  Administered 2017-09-16: 25 ug via INTRAVENOUS

## 2017-09-16 MED ORDER — VENLAFAXINE HCL ER 37.5 MG PO CP24
37.5000 mg | ORAL_CAPSULE | Freq: Every day | ORAL | Status: DC
  Administered 2017-09-16: 37.5 mg via ORAL
  Filled 2017-09-16 (×2): qty 1

## 2017-09-16 MED ORDER — SODIUM CHLORIDE 0.9 % IV SOLN: 250.0000 mL | INTRAVENOUS | Status: DC | PRN

## 2017-09-16 MED ORDER — ZOLPIDEM TARTRATE 5 MG PO TABS: 5.0000 mg | ORAL_TABLET | Freq: Every evening | ORAL | Status: DC | PRN

## 2017-09-16 MED ORDER — VERAPAMIL HCL 2.5 MG/ML IV SOLN
INTRAVENOUS | Status: AC
  Filled 2017-09-16: qty 2

## 2017-09-16 MED ORDER — VERAPAMIL HCL 2.5 MG/ML IV SOLN
INTRAVENOUS | Status: DC | PRN
  Administered 2017-09-16: 10 mL via INTRA_ARTERIAL

## 2017-09-16 MED ORDER — ACETAMINOPHEN 325 MG PO TABS: 650.0000 mg | ORAL_TABLET | ORAL | Status: DC | PRN

## 2017-09-16 MED ORDER — HYDRALAZINE HCL 20 MG/ML IJ SOLN
INTRAMUSCULAR | Status: AC
  Filled 2017-09-16: qty 1

## 2017-09-16 MED ORDER — MIDAZOLAM HCL 2 MG/2ML IJ SOLN
INTRAMUSCULAR | Status: AC
  Filled 2017-09-16: qty 2

## 2017-09-16 MED ORDER — LIDOCAINE HCL (PF) 1 % IJ SOLN
INTRAMUSCULAR | Status: DC | PRN
  Administered 2017-09-16: 2 mL via INTRADERMAL

## 2017-09-16 MED ORDER — HEPARIN SODIUM (PORCINE) 1000 UNIT/ML IJ SOLN
INTRAMUSCULAR | Status: DC | PRN
  Administered 2017-09-16: 3000 [IU] via INTRAVENOUS

## 2017-09-16 SURGICAL SUPPLY — 12 items

## 2017-09-16 NOTE — Progress Notes (Signed)
After RN remove 3cc of air from TR band that was placed on R radial from cath lab, patient started to bleed, RN put 5cc of air back in TR band, now 14 cc of air left in TR band.

## 2017-09-16 NOTE — Progress Notes (Signed)
  Echocardiogram 2D Echocardiogram has been performed.  Rhonda Hughes [name] 09/16/2017, 1:21 PM

## 2017-09-16 NOTE — Progress Notes (Signed)
Discussed sternal precautions, mobility, IS, and d/c planning with pt, husband, and pastor. Good reception. She likes to be mobile, asking if she can walk here and go home. I recommended she hear from her surgeon first with plan. If for d/c, she can walk the hall (has LM dz). Discussed her angina. Her husband will be with her. IS is on order and I asked secretary to deliver one to her room when they arrive. Gave resources to read/review. [place], ACSM 3:17 PM 09/16/2017

## 2017-09-16 NOTE — Consult Note (Addendum)
[address]       [place] [address]             [phone]        Etoile M Keiper [place] Medical Record #[id number] Date of Birth: 12-19-1940  Referring: Dr. Angelena Form Primary Care: System, Provider Not In Primary Cardiologist:Christopher Angelena Form, MD  Chief Complaint:   Multivessel CAD  History of Present Illness:      Mrs. Granato is a 77 yo white female with known history of HTN, Hyperlipidemia, Anxiety, Barrett esophagus.  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  She has an extensive cardiac history dating back to 2013 at which time she underwent PCI x 2 to her LAD.  She had a repeat cath in 2014 and again underwent cardiac catheterization with subsequent stent placement to the OM branch.  In November of 2014 she presented with complaints of fatigue and shortness of breath.  Myoview stress test was negative and repeat cath showed patent stents.  She underwent repeat catheterization in April 2016 which showed stable CAD.  She was admitted to [facility] on 04/25/15 with dyspnea, chest pain with moderate exertion, and fatigue.  Her troponins during that admission was negative.  She had no acute EKG changes.  Stress test was obtained and was felt to be low risk with preserved EF.  She was again evaluated in April with continued dyspnea and chest pressure.  She was set up for repeat cardiac catheterization, however the patient canceled. She again presented for follow up in 05/2015 with continued complaints of chest pressure and continued dyspnea.  Cardiac catheterization showed moderate non-obstructive CAD.  The patient again presented for routine follow up on 09/13/2017.  She continued to complain of chest pressure and shortness of breath with exertion.  The patient is in the epigastric area and comes and goes.  She denied N/V, diaphoresis, and palpitations.  It was felt the patient would require cardiac catheterization as  she was continuing to have chest discomfort.  Most recently similar in nature to prior to her stents being placed.  This was performed today and showed and showed multivessel CAD with LM involvement.  It was felt coronary bypass grafting would be indicated and TCTS consult was requested.  Currently the patient is chest pain free.  She states her symptoms are mainly short of breath.  She remains very active.  She wants to go home prior to surgery.  She was on Plavix prior to admission and she doesn't want to stay in the hospital for her washout.  Current Activity/ Functional Status: Patient is independent with mobility/ambulation, transfers, ADL's, IADL's.   Zubrod Score: At the time of surgery this patient's most appropriate activity status/level should be described as: []     0    Normal activity, no symptoms []     1    Restricted in physical strenuous activity but ambulatory, able to do out light work []     2    Ambulatory and capable of self care, unable to do work activities, up and about                 more than 50%  Of the time                            []     3    Only limited  self care, in bed greater than 50% of waking hours []     4    Completely disabled, no self care, confined to bed or chair []     5    Moribund  Past Medical History:  Diagnosis Date  . AKI (acute kidney injury) ([place]) 05/16/2017  . Anxiety   . Barrett's esophagus without dysplasia   . CAD (coronary artery disease)    a. LHC 10/16/11: dLM 20%, mLAD 90%, pOM1 30%, mOM1 70% (FFR not hemodynamically significant), prox and mid RCA 30%, EF 65%;  b. PCI 10/16/11: Promus DES x 2 to mLAD  . COPD (chronic obstructive pulmonary disease) ([place])   . Depression   . Diverticulosis   . Essential hypertension   . GERD (gastroesophageal reflux disease)   . Hyperlipidemia   . Rectocele     Past Surgical History:  Procedure Laterality Date  . ABDOMINAL HYSTERECTOMY    . CARDIAC CATHETERIZATION    . CARDIAC CATHETERIZATION N/A  06/21/2015   Procedure: Right/Left Heart Cath and Coronary Angiography;  Surgeon: [name], MD;  Location: [facility] LAB;  Service: Cardiovascular;  Laterality: N/A;  . CARDIAC CATHETERIZATION  09/16/2017  . COLONOSCOPY  01/2008   Multiple diverticula in desc and sigm colon  . ESOPHAGOGASTRODUODENOSCOPY  01/2008   Barrett's esophagus, gastritis, no h.pylori, due follow-up 01/2011  . ESOPHAGOGASTRODUODENOSCOPY  04/06/2011   Procedure: ESOPHAGOGASTRODUODENOSCOPY (EGD);  Surgeon: Danie Binder, MD;  [facility];  Service: Endoscopy;  Laterality: N/A;  8:30  . FLEXIBLE SIGMOIDOSCOPY  08/11/2010 RECTAL PAIN/PRESSURE   SML IH, TICS  . FRACTIONAL FLOW RESERVE WIRE N/A 10/16/2011   Procedure: FRACTIONAL FLOW RESERVE WIRE;  Surgeon: [name], MD;  Location: [facility] CATH LAB;  Service: Cardiovascular;  Laterality: N/A;  . LEFT HEART CATH AND CORONARY ANGIOGRAPHY N/A 09/16/2017   Procedure: LEFT HEART CATH AND CORONARY ANGIOGRAPHY;  Surgeon: [name], MD;  Location: [facility];  Service: Cardiovascular;  Laterality: N/A;  . LEFT HEART CATHETERIZATION WITH CORONARY ANGIOGRAM N/A 03/07/2012   Procedure: LEFT HEART CATHETERIZATION WITH CORONARY ANGIOGRAM;  Surgeon: [name], MD;  Location: [facility] CATH LAB;  Service: Cardiovascular;  Laterality: N/A;  . LEFT HEART CATHETERIZATION WITH CORONARY ANGIOGRAM N/A 12/14/2012   Procedure: LEFT HEART CATHETERIZATION WITH CORONARY ANGIOGRAM;  Surgeon: [name], MD;  Location: [facility] CATH LAB;  Service: Cardiovascular;  Laterality: N/A;  . LEFT HEART CATHETERIZATION WITH CORONARY ANGIOGRAM N/A 05/07/2014   Procedure: LEFT HEART CATHETERIZATION WITH CORONARY ANGIOGRAM;  Surgeon: [name], MD;  Location: [facility] CATH LAB;  Service: Cardiovascular;  Laterality: N/A;  . PARTIAL HYSTERECTOMY  1980s    Social History   Tobacco Use  Smoking Status Former Smoker  . Packs/day: 1.00  . Years:  53.00  . Pack years: 53.00  . Types: Cigarettes  . Last attempt to quit: 03/20/2007  . Years since quitting: 10.5  Smokeless Tobacco Never Used    Social History   Substance and Sexual Activity  Alcohol Use No     Allergies  Allergen Reactions  . Adhesive [Tape] Other (See Comments)    Turns skin red  . Sulfa Antibiotics Other (See Comments)    Childhood allergy    Current Facility-Administered Medications  Medication Dose Route Frequency Provider Last Rate Last Dose  . 0.9 %  sodium chloride infusion   Intravenous Continuous Lauree Chandler D, MD      . 0.9 %  sodium chloride infusion  250 mL Intravenous PRN [name], MD      . acetaminophen (TYLENOL) tablet 650 mg  650 mg Oral Q4H PRN [name], MD      . ALPRAZolam Duanne Moron) tablet 0.25 mg  0.25 mg Oral BID PRN [name], MD      . atorvastatin (LIPITOR) tablet 20 mg  20 mg Oral QHS [name], MD      . lisinopril (PRINIVIL,ZESTRIL) tablet 20 mg  20 mg Oral QHS Skeet Simmer, [facility]       And  . hydrochlorothiazide (MICROZIDE) capsule 12.5 mg  12.5 mg Oral QHS Skeet Simmer, RPH      . isosorbide mononitrate (IMDUR) 24 hr tablet 30 mg  30 mg Oral QHS [name], MD      . memantine [facility]) tablet 10 mg  10 mg Oral BID [name], MD   10 mg at 09/16/17 1417  . ondansetron (ZOFRAN) injection 4 mg  4 mg Intravenous Q6H PRN [name], MD      . pantoprazole (PROTONIX) EC tablet 20 mg  20 mg Oral Daily [name], MD   20 mg at 09/16/17 1418  . sodium chloride flush (NS) 0.9 % injection 3 mL  3 mL Intravenous Q12H [name], MD   3 mL at 09/16/17 1418  . sodium chloride flush (NS) 0.9 % injection 3 mL  3 mL Intravenous PRN [name], MD      . venlafaxine XR (EFFEXOR-XR) 24 hr capsule 37.5 mg  37.5 mg Oral QHS [name], MD      . zolpidem (AMBIEN) tablet 5 mg  5 mg Oral QHS  PRN [name], MD        Medications Prior to Admission  Medication Sig Dispense Refill Last Dose  . ALPRAZolam (XANAX) 0.25 MG tablet Take 0.25 mg by mouth 2 (two) times daily as needed for anxiety.    09/15/2017 at 2100  . atorvastatin (LIPITOR) 10 MG tablet Take 10 mg by mouth at bedtime.    09/15/2017 at 2100  . clopidogrel (PLAVIX) 75 MG tablet Take 1 tablet (75 mg total) by mouth daily with breakfast. 90 tablet 0 09/16/2017 at 0515  . diphenhydramine-acetaminophen (TYLENOL PM) 25-500 MG TABS Take 2 tablets by mouth at bedtime as needed (for sleep).    Past Week at Unknown time  . docusate sodium (COLACE) 100 MG capsule Take 200 mg by mouth daily as needed for mild constipation.    Past Week at Unknown time  . isosorbide mononitrate (IMDUR) 30 MG 24 hr tablet TAKE 1 TABLET BY MOUTH  DAILY (Patient taking differently: Take 30 mg by mouth at bedtime. ) 90 tablet 2 09/15/2017 at 2100  . lisinopril-hydrochlorothiazide (PRINZIDE,ZESTORETIC) 20-12.5 MG tablet Take 1 tablet by mouth daily. Hold medication until follow-up with PCP (Patient taking differently: Take 1 tablet by mouth at bedtime. )   09/15/2017 at 2100  . memantine (NAMENDA) 10 MG tablet Take 10 mg by mouth 2 (two) times daily.    09/15/2017 at 2100  . pantoprazole (PROTONIX) 20 MG tablet Take 20 mg by mouth daily.   09/15/2017 at 2100  . Polyvinyl Alcohol-Povidone PF (REFRESH) 1.4-0.6 % SOLN Place 1-2 drops into both eyes 2 (two) times daily as needed (for eye irritation).    Past Week at Unknown time  . potassium chloride SA (K-DUR,KLOR-CON) 20 MEQ tablet Take 1 tablet (20 mEq total) by mouth  daily.   09/15/2017 at 0900  . venlafaxine XR (EFFEXOR-XR) 37.5 MG 24 hr capsule Take 37.5 mg by mouth at bedtime.   09/15/2017 at 2100  . zolpidem (AMBIEN) 10 MG tablet Take 10 mg by mouth at bedtime.   09/15/2017 at 2100    Family History  Problem Relation Age of Onset  . Coronary artery disease Father 81  . Heart attack Father 58  .  GI problems Mother        Perforated colon   . Coronary artery disease Paternal Aunt   . Coronary artery disease Paternal Uncle   . Colon cancer Neg Hx   . Gastric cancer Neg Hx   . Esophageal cancer Neg Hx    Review of Systems:   ROS Constitutional: negative Respiratory: positive for dyspnea on exertion Cardiovascular: positive for chest pressure/discomfort Gastrointestinal: negative Neurological: negative     Cardiac Review of Systems: Y or  [    ]= no  Chest Pain [ Y   ]  Resting SOB [   ] Exertional SOB  [Y  ]  Orthopnea [  ]   Pedal Edema [ N  ]    Palpitations [  ] Syncope  [  ]   Presyncope [   ]  General Review of Systems: [Y] = yes [  ]=no Constitional: recent weight change Aqua.Slicker  ]; anorexia [  ]; fatigue [  ]; nausea [  ]; night sweats [  ]; fever [N  ]; or chills [  ]                                                               Dental: Last Dentist visit: approximately 1 month ago   Eye : blurred vision [  ]; diplopia [   ]; vision changes [  ];  Amaurosis fugax[  ]; Resp: cough [  ];  wheezing[  ];  hemoptysis[  ]; shortness of breath[Y  ]; paroxysmal nocturnal dyspnea[  ]; dyspnea on exertion[  ]; or orthopnea[  ];  GI:  gallstones[  ], vomiting[ N ];  dysphagia[  ]; melena[  ];  hematochezia [  ]; heartburn[  ];   Hx of  Colonoscopy[  ]; GU: kidney stones [  ]; hematuria[  ];   dysuria [  ];  nocturia[  ];  history of     obstruction [  ]; urinary frequency [  ]             Skin: rash, swelling[N  ];, hair loss[  ];  peripheral edema[ N ];  or itching[  ]; Musculosketetal: myalgias[  ];  joint swelling[  ];  joint erythema[  ];  joint pain[  ];  back pain[  ];  Heme/Lymph: bruising[  ];  bleeding[  ];  anemia[  ];  Neuro: TIA[  ];  headaches[  ];  stroke[  ];  vertigo[  ];  seizures[  ];   paresthesias[  ];  difficulty walking[  ];  Psych:depression[  ]; anxiety[N ];  Physical Exam: BP (!) 115/50 (BP Location: Left Arm)   Pulse 76   Temp 97.9 F (36.6 C) (Oral)    Resp 16   Ht 5' 1.5" (1.562 m)   Wt 54.9 kg  SpO2 96%   BMI 22.51 kg/m   General appearance: alert, cooperative and no distress Head: Normocephalic, without obvious abnormality, atraumatic Neck: no adenopathy, no carotid bruit, no JVD, supple, symmetrical, trachea midline and thyroid not enlarged, symmetric, no tenderness/mass/nodules Resp: clear to auscultation bilaterally Cardio: regular rate and rhythm GI: soft, non-tender; bowel sounds normal; no masses,  no organomegaly Extremities: extremities normal, atraumatic, no cyanosis or edema Neurologic: Grossly normal  Diagnostic Studies & Laboratory data:     LM lesion is 30% stenosed.  Prox LAD lesion is 30% stenosed.  Mid LAD to Dist LAD lesion is 10% stenosed.  Ost LAD lesion is 90% stenosed.  Ost Ramus lesion is 50% stenosed.  Ost Cx to Prox Cx lesion is 80% stenosed.  Prox Cx to Mid Cx lesion is 20% stenosed.  Ost RCA lesion is 30% stenosed.  Mid RCA-1 lesion is 20% stenosed.  Mid RCA-2 lesion is 65% stenosed.  Mid LM to Ost LAD lesion is 80% stenosed.  Previously placed 2nd Mrg stent (unknown type) is widely patent.  Previously placed Prox LAD to Mid LAD stent (unknown type) is widely patent.  Ost 1st Mrg lesion is 80% stenosed.  The left ventricular systolic function is normal.  LV end diastolic pressure is normal.  The left ventricular ejection fraction is 55-65% by visual estimate.  There is no mitral valve regurgitation.   1. Severe distal left main artery stenosis with hazy appearance involving the ostia of the LAD, intermediate and Circumflex arteries.  2. Severe ostial LAD stenosis. (Best seen in the LAO 43/CAU 18 view). Patent mid LAD stents.  3. Severe ostial intermediate branch stenosis 4. Severe ostial Circumflex stenosis (best seen in the LAO 49/CAU 8 view). Patent obtuse marginal stent 5. Moderately severe stenosis in the mid RCA 6. Normal LV systolic function  I have independently  reviewed the above radiologic studies and discussed with the patient   Recent Lab Findings: Lab Results  Component Value Date   WBC 10.9 (H) 09/13/2017   HGB 12.9 09/13/2017   HCT 37.5 09/13/2017   PLT 312 09/13/2017   GLUCOSE 84 09/13/2017   ALT 15 04/24/2015   AST 20 04/24/2015   NA 133 (L) 09/13/2017   K 3.6 09/13/2017   CL 92 (L) 09/13/2017   CREATININE 0.83 09/13/2017   BUN 5 (L) 09/13/2017   CO2 27 09/13/2017   TSH 0.467 04/24/2015   INR 0.94 06/14/2015    Assessment / Plan:      1. Multivessel CAD- H/O stents in 2013,2014-- she has been complaining of pain and shortness of breath since original presentation in 2013.  She was on Plavix preoperatively, this is currently on hold.  She is chest pain free currently 2. HTN 3. Hyperlipidemia 4. Dispo- patient chest pain free, will require Plavix washout prior to proceeding with surgery.  Planning for next Thursday.. Patient wishes to be discharged home prior to surgery, will defer to Cardiology service if patient is able to be discharged prior to surgery.  Dr. Prescott Gum will evaluate patient and follow up with further recommendations.    I  spent 55 minutes counseling the patient face to face.  Junie Panning Barrett PA-C 09/16/2017 3:04 PM  Patient examined, coronary angiogram and echocardiogram images personally reviewed and counseled patient and husband. Patient has had chest pressure like sensation with history of previous PCI to the LAD and circumflex.  Cardiac catheterization today shows significant distal left main stenosis. She would benefit from bypass graft to  the LAD circumflex marginal and ramus.  She will need a Plavix washout prior to surgery.  I agree with the plan for allowing the patient to be discharged and return for scheduled surgery next week, probably Thursday, September 4. I discussed the procedure of CABG in detail with the patient and her husband including the expected benefits, the usual recovery, and the potential  risks.

## 2017-09-16 NOTE — Progress Notes (Signed)
9:15AM  Patient receive from cath lab with TR band on right radial, noticed R hand is bluish in color upon arrival but with full sensation and has positive pulses. 12cc of air in TR band per report. Patient has no complaints, will monitor accordingly.

## 2017-09-16 NOTE — Interval H&P Note (Signed)
History and Physical Interval Note:  09/16/2017 7:32 AM  Rhonda Hughes  has presented today for cardiac cath with the diagnosis of unstable angina.  The various methods of treatment have been discussed with the patient and family. After consideration of risks, benefits and other options for treatment, the patient has consented to  Procedure(s): LEFT HEART CATH AND CORONARY ANGIOGRAPHY (N/A) as a surgical intervention .  The patient's history has been reviewed, patient examined, no change in status, stable for surgery.  I have reviewed the patient's chart and labs.  Questions were answered to the patient's satisfaction.    Cath Lab Visit (complete for each Cath Lab visit)  Clinical Evaluation Leading to the Procedure:   ACS: No.  Non-ACS:    Anginal Classification: CCS III  Anti-ischemic medical therapy: Minimal Therapy (1 class of medications)  Non-Invasive Test Results: No non-invasive testing performed  Prior CABG: No previous CABG         Lauree Chandler

## 2017-09-17 ENCOUNTER — Other Ambulatory Visit: Payer: Self-pay | Admitting: *Deleted

## 2017-09-17 ENCOUNTER — Inpatient Hospital Stay (HOSPITAL_BASED_OUTPATIENT_CLINIC_OR_DEPARTMENT_OTHER): Payer: Medicare Other

## 2017-09-17 ENCOUNTER — Inpatient Hospital Stay (HOSPITAL_COMMUNITY): Payer: Medicare Other

## 2017-09-17 ENCOUNTER — Observation Stay (HOSPITAL_COMMUNITY): Payer: Medicare Other

## 2017-09-17 DIAGNOSIS — I10 Essential (primary) hypertension: Secondary | ICD-10-CM | POA: Diagnosis not present

## 2017-09-17 DIAGNOSIS — I071 Rheumatic tricuspid insufficiency: Secondary | ICD-10-CM | POA: Diagnosis not present

## 2017-09-17 DIAGNOSIS — I25110 Atherosclerotic heart disease of native coronary artery with unstable angina pectoris: Secondary | ICD-10-CM | POA: Diagnosis not present

## 2017-09-17 DIAGNOSIS — I200 Unstable angina: Secondary | ICD-10-CM | POA: Diagnosis not present

## 2017-09-17 DIAGNOSIS — I2510 Atherosclerotic heart disease of native coronary artery without angina pectoris: Secondary | ICD-10-CM

## 2017-09-17 DIAGNOSIS — Z955 Presence of coronary angioplasty implant and graft: Secondary | ICD-10-CM | POA: Diagnosis not present

## 2017-09-17 DIAGNOSIS — R079 Chest pain, unspecified: Secondary | ICD-10-CM | POA: Diagnosis not present

## 2017-09-17 DIAGNOSIS — Z01810 Encounter for preprocedural cardiovascular examination: Secondary | ICD-10-CM | POA: Diagnosis not present

## 2017-09-17 DIAGNOSIS — K7689 Other specified diseases of liver: Secondary | ICD-10-CM

## 2017-09-17 DIAGNOSIS — I5030 Unspecified diastolic (congestive) heart failure: Secondary | ICD-10-CM | POA: Diagnosis not present

## 2017-09-17 DIAGNOSIS — R911 Solitary pulmonary nodule: Secondary | ICD-10-CM

## 2017-09-17 LAB — COMPREHENSIVE METABOLIC PANEL
ALT: 16 U/L (ref 0–44)
AST: 20 U/L (ref 15–41)
Albumin: 3.5 g/dL (ref 3.5–5.0)
Alkaline Phosphatase: 70 U/L (ref 38–126)
Anion gap: 7 (ref 5–15)
BUN: <5 mg/dL — ABNORMAL LOW (ref 8–23)
CO2: 29 mmol/L (ref 22–32)
Calcium: 9.3 mg/dL (ref 8.9–10.3)
Chloride: 100 mmol/L (ref 98–111)
Creatinine, Ser: 0.95 mg/dL (ref 0.44–1.00)
GFR calc Af Amer: >60 mL/min (ref >60)
GFR calc non Af Amer: 57 mL/min — ABNORMAL LOW (ref >60)
Glucose, Bld: 94 mg/dL (ref 70–99)
Potassium: 3.4 mmol/L — ABNORMAL LOW (ref 3.5–5.1)
Sodium: 136 mmol/L (ref 135–145)
Total Bilirubin: 1 mg/dL (ref 0.3–1.2)
Total Protein: 6.4 g/dL — ABNORMAL LOW (ref 6.5–8.1)

## 2017-09-17 LAB — BLOOD GAS, ARTERIAL
Acid-Base Excess: 1.6 mmol/L (ref 0.0–2.0)
Bicarbonate: 25.4 mmol/L (ref 20.0–28.0)
Drawn by: 519031
FIO2: 21
O2 Saturation: 94 %
Patient temperature: 98.6
pCO2 arterial: 38.8 mmHg (ref 32.0–48.0)
pH, Arterial: 7.432 (ref 7.350–7.450)
pO2, Arterial: 72.2 mmHg — ABNORMAL LOW (ref 83.0–108.0)

## 2017-09-17 LAB — HEMOGLOBIN A1C
Hgb A1c MFr Bld: 5.8 % — ABNORMAL HIGH (ref 4.8–5.6)
Mean Plasma Glucose: 119.76 mg/dL

## 2017-09-17 LAB — SURGICAL PCR SCREEN
MRSA, PCR: NEGATIVE
Staphylococcus aureus: NEGATIVE

## 2017-09-17 LAB — MAGNESIUM: Magnesium: 1.8 mg/dL (ref 1.7–2.4)

## 2017-09-17 MED ORDER — IOPAMIDOL (ISOVUE-300) INJECTION 61%
INTRAVENOUS | Status: AC
  Filled 2017-09-17: qty 100

## 2017-09-17 MED ORDER — NITROGLYCERIN 0.4 MG SL SUBL: 0.4000 mg | SUBLINGUAL_TABLET | SUBLINGUAL | 3 refills | Status: DC | PRN

## 2017-09-17 MED ORDER — POTASSIUM CHLORIDE CRYS ER 20 MEQ PO TBCR
40.0000 meq | EXTENDED_RELEASE_TABLET | Freq: Once | ORAL | Status: AC
  Administered 2017-09-17: 40 meq via ORAL
  Filled 2017-09-17: qty 2

## 2017-09-17 MED ORDER — ASPIRIN EC 81 MG PO TBEC
81.0000 mg | DELAYED_RELEASE_TABLET | Freq: Every day | ORAL | Status: DC
  Administered 2017-09-17: 81 mg via ORAL
  Filled 2017-09-17: qty 1

## 2017-09-17 MED ORDER — MAGNESIUM OXIDE 400 (241.3 MG) MG PO TABS
200.0000 mg | ORAL_TABLET | Freq: Every day | ORAL | Status: DC
  Administered 2017-09-17: 200 mg via ORAL
  Filled 2017-09-17: qty 1

## 2017-09-17 MED ORDER — MAGNESIUM OXIDE 400 (241.3 MG) MG PO TABS: 200.0000 mg | ORAL_TABLET | Freq: Every day | ORAL | 1 refills | Status: DC

## 2017-09-17 MED ORDER — IOPAMIDOL (ISOVUE-300) INJECTION 61%
100.0000 mL | Freq: Once | INTRAVENOUS | Status: AC | PRN
  Administered 2017-09-17: 75 mL via INTRAVENOUS

## 2017-09-17 MED ORDER — ATORVASTATIN CALCIUM 20 MG PO TABS: 20.0000 mg | ORAL_TABLET | Freq: Every day | ORAL | 11 refills | Status: DC

## 2017-09-17 MED ORDER — ASPIRIN 81 MG PO TBEC: 81.0000 mg | DELAYED_RELEASE_TABLET | Freq: Every day | ORAL | Status: DC

## 2017-09-17 NOTE — Progress Notes (Signed)
Patient back from CXR and Vascular dopplers.  12 Lead EKG and surgical PCR completed.

## 2017-09-17 NOTE — Discharge Instructions (Signed)
NO strenuous activity. If you have chest pain, return to the hospital ER.  PLEASE REMEMBER TO BRING ALL OF YOUR MEDICATIONS TO EACH OF YOUR FOLLOW-UP OFFICE VISITS.  PLEASE ATTEND ALL SCHEDULED FOLLOW-UP APPOINTMENTS.   Activity: Increase activity slowly as tolerated. You may shower, but no soaking baths (or swimming) for 1 week. No driving for 2 days. No lifting over 5 lbs for 1 week. No sexual activity for 1 week.   You May Return to Work: in 1 week (if applicable)  Wound Care: You may wash cath site gently with soap and water. Keep cath site clean and dry. If you notice pain, swelling, bleeding or pus at your cath site, please call [phone].    Cardiac Cath Site Care Refer to this sheet in the next few weeks. These instructions provide you with information on caring for yourself after your procedure. Your caregiver may also give you more specific instructions. Your treatment has been planned according to current medical practices, but problems sometimes occur. Call your caregiver if you have any problems or questions after your procedure. HOME CARE INSTRUCTIONS  You may shower 24 hours after the procedure. Remove the bandage (dressing) and gently wash the site with plain soap and water. Gently pat the site dry.   Do not apply powder or lotion to the site.   Do not sit in a bathtub, swimming pool, or whirlpool for 5 to 7 days.   No bending, squatting, or lifting anything over 10 pounds (4.5 kg) as directed by your caregiver.   Inspect the site at least twice daily.   Do not drive home if you are discharged the same day of the procedure. Have someone else drive you.   You may drive 24 hours after the procedure unless otherwise instructed by your caregiver.  What to expect:  Any bruising will usually fade within 1 to 2 weeks.   Blood that collects in the tissue (hematoma) may be painful to the touch. It should usually decrease in size and tenderness within 1 to 2 weeks.  SEEK  IMMEDIATE MEDICAL CARE IF:  You have unusual pain at the site or down the affected limb.   You have redness, warmth, swelling, or pain at the site.   You have drainage (other than a small amount of blood on the dressing).   You have chills.   You have a fever or persistent symptoms for more than 72 hours.   You have a fever and your symptoms suddenly get worse.   Your leg becomes pale, cool, tingly, or numb.   You have heavy bleeding from the site. Hold pressure on the site.  Document Released: 02/07/2010 Document Revised: 12/25/2010 Document Reviewed: 02/07/2010 [facility] Patient Information 2012 [place].

## 2017-09-17 NOTE — Discharge Summary (Addendum)
Discharge Summary    Patient ID: Rhonda Hughes,  MRN: [id number], DOB/AGE: 1940-05-22 77 y.o.  Admit date: 09/16/2017 Discharge date: 09/17/2017  Primary Care Provider: System, Provider Not In Primary Cardiologist: Dr. Angelena Form  Discharge Diagnoses    Principal Problem:   Unstable angina [facility]) Active Problems:   HTN (hypertension)   CAD (coronary artery disease), native coronary artery   Chest pain with moderate risk for cardiac etiology   Lung nodule   Liver nodule   Allergies Allergies  Allergen Reactions  . Adhesive [Tape] Other (See Comments)    Turns skin red  . Sulfa Antibiotics Other (See Comments)    Childhood allergy    Diagnostic Studies/Procedures    ECHO: 09/16/2017 - Left ventricle: The cavity size was normal. Systolic function was   vigorous. The estimated ejection fraction was in the range of 65%   to 70%. Wall motion was normal; there were no regional wall   motion abnormalities. Doppler parameters are consistent with   abnormal left ventricular relaxation (grade 1 diastolic   dysfunction). There was no evidence of elevated ventricular   filling pressure by Doppler parameters. - Aortic valve: There was no regurgitation. - Aortic root: The aortic root was normal in size. - Mitral valve: Calcified annulus. Mildly thickened leaflets .   There was trivial regurgitation. - Right ventricle: The cavity size was normal. Wall thickness was   normal. Systolic function was normal. - Tricuspid valve: There was mild regurgitation. - Pulmonary arteries: Systolic pressure was within the normal   range. - Inferior vena cava: The vessel was normal in size. - Pericardium, extracardiac: There was no pericardial effusion.  CARDIAC CATH: 09/16/2017  LM lesion is 30% stenosed.  Prox LAD lesion is 30% stenosed.  Mid LAD to Dist LAD lesion is 10% stenosed.  Ost LAD lesion is 90% stenosed.  Ost Ramus lesion is 50% stenosed.  Ost Cx to Prox Cx lesion is 80%  stenosed.  Prox Cx to Mid Cx lesion is 20% stenosed.  Ost RCA lesion is 30% stenosed.  Mid RCA-1 lesion is 20% stenosed.  Mid RCA-2 lesion is 65% stenosed.  Mid LM to Ost LAD lesion is 80% stenosed.  Previously placed 2nd Mrg stent (unknown type) is widely patent.  Previously placed Prox LAD to Mid LAD stent (unknown type) is widely patent.  Ost 1st Mrg lesion is 80% stenosed.  The left ventricular systolic function is normal.  LV end diastolic pressure is normal.  The left ventricular ejection fraction is 55-65% by visual estimate.  There is no mitral valve regurgitation.   1. Severe distal left main artery stenosis with hazy appearance involving the ostia of the LAD, intermediate and Circumflex arteries.  2. Severe ostial LAD stenosis. (Best seen in the LAO 43/CAU 18 view). Patent mid LAD stents.  3. Severe ostial intermediate branch stenosis 4. Severe ostial Circumflex stenosis (best seen in the LAO 49/CAU 8 view). Patent obtuse marginal stent 5. Moderately severe stenosis in the mid RCA 6. Normal LV systolic function  Recommendations: Her distal left main is hazy with severe stenosis affecting the LAD, Circumflex and large intermediate branch. She also has a moderately severe RCA stenosis. Her anatomy is not favorable for PCI/stenting. I will admit her to telemetry given her recent unstable symptoms. Will consult CT surgery to discuss CABG. She is a good candidate for CABG with normal renal function and normal LV systolic function. She has been on Plavix so this will be held  today to allow for washout prior to CABG.   _____________   History of Present Illness     77 yo female with history of CAD, HTN, HLD, anxiety, Barrett's esophagus who is here today for cardiac follow up. Cardiac cath in 2013 showed a moderate 70% stenosis in the first obtuse marginal branch and a 90% stenosis in the mid LAD. FFR of the obtuse marginal branch was negative. Overlapping drug eluting  stents were placed in the mid LAD.  Repeat cath February 2014 in setting of dyspnea and a drug eluting stent was placed in the OM branch. I saw her on 11/30/12 and she had c/o fatigue and SOB. Stress myoview without ischemia on images but she did have EKG changes suggestive of ischemia during exercise. Cardiac cath 12/14/12 with patent stents, moderate disease. IVUS of left main and proximal LAD confirmed mild plaque disease. Repeat cath April 2016 with stable CAD. She was admitted to [facility] 04/25/15 with dyspnea, chest pain with moderate exertion, fatigue. Troponin negative x 3. No ischemic EKG changes. Nuclear stress test 05/03/15 was low risk with LVEF=50%, possible inferior wall attenuation defect, no ischemia. I saw her in April 2017 and she continued to have dyspnea and chest pressure. I arranged a cardiac cath but she cancelled. I saw her back in the office 06/14/15 and she c/o continued dyspnea and chest pressure. Cardiac cath 06/21/15 with moderate non-obstructive CAD, normal filling pressures.   Office visit 09/13/2017, patient complaining of chest heaviness, symptoms concerning for angina.  Cardiac catheterization scheduled, she came to the hospital for the procedure on 8/29.  Hospital Course     Consultants: Dr. Darcey Nora  Cardiac catheterization results are above.  She has significant LAD and left main disease, a surgical consult was called.  Plavix was held to allow for Plavix washout.  Because she needs Plavix washout, she cannot have the surgery until next week.  The situation was reviewed by Dr. Angelena Form by phone.  Although he would prefer her to stay in the hospital, he states that it is acceptable for her to be discharged home and await surgery as an outpatient, as long as she understands the risks and accepts them.  On 8/30, she was seen by Dr. Ellyn Hack all data were reviewed.  Ms. Clavin is aware of the risks of discharge, and accepts them.  She states that she feels she can  keep from having chest pain as long as she minimizes her activity level, which she is willing to do.  She states her husband will take care of things for her and she will not have to do anything.  She is not having chest pain or shortness of breath at rest.  She was mildly hypokalemic, this was supplemented.  Since she is now off the Plavix, we will add aspirin 81 mg daily.  Telemetry showed frequent PVCs and some episodes of bigeminy.  She was asymptomatic with these.  However, her magnesium was slightly low at 1.8 and will be supplemented to keep it closer to 2.   As part of her evaluation, she had a chest x-ray.  This shows a nodule concerning for lung cancer.  CT of the chest showed 2 cm left upper lobe lung nodule concerning for bronchogenic carcinoma.  She also has all 1.1 cm central liver nodule as well.  No associated mediastinal lymphadenopathy.  CT imaging has been reviewed by Dr. Prescott Gum who felt patient can be discharged and to return for outpatient CABG after  Plavix washout.  The plan is to treat new left upper lobe nodule later after she recover from bypass surgery.  Discharge note was originally written by Rosaria Ferries, PA-C, this has been reviewed and adjustment made based on new findings on the chest CT. Dr. Lucianne Lei Trigt's note did not mention workup for liver nodule, this will need to be followed _____________  Discharge Vitals Blood pressure (!) 147/66, pulse 63, temperature 98.2 F (36.8 C), temperature source Oral, resp. rate 18, height 5' 1.5" (1.562 m), weight 53.8 kg, SpO2 97 %.  Filed Weights   09/16/17 0706 09/16/17 0927 09/17/17 0504  Weight: 56.2 kg 54.9 kg 53.8 kg  General: Well developed, well nourished, female in no acute distress Head: Eyes PERRLA, No xanthomas.   Normocephalic and atraumatic Lungs: Clear bilaterally to auscultation. Heart: HRRR S1 S2, without MRG.  Pulses are 2+ & equal.  No JVD. Abdomen: Bowel sounds are present, abdomen soft and non-tender  without masses or  hernias noted. Msk: Normal strength and tone for age. Extremities: No clubbing, cyanosis or edema.  Right radial cath site with no ecchymosis, or hematoma.  Distal pulses are intact Skin:  No rashes or lesions noted. Neuro: Alert and oriented X 3. Psych:  Good affect, responds appropriately   Labs & Radiologic Studies    CBC CBC Latest Ref Rng & Units 09/13/2017 05/16/2017 06/14/2015  WBC 3.4 - 10.8 x10E3/uL 10.9(H) 10.9(H) 10.0  Hemoglobin 11.1 - 15.9 g/dL 12.9 11.3(L) 13.2  Hematocrit 34.0 - 46.6 % 37.5 34.7(L) 39.0  Platelets 150 - 450 x10E3/uL 312 303 326     Basic Metabolic Panel Recent Labs    09/17/17 0409  NA 136  K 3.4*  CL 100  CO2 29  GLUCOSE 94  BUN <5*  CREATININE 0.95  CALCIUM 9.3  MG 1.8   Liver Function Tests Recent Labs    09/17/17 0409  AST 20  ALT 16  ALKPHOS 70  BILITOT 1.0  PROT 6.4*  ALBUMIN 3.5   Hemoglobin A1C Lab Results  Component Value Date   HGBA1C 5.8 (H) 09/17/2017   Magnesium  Date Value Ref Range Status  09/17/2017 1.8 1.7 - 2.4 mg/dL Final    Comment:    Performed at [facility], [place] [address], [place], [place] [address]    _____________  Dg Chest 2 View  Result Date: 09/17/2017 CLINICAL DATA:  Chest pain.  Shortness of breath. EXAM: CHEST - 2 VIEW COMPARISON:  04/24/2015. FINDINGS: Mediastinum hilar structures normal. Lungs are clear. No pleural effusion or pneumothorax. Prominent 2 cm nodular density noted over the left upper lung. Malignancy including primary lung cancer could present this fashion. CT scratched it contrast-enhanced chest CT suggested for further evaluation. No pleural effusion. No pneumothorax. IMPRESSION: 2 cm nodular opacity noted over the left upper lung. This is suspicious for tumor such as a primary lung cancer. IV contrast-enhanced chest CT is suggested for further evaluation. These results will be called to the ordering clinician or representative by the Radiologist  Assistant, and communication documented in the PACS or zVision Dashboard. Electronically Signed   By: Marcello Moores  Register   On: 09/17/2017 12:46   Ct Chest W Contrast  Result Date: 09/17/2017 CLINICAL DATA:  77 year old female with 2 centimeter apparent lung nodule on chest radiographs today performed for chest pain and shortness of breath. EXAM: CT CHEST WITH CONTRAST TECHNIQUE: Multidetector CT imaging of the chest was performed during intravenous contrast administration. CONTRAST:  98mL ISOVUE-300 IOPAMIDOL (ISOVUE-300)  INJECTION 61% COMPARISON:  Chest radiographs 1111 hours today. CTA chest 10/21/2011. FINDINGS: Cardiovascular: Extensive calcified coronary artery atherosclerosis and/or stent(s). Calcified aortic atherosclerosis. The central pulmonary arteries appear patent. No cardiomegaly or pericardial effusion. Mediastinum/Nodes: Negative.  No lymphadenopathy. Lungs/Pleura: There is an oval, generally smooth but mildly lobulated left upper lobe pulmonary nodule encompassing 18 x 40 x 20 millimeters (AP by transverse by CC) as seen on series 5, image 56 and coronal image 60. This appears to be enhancing, and has partially engulfed a left upper lobe pulmonary artery branch as seen on coronal images 59 through 61 (arrows). This is new since 2013, and there is underlying chronic centrilobular emphysema. No other pulmonary nodule. The major airways are patent. No pleural effusion. Upper Abdomen: Small indeterminate oval 11 mm low-density area in the central liver which might also be new since 2013 has low to intermediate density. No other lesion in the visible liver. Negative visible gallbladder, spleen, pancreas, adrenal glands, kidneys, and proximal bowel in the upper abdomen. Diverticulosis at the splenic flexure of colon. Musculoskeletal: No acute or suspicious osseous lesion identified. IMPRESSION: 1. Chronic Emphysema (ICD10-J43.9) with confirmed 2 cm left upper lobe nodule, new since 2013 and highly  suspicious for bronchogenic carcinoma despite relatively smooth contours. Note that this lesion has partially engulfed a pulmonary artery branch as demonstrated on coronal images 59-61. 2. No associated mediastinal lymphadenopathy. There is a small 1.1 cm indeterminate low-density lesion in the central liver, also seemingly new since 2013. 3. Calcified coronary artery and Aortic Atherosclerosis (ICD10-I70.0). Electronically Signed   By: Genevie Ann M.D.   On: 09/17/2017 17:24   Disposition   Pt is being discharged home today in good condition.  Follow-up Plans & Appointments     Follow-up Information    Ivin Poot, MD Follow up on 09/21/2017.   Specialty:  Cardiothoracic Surgery Why:  8:00AM. Pre-admission testing Contact information: [place] [address] [phone]        Consuelo Pandy, PA-C Follow up on 10/19/2017.   Specialties:  Cardiology, Radiology Why:  3:00PM. Cardiology visit. Contact information: [address] [place] [address] [phone]            Discharge Medications   Allergies as of 09/17/2017      Reactions   Adhesive [tape] Other (See Comments)   Turns skin red   Sulfa Antibiotics Other (See Comments)   Childhood allergy      Medication List    STOP taking these medications   clopidogrel 75 MG tablet Commonly known as:  PLAVIX     TAKE these medications   ALPRAZolam 0.25 MG tablet Commonly known as:  XANAX Take 0.25 mg by mouth 2 (two) times daily as needed for anxiety.   aspirin 81 MG EC tablet Take 1 tablet (81 mg total) by mouth daily. Start taking on:  09/18/2017   atorvastatin 20 MG tablet Commonly known as:  LIPITOR Take 1 tablet (20 mg total) by mouth at bedtime. What changed:    medication strength  how much to take   diphenhydramine-acetaminophen 25-500 MG Tabs tablet Commonly known as:  TYLENOL PM Take 2 tablets by mouth at bedtime as needed (for sleep).   docusate  sodium 100 MG capsule Commonly known as:  COLACE Take 200 mg by mouth daily as needed for mild constipation.   isosorbide mononitrate 30 MG 24 hr tablet Commonly known as:  IMDUR TAKE 1 TABLET BY MOUTH  DAILY What changed:  when to take this   lisinopril-hydrochlorothiazide 20-12.5 MG tablet Commonly known as:  PRINZIDE,ZESTORETIC Take 1 tablet by mouth daily. Hold medication until follow-up with PCP What changed:    when to take this  additional instructions   magnesium oxide 400 (241.3 Mg) MG tablet Commonly known as:  MAG-OX Take 0.5 tablets (200 mg total) by mouth daily. Start taking on:  09/18/2017   memantine 10 MG tablet Commonly known as:  NAMENDA Take 10 mg by mouth 2 (two) times daily.   nitroGLYCERIN 0.4 MG SL tablet Commonly known as:  NITROSTAT Place 1 tablet (0.4 mg total) under the tongue every 5 (five) minutes as needed for chest pain.   pantoprazole 20 MG tablet Commonly known as:  PROTONIX Take 20 mg by mouth daily.   potassium chloride SA 20 MEQ tablet Commonly known as:  K-DUR,KLOR-CON Take 1 tablet (20 mEq total) by mouth daily.   REFRESH 1.4-0.6 % Soln Generic drug:  Polyvinyl Alcohol-Povidone PF Place 1-2 drops into both eyes 2 (two) times daily as needed (for eye irritation).   venlafaxine XR 37.5 MG 24 hr capsule Commonly known as:  EFFEXOR-XR Take 37.5 mg by mouth at bedtime.   zolpidem 10 MG tablet Commonly known as:  AMBIEN Take 10 mg by mouth at bedtime.          Outstanding Labs/Studies   None  Duration of Discharge Encounter   Greater than 30 minutes including physician time.  Signed, Almyra Deforest PA-C 09/17/2017, 7:55 PM

## 2017-09-17 NOTE — Progress Notes (Signed)
CARDIAC REHAB PHASE I   PRE:  Rate/Rhythm: 70 SR    BP: sitting 161/70    SaO2: 96 RA  MODE:  Ambulation: 470 ft   POST:  Rate/Rhythm: 87 SR    BP: sitting 183/75     SaO2: 96 RA  Pt able to ambulate hall independently at slow pace. VSS, no angina, SOB minimal. Reminded pt to take it easy at home, keep slow pace.  She practiced IS, 1250 mL. Set up preop video for pt and husband. [place], ACSM 09/17/2017 1:41 PM

## 2017-09-17 NOTE — Progress Notes (Signed)
1 Day Post-Op Procedure(s) (LRB): LEFT HEART CATH AND CORONARY ANGIOGRAPHY (N/A) Subjective: CT of chest reviewed and discussed with patient Will go ahead with CABG as outpatient 8-5 Patient can be discharged to go home now for plavix washout DC instructions regarding meds, activity and preparation for surgery reviewed with patient  Will plan on treatment of new LUL nodule later after she recovers from CABG for L main stenosis Objective: Vital signs in last 24 hours: Temp:  [97.9 F (36.6 C)-98.2 F (36.8 C)] 98.2 F (36.8 C) (08/30 1221) Pulse Rate:  [59-63] 63 (08/30 1221) Cardiac Rhythm: Normal sinus rhythm (08/30 1903) Resp:  [16-18] 18 (08/30 1221) BP: (147-161)/(66-77) 147/66 (08/30 1221) SpO2:  [97 %-99 %] 97 % (08/30 1221) Weight:  [53.8 kg] 53.8 kg (08/30 0504)  Hemodynamic parameters for last 24 hours:    Intake/Output from previous day: 08/29 0701 - 08/30 0700 In: 420 [P.O.:420] Out: 2050 [Urine:2050] Intake/Output this shift: No intake/output data recorded.       Exam    General- alert and comfortable    Neck- no JVD, no cervical adenopathy palpable, no carotid bruit   Lungs- clear without rales, wheezes   Cor- regular rate and rhythm, no murmur , gallop   Abdomen- soft, non-tender   Extremities - warm, non-tender, minimal edema   Neuro- oriented, appropriate, no focal weakness   Lab Results: No results for input(s): WBC, HGB, HCT, PLT in the last 72 hours. BMET:  Recent Labs    09/17/17 0409  NA 136  K 3.4*  CL 100  CO2 29  GLUCOSE 94  BUN <5*  CREATININE 0.95  CALCIUM 9.3    PT/INR: No results for input(s): LABPROT, INR in the last 72 hours. ABG    Component Value Date/Time   PHART 7.432 09/17/2017 1530   HCO3 25.4 09/17/2017 1530   TCO2 28 06/21/2015 1306   ACIDBASEDEF 1.0 06/21/2015 1259   O2SAT 94.0 09/17/2017 1530   CBG (last 3)  No results for input(s): GLUCAP in the last 72 hours.  Assessment/Plan: S/P Procedure(s)  (LRB): LEFT HEART CATH AND CORONARY ANGIOGRAPHY (N/A)  OK to DC home  today   LOS: 1 day    [name] 09/17/2017

## 2017-09-17 NOTE — Progress Notes (Signed)
I contacted CT dept to inquire on status of patient's Chest CT.  They stated she is on the list.  ABG was drawn.  Discharge this evening pending CT of chest completed. Patient and her husband updated on plan of care.  Staff from Dr. Lucianne Lei Trigt's office called and spoke with patient to give instructions on coming in next Tuesday for pre-op testing.

## 2017-09-17 NOTE — Progress Notes (Signed)
Result from CT back, showing 2cm LUL nodule concerning for bronchogenic carcinoma, this nodule is partially engulfing a pulmonary artery branch. Also noted a 1.1cm liver nodule as well. Paged cardiothoracic surgery and discussed with Dr. Ellyn Hack. If Dr. Prescott Gum does not respond today, we will keep her overnight. Need plan moving forward based on the new finding.   Hilbert Corrigan PA Pager: [phone]

## 2017-09-17 NOTE — Progress Notes (Signed)
Patient has refused 12 lead EKG and chest x-ray this morning Neta Mends RN 7:45 AM 09-17-2017

## 2017-09-17 NOTE — Progress Notes (Signed)
Patient refused surgical PCR screen and vas US dopplers for CABG pre-screen, stating Dr. Nils Pyle will be doing all pre-screens on outpatient basis.

## 2017-09-17 NOTE — Care Management Obs Status (Signed)
[place] NOTIFICATION   Patient Details  Name: Rhonda Hughes MRN: [id number] Date of Birth: 11-11-1940   Medicare Observation Status Notification Given:  Yes    Royston Bake, RN 09/17/2017, 1:14 PM

## 2017-09-17 NOTE — Progress Notes (Signed)
Called report that CXR showed 2cm opacity that could be indicative of lung cancer and was asked to contact MD with these abnormal results.  Text message sent to [facility], PA, with above information.

## 2017-09-17 NOTE — Progress Notes (Signed)
Abnormal CXR results of 2cm nodule in left upper lung reported to Ellwood Handler, Cardiothoracic Externder, and she stated she will call their office and give results to them and patient will receive treatment plan information from call from their office, re confirmation of CABG surgery date, given above results.

## 2017-09-17 NOTE — Progress Notes (Signed)
Dr. Lucianne Lei Trigt's nurse called and said Dr. Prescott Gum has ordered a CT of chest.  Patient updated on plan of care and Rosaria Ferries updated on plan of care and will come to discuss results with patient.

## 2017-09-17 NOTE — Progress Notes (Signed)
Pre cabg Doppler eval. Prelim:  Right Carotid:Velocities in the right ICA are consistent with a 1-39% stenosis.  Left Carotid: Velocities in the left ICA are consistent with a 1-39% stenosis.    Right ABI: Pedal artery waveforms within normal limits.  Left ABI: Pedal artery waveforms within normal limits.   Bilateral ABI:  Right Upper Extremity: Doppler waveforms remain within normal limits with right radial compression. Doppler waveforms remain within normal limits with right ulnar compression.  Left Upper Extremity: Doppler waveforms decrease >50% with left radial compression. Doppler waveforms remain within normal limits with left ulnar compression.    Landry Mellow, RDMS, RVT

## 2017-09-17 NOTE — Care Management CC44 (Signed)
Condition Code 44 Documentation Completed  Patient Details  Name: Rhonda Hughes MRN: [id number] Date of Birth: 02-Mar-1940   Condition Code 44 given:  Yes Patient signature on Condition Code 44 notice:  Yes Documentation of 2 MD's agreement:  Yes Code 44 added to claim:  Yes    Royston Bake, RN 09/17/2017, 1:14 PM

## 2017-09-17 NOTE — Progress Notes (Signed)
Pt discharged per MD order, discharge instructions given, advised to follow up with Dr. Nils Pyle office on sept 3, 2019. Pt discharged in stable condition.

## 2017-09-21 ENCOUNTER — Other Ambulatory Visit: Payer: Self-pay

## 2017-09-21 ENCOUNTER — Encounter (HOSPITAL_COMMUNITY)
Admit: 2017-09-21 | Discharge: 2017-09-21 | Disposition: A | Discharge status: Home / Self Care | Payer: Medicare Other | Source: Ambulatory Visit | Attending: Cardiothoracic Surgery | Admitting: Cardiothoracic Surgery

## 2017-09-21 ENCOUNTER — Encounter (HOSPITAL_COMMUNITY): Payer: Self-pay

## 2017-09-21 DIAGNOSIS — F419 Anxiety disorder, unspecified: Secondary | ICD-10-CM | POA: Insufficient documentation

## 2017-09-21 DIAGNOSIS — Z90710 Acquired absence of both cervix and uterus: Secondary | ICD-10-CM

## 2017-09-21 DIAGNOSIS — I2510 Atherosclerotic heart disease of native coronary artery without angina pectoris: Secondary | ICD-10-CM

## 2017-09-21 DIAGNOSIS — Z7902 Long term (current) use of antithrombotics/antiplatelets: Secondary | ICD-10-CM

## 2017-09-21 DIAGNOSIS — Z7982 Long term (current) use of aspirin: Secondary | ICD-10-CM | POA: Insufficient documentation

## 2017-09-21 DIAGNOSIS — K219 Gastro-esophageal reflux disease without esophagitis: Secondary | ICD-10-CM

## 2017-09-21 DIAGNOSIS — Z955 Presence of coronary angioplasty implant and graft: Secondary | ICD-10-CM

## 2017-09-21 DIAGNOSIS — Z01812 Encounter for preprocedural laboratory examination: Secondary | ICD-10-CM

## 2017-09-21 DIAGNOSIS — J449 Chronic obstructive pulmonary disease, unspecified: Secondary | ICD-10-CM | POA: Insufficient documentation

## 2017-09-21 DIAGNOSIS — R911 Solitary pulmonary nodule: Secondary | ICD-10-CM

## 2017-09-21 DIAGNOSIS — I10 Essential (primary) hypertension: Secondary | ICD-10-CM

## 2017-09-21 DIAGNOSIS — F329 Major depressive disorder, single episode, unspecified: Secondary | ICD-10-CM

## 2017-09-21 DIAGNOSIS — Z01818 Encounter for other preprocedural examination: Secondary | ICD-10-CM

## 2017-09-21 DIAGNOSIS — Z0183 Encounter for blood typing: Secondary | ICD-10-CM

## 2017-09-21 DIAGNOSIS — Z79899 Other long term (current) drug therapy: Secondary | ICD-10-CM

## 2017-09-21 DIAGNOSIS — Z87891 Personal history of nicotine dependence: Secondary | ICD-10-CM

## 2017-09-21 DIAGNOSIS — E785 Hyperlipidemia, unspecified: Secondary | ICD-10-CM | POA: Insufficient documentation

## 2017-09-21 HISTORY — DX: Family history of other specified conditions: Z84.89

## 2017-09-21 LAB — PROTIME-INR
INR: 0.96
Prothrombin Time: 12.7 seconds (ref 11.4–15.2)

## 2017-09-21 LAB — URINALYSIS, ROUTINE W REFLEX MICROSCOPIC
Bacteria, UA: NONE SEEN
Bilirubin Urine: NEGATIVE
Glucose, UA: NEGATIVE mg/dL
Ketones, ur: NEGATIVE mg/dL
Leukocytes, UA: NEGATIVE
Nitrite: NEGATIVE
Protein, ur: NEGATIVE mg/dL
Specific Gravity, Urine: 1.015 (ref 1.005–1.030)
pH: 5 (ref 5.0–8.0)

## 2017-09-21 LAB — CBC
HCT: 41.9 % (ref 36.0–46.0)
Hemoglobin: 13.7 g/dL (ref 12.0–15.0)
MCH: 32.2 pg (ref 26.0–34.0)
MCHC: 32.7 g/dL (ref 30.0–36.0)
MCV: 98.6 fL (ref 78.0–100.0)
Platelets: 316 10*3/uL (ref 150–400)
RBC: 4.25 MIL/uL (ref 3.87–5.11)
RDW: 12.3 % (ref 11.5–15.5)
WBC: 10.4 10*3/uL (ref 4.0–10.5)

## 2017-09-21 LAB — COMPREHENSIVE METABOLIC PANEL
ALT: 19 U/L (ref 0–44)
AST: 23 U/L (ref 15–41)
Albumin: 4.1 g/dL (ref 3.5–5.0)
Alkaline Phosphatase: 77 U/L (ref 38–126)
Anion gap: 8 (ref 5–15)
BUN: 5 mg/dL — ABNORMAL LOW (ref 8–23)
CO2: 26 mmol/L (ref 22–32)
Calcium: 9.8 mg/dL (ref 8.9–10.3)
Chloride: 99 mmol/L (ref 98–111)
Creatinine, Ser: 0.9 mg/dL (ref 0.44–1.00)
GFR calc Af Amer: >60 mL/min (ref >60)
GFR calc non Af Amer: >60 mL/min (ref >60)
Glucose, Bld: 97 mg/dL (ref 70–99)
Potassium: 4.2 mmol/L (ref 3.5–5.1)
Sodium: 133 mmol/L — ABNORMAL LOW (ref 135–145)
Total Bilirubin: 0.8 mg/dL (ref 0.3–1.2)
Total Protein: 7.5 g/dL (ref 6.5–8.1)

## 2017-09-21 LAB — ABO/RH: ABO/RH(D): O NEG

## 2017-09-21 LAB — APTT: aPTT: 36 seconds (ref 24–36)

## 2017-09-21 NOTE — Progress Notes (Signed)
PCP: Etter Sjogren Cardiologist: Dr. Angelena Form  EKG: 09/17/2017 CXR: 09/17/2017 ECHO: 2019 Stress Test: 2017 Cardiac Cath: 09/17/2017  Partial labs needed for cardiac surgery already drawn on 09/17/2017 for heart cath. Remaining labs needed completed at PAT appt.   Patient denies shortness of breath, fever, cough, and chest pain at PAT appointment.  Patient verbalized understanding of instructions provided today at the PAT appointment.  Patient asked to review instructions at home and day of surgery.

## 2017-09-21 NOTE — Progress Notes (Signed)
Anesthesia Chart Review:  Case:  [id number] Date/Time:  09/24/17 0715   Procedures:      CORONARY ARTERY BYPASS GRAFTING (CABG) (N/A Chest)     TRANSESOPHAGEAL ECHOCARDIOGRAM (TEE) (N/A )   Anesthesia type:  General   Pre-op diagnosis:  CAD   Location:  [facility] OR ROOM 14 / [place] [facility]   Surgeon:  Ivin Poot, MD      DISCUSSION: Patient is a 77 year old female scheduled for the above procedure. (It appears surgery was moved to 09/24/17 from 09/23/17 since she had her PAT appointment.)  History includes former smoker (quit '09), HLD, HTN, CAD (overlapping DES mid LAD 10/16/11; DES OM1 03/07/12; 90% ostial LAD 09/16/17), COPD, GERD, AKI 05/16/17. Recent CXR/chest CT showed a LUL lung nodule concerning for primary bronchogenic carcinoma. According to her 09/17/17 discharge summary, Dr. Prescott Gum plans for patient to proceed with CABG (scheduled for 09/24/17 to allow for Plavix washout) and will further evaluate/treat the lung nodule after she recovers from CABG.  - Admitted 09/16/17-09/17/17 for cardiac cath for evaluation of unstable angina. CABG recommended based on results. Discharged home while waiting for Plavix washout.    Patient lives in [place], [place].  Anticipate that she can proceed as planned.   VS: BP (!) 125/57   Pulse 67   Temp 36.5 C   Resp 18   Ht 5' 1.5" (1.562 m)   Wt 55.8 kg   SpO2 98%   BMI 22.86 kg/m   PROVIDERS: Etter Sjogren, NP is PCP Lauree Chandler, MD is cardiologist.   LABS: Labs reviewed: Acceptable for surgery. A1c 5.8 on 09/17/17. (all labs ordered are listed, but only abnormal results are displayed)  Labs Reviewed  COMPREHENSIVE METABOLIC PANEL - Abnormal; Notable for the following components:      Result Value   Sodium 133 (*)    BUN 5 (*)    All other components within normal limits  URINALYSIS, ROUTINE W REFLEX MICROSCOPIC - Abnormal; Notable for the following components:   Hgb urine dipstick MODERATE (*)    All other components within normal limits   APTT  CBC  PROTIME-INR  TYPE AND SCREEN  ABO/RH   Spirometry 09/16/17: FVC 2.67 (106%), FEV1 1.63 (86%).   IMAGES: CT chest 09/17/17: IMPRESSION: 1. Chronic Emphysema (ICD10-J43.9) with confirmed 2 cm left upper lobe nodule, new since 2013 and highly suspicious for bronchogenic carcinoma despite relatively smooth contours. Note that this lesion has partially engulfed a pulmonary artery branch as demonstrated on coronal images 59-61. 2. No associated mediastinal lymphadenopathy. There is a small 1.1 cm indeterminate low-density lesion in the central liver, also seemingly new since 2013. 3. Calcified coronary artery and Aortic Atherosclerosis (ICD10-I70.0).  CXR 09/17/17: IMPRESSION: 2 cm nodular opacity noted over the left upper lung. This is suspicious for tumor such as a primary lung cancer. IV contrast-enhanced chest CT is suggested for further evaluation.   EKG: 09/17/17: NSR.   CV: Cardiac cath 09/16/17:  LM lesion is 30% stenosed.  Prox LAD lesion is 30% stenosed.  Mid LAD to Dist LAD lesion is 10% stenosed.  Ost LAD lesion is 90% stenosed.  Ost Ramus lesion is 50% stenosed.  Ost Cx to Prox Cx lesion is 80% stenosed.  Prox Cx to Mid Cx lesion is 20% stenosed.  Ost RCA lesion is 30% stenosed.  Mid RCA-1 lesion is 20% stenosed.  Mid RCA-2 lesion is 65% stenosed.  Mid LM to Ost LAD lesion is 80% stenosed.  Previously placed 2nd  Mrg stent (unknown type) is widely patent.  Previously placed Prox LAD to Mid LAD stent (unknown type) is widely patent.  Ost 1st Mrg lesion is 80% stenosed.  The left ventricular systolic function is normal.  LV end diastolic pressure is normal.  The left ventricular ejection fraction is 55-65% by visual estimate.  There is no mitral valve regurgitation. 1. Severe distal left main artery stenosis with hazy appearance involving the ostia of the LAD, intermediate and Circumflex arteries.  2. Severe ostial LAD stenosis.  (Best seen in the LAO 43/CAU 18 view). Patent mid LAD stents.  3. Severe ostial intermediate branch stenosis 4. Severe ostial Circumflex stenosis (best seen in the LAO 49/CAU 8 view). Patent obtuse marginal stent 5. Moderately severe stenosis in the mid RCA 6. Normal LV systolic function Recommendations: Her distal left main is hazy with severe stenosis affecting the LAD, Circumflex and large intermediate branch. She also has a moderately severe RCA stenosis. Her anatomy is not favorable for PCI/stenting. Will consult CT surgery to discuss CABG.   Echo 09/16/17: Study Conclusions - Left ventricle: The cavity size was normal. Systolic function was   vigorous. The estimated ejection fraction was in the range of 65%   to 70%. Wall motion was normal; there were no regional wall   motion abnormalities. Doppler parameters are consistent with   abnormal left ventricular relaxation (grade 1 diastolic   dysfunction). There was no evidence of elevated ventricular   filling pressure by Doppler parameters. - Aortic valve: There was no regurgitation. - Aortic root: The aortic root was normal in size. - Mitral valve: Calcified annulus. Mildly thickened leaflets .   There was trivial regurgitation. - Right ventricle: The cavity size was normal. Wall thickness was   normal. Systolic function was normal. - Tricuspid valve: There was mild regurgitation. - Pulmonary arteries: Systolic pressure was within the normal   range. - Inferior vena cava: The vessel was normal in size. - Pericardium, extracardiac: There was no pericardial effusion.  Carotid U/S 09/17/17: Final Interpretation: Right Carotid: Velocities in the right ICA are consistent with a 1-39% stenosis. Left Carotid: Velocities in the left ICA are consistent with a 1-39% stenosis.   Past Medical History:  Diagnosis Date  . AKI (acute kidney injury) ([place]) 05/16/2017  . Anxiety   . Barrett's esophagus without dysplasia   . CAD (coronary  artery disease)    a. LHC 10/16/11: dLM 20%, mLAD 90%, pOM1 30%, mOM1 70% (FFR not hemodynamically significant), prox and mid RCA 30%, EF 65%;  b. PCI 10/16/11: Promus DES x 2 to mLAD  . COPD (chronic obstructive pulmonary disease) ([place])   . Depression   . Diverticulosis   . Essential hypertension   . Family history of adverse reaction to anesthesia    mother had "breathing problems"  . GERD (gastroesophageal reflux disease)   . Hyperlipidemia   . Rectocele     Past Surgical History:  Procedure Laterality Date  . ABDOMINAL HYSTERECTOMY    . CARDIAC CATHETERIZATION    . CARDIAC CATHETERIZATION N/A 06/21/2015   Procedure: Right/Left Heart Cath and Coronary Angiography;  Surgeon: [name], MD;  Location: [facility] LAB;  Service: Cardiovascular;  Laterality: N/A;  . CARDIAC CATHETERIZATION  09/16/2017  . COLONOSCOPY  01/2008   Multiple diverticula in desc and sigm colon  . ESOPHAGOGASTRODUODENOSCOPY  01/2008   Barrett's esophagus, gastritis, no h.pylori, due follow-up 01/2011  . ESOPHAGOGASTRODUODENOSCOPY  04/06/2011   Procedure: ESOPHAGOGASTRODUODENOSCOPY (EGD);  Surgeon: Carlyon Prows  Rexene Edison, MD;  Location: [facility];  Service: Endoscopy;  Laterality: N/A;  8:30  . FLEXIBLE SIGMOIDOSCOPY  08/11/2010 RECTAL PAIN/PRESSURE   SML IH, TICS  . FRACTIONAL FLOW RESERVE WIRE N/A 10/16/2011   Procedure: FRACTIONAL FLOW RESERVE WIRE;  Surgeon: [name], MD;  Location: [facility] CATH LAB;  Service: Cardiovascular;  Laterality: N/A;  . LEFT HEART CATH AND CORONARY ANGIOGRAPHY N/A 09/16/2017   Procedure: LEFT HEART CATH AND CORONARY ANGIOGRAPHY;  Surgeon: [name], MD;  Location: [facility] CV LAB;  Service: Cardiovascular;  Laterality: N/A;  . LEFT HEART CATHETERIZATION WITH CORONARY ANGIOGRAM N/A 03/07/2012   Procedure: LEFT HEART CATHETERIZATION WITH CORONARY ANGIOGRAM;  Surgeon: [name], MD;  Location: [facility] CATH LAB;  Service: Cardiovascular;  Laterality:  N/A;  . LEFT HEART CATHETERIZATION WITH CORONARY ANGIOGRAM N/A 12/14/2012   Procedure: LEFT HEART CATHETERIZATION WITH CORONARY ANGIOGRAM;  Surgeon: [name], MD;  Location: [facility] CATH LAB;  Service: Cardiovascular;  Laterality: N/A;  . LEFT HEART CATHETERIZATION WITH CORONARY ANGIOGRAM N/A 05/07/2014   Procedure: LEFT HEART CATHETERIZATION WITH CORONARY ANGIOGRAM;  Surgeon: [name], MD;  Location: [facility] CATH LAB;  Service: Cardiovascular;  Laterality: N/A;  . PARTIAL HYSTERECTOMY  1980s    MEDICATIONS: . clopidogrel (PLAVIX) 75 MG tablet  . ALPRAZolam (XANAX) 0.25 MG tablet  . aspirin EC 81 MG EC tablet  . atorvastatin (LIPITOR) 20 MG tablet  . diphenhydramine-acetaminophen (TYLENOL PM) 25-500 MG TABS  . docusate sodium (COLACE) 100 MG capsule  . isosorbide mononitrate (IMDUR) 30 MG 24 hr tablet  . lisinopril-hydrochlorothiazide (PRINZIDE,ZESTORETIC) 20-12.5 MG tablet  . magnesium oxide (MAG-OX) 400 (241.3 Mg) MG tablet  . memantine (NAMENDA) 10 MG tablet  . nitroGLYCERIN (NITROSTAT) 0.4 MG SL tablet  . pantoprazole (PROTONIX) 20 MG tablet  . Polyvinyl Alcohol-Povidone PF (REFRESH) 1.4-0.6 % SOLN  . potassium chloride SA (K-DUR,KLOR-CON) 20 MEQ tablet  . venlafaxine XR (EFFEXOR-XR) 37.5 MG 24 hr capsule  . zolpidem (AMBIEN) 10 MG tablet   No current facility-administered medications for this encounter.     [facility] Short Stay Center/Anesthesiology Phone [phone] 09/21/2017 5:04 PM

## 2017-09-21 NOTE — Pre-Procedure Instructions (Addendum)
Rhonda Hughes  09/21/2017      Your procedure is scheduled on September 23, 2017.  Report to [facility] Admitting at 05:30 A.M.  Call this number if you have problems the morning of surgery:  [phone]   Remember:  Do not eat or drink after midnight.     Take these medicines the morning of surgery with A SIP OF WATER : Aprazolam (Xanax) Memantine (Namenda) Pantoprazole (Protonix) Eye Drops  STOP your Plavix and Aspirin 7 days prior surgery per your Doctor's instructions.   7 days prior to surgery STOP taking any Aspirin (unless otherwise instructed by your surgeon), Aleve, Naproxen, Ibuprofen, Motrin, Advil, Goody's, BC's, all herbal medications, fish oil, and all vitamins.    Do not wear jewelry, make-up or nail polish.  Do not wear lotions, powders, or perfumes, or deodorant.  Do not shave 48 hours prior to surgery.   Do not bring valuables to the hospital.  [facility] is not responsible for any belongings or valuables.  Contacts, dentures or bridgework may not be worn into surgery.  Leave your suitcase in the car.  After surgery it may be brought to your room.  For patients admitted to the hospital, discharge time will be determined by your treatment team.  Patients discharged the day of surgery will not be allowed to drive home.   Special instructions:   [facility]- Preparing For Surgery  Before surgery, you can play an important role. Because skin is not sterile, your skin needs to be as free of germs as possible. You can reduce the number of germs on your skin by washing with CHG (chlorahexidine gluconate) Soap before surgery.  CHG is an antiseptic cleaner which kills germs and bonds with the skin to continue killing germs even after washing.    Oral Hygiene is also important to reduce your risk of infection.  Remember - BRUSH YOUR TEETH THE MORNING OF SURGERY WITH YOUR REGULAR TOOTHPASTE  Please do not use if you have an allergy to CHG or  antibacterial soaps. If your skin becomes reddened/irritated stop using the CHG.  Do not shave (including legs and underarms) for at least 48 hours prior to first CHG shower. It is OK to shave your face.  Please follow these instructions carefully.   1. Shower the NIGHT BEFORE SURGERY and the MORNING OF SURGERY with CHG.   2. If you chose to wash your hair, wash your hair first as usual with your normal shampoo.  3. After you shampoo, rinse your hair and body thoroughly to remove the shampoo.  4. Use CHG as you would any other liquid soap. You can apply CHG directly to the skin and wash gently with a scrungie or a clean washcloth.   5. Apply the CHG Soap to your body ONLY FROM THE NECK DOWN.  Do not use on open wounds or open sores. Avoid contact with your eyes, ears, mouth and genitals (private parts). Wash Face and genitals (private parts)  with your normal soap.  6. Wash thoroughly, paying special attention to the area where your surgery will be performed.  7. Thoroughly rinse your body with warm water from the neck down.  8. DO NOT shower/wash with your normal soap after using and rinsing off the CHG Soap.  9. Pat yourself dry with a CLEAN TOWEL.  10. Wear CLEAN PAJAMAS to bed the night before surgery, wear comfortable clothes the morning of surgery  11. Place CLEAN SHEETS on  your bed the night of your first shower and DO NOT SLEEP WITH PETS.    Day of Surgery:  Do not apply any deodorants/lotions.  Please wear clean clothes to the hospital/surgery center.   Remember to brush your teeth WITH YOUR REGULAR TOOTHPASTE.    Please read over the following fact sheets that you were given.

## 2017-09-23 MED ORDER — DOPAMINE-DEXTROSE 3.2-5 MG/ML-% IV SOLN
0.0000 ug/kg/min | INTRAVENOUS | Status: DC
  Filled 2017-09-23: qty 250

## 2017-09-23 MED ORDER — MAGNESIUM SULFATE 50 % IJ SOLN
40.0000 meq | INTRAMUSCULAR | Status: DC
  Filled 2017-09-23: qty 9.85

## 2017-09-23 MED ORDER — SODIUM CHLORIDE 0.9 % IV SOLN
INTRAVENOUS | Status: AC
  Administered 2017-09-24: .8 [IU]/h via INTRAVENOUS
  Filled 2017-09-23: qty 1

## 2017-09-23 MED ORDER — TRANEXAMIC ACID (OHS) PUMP PRIME SOLUTION
2.0000 mg/kg | INTRAVENOUS | Status: DC
  Filled 2017-09-23: qty 1.12

## 2017-09-23 MED ORDER — SODIUM CHLORIDE 0.9 % IV SOLN
750.0000 mg | INTRAVENOUS | Status: AC
  Administered 2017-09-24: 750 mg via INTRAVENOUS
  Filled 2017-09-23: qty 750

## 2017-09-23 MED ORDER — SODIUM CHLORIDE 0.9 % IV SOLN
INTRAVENOUS | Status: DC
  Filled 2017-09-23: qty 30

## 2017-09-23 MED ORDER — EPINEPHRINE PF 1 MG/ML IJ SOLN
0.0000 ug/min | INTRAVENOUS | Status: DC
  Filled 2017-09-23: qty 4

## 2017-09-23 MED ORDER — TRANEXAMIC ACID 1000 MG/10ML IV SOLN
1.5000 mg/kg/h | INTRAVENOUS | Status: AC
  Administered 2017-09-24: 1.5 mg/kg/h via INTRAVENOUS
  Filled 2017-09-23 (×2): qty 25

## 2017-09-23 MED ORDER — POTASSIUM CHLORIDE 2 MEQ/ML IV SOLN
80.0000 meq | INTRAVENOUS | Status: DC
  Filled 2017-09-23: qty 40

## 2017-09-23 MED ORDER — NITROGLYCERIN IN D5W 200-5 MCG/ML-% IV SOLN
2.0000 ug/min | INTRAVENOUS | Status: DC
  Filled 2017-09-23: qty 250

## 2017-09-23 MED ORDER — SODIUM CHLORIDE 0.9 % IV SOLN
30.0000 ug/min | INTRAVENOUS | Status: AC
  Administered 2017-09-24: 20 ug/min via INTRAVENOUS
  Administered 2017-09-24: 50 ug/min via INTRAVENOUS
  Filled 2017-09-23 (×4): qty 2

## 2017-09-23 MED ORDER — MILRINONE LACTATE IN DEXTROSE 20-5 MG/100ML-% IV SOLN
0.1250 ug/kg/min | INTRAVENOUS | Status: AC
  Administered 2017-09-24: 0.125 ug/kg/min via INTRAVENOUS
  Filled 2017-09-23: qty 100

## 2017-09-23 MED ORDER — SODIUM CHLORIDE 0.9 % IV SOLN
1.5000 g | INTRAVENOUS | Status: AC
  Administered 2017-09-24: 1.5 g via INTRAVENOUS
  Filled 2017-09-23: qty 1.5

## 2017-09-23 MED ORDER — TRANEXAMIC ACID (OHS) BOLUS VIA INFUSION
15.0000 mg/kg | INTRAVENOUS | Status: AC
  Administered 2017-09-24: 837 mg via INTRAVENOUS
  Filled 2017-09-23: qty 837

## 2017-09-23 MED ORDER — PLASMA-LYTE 148 IV SOLN
INTRAVENOUS | Status: DC
  Filled 2017-09-23: qty 2.5

## 2017-09-23 MED ORDER — DEXMEDETOMIDINE HCL IN NACL 400 MCG/100ML IV SOLN
0.1000 ug/kg/h | INTRAVENOUS | Status: AC
  Administered 2017-09-24: .2 ug/kg/h via INTRAVENOUS
  Filled 2017-09-23: qty 100

## 2017-09-23 MED ORDER — VANCOMYCIN HCL 10 G IV SOLR
1250.0000 mg | INTRAVENOUS | Status: AC
  Administered 2017-09-24: 1250 mg via INTRAVENOUS
  Filled 2017-09-23: qty 1250

## 2017-09-23 MED ORDER — MILRINONE LACTATE IN DEXTROSE 20-5 MG/100ML-% IV SOLN
0.3000 ug/kg/min | INTRAVENOUS | Status: DC
  Filled 2017-09-23: qty 100

## 2017-09-24 ENCOUNTER — Inpatient Hospital Stay (HOSPITAL_COMMUNITY): Payer: Medicare Other | Admitting: Vascular Surgery

## 2017-09-24 ENCOUNTER — Inpatient Hospital Stay (HOSPITAL_COMMUNITY)
Admission: RE | Admit: 2017-09-24 | Discharge: 2017-10-09 | DRG: 235 | Disposition: A | Discharge status: Home / Self Care | Payer: Medicare Other | Source: Ambulatory Visit | Attending: Cardiothoracic Surgery | Admitting: Cardiothoracic Surgery

## 2017-09-24 ENCOUNTER — Inpatient Hospital Stay (HOSPITAL_COMMUNITY)
Admission: RE | Disposition: A | Discharge status: Home / Self Care | Payer: Self-pay | Source: Ambulatory Visit | Attending: Cardiothoracic Surgery

## 2017-09-24 ENCOUNTER — Encounter (HOSPITAL_COMMUNITY): Payer: Self-pay | Admitting: Anesthesiology

## 2017-09-24 ENCOUNTER — Inpatient Hospital Stay (HOSPITAL_COMMUNITY): Payer: Medicare Other | Admitting: Certified Registered Nurse Anesthetist

## 2017-09-24 ENCOUNTER — Inpatient Hospital Stay (HOSPITAL_COMMUNITY): Payer: Medicare Other

## 2017-09-24 DIAGNOSIS — I10 Essential (primary) hypertension: Secondary | ICD-10-CM | POA: Diagnosis present

## 2017-09-24 DIAGNOSIS — F329 Major depressive disorder, single episode, unspecified: Secondary | ICD-10-CM | POA: Diagnosis present

## 2017-09-24 DIAGNOSIS — Z9889 Other specified postprocedural states: Secondary | ICD-10-CM

## 2017-09-24 DIAGNOSIS — I25110 Atherosclerotic heart disease of native coronary artery with unstable angina pectoris: Principal | ICD-10-CM | POA: Diagnosis present

## 2017-09-24 DIAGNOSIS — J939 Pneumothorax, unspecified: Secondary | ICD-10-CM

## 2017-09-24 DIAGNOSIS — Z8249 Family history of ischemic heart disease and other diseases of the circulatory system: Secondary | ICD-10-CM

## 2017-09-24 DIAGNOSIS — T797XXA Traumatic subcutaneous emphysema, initial encounter: Secondary | ICD-10-CM | POA: Diagnosis not present

## 2017-09-24 DIAGNOSIS — J449 Chronic obstructive pulmonary disease, unspecified: Secondary | ICD-10-CM | POA: Diagnosis present

## 2017-09-24 DIAGNOSIS — J9383 Other pneumothorax: Secondary | ICD-10-CM

## 2017-09-24 DIAGNOSIS — E8770 Fluid overload, unspecified: Secondary | ICD-10-CM | POA: Diagnosis not present

## 2017-09-24 DIAGNOSIS — E119 Type 2 diabetes mellitus without complications: Secondary | ICD-10-CM | POA: Diagnosis present

## 2017-09-24 DIAGNOSIS — Z90711 Acquired absence of uterus with remaining cervical stump: Secondary | ICD-10-CM | POA: Diagnosis not present

## 2017-09-24 DIAGNOSIS — D72829 Elevated white blood cell count, unspecified: Secondary | ICD-10-CM | POA: Diagnosis present

## 2017-09-24 DIAGNOSIS — D62 Acute posthemorrhagic anemia: Secondary | ICD-10-CM | POA: Diagnosis not present

## 2017-09-24 DIAGNOSIS — Z794 Long term (current) use of insulin: Secondary | ICD-10-CM

## 2017-09-24 DIAGNOSIS — J860 Pyothorax with fistula: Secondary | ICD-10-CM | POA: Diagnosis present

## 2017-09-24 DIAGNOSIS — K2270 Barrett's esophagus without dysplasia: Secondary | ICD-10-CM | POA: Diagnosis present

## 2017-09-24 DIAGNOSIS — Z87891 Personal history of nicotine dependence: Secondary | ICD-10-CM | POA: Diagnosis not present

## 2017-09-24 DIAGNOSIS — F419 Anxiety disorder, unspecified: Secondary | ICD-10-CM | POA: Diagnosis present

## 2017-09-24 DIAGNOSIS — D696 Thrombocytopenia, unspecified: Secondary | ICD-10-CM | POA: Diagnosis present

## 2017-09-24 DIAGNOSIS — J9811 Atelectasis: Secondary | ICD-10-CM | POA: Diagnosis present

## 2017-09-24 DIAGNOSIS — Z09 Encounter for follow-up examination after completed treatment for conditions other than malignant neoplasm: Secondary | ICD-10-CM

## 2017-09-24 DIAGNOSIS — Z882 Allergy status to sulfonamides status: Secondary | ICD-10-CM | POA: Diagnosis not present

## 2017-09-24 DIAGNOSIS — Z9689 Presence of other specified functional implants: Secondary | ICD-10-CM

## 2017-09-24 DIAGNOSIS — Z4682 Encounter for fitting and adjustment of non-vascular catheter: Secondary | ICD-10-CM

## 2017-09-24 DIAGNOSIS — Z91048 Other nonmedicinal substance allergy status: Secondary | ICD-10-CM

## 2017-09-24 DIAGNOSIS — K219 Gastro-esophageal reflux disease without esophagitis: Secondary | ICD-10-CM | POA: Diagnosis present

## 2017-09-24 DIAGNOSIS — Z9861 Coronary angioplasty status: Secondary | ICD-10-CM

## 2017-09-24 DIAGNOSIS — Z951 Presence of aortocoronary bypass graft: Secondary | ICD-10-CM

## 2017-09-24 DIAGNOSIS — I2510 Atherosclerotic heart disease of native coronary artery without angina pectoris: Secondary | ICD-10-CM

## 2017-09-24 DIAGNOSIS — E785 Hyperlipidemia, unspecified: Secondary | ICD-10-CM | POA: Diagnosis present

## 2017-09-24 DIAGNOSIS — I493 Ventricular premature depolarization: Secondary | ICD-10-CM | POA: Diagnosis not present

## 2017-09-24 HISTORY — PX: CORONARY ARTERY BYPASS GRAFT: SHX141

## 2017-09-24 HISTORY — PX: TEE WITHOUT CARDIOVERSION: SHX5443

## 2017-09-24 LAB — POCT I-STAT, CHEM 8
BUN: 5 mg/dL — ABNORMAL LOW (ref 8–23)
BUN: 4 mg/dL — ABNORMAL LOW (ref 8–23)
BUN: <3 mg/dL — ABNORMAL LOW (ref 8–23)
BUN: 3 mg/dL — ABNORMAL LOW (ref 8–23)
BUN: <3 mg/dL — ABNORMAL LOW (ref 8–23)
BUN: <3 mg/dL — ABNORMAL LOW (ref 8–23)
BUN: 3 mg/dL — ABNORMAL LOW (ref 8–23)
Calcium, Ion: 1.24 mmol/L (ref 1.15–1.40)
Calcium, Ion: 0.9 mmol/L — ABNORMAL LOW (ref 1.15–1.40)
Calcium, Ion: 1.24 mmol/L (ref 1.15–1.40)
Calcium, Ion: 0.95 mmol/L — ABNORMAL LOW (ref 1.15–1.40)
Calcium, Ion: 0.95 mmol/L — ABNORMAL LOW (ref 1.15–1.40)
Calcium, Ion: 0.98 mmol/L — ABNORMAL LOW (ref 1.15–1.40)
Calcium, Ion: 1.09 mmol/L — ABNORMAL LOW (ref 1.15–1.40)
Chloride: 97 mmol/L — ABNORMAL LOW (ref 98–111)
Chloride: 95 mmol/L — ABNORMAL LOW (ref 98–111)
Chloride: 99 mmol/L (ref 98–111)
Chloride: 96 mmol/L — ABNORMAL LOW (ref 98–111)
Chloride: 97 mmol/L — ABNORMAL LOW (ref 98–111)
Chloride: 98 mmol/L (ref 98–111)
Chloride: 101 mmol/L (ref 98–111)
Creatinine, Ser: 0.6 mg/dL (ref 0.44–1.00)
Creatinine, Ser: 0.3 mg/dL — ABNORMAL LOW (ref 0.44–1.00)
Creatinine, Ser: 0.5 mg/dL (ref 0.44–1.00)
Creatinine, Ser: 0.4 mg/dL — ABNORMAL LOW (ref 0.44–1.00)
Creatinine, Ser: 0.4 mg/dL — ABNORMAL LOW (ref 0.44–1.00)
Creatinine, Ser: 0.6 mg/dL (ref 0.44–1.00)
Creatinine, Ser: 0.7 mg/dL (ref 0.44–1.00)
Glucose, Bld: 99 mg/dL (ref 70–99)
Glucose, Bld: 109 mg/dL — ABNORMAL HIGH (ref 70–99)
Glucose, Bld: 89 mg/dL (ref 70–99)
Glucose, Bld: 127 mg/dL — ABNORMAL HIGH (ref 70–99)
Glucose, Bld: 116 mg/dL — ABNORMAL HIGH (ref 70–99)
Glucose, Bld: 120 mg/dL — ABNORMAL HIGH (ref 70–99)
Glucose, Bld: 177 mg/dL — ABNORMAL HIGH (ref 70–99)
HCT: 29 % — ABNORMAL LOW (ref 36.0–46.0)
HCT: 20 % — ABNORMAL LOW (ref 36.0–46.0)
HCT: 28 % — ABNORMAL LOW (ref 36.0–46.0)
HCT: 25 % — ABNORMAL LOW (ref 36.0–46.0)
HCT: 26 % — ABNORMAL LOW (ref 36.0–46.0)
HCT: 25 % — ABNORMAL LOW (ref 36.0–46.0)
HCT: 27 % — ABNORMAL LOW (ref 36.0–46.0)
Hemoglobin: 9.9 g/dL — ABNORMAL LOW (ref 12.0–15.0)
Hemoglobin: 6.8 g/dL — CL (ref 12.0–15.0)
Hemoglobin: 9.5 g/dL — ABNORMAL LOW (ref 12.0–15.0)
Hemoglobin: 8.5 g/dL — ABNORMAL LOW (ref 12.0–15.0)
Hemoglobin: 8.8 g/dL — ABNORMAL LOW (ref 12.0–15.0)
Hemoglobin: 8.5 g/dL — ABNORMAL LOW (ref 12.0–15.0)
Hemoglobin: 9.2 g/dL — ABNORMAL LOW (ref 12.0–15.0)
Potassium: 3.4 mmol/L — ABNORMAL LOW (ref 3.5–5.1)
Potassium: 3.1 mmol/L — ABNORMAL LOW (ref 3.5–5.1)
Potassium: 3.4 mmol/L — ABNORMAL LOW (ref 3.5–5.1)
Potassium: 4.4 mmol/L (ref 3.5–5.1)
Potassium: 4.2 mmol/L (ref 3.5–5.1)
Potassium: 3.8 mmol/L (ref 3.5–5.1)
Potassium: 3.9 mmol/L (ref 3.5–5.1)
Sodium: 135 mmol/L (ref 135–145)
Sodium: 137 mmol/L (ref 135–145)
Sodium: 138 mmol/L (ref 135–145)
Sodium: 134 mmol/L — ABNORMAL LOW (ref 135–145)
Sodium: 137 mmol/L (ref 135–145)
Sodium: 138 mmol/L (ref 135–145)
Sodium: 136 mmol/L (ref 135–145)
TCO2: 29 mmol/L (ref 22–32)
TCO2: 29 mmol/L (ref 22–32)
TCO2: 29 mmol/L (ref 22–32)
TCO2: 27 mmol/L (ref 22–32)
TCO2: 26 mmol/L (ref 22–32)
TCO2: 25 mmol/L (ref 22–32)
TCO2: 23 mmol/L (ref 22–32)

## 2017-09-24 LAB — POCT I-STAT 3, ART BLOOD GAS (G3+)
Acid-Base Excess: 3 mmol/L — ABNORMAL HIGH (ref 0.0–2.0)
Acid-Base Excess: 2 mmol/L (ref 0.0–2.0)
Acid-Base Excess: 1 mmol/L (ref 0.0–2.0)
Acid-Base Excess: 1 mmol/L (ref 0.0–2.0)
Bicarbonate: 27.2 mmol/L (ref 20.0–28.0)
Bicarbonate: 25 mmol/L (ref 20.0–28.0)
Bicarbonate: 27.6 mmol/L (ref 20.0–28.0)
Bicarbonate: 26.5 mmol/L (ref 20.0–28.0)
Bicarbonate: 26.7 mmol/L (ref 20.0–28.0)
O2 Saturation: 100 %
O2 Saturation: 99 %
O2 Saturation: 97 %
O2 Saturation: 97 %
O2 Saturation: 95 %
Patient temperature: 36.7
Patient temperature: 36.8
TCO2: 28 mmol/L (ref 22–32)
TCO2: 26 mmol/L (ref 22–32)
TCO2: 29 mmol/L (ref 22–32)
TCO2: 28 mmol/L (ref 22–32)
TCO2: 28 mmol/L (ref 22–32)
pCO2 arterial: 37.8 mmHg (ref 32.0–48.0)
pCO2 arterial: 43.9 mmHg (ref 32.0–48.0)
pCO2 arterial: 47.5 mmHg (ref 32.0–48.0)
pCO2 arterial: 44.8 mmHg (ref 32.0–48.0)
pCO2 arterial: 48 mmHg (ref 32.0–48.0)
pH, Arterial: 7.466 — ABNORMAL HIGH (ref 7.350–7.450)
pH, Arterial: 7.363 (ref 7.350–7.450)
pH, Arterial: 7.372 (ref 7.350–7.450)
pH, Arterial: 7.378 (ref 7.350–7.450)
pH, Arterial: 7.352 (ref 7.350–7.450)
pO2, Arterial: 396 mmHg — ABNORMAL HIGH (ref 83.0–108.0)
pO2, Arterial: 120 mmHg — ABNORMAL HIGH (ref 83.0–108.0)
pO2, Arterial: 99 mmHg (ref 83.0–108.0)
pO2, Arterial: 89 mmHg (ref 83.0–108.0)
pO2, Arterial: 77 mmHg — ABNORMAL LOW (ref 83.0–108.0)

## 2017-09-24 LAB — HEMOGLOBIN AND HEMATOCRIT, BLOOD
HCT: 27.3 % — ABNORMAL LOW (ref 36.0–46.0)
Hemoglobin: 9.3 g/dL — ABNORMAL LOW (ref 12.0–15.0)

## 2017-09-24 LAB — CBC
HCT: 34.7 % — ABNORMAL LOW (ref 36.0–46.0)
HCT: 29.6 % — ABNORMAL LOW (ref 36.0–46.0)
Hemoglobin: 11.7 g/dL — ABNORMAL LOW (ref 12.0–15.0)
Hemoglobin: 10 g/dL — ABNORMAL LOW (ref 12.0–15.0)
MCH: 31.5 pg (ref 26.0–34.0)
MCH: 31.6 pg (ref 26.0–34.0)
MCHC: 33.7 g/dL (ref 30.0–36.0)
MCHC: 33.8 g/dL (ref 30.0–36.0)
MCV: 93.5 fL (ref 78.0–100.0)
MCV: 93.7 fL (ref 78.0–100.0)
PLATELETS: 177 10*3/uL (ref 150–400)
Platelets: 156 10*3/uL (ref 150–400)
RBC: 3.71 MIL/uL — AB (ref 3.87–5.11)
RBC: 3.16 MIL/uL — ABNORMAL LOW (ref 3.87–5.11)
RDW: 13.7 % (ref 11.5–15.5)
RDW: 14.4 % (ref 11.5–15.5)
WBC: 23.2 10*3/uL — ABNORMAL HIGH (ref 4.0–10.5)
WBC: 14.1 10*3/uL — ABNORMAL HIGH (ref 4.0–10.5)

## 2017-09-24 LAB — GLUCOSE, CAPILLARY
Glucose-Capillary: 126 mg/dL — ABNORMAL HIGH (ref 70–99)
Glucose-Capillary: 112 mg/dL — ABNORMAL HIGH (ref 70–99)
Glucose-Capillary: 133 mg/dL — ABNORMAL HIGH (ref 70–99)
Glucose-Capillary: 122 mg/dL — ABNORMAL HIGH (ref 70–99)
Glucose-Capillary: 131 mg/dL — ABNORMAL HIGH (ref 70–99)
Glucose-Capillary: 180 mg/dL — ABNORMAL HIGH (ref 70–99)
Glucose-Capillary: 152 mg/dL — ABNORMAL HIGH (ref 70–99)

## 2017-09-24 LAB — PROTIME-INR
INR: 1.39
Prothrombin Time: 16.9 seconds — ABNORMAL HIGH (ref 11.4–15.2)

## 2017-09-24 LAB — PLATELET COUNT: Platelets: 143 10*3/uL — ABNORMAL LOW (ref 150–400)

## 2017-09-24 LAB — PREPARE RBC (CROSSMATCH)

## 2017-09-24 LAB — CREATININE, SERUM
Creatinine, Ser: 0.81 mg/dL (ref 0.44–1.00)
GFR calc Af Amer: >60 mL/min (ref >60)
GFR calc non Af Amer: >60 mL/min (ref >60)

## 2017-09-24 LAB — MAGNESIUM: Magnesium: 3 mg/dL — ABNORMAL HIGH (ref 1.7–2.4)

## 2017-09-24 LAB — APTT: aPTT: 31 seconds (ref 24–36)

## 2017-09-24 SURGERY — CORONARY ARTERY BYPASS GRAFTING (CABG)
Anesthesia: General | Site: Chest

## 2017-09-24 MED ORDER — SODIUM CHLORIDE 0.9 % IV SOLN
INTRAVENOUS | Status: DC
  Administered 2017-09-24: 250 mL via INTRAVENOUS

## 2017-09-24 MED ORDER — ROCURONIUM BROMIDE 10 MG/ML (PF) SYRINGE
PREFILLED_SYRINGE | INTRAVENOUS | Status: DC | PRN
  Administered 2017-09-24 (×3): 50 mg via INTRAVENOUS

## 2017-09-24 MED ORDER — DOCUSATE SODIUM 100 MG PO CAPS
200.0000 mg | ORAL_CAPSULE | Freq: Every day | ORAL | Status: DC
  Administered 2017-09-25 – 2017-10-07 (×11): 200 mg via ORAL
  Filled 2017-09-24 (×12): qty 2

## 2017-09-24 MED ORDER — LACTATED RINGERS IV SOLN: 500.0000 mL | Freq: Once | INTRAVENOUS | Status: DC | PRN

## 2017-09-24 MED ORDER — CHLORHEXIDINE GLUCONATE CLOTH 2 % EX PADS
6.0000 | MEDICATED_PAD | Freq: Every day | CUTANEOUS | Status: DC
  Administered 2017-09-24 – 2017-09-27 (×3): 6 via TOPICAL

## 2017-09-24 MED ORDER — NITROGLYCERIN IN D5W 200-5 MCG/ML-% IV SOLN: 0.0000 ug/min | INTRAVENOUS | Status: DC

## 2017-09-24 MED ORDER — ASPIRIN EC 325 MG PO TBEC
325.0000 mg | DELAYED_RELEASE_TABLET | Freq: Every day | ORAL | Status: DC
  Administered 2017-09-25 – 2017-10-09 (×14): 325 mg via ORAL
  Filled 2017-09-24 (×16): qty 1

## 2017-09-24 MED ORDER — VENLAFAXINE HCL ER 37.5 MG PO CP24
37.5000 mg | ORAL_CAPSULE | Freq: Every day | ORAL | Status: DC
  Administered 2017-09-25 – 2017-10-08 (×14): 37.5 mg via ORAL
  Filled 2017-09-24 (×14): qty 1

## 2017-09-24 MED ORDER — INSULIN REGULAR BOLUS VIA INFUSION
0.0000 [IU] | Freq: Three times a day (TID) | INTRAVENOUS | Status: DC
  Filled 2017-09-24: qty 10

## 2017-09-24 MED ORDER — BISACODYL 5 MG PO TBEC
10.0000 mg | DELAYED_RELEASE_TABLET | Freq: Every day | ORAL | Status: DC
  Administered 2017-09-25 – 2017-10-05 (×7): 10 mg via ORAL
  Filled 2017-09-24 (×11): qty 2

## 2017-09-24 MED ORDER — POTASSIUM CHLORIDE 10 MEQ/50ML IV SOLN
10.0000 meq | Freq: Once | INTRAVENOUS | Status: AC
  Administered 2017-09-24: 10 meq via INTRAVENOUS

## 2017-09-24 MED ORDER — OXYCODONE HCL 5 MG PO TABS
5.0000 mg | ORAL_TABLET | ORAL | Status: DC | PRN
  Administered 2017-09-26 – 2017-09-27 (×2): 10 mg via ORAL
  Administered 2017-10-02: 5 mg via ORAL
  Administered 2017-10-03: 10 mg via ORAL
  Administered 2017-10-04: 5 mg via ORAL
  Administered 2017-10-04: 10 mg via ORAL
  Filled 2017-09-24: qty 1
  Filled 2017-09-24 (×4): qty 2
  Filled 2017-09-24: qty 1

## 2017-09-24 MED ORDER — SODIUM CHLORIDE 0.9 % IV SOLN
INTRAVENOUS | Status: DC
  Filled 2017-09-24: qty 1

## 2017-09-24 MED ORDER — ORAL CARE MOUTH RINSE
15.0000 mL | Freq: Two times a day (BID) | OROMUCOSAL | Status: DC
  Administered 2017-09-25 – 2017-10-06 (×16): 15 mL via OROMUCOSAL

## 2017-09-24 MED ORDER — SODIUM CHLORIDE 0.9 % IV SOLN
1.5000 g | Freq: Two times a day (BID) | INTRAVENOUS | Status: AC
  Administered 2017-09-24 – 2017-09-26 (×4): 1.5 g via INTRAVENOUS
  Filled 2017-09-24 (×4): qty 1.5

## 2017-09-24 MED ORDER — PROPOFOL 10 MG/ML IV BOLUS
INTRAVENOUS | Status: DC | PRN
  Administered 2017-09-24: 50 mg via INTRAVENOUS

## 2017-09-24 MED ORDER — ROCURONIUM BROMIDE 50 MG/5ML IV SOSY
PREFILLED_SYRINGE | INTRAVENOUS | Status: AC
  Filled 2017-09-24: qty 5

## 2017-09-24 MED ORDER — CHLORHEXIDINE GLUCONATE CLOTH 2 % EX PADS
6.0000 | MEDICATED_PAD | Freq: Every day | CUTANEOUS | Status: DC
  Administered 2017-10-05: 6 via TOPICAL

## 2017-09-24 MED ORDER — SODIUM CHLORIDE 0.9% IV SOLUTION: Freq: Once | INTRAVENOUS | Status: DC

## 2017-09-24 MED ORDER — BISACODYL 10 MG RE SUPP: 10.0000 mg | Freq: Every day | RECTAL | Status: DC

## 2017-09-24 MED ORDER — CHLORHEXIDINE GLUCONATE 0.12% ORAL RINSE (MEDLINE KIT)
15.0000 mL | Freq: Two times a day (BID) | OROMUCOSAL | Status: DC
  Administered 2017-09-25 – 2017-09-30 (×6): 15 mL via OROMUCOSAL

## 2017-09-24 MED ORDER — PHENYLEPHRINE HCL-NACL 20-0.9 MG/250ML-% IV SOLN
0.0000 ug/min | INTRAVENOUS | Status: DC
  Filled 2017-09-24: qty 250

## 2017-09-24 MED ORDER — DEXMEDETOMIDINE HCL IN NACL 200 MCG/50ML IV SOLN
0.0000 ug/kg/h | INTRAVENOUS | Status: DC
  Filled 2017-09-24: qty 50

## 2017-09-24 MED ORDER — MORPHINE SULFATE (PF) 2 MG/ML IV SOLN
1.0000 mg | INTRAVENOUS | Status: AC | PRN
  Administered 2017-09-24 – 2017-09-25 (×2): 2 mg via INTRAVENOUS
  Filled 2017-09-24 (×2): qty 1

## 2017-09-24 MED ORDER — MAGNESIUM SULFATE 4 GM/100ML IV SOLN
4.0000 g | Freq: Once | INTRAVENOUS | Status: AC
  Administered 2017-09-24: 4 g via INTRAVENOUS
  Filled 2017-09-24: qty 100

## 2017-09-24 MED ORDER — ACETAMINOPHEN 650 MG RE SUPP
650.0000 mg | Freq: Once | RECTAL | Status: AC
  Administered 2017-09-24: 650 mg via RECTAL

## 2017-09-24 MED ORDER — HEPARIN SODIUM (PORCINE) 1000 UNIT/ML IJ SOLN
INTRAMUSCULAR | Status: AC
  Filled 2017-09-24: qty 1

## 2017-09-24 MED ORDER — FENTANYL CITRATE (PF) 250 MCG/5ML IJ SOLN
INTRAMUSCULAR | Status: DC | PRN
  Administered 2017-09-24: 150 ug via INTRAVENOUS
  Administered 2017-09-24: 250 ug via INTRAVENOUS
  Administered 2017-09-24: 100 ug via INTRAVENOUS
  Administered 2017-09-24: 50 ug via INTRAVENOUS
  Administered 2017-09-24: 350 ug via INTRAVENOUS
  Administered 2017-09-24: 250 ug via INTRAVENOUS
  Administered 2017-09-24: 100 ug via INTRAVENOUS

## 2017-09-24 MED ORDER — MIDAZOLAM HCL 2 MG/2ML IJ SOLN: 2.0000 mg | INTRAMUSCULAR | Status: DC | PRN

## 2017-09-24 MED ORDER — ORAL CARE MOUTH RINSE
15.0000 mL | OROMUCOSAL | Status: DC
  Administered 2017-09-24 (×2): 15 mL via OROMUCOSAL

## 2017-09-24 MED ORDER — FENTANYL CITRATE (PF) 250 MCG/5ML IJ SOLN
INTRAMUSCULAR | Status: AC
  Filled 2017-09-24: qty 25

## 2017-09-24 MED ORDER — CHLORHEXIDINE GLUCONATE 0.12 % MT SOLN
15.0000 mL | Freq: Once | OROMUCOSAL | Status: AC
  Administered 2017-09-24: 15 mL via OROMUCOSAL
  Filled 2017-09-24: qty 15

## 2017-09-24 MED ORDER — LIDOCAINE 2% (20 MG/ML) 5 ML SYRINGE
INTRAMUSCULAR | Status: DC | PRN
  Administered 2017-09-24: 60 mg via INTRAVENOUS

## 2017-09-24 MED ORDER — LACTATED RINGERS IV SOLN
INTRAVENOUS | Status: DC | PRN
  Administered 2017-09-24: 08:00:00 via INTRAVENOUS

## 2017-09-24 MED ORDER — MILRINONE LACTATE IN DEXTROSE 20-5 MG/100ML-% IV SOLN: 0.1250 ug/kg/min | INTRAVENOUS | Status: DC

## 2017-09-24 MED ORDER — SODIUM CHLORIDE 0.9% FLUSH: 10.0000 mL | INTRAVENOUS | Status: DC | PRN

## 2017-09-24 MED ORDER — ACETAMINOPHEN 500 MG PO TABS
1000.0000 mg | ORAL_TABLET | Freq: Four times a day (QID) | ORAL | Status: AC
  Administered 2017-09-25 – 2017-09-29 (×16): 1000 mg via ORAL
  Filled 2017-09-24 (×16): qty 2

## 2017-09-24 MED ORDER — FAMOTIDINE IN NACL 20-0.9 MG/50ML-% IV SOLN
20.0000 mg | Freq: Two times a day (BID) | INTRAVENOUS | Status: AC
  Administered 2017-09-24: 20 mg via INTRAVENOUS
  Filled 2017-09-24: qty 50

## 2017-09-24 MED ORDER — HEMOSTATIC AGENTS (NO CHARGE) OPTIME
TOPICAL | Status: DC | PRN
  Administered 2017-09-24: 1 via TOPICAL
  Administered 2017-09-24: 2 via TOPICAL
  Administered 2017-09-24: 1 via TOPICAL

## 2017-09-24 MED ORDER — LACTATED RINGERS IV SOLN
INTRAVENOUS | Status: DC | PRN
  Administered 2017-09-24: 07:00:00 via INTRAVENOUS

## 2017-09-24 MED ORDER — PROPOFOL 10 MG/ML IV BOLUS
INTRAVENOUS | Status: AC
  Filled 2017-09-24: qty 20

## 2017-09-24 MED ORDER — VANCOMYCIN HCL IN DEXTROSE 1-5 GM/200ML-% IV SOLN
1000.0000 mg | Freq: Once | INTRAVENOUS | Status: AC
  Administered 2017-09-24: 1000 mg via INTRAVENOUS
  Filled 2017-09-24: qty 200

## 2017-09-24 MED ORDER — EPHEDRINE 5 MG/ML INJ
INTRAVENOUS | Status: AC
  Filled 2017-09-24: qty 10

## 2017-09-24 MED ORDER — NOREPINEPHRINE 4 MG/250ML-% IV SOLN
0.0000 ug/min | INTRAVENOUS | Status: DC
  Administered 2017-09-24: 4 ug/min via INTRAVENOUS
  Filled 2017-09-24 (×2): qty 250

## 2017-09-24 MED ORDER — HEPARIN SODIUM (PORCINE) 1000 UNIT/ML IJ SOLN
INTRAMUSCULAR | Status: DC | PRN
  Administered 2017-09-24: 16000 [IU] via INTRAVENOUS
  Administered 2017-09-24: 2000 [IU] via INTRAVENOUS

## 2017-09-24 MED ORDER — LIDOCAINE 2% (20 MG/ML) 5 ML SYRINGE
INTRAMUSCULAR | Status: AC
  Filled 2017-09-24: qty 5

## 2017-09-24 MED ORDER — HEPARIN SODIUM (PORCINE) 1000 UNIT/ML IJ SOLN
INTRAMUSCULAR | Status: AC
  Filled 2017-09-24: qty 3

## 2017-09-24 MED ORDER — TRAMADOL HCL 50 MG PO TABS: 50.0000 mg | ORAL_TABLET | ORAL | Status: DC | PRN

## 2017-09-24 MED ORDER — MIDAZOLAM HCL 5 MG/5ML IJ SOLN
INTRAMUSCULAR | Status: DC | PRN
  Administered 2017-09-24: 2 mg via INTRAVENOUS
  Administered 2017-09-24 (×2): 1 mg via INTRAVENOUS
  Administered 2017-09-24 (×2): 3 mg via INTRAVENOUS

## 2017-09-24 MED ORDER — ACETAMINOPHEN 160 MG/5ML PO SOLN: 1000.0000 mg | Freq: Four times a day (QID) | ORAL | Status: AC

## 2017-09-24 MED ORDER — SODIUM CHLORIDE 0.9% FLUSH
3.0000 mL | Freq: Two times a day (BID) | INTRAVENOUS | Status: DC
  Administered 2017-09-25 – 2017-09-29 (×10): 3 mL via INTRAVENOUS

## 2017-09-24 MED ORDER — SODIUM CHLORIDE 0.9 % IV SOLN
20.0000 ug | Freq: Once | INTRAVENOUS | Status: AC
  Administered 2017-09-24: 20 ug via INTRAVENOUS
  Filled 2017-09-24: qty 5

## 2017-09-24 MED ORDER — PHENYLEPHRINE 40 MCG/ML (10ML) SYRINGE FOR IV PUSH (FOR BLOOD PRESSURE SUPPORT)
PREFILLED_SYRINGE | INTRAVENOUS | Status: AC
  Filled 2017-09-24: qty 10

## 2017-09-24 MED ORDER — LACTATED RINGERS IV SOLN: INTRAVENOUS | Status: DC

## 2017-09-24 MED ORDER — POTASSIUM CHLORIDE 10 MEQ/50ML IV SOLN
10.0000 meq | INTRAVENOUS | Status: AC
  Administered 2017-09-24 (×3): 10 meq via INTRAVENOUS
  Filled 2017-09-24: qty 50

## 2017-09-24 MED ORDER — PROTAMINE SULFATE 10 MG/ML IV SOLN
INTRAVENOUS | Status: DC | PRN
  Administered 2017-09-24: 180 mg via INTRAVENOUS

## 2017-09-24 MED ORDER — PANTOPRAZOLE SODIUM 40 MG PO TBEC
40.0000 mg | DELAYED_RELEASE_TABLET | Freq: Every day | ORAL | Status: DC
  Administered 2017-09-26 – 2017-10-09 (×13): 40 mg via ORAL
  Filled 2017-09-24 (×14): qty 1

## 2017-09-24 MED ORDER — PLASMA-LYTE 148 IV SOLN
INTRAVENOUS | Status: DC | PRN
  Administered 2017-09-24: 500 mL via INTRAVASCULAR

## 2017-09-24 MED ORDER — MORPHINE SULFATE (PF) 2 MG/ML IV SOLN
2.0000 mg | INTRAVENOUS | Status: DC | PRN
  Administered 2017-09-25: 2 mg via INTRAVENOUS
  Filled 2017-09-24: qty 1

## 2017-09-24 MED ORDER — ACETAMINOPHEN 160 MG/5ML PO SOLN: 650.0000 mg | Freq: Once | ORAL | Status: AC

## 2017-09-24 MED ORDER — ASPIRIN 81 MG PO CHEW
324.0000 mg | CHEWABLE_TABLET | Freq: Every day | ORAL | Status: DC
  Administered 2017-10-03: 324 mg
  Filled 2017-09-24 (×5): qty 4

## 2017-09-24 MED ORDER — SODIUM CHLORIDE 0.45 % IV SOLN: INTRAVENOUS | Status: DC | PRN

## 2017-09-24 MED ORDER — METOPROLOL TARTRATE 12.5 MG HALF TABLET
12.5000 mg | ORAL_TABLET | Freq: Once | ORAL | Status: AC
  Administered 2017-09-24: 12.5 mg via ORAL
  Filled 2017-09-24: qty 1

## 2017-09-24 MED ORDER — SODIUM CHLORIDE 0.9% FLUSH
3.0000 mL | INTRAVENOUS | Status: DC | PRN
  Administered 2017-09-29: 3 mL via INTRAVENOUS
  Filled 2017-09-24: qty 3

## 2017-09-24 MED ORDER — LACTATED RINGERS IV SOLN
INTRAVENOUS | Status: DC
  Administered 2017-09-24: 1000 mL via INTRAVENOUS

## 2017-09-24 MED ORDER — ALBUMIN HUMAN 5 % IV SOLN
250.0000 mL | INTRAVENOUS | Status: AC | PRN
  Administered 2017-09-24 (×3): 12.5 g via INTRAVENOUS
  Filled 2017-09-24: qty 500

## 2017-09-24 MED ORDER — ONDANSETRON HCL 4 MG/2ML IJ SOLN
4.0000 mg | Freq: Four times a day (QID) | INTRAMUSCULAR | Status: DC | PRN
  Administered 2017-09-24 – 2017-09-26 (×4): 4 mg via INTRAVENOUS
  Filled 2017-09-24 (×4): qty 2

## 2017-09-24 MED ORDER — 0.9 % SODIUM CHLORIDE (POUR BTL) OPTIME
TOPICAL | Status: DC | PRN
  Administered 2017-09-24: 6000 mL

## 2017-09-24 MED ORDER — ARTIFICIAL TEARS OPHTHALMIC OINT
TOPICAL_OINTMENT | OPHTHALMIC | Status: AC
  Filled 2017-09-24: qty 3.5

## 2017-09-24 MED ORDER — MIDAZOLAM HCL 10 MG/2ML IJ SOLN
INTRAMUSCULAR | Status: AC
  Filled 2017-09-24: qty 2

## 2017-09-24 MED ORDER — PROTAMINE SULFATE 10 MG/ML IV SOLN
INTRAVENOUS | Status: AC
  Filled 2017-09-24: qty 25

## 2017-09-24 MED ORDER — ARTIFICIAL TEARS OPHTHALMIC OINT
TOPICAL_OINTMENT | OPHTHALMIC | Status: DC | PRN
  Administered 2017-09-24: 1 via OPHTHALMIC

## 2017-09-24 MED ORDER — SODIUM CHLORIDE 0.9 % IV SOLN: 250.0000 mL | INTRAVENOUS | Status: DC

## 2017-09-24 MED ORDER — METOPROLOL TARTRATE 12.5 MG HALF TABLET
12.5000 mg | ORAL_TABLET | Freq: Two times a day (BID) | ORAL | Status: DC
  Administered 2017-09-25 – 2017-10-09 (×29): 12.5 mg via ORAL
  Filled 2017-09-24 (×29): qty 1

## 2017-09-24 MED ORDER — MEMANTINE HCL 10 MG PO TABS
10.0000 mg | ORAL_TABLET | Freq: Two times a day (BID) | ORAL | Status: DC
  Administered 2017-09-25 – 2017-10-09 (×29): 10 mg via ORAL
  Filled 2017-09-24 (×29): qty 1

## 2017-09-24 MED ORDER — ATORVASTATIN CALCIUM 20 MG PO TABS
20.0000 mg | ORAL_TABLET | Freq: Every day | ORAL | Status: DC
  Administered 2017-09-25 – 2017-10-08 (×14): 20 mg via ORAL
  Filled 2017-09-24 (×14): qty 1

## 2017-09-24 MED ORDER — CHLORHEXIDINE GLUCONATE 0.12 % MT SOLN
15.0000 mL | OROMUCOSAL | Status: AC
  Administered 2017-09-24: 15 mL via OROMUCOSAL

## 2017-09-24 MED ORDER — METOPROLOL TARTRATE 25 MG/10 ML ORAL SUSPENSION
12.5000 mg | Freq: Two times a day (BID) | ORAL | Status: DC
  Filled 2017-09-24 (×2): qty 10

## 2017-09-24 MED ORDER — METOPROLOL TARTRATE 5 MG/5ML IV SOLN: 2.5000 mg | INTRAVENOUS | Status: DC | PRN

## 2017-09-24 MED ORDER — SODIUM CHLORIDE 0.9% FLUSH
10.0000 mL | Freq: Two times a day (BID) | INTRAVENOUS | Status: DC
  Administered 2017-09-24 – 2017-10-05 (×14): 10 mL

## 2017-09-24 MED ORDER — PHENYLEPHRINE HCL 10 MG/ML IJ SOLN
INTRAMUSCULAR | Status: DC | PRN
  Administered 2017-09-24: 40 ug via INTRAVENOUS
  Administered 2017-09-24: 80 ug via INTRAVENOUS
  Administered 2017-09-24: 120 ug via INTRAVENOUS

## 2017-09-24 SURGICAL SUPPLY — 90 items
ADAPTER CARDIO PERF ANTE/RETRO (ADAPTER) ×4 IMPLANT
BAG DECANTER FOR FLEXI CONT (MISCELLANEOUS) ×4 IMPLANT
BANDAGE ACE 4X5 VEL STRL LF (GAUZE/BANDAGES/DRESSINGS) ×4 IMPLANT
BANDAGE ACE 6X5 VEL STRL LF (GAUZE/BANDAGES/DRESSINGS) ×4 IMPLANT
BASKET HEART  (ORDER IN 25'S) (MISCELLANEOUS) ×1
BASKET HEART (ORDER IN 25'S) (MISCELLANEOUS) ×1
BASKET HEART (ORDER IN 25S) (MISCELLANEOUS) ×2 IMPLANT
BLADE CLIPPER SURG (BLADE) IMPLANT
BLADE STERNUM SYSTEM 6 (BLADE) ×4 IMPLANT
BLADE SURG 12 STRL SS (BLADE) ×4 IMPLANT
BNDG GAUZE ELAST 4 BULKY (GAUZE/BANDAGES/DRESSINGS) ×4 IMPLANT
CANISTER SUCT 3000ML PPV (MISCELLANEOUS) ×4 IMPLANT
CANNULA GUNDRY RCSP 15FR (MISCELLANEOUS) ×4 IMPLANT
CATH CPB KIT VANTRIGT (MISCELLANEOUS) ×4 IMPLANT
CATH ROBINSON RED A/P 18FR (CATHETERS) ×12 IMPLANT
CATH THORACIC 36FR RT ANG (CATHETERS) ×4 IMPLANT
CLIP FOGARTY SPRING 6M (CLIP) ×4 IMPLANT
CLIP RETRACTION 3.0MM CORONARY (MISCELLANEOUS) ×4 IMPLANT
CLIP VESOCCLUDE SM WIDE 24/CT (CLIP) ×4 IMPLANT
CRADLE DONUT ADULT HEAD (MISCELLANEOUS) ×4 IMPLANT
DRAIN CHANNEL 32F RND 10.7 FF (WOUND CARE) ×4 IMPLANT
DRAPE CARDIOVASCULAR INCISE (DRAPES) ×2
DRAPE SLUSH/WARMER DISC (DRAPES) ×4 IMPLANT
DRAPE SRG 135X102X78XABS (DRAPES) ×2 IMPLANT
DRSG AQUACEL AG ADV 3.5X14 (GAUZE/BANDAGES/DRESSINGS) ×4 IMPLANT
ELECT BLADE 4.0 EZ CLEAN MEGAD (MISCELLANEOUS) ×4
ELECT BLADE 6.5 EXT (BLADE) ×4 IMPLANT
ELECT CAUTERY BLADE 6.4 (BLADE) ×4 IMPLANT
ELECT REM PT RETURN 9FT ADLT (ELECTROSURGICAL) ×8
ELECTRODE BLDE 4.0 EZ CLN MEGD (MISCELLANEOUS) ×2 IMPLANT
ELECTRODE REM PT RTRN 9FT ADLT (ELECTROSURGICAL) ×4 IMPLANT
FELT TEFLON 1X6 (MISCELLANEOUS) ×4 IMPLANT
GAUZE SPONGE 4X4 12PLY STRL (GAUZE/BANDAGES/DRESSINGS) ×8 IMPLANT
GLOVE BIO SURGEON STRL SZ7.5 (GLOVE) ×12 IMPLANT
GOWN STRL REUS W/ TWL LRG LVL3 (GOWN DISPOSABLE) ×8 IMPLANT
GOWN STRL REUS W/TWL LRG LVL3 (GOWN DISPOSABLE) ×8
HEMOSTAT POWDER SURGIFOAM 1G (HEMOSTASIS) IMPLANT
HEMOSTAT SURGICEL 2X14 (HEMOSTASIS) ×4 IMPLANT
INSERT FOGARTY XLG (MISCELLANEOUS) IMPLANT
KIT BASIN OR (CUSTOM PROCEDURE TRAY) ×4 IMPLANT
KIT SUCTION CATH 14FR (SUCTIONS) ×4 IMPLANT
KIT TURNOVER KIT B (KITS) ×4 IMPLANT
KIT VASOVIEW HEMOPRO VH 3000 (KITS) ×4 IMPLANT
LEAD PACING MYOCARDI (MISCELLANEOUS) ×4 IMPLANT
MARKER GRAFT CORONARY BYPASS (MISCELLANEOUS) ×12 IMPLANT
NS IRRIG 1000ML POUR BTL (IV SOLUTION) ×20 IMPLANT
PACK E OPEN HEART (SUTURE) ×4 IMPLANT
PACK OPEN HEART (CUSTOM PROCEDURE TRAY) ×4 IMPLANT
PAD ARMBOARD 7.5X6 YLW CONV (MISCELLANEOUS) ×8 IMPLANT
PAD ELECT DEFIB RADIOL ZOLL (MISCELLANEOUS) ×4 IMPLANT
PENCIL BUTTON HOLSTER BLD 10FT (ELECTRODE) ×4 IMPLANT
POWDER SURGICEL 3.0 GRAM (HEMOSTASIS) ×8 IMPLANT
PUNCH AORTIC ROTATE  4.5MM 8IN (MISCELLANEOUS) ×4 IMPLANT
PUNCH AORTIC ROTATE 4.0MM (MISCELLANEOUS) IMPLANT
PUNCH AORTIC ROTATE 4.5MM 8IN (MISCELLANEOUS) IMPLANT
PUNCH AORTIC ROTATE 5MM 8IN (MISCELLANEOUS) IMPLANT
SET CARDIOPLEGIA MPS 5001102 (MISCELLANEOUS) ×4 IMPLANT
SURGIFLO W/THROMBIN 8M KIT (HEMOSTASIS) ×4 IMPLANT
SUT BONE WAX W31G (SUTURE) ×4 IMPLANT
SUT MNCRL AB 4-0 PS2 18 (SUTURE) IMPLANT
SUT PROLENE 3 0 SH DA (SUTURE) ×4 IMPLANT
SUT PROLENE 3 0 SH1 36 (SUTURE) IMPLANT
SUT PROLENE 4 0 RB 1 (SUTURE) ×2
SUT PROLENE 4 0 SH DA (SUTURE) ×8 IMPLANT
SUT PROLENE 4-0 RB1 .5 CRCL 36 (SUTURE) ×2 IMPLANT
SUT PROLENE 5 0 C 1 36 (SUTURE) ×8 IMPLANT
SUT PROLENE 6 0 C 1 30 (SUTURE) ×32 IMPLANT
SUT PROLENE 6 0 CC (SUTURE) ×12 IMPLANT
SUT PROLENE 8 0 BV175 6 (SUTURE) IMPLANT
SUT PROLENE BLUE 7 0 (SUTURE) ×12 IMPLANT
SUT SILK  1 MH (SUTURE)
SUT SILK 1 MH (SUTURE) IMPLANT
SUT SILK 2 0 SH CR/8 (SUTURE) ×4 IMPLANT
SUT SILK 3 0 SH CR/8 (SUTURE) IMPLANT
SUT STEEL 6MS V (SUTURE) ×8 IMPLANT
SUT STEEL SZ 6 DBL 3X14 BALL (SUTURE) ×4 IMPLANT
SUT VIC AB 1 CTX 36 (SUTURE) ×4
SUT VIC AB 1 CTX36XBRD ANBCTR (SUTURE) ×4 IMPLANT
SUT VIC AB 2-0 CT1 27 (SUTURE) ×2
SUT VIC AB 2-0 CT1 TAPERPNT 27 (SUTURE) ×2 IMPLANT
SUT VIC AB 2-0 CTX 27 (SUTURE) IMPLANT
SUT VIC AB 3-0 X1 27 (SUTURE) ×8 IMPLANT
SYSTEM SAHARA CHEST DRAIN ATS (WOUND CARE) ×4 IMPLANT
TAPE CLOTH SURG 4X10 WHT LF (GAUZE/BANDAGES/DRESSINGS) ×4 IMPLANT
TOWEL GREEN STERILE (TOWEL DISPOSABLE) ×4 IMPLANT
TOWEL GREEN STERILE FF (TOWEL DISPOSABLE) ×4 IMPLANT
TRAY FOLEY SLVR 16FR TEMP STAT (SET/KITS/TRAYS/PACK) ×4 IMPLANT
TUBING INSUFFLATION (TUBING) ×4 IMPLANT
UNDERPAD 30X30 (UNDERPADS AND DIAPERS) ×4 IMPLANT
WATER STERILE IRR 1000ML POUR (IV SOLUTION) ×8 IMPLANT

## 2017-09-24 NOTE — Anesthesia Procedure Notes (Signed)
Central Venous Catheter Insertion Performed by: Suzette Battiest, MD, anesthesiologist Start/End9/06/2017 7:12 AM, 09/24/2017 7:22 AM Patient location: Pre-op. Preanesthetic checklist: patient identified, IV checked, site marked, risks and benefits discussed, surgical consent, monitors and equipment checked, pre-op evaluation, timeout performed and anesthesia consent Position: Trendelenburg Lidocaine 1% used for infiltration and patient sedated Hand hygiene performed , maximum sterile barriers used  and Seldinger technique used Catheter size: 8.5 Fr Total catheter length 10. Central line and PA cath was placed.Sheath introducer Swan type:thermodilution PA Cath depth:50 Procedure performed using ultrasound guided technique. Ultrasound Notes:anatomy identified, needle tip was noted to be adjacent to the nerve/plexus identified, no ultrasound evidence of intravascular and/or intraneural injection and image(s) printed for medical record Attempts: 1 Following insertion, line sutured, dressing applied and Biopatch. Post procedure assessment: blood return through all ports, free fluid flow and no air  Patient tolerated the procedure well with no immediate complications.

## 2017-09-24 NOTE — Anesthesia Preprocedure Evaluation (Signed)
Anesthesia Evaluation  Patient identified by MRN, date of birth, ID band Patient awake    Reviewed: Allergy & Precautions, NPO status , Patient's Chart, lab work & pertinent test results, reviewed documented beta blocker date and time   Airway Mallampati: II  TM Distance: >3 FB Neck ROM: Full    Dental  (+) Dental Advisory Given   Pulmonary COPD, former smoker,    breath sounds clear to auscultation       Cardiovascular hypertension, Pt. on medications and Pt. on home beta blockers + angina + CAD   Rhythm:Regular Rate:Normal     Neuro/Psych Anxiety Depression negative neurological ROS     GI/Hepatic Neg liver ROS, GERD  ,  Endo/Other  negative endocrine ROS  Renal/GU Renal disease     Musculoskeletal   Abdominal   Peds  Hematology negative hematology ROS (+)   Anesthesia Other Findings   Reproductive/Obstetrics                             Lab Results  Component Value Date   WBC 10.4 09/21/2017   HGB 13.7 09/21/2017   HCT 41.9 09/21/2017   MCV 98.6 09/21/2017   PLT 316 09/21/2017   Lab Results  Component Value Date   CREATININE 0.90 09/21/2017   BUN 5 (L) 09/21/2017   NA 133 (L) 09/21/2017   K 4.2 09/21/2017   CL 99 09/21/2017   CO2 26 09/21/2017    Anesthesia Physical Anesthesia Plan  ASA: IV  Anesthesia Plan: General   Post-op Pain Management:    Induction: Intravenous  PONV Risk Score and Plan: 3 and Dexamethasone, Ondansetron and Treatment may vary due to age or medical condition  Airway Management Planned: Oral ETT  Additional Equipment: Arterial line, CVP, PA Cath, TEE and Ultrasound Guidance Line Placement  Intra-op Plan:   Post-operative Plan: Post-operative intubation/ventilation  Informed Consent: I have reviewed the patients History and Physical, chart, labs and discussed the procedure including the risks, benefits and alternatives for the proposed  anesthesia with the patient or authorized representative who has indicated his/her understanding and acceptance.   Dental advisory given  Plan Discussed with: CRNA  Anesthesia Plan Comments:         Anesthesia Quick Evaluation

## 2017-09-24 NOTE — Progress Notes (Signed)
Pre Procedure note for inpatients:   Rhonda Hughes has been scheduled for Procedure(s): CORONARY ARTERY BYPASS GRAFTING (CABG) (N/A) TRANSESOPHAGEAL ECHOCARDIOGRAM (TEE) (N/A) today. The various methods of treatment have been discussed with the patient. After consideration of the risks, benefits and treatment options the patient has consented to the planned procedure.   The patient has been seen and labs reviewed. There are no changes in the patient's condition to prevent proceeding with the planned procedure today.  Recent labs:  Lab Results  Component Value Date   WBC 10.4 09/21/2017   HGB 13.7 09/21/2017   HCT 41.9 09/21/2017   PLT 316 09/21/2017   GLUCOSE 97 09/21/2017   ALT 19 09/21/2017   AST 23 09/21/2017   NA 133 (L) 09/21/2017   K 4.2 09/21/2017   CL 99 09/21/2017   CREATININE 0.90 09/21/2017   BUN 5 (L) 09/21/2017   CO2 26 09/21/2017   TSH 0.467 04/24/2015   INR 0.96 09/21/2017   HGBA1C 5.8 (H) 09/17/2017    Len Childs, MD 09/24/2017 7:04 AM

## 2017-09-24 NOTE — Anesthesia Procedure Notes (Signed)
Arterial Line Insertion Start/End9/06/2017 7:20 AM, 09/24/2017 7:25 AM Performed by: Laretta Alstrom, CRNA, CRNA  Patient location: Pre-op. Preanesthetic checklist: patient identified, IV checked, site marked, risks and benefits discussed, surgical consent, monitors and equipment checked, pre-op evaluation, timeout performed and anesthesia consent Lidocaine 1% used for infiltration Left, radial was placed Catheter size: 20 G Maximum sterile barriers used   Attempts: 1 Procedure performed without using ultrasound guided technique. Following insertion, Biopatch and dressing applied. Post procedure assessment: normal  Patient tolerated the procedure well with no immediate complications. Additional procedure comments: 1st attempt on Right side by Augusto Gamble, [email or web address].

## 2017-09-24 NOTE — Progress Notes (Signed)
  Echocardiogram Echocardiogram Transesophageal has been performed.  Rhonda Hughes 09/24/2017, 8:28 AM

## 2017-09-24 NOTE — Progress Notes (Deleted)
Patient ID: Rhonda Hughes, female   DOB: 18-Mar-1940, 77 y.o.   MRN: [id number]  TCTS Evening Rounds:   Hemodynamically stable on neo 40 mcg.   Has started to wake up on vent but still sleepy and not ready to wean.    CT output low  CBC    Component Value Date/Time   WBC 14.1 (H) 09/24/2017 2003   RBC 3.16 (L) 09/24/2017 2003   HGB 10.0 (L) 09/24/2017 2003   HGB 12.9 09/13/2017 1623   HCT 29.6 (L) 09/24/2017 2003   HCT 37.5 09/13/2017 1623   HCT 36 06/23/2010 1138   PLT 156 09/24/2017 2003   PLT 312 09/13/2017 1623   MCV 93.7 09/24/2017 2003   MCV 94 09/13/2017 1623   MCH 31.6 09/24/2017 2003   MCHC 33.8 09/24/2017 2003   RDW 14.4 09/24/2017 2003   RDW 12.2 (L) 09/13/2017 1623   LYMPHSABS 1.0 05/16/2017 0624   MONOABS 0.7 05/16/2017 0624   EOSABS 0.1 05/16/2017 0624   BASOSABS 0.0 05/16/2017 0624     BMET    Component Value Date/Time   NA 136 09/24/2017 2002   NA 133 (L) 09/13/2017 1623   K 3.9 09/24/2017 2002   K 3.8 06/23/2010 1140   CL 101 09/24/2017 2002   CO2 26 09/21/2017 0902   GLUCOSE 177 (H) 09/24/2017 2002   BUN 3 (L) 09/24/2017 2002   BUN 5 (L) 09/13/2017 1623   CREATININE 0.70 09/24/2017 2002   CREATININE 0.95 (H) 06/14/2015 1531   CALCIUM 9.8 09/21/2017 0902   GFRNONAA >60 09/21/2017 0902   GFRAA >60 09/21/2017 0902     A/P:  Stable postop course. Continue current plans. Wait until he is wide awake to wean and extubate with renal failure.

## 2017-09-24 NOTE — Procedures (Signed)
Extubation Procedure Note Pt extubated following Cardiac Rapid Wean. Follow commands. Protocols med. ABG +, NIF: -30, VC: 1.2L, Cuff leak +, No stridor.  Pt extubated to 4L [place]. Strong cough, Clear BBS. A&O to name/place.   Patient Details:   Name: Rhonda Hughes DOB: [birth year] MRN: [id number]   Airway Documentation:    Vent end date: 09/24/17 Vent end time: 2020   Evaluation  O2 sats: stable throughout Complications: No apparent complications Patient did tolerate procedure well. Bilateral Breath Sounds: Diminished   Yes  Marissa Nestle 09/24/2017, 8:25 PM

## 2017-09-24 NOTE — H&P (Signed)
History of Present Illness:       Rhonda Hughes is a 77 yo white female with known history of HTN, Hyperlipidemia, Anxiety, Barrett esophagus.  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  She has an extensive cardiac history dating back to 2013 at which time she underwent PCI x 2 to her LAD.  She had a repeat cath in 2014 and again underwent cardiac catheterization with subsequent stent placement to the OM branch.  In November of 2014 she presented with complaints of fatigue and shortness of breath.  Myoview stress test was negative and repeat cath showed patent stents.  She underwent repeat catheterization in April 2016 which showed stable CAD.  She was admitted to [facility] on 04/25/15 with dyspnea, chest pain with moderate exertion, and fatigue.  Her troponins during that admission was negative.  She had no acute EKG changes.  Stress test was obtained and was felt to be low risk with preserved EF.  She was again evaluated in April with continued dyspnea and chest pressure.  She was set up for repeat cardiac catheterization, however the patient canceled. She again presented for follow up in 05/2015 with continued complaints of chest pressure and continued dyspnea.  Cardiac catheterization showed moderate non-obstructive CAD.  The patient again presented for routine follow up on 09/13/2017.  She continued to complain of chest pressure and shortness of breath with exertion.  The patient is in the epigastric area and comes and goes.  She denied N/V, diaphoresis, and palpitations.  It was felt the patient would require cardiac catheterization as she was continuing to have chest discomfort.  Most recently similar in nature to prior to her stents being placed.  This was performed today and showed and showed multivessel CAD with LM involvement.  It was felt coronary bypass grafting would be indicated and TCTS consult was requested.  Currently the patient is chest pain free.   She states her symptoms are mainly short of breath.  She remains very active.  She wants to go home prior to surgery.  She was on Plavix prior to admission and she doesn't want to stay in the hospital for her washout.   Current Activity/ Functional Status: Patient is independent with mobility/ambulation, transfers, ADL's, IADL's.   Zubrod Score: At the time of surgery this patient's most appropriate activity status/level should be described as: []     0    Normal activity, no symptoms []     1    Restricted in physical strenuous activity but ambulatory, able to do out light work []     2    Ambulatory and capable of self care, unable to do work activities, up and about                 more than 50%  Of the time                            []     3    Only limited self care, in bed greater than 50% of waking hours []     4    Completely disabled, no self care, confined to bed or chair []     5    Moribund       Past Medical History:  Diagnosis Date  . AKI (acute kidney injury) ([place]) 05/16/2017  . Anxiety    . Barrett's esophagus without dysplasia    .  CAD (coronary artery disease)      a. LHC 10/16/11: dLM 20%, mLAD 90%, pOM1 30%, mOM1 70% (FFR not hemodynamically significant), prox and mid RCA 30%, EF 65%;  b. PCI 10/16/11: Promus DES x 2 to mLAD  . COPD (chronic obstructive pulmonary disease) ([place])    . Depression    . Diverticulosis    . Essential hypertension    . GERD (gastroesophageal reflux disease)    . Hyperlipidemia    . Rectocele             Past Surgical History:  Procedure Laterality Date  . ABDOMINAL HYSTERECTOMY      . CARDIAC CATHETERIZATION      . CARDIAC CATHETERIZATION N/A 06/21/2015    Procedure: Right/Left Heart Cath and Coronary Angiography;  Surgeon: [name], MD;  Location: [place] CV LAB;  Service: Cardiovascular;  Laterality: N/A;  . CARDIAC CATHETERIZATION   09/16/2017  . COLONOSCOPY   01/2008    Multiple diverticula in desc and sigm colon  .  ESOPHAGOGASTRODUODENOSCOPY   01/2008    Barrett's esophagus, gastritis, no h.pylori, due follow-up 01/2011  . ESOPHAGOGASTRODUODENOSCOPY   04/06/2011    Procedure: ESOPHAGOGASTRODUODENOSCOPY (EGD);  Surgeon: Danie Binder, MD;  Location[facility];  Service: Endoscopy;  Laterality: N/A;  8:30  . FLEXIBLE SIGMOIDOSCOPY   08/11/2010 RECTAL PAIN/PRESSURE    SML IH, TICS  . FRACTIONAL FLOW RESERVE WIRE N/A 10/16/2011    Procedure: FRACTIONAL FLOW RESERVE WIRE;  Surgeon: [name], MD;  Location: [facility] CATH LAB;  Service: Cardiovascular;  Laterality: N/A;  . LEFT HEART CATH AND CORONARY ANGIOGRAPHY N/A 09/16/2017    Procedure: LEFT HEART CATH AND CORONARY ANGIOGRAPHY;  Surgeon: [name], MD;  Location: [place] CV LAB;  Service: Cardiovascular;  Laterality: N/A;  . LEFT HEART CATHETERIZATION WITH CORONARY ANGIOGRAM N/A 03/07/2012    Procedure: LEFT HEART CATHETERIZATION WITH CORONARY ANGIOGRAM;  Surgeon: [name], MD;  Location: [facility] CATH LAB;  Service: Cardiovascular;  Laterality: N/A;  . LEFT HEART CATHETERIZATION WITH CORONARY ANGIOGRAM N/A 12/14/2012    Procedure: LEFT HEART CATHETERIZATION WITH CORONARY ANGIOGRAM;  Surgeon: [name], MD;  Location: [facility] CATH LAB;  Service: Cardiovascular;  Laterality: N/A;  . LEFT HEART CATHETERIZATION WITH CORONARY ANGIOGRAM N/A 05/07/2014    Procedure: LEFT HEART CATHETERIZATION WITH CORONARY ANGIOGRAM;  Surgeon: [name], MD;  Location: [facility] CATH LAB;  Service: Cardiovascular;  Laterality: N/A;  . PARTIAL HYSTERECTOMY   1980s      Social History        Tobacco Use  Smoking Status Former Smoker  . Packs/day: 1.00  . Years: 53.00  . Pack years: 53.00  . Types: Cigarettes  . Last attempt to quit: 03/20/2007  . Years since quitting: 10.5  Smokeless Tobacco Never Used    Social History       Substance and Sexual Activity  Alcohol Use No             Allergies  Allergen Reactions  .  Adhesive [Tape] Other (See Comments)      Turns skin red  . Sulfa Antibiotics Other (See Comments)      Childhood allergy               Current Facility-Administered Medications  Medication Dose Route Frequency Provider Last Rate Last Dose  . 0.9 %  sodium chloride infusion   Intravenous Continuous [name], MD      . 0.9 %  sodium chloride infusion  250 mL Intravenous PRN [name], MD      . acetaminophen (TYLENOL) tablet 650 mg  650 mg Oral Q4H PRN [name], MD      . ALPRAZolam Duanne Moron) tablet 0.25 mg  0.25 mg Oral BID PRN [name], MD      . atorvastatin (LIPITOR) tablet 20 mg  20 mg Oral QHS [name], MD      . lisinopril (PRINIVIL,ZESTRIL) tablet 20 mg  20 mg Oral QHS Skeet Simmer, [facility]        And  . hydrochlorothiazide (MICROZIDE) capsule 12.5 mg  12.5 mg Oral QHS Skeet Simmer, RPH      . isosorbide mononitrate (IMDUR) 24 hr tablet 30 mg  30 mg Oral QHS [name], MD      . memantine [facility]) tablet 10 mg  10 mg Oral BID [name], MD   10 mg at 09/16/17 1417  . ondansetron (ZOFRAN) injection 4 mg  4 mg Intravenous Q6H PRN [name], MD      . pantoprazole (PROTONIX) EC tablet 20 mg  20 mg Oral Daily [name], MD   20 mg at 09/16/17 1418  . sodium chloride flush (NS) 0.9 % injection 3 mL  3 mL Intravenous Q12H [name], MD   3 mL at 09/16/17 1418  . sodium chloride flush (NS) 0.9 % injection 3 mL  3 mL Intravenous PRN [name], MD      . venlafaxine XR (EFFEXOR-XR) 24 hr capsule 37.5 mg  37.5 mg Oral QHS [name], MD      . zolpidem (AMBIEN) tablet 5 mg  5 mg Oral QHS PRN [name], MD                 Medications Prior to Admission  Medication Sig Dispense Refill Last Dose  . ALPRAZolam (XANAX) 0.25 MG tablet Take 0.25 mg by mouth 2 (two) times daily as needed for anxiety.      09/15/2017  at 2100  . atorvastatin (LIPITOR) 10 MG tablet Take 10 mg by mouth at bedtime.      09/15/2017 at 2100  . clopidogrel (PLAVIX) 75 MG tablet Take 1 tablet (75 mg total) by mouth daily with breakfast. 90 tablet 0 09/16/2017 at 0515  . diphenhydramine-acetaminophen (TYLENOL PM) 25-500 MG TABS Take 2 tablets by mouth at bedtime as needed (for sleep).      Past Week at Unknown time  . docusate sodium (COLACE) 100 MG capsule Take 200 mg by mouth daily as needed for mild constipation.      Past Week at Unknown time  . isosorbide mononitrate (IMDUR) 30 MG 24 hr tablet TAKE 1 TABLET BY MOUTH  DAILY (Patient taking differently: Take 30 mg by mouth at bedtime. ) 90 tablet 2 09/15/2017 at 2100  . lisinopril-hydrochlorothiazide (PRINZIDE,ZESTORETIC) 20-12.5 MG tablet Take 1 tablet by mouth daily. Hold medication until follow-up with PCP (Patient taking differently: Take 1 tablet by mouth at bedtime. )     09/15/2017 at 2100  . memantine (NAMENDA) 10 MG tablet Take 10 mg by mouth 2 (two) times daily.      09/15/2017 at 2100  . pantoprazole (PROTONIX) 20 MG tablet Take 20 mg by mouth daily.     09/15/2017 at 2100  . Polyvinyl Alcohol-Povidone PF (REFRESH) 1.4-0.6 % SOLN Place 1-2 drops into both eyes 2 (two) times daily as needed (  for eye irritation).      Past Week at Unknown time  . potassium chloride SA (K-DUR,KLOR-CON) 20 MEQ tablet Take 1 tablet (20 mEq total) by mouth daily.     09/15/2017 at 0900  . venlafaxine XR (EFFEXOR-XR) 37.5 MG 24 hr capsule Take 37.5 mg by mouth at bedtime.     09/15/2017 at 2100  . zolpidem (AMBIEN) 10 MG tablet Take 10 mg by mouth at bedtime.     09/15/2017 at 2100           Family History  Problem Relation Age of Onset  . Coronary artery disease Father 89  . Heart attack Father 60  . GI problems Mother          Perforated colon   . Coronary artery disease Paternal Aunt    . Coronary artery disease Paternal Uncle    . Colon cancer Neg Hx    . Gastric cancer Neg Hx    . Esophageal  cancer Neg Hx      Review of Systems:    ROS Constitutional: negative Respiratory: positive for dyspnea on exertion Cardiovascular: positive for chest pressure/discomfort Gastrointestinal: negative Neurological: negative                Cardiac Review of Systems: Y or  [    ]= no             Chest Pain [ Y   ]        Resting SOB [   ]         Exertional SOB  [Y  ]  Orthopnea [  ]             Pedal Edema [ N  ]     Palpitations [  ]            Syncope  [  ]    Presyncope [   ]             General Review of Systems: [Y] = yes [  ]=no Constitional: recent weight change Aqua.Slicker  ]; anorexia [  ]; fatigue [  ]; nausea [  ]; night sweats [  ]; fever [N  ]; or chills [  ]                                                               Dental: Last Dentist visit: approximately 1 month ago               Eye : blurred vision [  ]; diplopia [   ]; vision changes [  ];  Amaurosis fugax[  ]; Resp: cough [  ];  wheezing[  ];  hemoptysis[  ]; shortness of breath[Y  ]; paroxysmal nocturnal dyspnea[  ]; dyspnea on exertion[  ]; or orthopnea[  ];  GI:  gallstones[  ], vomiting[ N ];  dysphagia[  ]; melena[  ];  hematochezia [  ]; heartburn[  ];   Hx of  Colonoscopy[  ]; GU: kidney stones [  ]; hematuria[  ];   dysuria [  ];  nocturia[  ];  history of     obstruction [  ]; urinary frequency [  ]  Skin: rash, swelling[N  ];, hair loss[  ];  peripheral edema[ N ];  or itching[  ]; Musculosketetal: myalgias[  ];  joint swelling[  ];  joint erythema[  ];  joint pain[  ];  back pain[  ];             Heme/Lymph: bruising[  ];  bleeding[  ];  anemia[  ];  Neuro: TIA[  ];  headaches[  ];  stroke[  ];  vertigo[  ];  seizures[  ];   paresthesias[  ];  difficulty walking[  ];             Psych:depression[  ]; anxiety[N ];   Physical Exam: BP (!) 115/50 (BP Location: Left Arm)   Pulse 76   Temp 97.9 F (36.6 C) (Oral)   Resp 16   Ht 5' 1.5" (1.562 m)   Wt 54.9 kg   SpO2 96%   BMI 22.51 kg/m    General  appearance: alert, cooperative and no distress Head: Normocephalic, without obvious abnormality, atraumatic Neck: no adenopathy, no carotid bruit, no JVD, supple, symmetrical, trachea midline and thyroid not enlarged, symmetric, no tenderness/mass/nodules Resp: clear to auscultation bilaterally Cardio: regular rate and rhythm GI: soft, non-tender; bowel sounds normal; no masses,  no organomegaly Extremities: extremities normal, atraumatic, no cyanosis or edema Neurologic: Grossly normal   Diagnostic Studies & Laboratory data:     LM lesion is 30% stenosed.  Prox LAD lesion is 30% stenosed.  Mid LAD to Dist LAD lesion is 10% stenosed.  Ost LAD lesion is 90% stenosed.  Ost Ramus lesion is 50% stenosed.  Ost Cx to Prox Cx lesion is 80% stenosed.  Prox Cx to Mid Cx lesion is 20% stenosed.  Ost RCA lesion is 30% stenosed.  Mid RCA-1 lesion is 20% stenosed.  Mid RCA-2 lesion is 65% stenosed.  Mid LM to Ost LAD lesion is 80% stenosed.  Previously placed 2nd Mrg stent (unknown type) is widely patent.  Previously placed Prox LAD to Mid LAD stent (unknown type) is widely patent.  Ost 1st Mrg lesion is 80% stenosed.  The left ventricular systolic function is normal.  LV end diastolic pressure is normal.  The left ventricular ejection fraction is 55-65% by visual estimate.  There is no mitral valve regurgitation.   1. Severe distal left main artery stenosis with hazy appearance involving the ostia of the LAD, intermediate and Circumflex arteries.  2. Severe ostial LAD stenosis. (Best seen in the LAO 43/CAU 18 view). Patent mid LAD stents.  3. Severe ostial intermediate branch stenosis 4. Severe ostial Circumflex stenosis (best seen in the LAO 49/CAU 8 view). Patent obtuse marginal stent 5. Moderately severe stenosis in the mid RCA 6. Normal LV systolic function   I have independently reviewed the above radiologic studies and discussed with the patient    Recent Lab  Findings: Recent Labs       Lab Results  Component Value Date    WBC 10.9 (H) 09/13/2017    HGB 12.9 09/13/2017    HCT 37.5 09/13/2017    PLT 312 09/13/2017    GLUCOSE 84 09/13/2017    ALT 15 04/24/2015    AST 20 04/24/2015    NA 133 (L) 09/13/2017    K 3.6 09/13/2017    CL 92 (L) 09/13/2017    CREATININE 0.83 09/13/2017    BUN 5 (L) 09/13/2017    CO2 27 09/13/2017    TSH 0.467 04/24/2015    INR  0.94 06/14/2015        Assessment / Plan:      1. Multivessel CAD- H/O stents in 2013,2014-- she has been complaining of pain and shortness of breath since original presentation in 2013.  She was on Plavix preoperatively, this is currently on hold.  She is chest pain free currently 2. HTN 3. Hyperlipidemia 4. Dispo- patient chest pain free, will require Plavix washout prior to proceeding with surgery.  Planning for next Thursday.. Patient wishes to be discharged home prior to surgery, will defer to Cardiology service if patient is able to be discharged prior to surgery.  Dr. Prescott Gum will evaluate patient and follow up with further recommendations.     I  spent 55 minutes counseling the patient face to face.   Junie Panning Barrett PA-C 09/16/2017 3:04 PM   Patient examined, coronary angiogram and echocardiogram images personally reviewed and counseled patient and husband. Patient has had chest pressure like sensation with history of previous PCI to the LAD and circumflex.  Cardiac catheterization today shows significant distal left main stenosis. She would benefit from bypass graft to the LAD circumflex marginal and ramus.  She will need a Plavix washout prior to surgery.  I agree with the plan for allowing the patient to be discharged and return for scheduled surgery next week, probably Thursday, September 4. I discussed the procedure of CABG in detail with the patient and her husband including the expected benefits, the usual recovery, and the potential risks.          09-24-17  No change  in exam or symptoms Plan multivessel CABG for L main stenosis She understands the benefits and risks P Prescott Gum MD

## 2017-09-24 NOTE — Progress Notes (Signed)
Patient ID: HANNAHGRACE LALLI, female   DOB: 08/23/1940, 77 y.o.   MRN: [id number]  TCTS Evening Rounds:   Hemodynamically stable  CI = 2.1 on Milrinone 0.125 and NE 3  Just extubated.  Urine output good  CT output low  CBC    Component Value Date/Time   WBC 23.2 (H) 09/24/2017 1352   RBC 3.71 (L) 09/24/2017 1352   HGB 9.2 (L) 09/24/2017 2002   HGB 12.9 09/13/2017 1623   HCT 27.0 (L) 09/24/2017 2002   HCT 37.5 09/13/2017 1623   HCT 36 06/23/2010 1138   PLT 177 09/24/2017 1352   PLT 312 09/13/2017 1623   MCV 93.5 09/24/2017 1352   MCV 94 09/13/2017 1623   MCH 31.5 09/24/2017 1352   MCHC 33.7 09/24/2017 1352   RDW 13.7 09/24/2017 1352   RDW 12.2 (L) 09/13/2017 1623   LYMPHSABS 1.0 05/16/2017 0624   MONOABS 0.7 05/16/2017 0624   EOSABS 0.1 05/16/2017 0624   BASOSABS 0.0 05/16/2017 0624     BMET    Component Value Date/Time   NA 136 09/24/2017 2002   NA 133 (L) 09/13/2017 1623   K 3.9 09/24/2017 2002   K 3.8 06/23/2010 1140   CL 101 09/24/2017 2002   CO2 26 09/21/2017 0902   GLUCOSE 177 (H) 09/24/2017 2002   BUN 3 (L) 09/24/2017 2002   BUN 5 (L) 09/13/2017 1623   CREATININE 0.70 09/24/2017 2002   CREATININE 0.95 (H) 06/14/2015 1531   CALCIUM 9.8 09/21/2017 0902   GFRNONAA >60 09/21/2017 0902   GFRAA >60 09/21/2017 0902     A/P:  Stable postop course. Continue current plans

## 2017-09-24 NOTE — Progress Notes (Signed)
Patient was placed back on SIMV, RR 10 due to patient having periods of apnea. Will continue to monitor for ability to wean.

## 2017-09-24 NOTE — Transfer of Care (Signed)
Immediate Anesthesia Transfer of Care Note  Patient: Rhonda Hughes  Procedure(s) Performed: CORONARY ARTERY BYPASS GRAFTING (CABG) x4 using mammary artery and saphenous vein. LIMA to LAD, SVG to RCA, SVG to RAMUS, SVT to OM. Endoscopic saphenous vein harvest. (N/A Chest) TRANSESOPHAGEAL ECHOCARDIOGRAM (TEE) (N/A )  Patient Location: SICU  Anesthesia Type:General  Level of Consciousness: sedated and Patient remains intubated per anesthesia plan  Airway & Oxygen Therapy: Patient remains intubated per anesthesia plan and Patient placed on Ventilator (see vital sign flow sheet for setting)  Post-op Assessment: Report given to RN and Post -op Vital signs reviewed and stable  Post vital signs: Reviewed and stable  Last Vitals:  Vitals Value Taken Time  BP    Temp 35.1 C 09/24/2017  2:23 PM  Pulse 88 09/24/2017  2:23 PM  Resp 12 09/24/2017  2:23 PM  SpO2 96 % 09/24/2017  2:23 PM  Vitals shown include unvalidated device data.  Last Pain:  Vitals:   09/24/17 0559  TempSrc: Oral         Complications: No apparent anesthesia complications

## 2017-09-24 NOTE — Progress Notes (Signed)
EKG CRITICAL VALUE     12 lead EKG performed.  Critical value noted.  Thayer Headings, RN notified.   Marilynne Halsted, CCT 09/24/2017 3:27 PM

## 2017-09-24 NOTE — Anesthesia Procedure Notes (Signed)
Central Venous Catheter Insertion Performed by: Suzette Battiest, MD, anesthesiologist Start/End9/06/2017 7:12 AM, 09/24/2017 7:22 AM Patient location: Pre-op. Preanesthetic checklist: patient identified, IV checked, site marked, risks and benefits discussed, surgical consent, monitors and equipment checked, pre-op evaluation, timeout performed and anesthesia consent Hand hygiene performed  and maximum sterile barriers used  PA cath was placed.Swan type:thermodilution PA Cath depth:45 Procedure performed without using ultrasound guided technique. Attempts: 1 Patient tolerated the procedure well with no immediate complications.

## 2017-09-24 NOTE — Anesthesia Postprocedure Evaluation (Signed)
Anesthesia Post Note  Patient: Rhonda Hughes  Procedure(s) Performed: CORONARY ARTERY BYPASS GRAFTING (CABG) x4 using mammary artery and saphenous vein. LIMA to LAD, SVG to RCA, SVG to RAMUS, SVT to OM. Endoscopic saphenous vein harvest. (N/A Chest) TRANSESOPHAGEAL ECHOCARDIOGRAM (TEE) (N/A )     Patient location during evaluation: SICU Anesthesia Type: General Level of consciousness: sedated Pain management: pain level controlled Vital Signs Assessment: post-procedure vital signs reviewed and stable Respiratory status: patient remains intubated per anesthesia plan Cardiovascular status: stable Postop Assessment: no apparent nausea or vomiting Anesthetic complications: no    Last Vitals:  Vitals:   09/24/17 0559  BP: (!) 154/72  Pulse: 74  Resp: 18  Temp: 36.7 C  SpO2: 99%    Last Pain:  Vitals:   09/24/17 0559  TempSrc: Oral                 Tiajuana Amass

## 2017-09-24 NOTE — Brief Op Note (Signed)
09/24/2017  8:26 AM  PATIENT:  Rhonda Hughes  77 y.o. female  PRE-OPERATIVE DIAGNOSIS:  CAD  POST-OPERATIVE DIAGNOSIS:  CAD  PROCEDURE:  Procedure(s): CORONARY ARTERY BYPASS GRAFTING (CABG) x4 using mammary artery and saphenous vein. LIMA to LAD, SVG to RCA, SVG to RAMUS, SVT to OM. Endoscopic saphenous vein harvest. (N/A) TRANSESOPHAGEAL ECHOCARDIOGRAM (TEE) (N/A)  SURGEON:  Surgeon(s) and Role:    Ivin Poot, MD - Primary  PHYSICIAN ASSISTANT: Demitrus Francisco PA-C  ANESTHESIA:   general  EBL:  1000 mL   BLOOD ADMINISTERED:2 UNITS PRBC'S AND 1 FFP  DRAINS: PLEURAL AND MEDIASTINAL CHEST TUBES   LOCAL MEDICATIONS USED:  NONE  SPECIMEN:  No Specimen  DISPOSITION OF SPECIMEN:  N/A  COUNTS:  YES  TOURNIQUET:  * No tourniquets in log *  DICTATION: .Other Dictation: Dictation Number [id number]  PLAN OF CARE: Admit to inpatient   PATIENT DISPOSITION:  ICU - intubated and hemodynamically stable.   Delay start of Pharmacological VTE agent (>24hrs) due to surgical blood loss or risk of bleeding: yes

## 2017-09-24 NOTE — Op Note (Signed)
NAME: Rhonda Hughes, Rhonda Hughes MEDICAL RECORD ZO:[id number] ACCOUNT [id number] DATE OF BIRTH:1941/01/17 FACILITY: [facility] LOCATION: [facility] PHYSICIAN:PETER VAN TRIGT III, MD  OPERATIVE REPORT  DATE OF PROCEDURE:  09/24/2017  PROCEDURE PERFORMED:   1.  Coronary artery bypass grafting x4 (left internal mammary artery to left anterior descending, saphenous vein graft to ramus intermedius, saphenous vein graft to circumflex marginal, saphenous vein graft to right coronary artery). 2.  Endoscopic harvest of right leg greater saphenous vein.  SURGEON:  Ivin Poot, MD  ASSISTANT:  Jadene Pierini, PA-C.  ANESTHESIA:  General by Dr. Spero Geralds.  PREOPERATIVE DIAGNOSIS:  Unstable angina, left main stenosis, history of previous percutaneous coronary intervention for diffuse coronary artery disease, history of hypertension and recently diagnosed left upper lobe 2 cm lobular density suspicious for  lung cancer.  POSTOPERATIVE DIAGNOSIS:  Unstable angina, left main stenosis, history of previous percutaneous coronary intervention for diffuse coronary artery disease, history of hypertension and recently diagnosed left upper lobe 2 cm lobular density suspicious for  lung cancer.  CLINICAL NOTE:  The patient is a 77 year old female, hypertensive, ex-smoker who presented with chest pains and ruled in for non-STEMI.  Cardiac catheterization demonstrated stents in her LAD and circumflex previously placed, but a proximal high-grade  left main stenosis.  There is also 65% stenosis of the mid RCA.  Her LV function was fairly well preserved.  She had been on Plavix for the previous stents.  She was recommended for surgical coronary revascularization.  I saw the patient in consultation  after reviewing the images of her cardiac catheterization and echocardiogram.  The echocardiogram showed LVH with normal LV function.  The procedure of CABG was discussed in detail with the patient and her family.  We discussed  the use of general anesthesia and cardiopulmonary bypass, the location of surgical incisions, and the expected postoperative hospital recovery.  We discussed the  potential risks to her of CABG including risk of stroke, bleeding, infection, organ failure, postoperative pulmonary problems including pleural effusion, and death.  After reviewing these issues, she demonstrated her understanding and agreed to proceed  with surgery under what I felt was informed consent.  OPERATIVE FINDINGS: 1.  Adequate target. 2.  Preoperative anemia which required 2 units of packed cells transfused to the bypass circuit. 3.  Suboptimal targets with severe diffuse calcification. 4.  Preserved LV systolic function after separation from cardiopulmonary bypass.  DESCRIPTION OF PROCEDURE:  The patient was brought to the operating room and placed supine on the operating table.  General anesthesia was induced under invasive hemodynamic monitoring.  The chest, abdomen and legs were prepped with Betadine, draped as a  sterile field.  A proper time-out was performed.  A sternal incision was made as the saphenous vein was harvested endoscopically from the right leg.  The left internal mammary artery was harvested as a pedicle graft from its origin at the subclavian  vessels.  It had good flow, but was a small vessel 1.0-1.2 mm.  The sternum was retracted and the pericardium opened and suspended.  Pursestrings were placed in the ascending aorta and right atrium and the patient was heparinized.  The patient was then cannulated and placed on cardiopulmonary bypass.  The coronaries  were identified for grafting, and the mammary artery and vein grafts were prepared for the distal anastomoses.  Cardioplegic cannulas were placed both antegrade and retrograde cold blood cardioplegia.  The patient was cooled to 32 degrees, and the aortic  crossclamp was applied.  One liter  of cold blood cardioplegia was delivered in split doses between the  antegrade aortic and retrograde coronary sinus catheters.  There was good cardioplegic arrest and supple temperature dropped less than 12 degrees.   Cardioplegia was delivered every 20 minutes.  The distal coronary anastomoses were performed.  The first distal anastomosis was the distal RCA.  This was a 1.5 mm vessel.  There was a proximal 70% stenosis.  Reverse saphenous vein was sewn end-to-side with running 7-0 Prolene with good flow through  the graft.  Cardioplegia was redosed.  A second distal anastomosis was the OM branch of the circumflex.  This had heavy calcification and a previously placed stent.  The vessel was 1.4 mm.  A reverse saphenous vein was sewn end-to-side with running 7-0 Prolene.  There was good flow through  the graft.  Cardioplegia was redosed.  The third distal anastomosis was the ramus intermedius branch of the left coronary.  This had a proximal 90% stenosis.  Reverse saphenous vein was sewn end-to-side to this 1.5 mm vessel and there was good flow through the graft.  Cardioplegia was  redosed.  The fourth and final distal anastomosis was to the LAD.  This was heavily calcified proximally.  It was intramyocardial.  It was 1.3 mm vessel.  The left IMA pedicle was brought through an opening in the pericardium.  The distal IMA was too small for the  anastomosis and the internal mammary artery was cut more proximally.  The pedicle was skeletonized to increase the length of the mammary vessel.  The vessel was then sewn end-to-side to the LAD with a running 8-0 Prolene.  There was good flow through  the anastomosis after briefly releasing the pedicle bulldog and the mammary artery.  The bulldog was reapplied, and the pedicle secured to the epicardium.  Cardioplegia was redosed.  A cross clamp was still in place, 3 proximal anastomoses were placed on the ascending aorta using a 4.5 mm punch and running 6-0 Prolene.  Prior to tying down the final proximal anastomosis, air was  vented from the coronaries with a dose of retrograde  warm blood cardioplegia.  The crossclamp was removed.  The vein grafts were deaired and opened and each had good flow.  Hemostasis was documented at the proximal and distal sites.  The cardioplegia cannulas were placed.  The patient was rewarmed to reperfuse.  Temporary pacing wires were applied.  The lungs  reexpanded.  Ventilator was resumed.  The patient was then weaned from cardiopulmonary bypass on low dose milrinone and Neo-Synephrine.  Echo showed preserved LV function.  The protamine was administered without adverse reaction.  The patient remained  hemodynamically stable.  The postop EKG monitor showed ST elevation in the 2 demonstrated leads.  There were some abrasions of the epicardial fat and epicardium.  Due to the fragility of the heart during manipulation of the heart for exposure of the circumflex vessels with the  heart basket and without any regional wall motion abnormalities on echo, it was my feeling that the EKG changes were probably related to the epicardial tear which was fortunately not associated with significant bleeding.  The patient remained stable and the mediastinal fat was closed over the aorta and vein grafts.  Anterior mediastinal left pleural chest tubes were placed and brought out through separate incisions.  The sternum was closed with a wire.  The patient  remained stable.  The pectoralis fascia was closed with a running #1 Vicryl.  Subcutaneous and skin layers  were closed using running Vicryl and sterile dressings were applied.  Total cardiopulmonary bypass time was 132 minutes.  TN/NUANCE  D:09/24/2017 T:09/24/2017 JOB:[id number]

## 2017-09-24 NOTE — Progress Notes (Signed)
Patient interviewed in the preop area. Patient able to confirm name, DOB, procedure, allergies, npo status, metal in body (cardiac stent) and no pain. Patient husband and CRNA at bedside. Once arrived to OR patient able to move over to OR table with minimal assistance.   Leatha Gilding, RN

## 2017-09-25 ENCOUNTER — Inpatient Hospital Stay (HOSPITAL_COMMUNITY): Payer: Medicare Other

## 2017-09-25 LAB — BPAM FFP
Blood Product Expiration Date: 201909072359
ISSUE DATE / TIME: 201909061106
UNIT TYPE AND RH: 5100

## 2017-09-25 LAB — GLUCOSE, CAPILLARY
Glucose-Capillary: 100 mg/dL — ABNORMAL HIGH (ref 70–99)
Glucose-Capillary: 102 mg/dL — ABNORMAL HIGH (ref 70–99)
Glucose-Capillary: 103 mg/dL — ABNORMAL HIGH (ref 70–99)
Glucose-Capillary: 105 mg/dL — ABNORMAL HIGH (ref 70–99)
Glucose-Capillary: 106 mg/dL — ABNORMAL HIGH (ref 70–99)
Glucose-Capillary: 106 mg/dL — ABNORMAL HIGH (ref 70–99)
Glucose-Capillary: 107 mg/dL — ABNORMAL HIGH (ref 70–99)
Glucose-Capillary: 108 mg/dL — ABNORMAL HIGH (ref 70–99)
Glucose-Capillary: 108 mg/dL — ABNORMAL HIGH (ref 70–99)
Glucose-Capillary: 109 mg/dL — ABNORMAL HIGH (ref 70–99)
Glucose-Capillary: 110 mg/dL — ABNORMAL HIGH (ref 70–99)
Glucose-Capillary: 111 mg/dL — ABNORMAL HIGH (ref 70–99)
Glucose-Capillary: 112 mg/dL — ABNORMAL HIGH (ref 70–99)
Glucose-Capillary: 125 mg/dL — ABNORMAL HIGH (ref 70–99)
Glucose-Capillary: 75 mg/dL (ref 70–99)
Glucose-Capillary: 85 mg/dL (ref 70–99)

## 2017-09-25 LAB — BLOOD GAS, ARTERIAL
Acid-Base Excess: 2.8 mmol/L — ABNORMAL HIGH (ref 0.0–2.0)
Bicarbonate: 27.4 mmol/L (ref 20.0–28.0)
O2 Content: 6 L/min
O2 Saturation: 98.1 %
Patient temperature: 98.6
pCO2 arterial: 46.7 mmHg (ref 32.0–48.0)
pH, Arterial: 7.387 (ref 7.350–7.450)
pO2, Arterial: 106 mmHg (ref 83.0–108.0)

## 2017-09-25 LAB — POCT I-STAT, CHEM 8
BUN: <3 mg/dL — ABNORMAL LOW (ref 8–23)
Calcium, Ion: 1.18 mmol/L (ref 1.15–1.40)
Chloride: 91 mmol/L — ABNORMAL LOW (ref 98–111)
Creatinine, Ser: 0.8 mg/dL (ref 0.44–1.00)
Glucose, Bld: 122 mg/dL — ABNORMAL HIGH (ref 70–99)
HCT: 30 % — ABNORMAL LOW (ref 36.0–46.0)
Hemoglobin: 10.2 g/dL — ABNORMAL LOW (ref 12.0–15.0)
Potassium: 3.4 mmol/L — ABNORMAL LOW (ref 3.5–5.1)
Sodium: 133 mmol/L — ABNORMAL LOW (ref 135–145)
TCO2: 27 mmol/L (ref 22–32)

## 2017-09-25 LAB — BASIC METABOLIC PANEL
ANION GAP: 7 (ref 5–15)
BUN: <5 mg/dL — ABNORMAL LOW (ref 8–23)
CALCIUM: 7.9 mg/dL — AB (ref 8.9–10.3)
CO2: 26 mmol/L (ref 22–32)
Chloride: 102 mmol/L (ref 98–111)
Creatinine, Ser: 0.72 mg/dL (ref 0.44–1.00)
GFR calc Af Amer: >60 mL/min (ref >60)
GFR calc non Af Amer: >60 mL/min (ref >60)
GLUCOSE: 109 mg/dL — AB (ref 70–99)
Potassium: 3.3 mmol/L — ABNORMAL LOW (ref 3.5–5.1)
Sodium: 135 mmol/L (ref 135–145)

## 2017-09-25 LAB — CREATININE, SERUM
CREATININE: 0.83 mg/dL (ref 0.44–1.00)
GFR calc Af Amer: >60 mL/min (ref >60)

## 2017-09-25 LAB — CBC
HCT: 29.1 % — ABNORMAL LOW (ref 36.0–46.0)
HCT: 31.2 % — ABNORMAL LOW (ref 36.0–46.0)
HEMOGLOBIN: 10.4 g/dL — AB (ref 12.0–15.0)
Hemoglobin: 9.8 g/dL — ABNORMAL LOW (ref 12.0–15.0)
MCH: 31.3 pg (ref 26.0–34.0)
MCH: 31.4 pg (ref 26.0–34.0)
MCHC: 33.7 g/dL (ref 30.0–36.0)
MCHC: 33.3 g/dL (ref 30.0–36.0)
MCV: 93 fL (ref 78.0–100.0)
MCV: 94.3 fL (ref 78.0–100.0)
Platelets: 166 10*3/uL (ref 150–400)
Platelets: 169 10*3/uL (ref 150–400)
RBC: 3.13 MIL/uL — ABNORMAL LOW (ref 3.87–5.11)
RBC: 3.31 MIL/uL — ABNORMAL LOW (ref 3.87–5.11)
RDW: 14.6 % (ref 11.5–15.5)
RDW: 14.4 % (ref 11.5–15.5)
WBC: 12.6 10*3/uL — AB (ref 4.0–10.5)
WBC: 17.1 10*3/uL — ABNORMAL HIGH (ref 4.0–10.5)

## 2017-09-25 LAB — PREPARE FRESH FROZEN PLASMA: Unit division: 0

## 2017-09-25 LAB — MAGNESIUM
MAGNESIUM: 1.7 mg/dL (ref 1.7–2.4)
Magnesium: 2.2 mg/dL (ref 1.7–2.4)

## 2017-09-25 MED ORDER — ENOXAPARIN SODIUM 40 MG/0.4ML ~~LOC~~ SOLN
40.0000 mg | Freq: Every day | SUBCUTANEOUS | Status: DC
  Administered 2017-09-25 – 2017-09-28 (×4): 40 mg via SUBCUTANEOUS
  Filled 2017-09-25 (×4): qty 0.4

## 2017-09-25 MED ORDER — POTASSIUM CHLORIDE 10 MEQ/50ML IV SOLN
10.0000 meq | INTRAVENOUS | Status: AC
  Administered 2017-09-25 (×3): 10 meq via INTRAVENOUS
  Filled 2017-09-25 (×3): qty 50

## 2017-09-25 MED ORDER — POTASSIUM CHLORIDE CRYS ER 20 MEQ PO TBCR
20.0000 meq | EXTENDED_RELEASE_TABLET | Freq: Three times a day (TID) | ORAL | Status: AC
  Administered 2017-09-25 (×3): 20 meq via ORAL
  Filled 2017-09-25 (×3): qty 1

## 2017-09-25 MED ORDER — INSULIN ASPART 100 UNIT/ML ~~LOC~~ SOLN
0.0000 [IU] | Freq: Three times a day (TID) | SUBCUTANEOUS | Status: DC
  Administered 2017-09-25: 2 [IU] via SUBCUTANEOUS

## 2017-09-25 MED ORDER — TRAMADOL HCL 50 MG PO TABS
50.0000 mg | ORAL_TABLET | ORAL | Status: DC | PRN
  Administered 2017-09-27 – 2017-10-06 (×8): 50 mg via ORAL
  Filled 2017-09-25 (×8): qty 1

## 2017-09-25 MED ORDER — INSULIN DETEMIR 100 UNIT/ML ~~LOC~~ SOLN
15.0000 [IU] | Freq: Once | SUBCUTANEOUS | Status: AC
  Administered 2017-09-25: 15 [IU] via SUBCUTANEOUS
  Filled 2017-09-25: qty 0.15

## 2017-09-25 MED ORDER — FUROSEMIDE 10 MG/ML IJ SOLN
40.0000 mg | Freq: Once | INTRAMUSCULAR | Status: AC
  Administered 2017-09-25: 40 mg via INTRAVENOUS
  Filled 2017-09-25: qty 4

## 2017-09-25 MED ORDER — POTASSIUM CHLORIDE CRYS ER 20 MEQ PO TBCR
20.0000 meq | EXTENDED_RELEASE_TABLET | Freq: Once | ORAL | Status: AC
  Administered 2017-09-25: 20 meq via ORAL
  Filled 2017-09-25: qty 1

## 2017-09-25 NOTE — Progress Notes (Signed)
1 Day Post-Op Procedure(s) (LRB): CORONARY ARTERY BYPASS GRAFTING (CABG) x4 using mammary artery and saphenous vein. LIMA to LAD, SVG to RCA, SVG to RAMUS, SVT to OM. Endoscopic saphenous vein harvest. (N/A) TRANSESOPHAGEAL ECHOCARDIOGRAM (TEE) (N/A) Subjective: No complaints  Objective: Vital signs in last 24 hours: Temp:  [95 F (35 C)-99.5 F (37.5 C)] 99.5 F (37.5 C) (09/07 0807) Pulse Rate:  [72-87] 75 (09/07 0807) Cardiac Rhythm: Normal sinus rhythm (09/07 0730) Resp:  [8-22] 11 (09/07 0807) BP: (96-139)/(48-98) 104/51 (09/07 0500) SpO2:  [91 %-100 %] 97 % (09/07 0807) Arterial Line BP: (105-146)/(51-70) 127/51 (09/07 0807) FiO2 (%):  [40 %-50 %] 40 % (09/06 2300) Weight:  [60.2 kg] 60.2 kg (09/07 0530)  Hemodynamic parameters for last 24 hours: PAP: (21-37)/(6-19) 24/8 CO:  [3.1 L/min-3.9 L/min] 3.5 L/min CI:  [2 L/min/m2-2.5 L/min/m2] 2.3 L/min/m2  Intake/Output from previous day: 09/06 0701 - 09/07 0700 In: 6099.3 [I.V.:3328.7; Blood:710; IV Piggyback:2060.6] Out: 8341 [Urine:4725; Blood:1000; Chest Tube:686] Intake/Output this shift: Total I/O In: 82.1 [I.V.:35.3; IV Piggyback:46.9] Out: 100 [Urine:50; Chest Tube:50]  General appearance: alert and appropriate Neurologic: intact Heart: regular rate and rhythm, S1, S2 normal, no murmur, click, rub or gallop Lungs: few rales and audible air leak on left Extremities: edema mild Wound: dressings dry Moderate air leak from chest tubes.  Lab Results: Recent Labs    09/24/17 2003 09/25/17 0404  WBC 14.1* 12.6*  HGB 10.0* 9.8*  HCT 29.6* 29.1*  PLT 156 166   BMET:  Recent Labs    09/24/17 2002 09/24/17 2003 09/25/17 0404  NA 136  --  135  K 3.9  --  3.3*  CL 101  --  102  CO2  --   --  26  GLUCOSE 177*  --  109*  BUN 3*  --  <5*  CREATININE 0.70 0.81 0.72  CALCIUM  --   --  7.9*    PT/INR:  Recent Labs    09/24/17 1352  LABPROT 16.9*  INR 1.39   ABG    Component Value Date/Time   PHART  7.387 09/25/2017 0350   HCO3 27.4 09/25/2017 0350   TCO2 28 09/24/2017 2134   ACIDBASEDEF 1.0 06/21/2015 1259   O2SAT 98.1 09/25/2017 0350   CBG (last 3)  Recent Labs    09/25/17 0407 09/25/17 0515 09/25/17 0700  GLUCAP 106* 110* 111*   CXR: clear  ECG: NSR, non-specific changes  Assessment/Plan: S/P Procedure(s) (LRB): CORONARY ARTERY BYPASS GRAFTING (CABG) x4 using mammary artery and saphenous vein. LIMA to LAD, SVG to RCA, SVG to RAMUS, SVT to OM. Endoscopic saphenous vein harvest. (N/A) TRANSESOPHAGEAL ECHOCARDIOGRAM (TEE) (N/A)  POD 1 Hemodynamically stable in sinus rhythm. Preop EF normal. DC milrinone. Continue low dose beta blocker.  DC swan and arterial line.  Continue chest tubes to suction with air leak. Preop CT chest showed 2 cm LUL nodule suspicious for bronchogenic cancer although has fairly smooth contours that will require further workup later. 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  Continue IS, OOB.  Volume excess: will diurese and replace K+  DM: glucose under control on low dose insulin drip. Hgb A1C was 5.8 preop on no meds. Will give one dose of Levemir this am and DC drip. Follow CBG's today.   LOS: 1 day    Rhonda Hughes 09/25/2017

## 2017-09-25 NOTE — Progress Notes (Signed)
Patient ID: MCKINZEE SPIRITO, female   DOB: 1940/10/16, 77 y.o.   MRN: [id number] TCTS Evening Rounds:  Hemodynamically stable in sinus rhythm Diuresed well. Replacing K+ Up in chair.  BMET    Component Value Date/Time   NA 133 (L) 09/25/2017 1724   NA 133 (L) 09/13/2017 1623   K 3.4 (L) 09/25/2017 1724   K 3.8 06/23/2010 1140   CL 91 (L) 09/25/2017 1724   CO2 26 09/25/2017 0404   GLUCOSE 122 (H) 09/25/2017 1724   BUN <3 (L) 09/25/2017 1724   BUN 5 (L) 09/13/2017 1623   CREATININE 0.80 09/25/2017 1724   CREATININE 0.95 (H) 06/14/2015 1531   CALCIUM 7.9 (L) 09/25/2017 0404   GFRNONAA >60 09/25/2017 0404   GFRAA >60 09/25/2017 0404   CBC    Component Value Date/Time   WBC 17.1 (H) 09/25/2017 1700   RBC 3.31 (L) 09/25/2017 1700   HGB 10.2 (L) 09/25/2017 1724   HGB 12.9 09/13/2017 1623   HCT 30.0 (L) 09/25/2017 1724   HCT 37.5 09/13/2017 1623   HCT 36 06/23/2010 1138   PLT 169 09/25/2017 1700   PLT 312 09/13/2017 1623   MCV 94.3 09/25/2017 1700   MCV 94 09/13/2017 1623   MCH 31.4 09/25/2017 1700   MCHC 33.3 09/25/2017 1700   RDW 14.4 09/25/2017 1700   RDW 12.2 (L) 09/13/2017 1623   LYMPHSABS 1.0 05/16/2017 0624   MONOABS 0.7 05/16/2017 0624   EOSABS 0.1 05/16/2017 0624   BASOSABS 0.0 05/16/2017 5573

## 2017-09-26 ENCOUNTER — Encounter (HOSPITAL_COMMUNITY): Payer: Self-pay

## 2017-09-26 ENCOUNTER — Inpatient Hospital Stay (HOSPITAL_COMMUNITY): Payer: Medicare Other

## 2017-09-26 ENCOUNTER — Other Ambulatory Visit: Payer: Self-pay

## 2017-09-26 LAB — GLUCOSE, CAPILLARY: Glucose-Capillary: 129 mg/dL — ABNORMAL HIGH (ref 70–99)

## 2017-09-26 LAB — POCT I-STAT 4, (NA,K, GLUC, HGB,HCT)
Glucose, Bld: 123 mg/dL — ABNORMAL HIGH (ref 70–99)
HCT: 33 % — ABNORMAL LOW (ref 36.0–46.0)
Hemoglobin: 11.2 g/dL — ABNORMAL LOW (ref 12.0–15.0)
Potassium: 3.3 mmol/L — ABNORMAL LOW (ref 3.5–5.1)
Sodium: 140 mmol/L (ref 135–145)

## 2017-09-26 LAB — BASIC METABOLIC PANEL
Anion gap: 7 (ref 5–15)
CO2: 31 mmol/L (ref 22–32)
CREATININE: 0.69 mg/dL (ref 0.44–1.00)
Calcium: 8.3 mg/dL — ABNORMAL LOW (ref 8.9–10.3)
Chloride: 96 mmol/L — ABNORMAL LOW (ref 98–111)
GFR calc Af Amer: >60 mL/min (ref >60)
GFR calc non Af Amer: >60 mL/min (ref >60)
Glucose, Bld: 98 mg/dL (ref 70–99)
Potassium: 4 mmol/L (ref 3.5–5.1)
SODIUM: 134 mmol/L — AB (ref 135–145)

## 2017-09-26 LAB — CBC
HCT: 28.7 % — ABNORMAL LOW (ref 36.0–46.0)
Hemoglobin: 9.3 g/dL — ABNORMAL LOW (ref 12.0–15.0)
MCH: 31.1 pg (ref 26.0–34.0)
MCHC: 32.4 g/dL (ref 30.0–36.0)
MCV: 96 fL (ref 78.0–100.0)
PLATELETS: 147 10*3/uL — AB (ref 150–400)
RBC: 2.99 MIL/uL — ABNORMAL LOW (ref 3.87–5.11)
RDW: 14.1 % (ref 11.5–15.5)
WBC: 15.4 10*3/uL — ABNORMAL HIGH (ref 4.0–10.5)

## 2017-09-26 MED ORDER — POTASSIUM CHLORIDE CRYS ER 20 MEQ PO TBCR
20.0000 meq | EXTENDED_RELEASE_TABLET | Freq: Three times a day (TID) | ORAL | Status: AC
  Administered 2017-09-26 (×3): 20 meq via ORAL
  Filled 2017-09-26 (×3): qty 1

## 2017-09-26 MED ORDER — ZOLPIDEM TARTRATE 5 MG PO TABS: 5.0000 mg | ORAL_TABLET | Freq: Every evening | ORAL | Status: DC | PRN

## 2017-09-26 MED ORDER — ALPRAZOLAM 0.25 MG PO TABS
0.2500 mg | ORAL_TABLET | Freq: Two times a day (BID) | ORAL | Status: DC | PRN
  Administered 2017-09-26 – 2017-10-08 (×15): 0.25 mg via ORAL
  Filled 2017-09-26 (×15): qty 1

## 2017-09-26 MED ORDER — FUROSEMIDE 10 MG/ML IJ SOLN
40.0000 mg | Freq: Once | INTRAMUSCULAR | Status: AC
  Administered 2017-09-26: 40 mg via INTRAVENOUS
  Filled 2017-09-26: qty 4

## 2017-09-26 NOTE — Progress Notes (Signed)
Patient ID: Rhonda Hughes, female   DOB: June 26, 1940, 77 y.o.   MRN: [id number] TCTS Evening Rounds:  Hemodynamically stable in sinus rhythm.  Diuresing well  Still has air leak from chest tubes.

## 2017-09-26 NOTE — Progress Notes (Signed)
Spoke with Melissa RN re d/c CVC order.  RN aware.

## 2017-09-26 NOTE — Progress Notes (Signed)
Neuro intact, denies pain. Map >65, nsr with occ pvc. ls dim, lasix given. k repleted. Remains on 3l nasal cannula, dips to 80's on room air. Wires grounded x4, mct serosang out, 130 total this shift. +insp/exp air leak, no palpable crepitus. Dressing dry/intact. Chest/ harvest sites WNL. +pulses, minimal edema. +bowel sounds, zofran x1 for nausea, resolved, BM x2. tol cardiac diet. Foley dc, + void without issue >1L. Up to chair for meals, ambulated 3 laps in halls. Husband at bedside.

## 2017-09-26 NOTE — Progress Notes (Signed)
2 Days Post-Op Procedure(s) (LRB): CORONARY ARTERY BYPASS GRAFTING (CABG) x4 using mammary artery and saphenous vein. LIMA to LAD, SVG to RCA, SVG to RAMUS, SVT to OM. Endoscopic saphenous vein harvest. (N/A) TRANSESOPHAGEAL ECHOCARDIOGRAM (TEE) (N/A) Subjective: No complaints. Ambulated this am.  Objective: Vital signs in last 24 hours: Temp:  [97.9 F (36.6 C)-99.7 F (37.6 C)] 97.9 F (36.6 C) (09/08 0700) Pulse Rate:  [64-79] 72 (09/08 0630) Cardiac Rhythm: Normal sinus rhythm (09/08 0630) Resp:  [12-25] 19 (09/08 0630) BP: (92-141)/(44-73) 120/55 (09/08 0630) SpO2:  [91 %-99 %] 95 % (09/08 0630) Arterial Line BP: (132-158)/(53-71) 134/60 (09/07 1245) Weight:  [58.4 kg] 58.4 kg (09/08 0500)  Hemodynamic parameters for last 24 hours: PAP: (29-30)/(9-13) 30/13  Intake/Output from previous day: 09/07 0701 - 09/08 0700 In: 827.6 [I.V.:525.5; IV Piggyback:302.1] Out: 2840 [Urine:2350; Chest Tube:490] Intake/Output this shift: No intake/output data recorded.  General appearance: alert and cooperative Heart: regular rate and rhythm, S1, S2 normal, no murmur, click, rub or gallop Lungs: clear breath sounds with audible air leak Extremities: edema mild Wound: incisions ok moderate air leak from chest tubes.  Lab Results: Recent Labs    09/25/17 1700 09/25/17 1724 09/26/17 0423  WBC 17.1*  --  15.4*  HGB 10.4* 10.2* 9.3*  HCT 31.2* 30.0* 28.7*  PLT 169  --  147*   BMET:  Recent Labs    09/25/17 0404  09/25/17 1724 09/26/17 0423  NA 135  --  133* 134*  K 3.3*  --  3.4* 4.0  CL 102  --  91* 96*  CO2 26  --   --  31  GLUCOSE 109*  --  122* 98  BUN <5*  --  <3* <5*  CREATININE 0.72   < > 0.80 0.69  CALCIUM 7.9*  --   --  8.3*   < > = values in this interval not displayed.    PT/INR:  Recent Labs    09/24/17 1352  LABPROT 16.9*  INR 1.39   ABG    Component Value Date/Time   PHART 7.387 09/25/2017 0350   HCO3 27.4 09/25/2017 0350   TCO2 27 09/25/2017  1724   ACIDBASEDEF 1.0 06/21/2015 1259   O2SAT 98.1 09/25/2017 0350   CBG (last 3)  Recent Labs    09/25/17 1358 09/25/17 2114 09/26/17 0658  GLUCAP 75 85 129*   CLINICAL DATA:  History of CABG  EXAM: PORTABLE CHEST 1 VIEW  COMPARISON:  September 25, 2016  FINDINGS: The PA catheter is been removed. A right IJ sheath remains. The left chest tube remains as well. No pneumothorax. Stable cardiomediastinal silhouette. Persistent small effusion and atelectasis on the left. Mild interstitial opacity on the left may represent mild asymmetric pulmonary venous congestion, unchanged. No other interval change.  IMPRESSION: 1. Support apparatus as above. 2. Possible mild pulmonary venous congestion, asymmetric to the left. 3. Tiny left effusion with underlying atelectasis.   Electronically Signed   By: Dorise Bullion III M.D   On: 09/26/2017 07:55  Assessment/Plan: S/P Procedure(s) (LRB): CORONARY ARTERY BYPASS GRAFTING (CABG) x4 using mammary artery and saphenous vein. LIMA to LAD, SVG to RCA, SVG to RAMUS, SVT to OM. Endoscopic saphenous vein harvest. (N/A) TRANSESOPHAGEAL ECHOCARDIOGRAM (TEE) (N/A)  POD 2 Hemodynamically stable in sinus rhythm. Continue Lopressor  Chest tubes still draining and there is persistent moderate air leak that I can hear when she breaths. Will keep chest tubes in and turn suction down to 10 cm.  Continue IS, ambulation  DC sleeve, foley  Diuresed well yesterday. Will give another dose of lasix this am.  Glucose under good control. DC CBG's.   LOS: 2 days    Gaye Pollack 09/26/2017

## 2017-09-27 ENCOUNTER — Inpatient Hospital Stay (HOSPITAL_COMMUNITY): Payer: Medicare Other

## 2017-09-27 ENCOUNTER — Encounter (HOSPITAL_COMMUNITY): Payer: Self-pay | Admitting: Cardiothoracic Surgery

## 2017-09-27 LAB — BASIC METABOLIC PANEL
ANION GAP: 9 (ref 5–15)
BUN: 7 mg/dL — ABNORMAL LOW (ref 8–23)
CALCIUM: 8.3 mg/dL — AB (ref 8.9–10.3)
CO2: 29 mmol/L (ref 22–32)
Chloride: 95 mmol/L — ABNORMAL LOW (ref 98–111)
Creatinine, Ser: 0.67 mg/dL (ref 0.44–1.00)
GFR calc non Af Amer: >60 mL/min (ref >60)
GLUCOSE: 99 mg/dL (ref 70–99)
Potassium: 4 mmol/L (ref 3.5–5.1)
Sodium: 133 mmol/L — ABNORMAL LOW (ref 135–145)

## 2017-09-27 LAB — CBC
HCT: 27.8 % — ABNORMAL LOW (ref 36.0–46.0)
HEMOGLOBIN: 9.1 g/dL — AB (ref 12.0–15.0)
MCH: 31.4 pg (ref 26.0–34.0)
MCHC: 32.7 g/dL (ref 30.0–36.0)
MCV: 95.9 fL (ref 78.0–100.0)
PLATELETS: 170 10*3/uL (ref 150–400)
RBC: 2.9 MIL/uL — ABNORMAL LOW (ref 3.87–5.11)
RDW: 13.5 % (ref 11.5–15.5)
WBC: 14.9 10*3/uL — ABNORMAL HIGH (ref 4.0–10.5)

## 2017-09-27 MED ORDER — LIDOCAINE HCL (PF) 1 % IJ SOLN
INTRAMUSCULAR | Status: AC
  Filled 2017-09-27: qty 30

## 2017-09-27 MED ORDER — ENSURE ENLIVE PO LIQD
237.0000 mL | Freq: Three times a day (TID) | ORAL | Status: DC
  Administered 2017-09-27 – 2017-10-06 (×4): 237 mL via ORAL

## 2017-09-27 NOTE — Progress Notes (Addendum)
    [address]       [place] [address]             [phone]      3 Days Post-Op Procedure(s) (LRB): CORONARY ARTERY BYPASS GRAFTING (CABG) x4 using mammary artery and saphenous vein. LIMA to LAD, SVG to RCA, SVG to RAMUS, SVT to OM. Endoscopic saphenous vein harvest. (N/A) TRANSESOPHAGEAL ECHOCARDIOGRAM (TEE) (N/A) Subjective: Feels pretty well, no specific c/o  Objective: Vital signs in last 24 hours: Temp:  [98.3 F (36.8 C)-98.5 F (36.9 C)] 98.5 F (36.9 C) (09/09 0726) Pulse Rate:  [67-77] 77 (09/09 0726) Cardiac Rhythm: Normal sinus rhythm (09/09 0726) Resp:  [14-27] 22 (09/09 0726) BP: (80-136)/(34-81) 92/46 (09/09 0726) SpO2:  [94 %-97 %] 97 % (09/09 0726) Weight:  [59.1 kg] 59.1 kg (09/09 0600)  Hemodynamic parameters for last 24 hours:    Intake/Output from previous day: 09/08 0701 - 09/09 0700 In: 1023 [P.O.:1020; I.V.:3] Out: 1790 [Urine:1660; Chest Tube:130] Intake/Output this shift: No intake/output data recorded.  General appearance: alert, cooperative and no distress Heart: regular rate and rhythm Lungs: clear to auscultation bilaterally and some sub-q air Abdomen: benign exam Extremities: no edema Wound: incis healing well  Lab Results: Recent Labs    09/26/17 0423 09/27/17 0248  WBC 15.4* 14.9*  HGB 9.3* 9.1*  HCT 28.7* 27.8*  PLT 147* 170   BMET:  Recent Labs    09/26/17 0423 09/27/17 0248  NA 134* 133*  K 4.0 4.0  CL 96* 95*  CO2 31 29  GLUCOSE 98 99  BUN <5* 7*  CREATININE 0.69 0.67  CALCIUM 8.3* 8.3*    PT/INR:  Recent Labs    09/24/17 1352  LABPROT 16.9*  INR 1.39   ABG    Component Value Date/Time   PHART 7.387 09/25/2017 0350   HCO3 27.4 09/25/2017 0350   TCO2 27 09/25/2017 1724   ACIDBASEDEF 1.0 06/21/2015 1259   O2SAT 98.1 09/25/2017 0350   CBG (last 3)  Recent Labs    09/25/17 1358 09/25/17 2114 09/26/17 0658  GLUCAP 75 85 129*    Meds Scheduled Meds: . acetaminophen   1,000 mg Oral Q6H   Or  . acetaminophen (TYLENOL) oral liquid 160 mg/5 mL  1,000 mg Per Tube Q6H  . aspirin EC  325 mg Oral Daily   Or  . aspirin  324 mg Per Tube Daily  . atorvastatin  20 mg Oral QHS  . bisacodyl  10 mg Oral Daily   Or  . bisacodyl  10 mg Rectal Daily  . chlorhexidine gluconate (MEDLINE KIT)  15 mL Mouth Rinse BID  . Chlorhexidine Gluconate Cloth  6 each Topical Daily  . Chlorhexidine Gluconate Cloth  6 each Topical Q0600  . docusate sodium  200 mg Oral Daily  . enoxaparin (LOVENOX) injection  40 mg Subcutaneous QHS  . mouth rinse  15 mL Mouth Rinse BID  . memantine  10 mg Oral BID  . metoprolol tartrate  12.5 mg Oral BID   Or  . metoprolol tartrate  12.5 mg Per Tube BID  . pantoprazole  40 mg Oral Daily  . sodium chloride flush  10-40 mL Intracatheter Q12H  . sodium chloride flush  3 mL Intravenous Q12H  . venlafaxine XR  37.5 mg Oral QHS   Continuous Infusions: PRN Meds:.ALPRAZolam, metoprolol tartrate, morphine injection, ondansetron (ZOFRAN) IV, oxyCODONE, sodium chloride flush, sodium chloride flush, traMADol, zolpidem  Xrays Dg Chest Port 1   View  Result Date: 09/27/2017 CLINICAL DATA:  77-year-old female post CABG.  Subsequent encounter. EXAM: PORTABLE CHEST 1 VIEW COMPARISON:  09/26/2017. FINDINGS: Post CABG. Heart slightly enlarged. Small left-sided pleural effusion and left base subsegmental atelectasis. Left-sided chest tube and mediastinal drain remain in place. Interval development of subcutaneous emphysema lower neck, left supraclavicular region and left lateral chest wall. This may reflect changes of pneumomediastinum dissecting superiorly. Difficult to completely exclude tiny left apical pneumothorax although I suspect this represents overlying subcutaneous gas. Calcified aorta. Removal right introducer catheter. Epicardial leads remain in place. No acute osseous abnormality. IMPRESSION: 1. Post CABG. Heart slightly enlarged. Small left-sided pleural  effusion and left base subsegmental atelectasis. 2. Left-sided chest tube and mediastinal drain remain in place. Interval development of subcutaneous emphysema lower neck, left supraclavicular region and left lateral chest wall. This may reflect changes of pneumomediastinum dissecting superiorly. Difficult to completely exclude tiny left apical pneumothorax although I suspect this represents overlying subcutaneous gas. 3.  Aortic Atherosclerosis (ICD10-I70.0). These results will be called to the ordering clinician or representative by the Radiologist Assistant, and communication documented in the PACS or zVision Dashboard. Electronically Signed   By: Steven  Olson M.D.   On: 09/27/2017 08:18   Dg Chest Port 1 View  Result Date: 09/26/2017 CLINICAL DATA:  History of CABG EXAM: PORTABLE CHEST 1 VIEW COMPARISON:  September 25, 2016 FINDINGS: The PA catheter is been removed. A right IJ sheath remains. The left chest tube remains as well. No pneumothorax. Stable cardiomediastinal silhouette. Persistent small effusion and atelectasis on the left. Mild interstitial opacity on the left may represent mild asymmetric pulmonary venous congestion, unchanged. No other interval change. IMPRESSION: 1. Support apparatus as above. 2. Possible mild pulmonary venous congestion, asymmetric to the left. 3. Tiny left effusion with underlying atelectasis. Electronically Signed   By: David  Williams III M.D   On: 09/26/2017 07:55    Assessment/Plan: S/P Procedure(s) (LRB): CORONARY ARTERY BYPASS GRAFTING (CABG) x4 using mammary artery and saphenous vein. LIMA to LAD, SVG to RCA, SVG to RAMUS, SVT to OM. Endoscopic saphenous vein harvest. (N/A) TRANSESOPHAGEAL ECHOCARDIOGRAM (TEE) (N/A) 1 doing well 2 conts with small air leak- leave CT in place on current suction, sats good on 1 liter [place] 3 hemodyn stable in sinus with some PVC's, BP to low currently for ACE-I/ARB 4 BS control is adequate, preop HG A1C is 5.8on no meds 5 dose  not appear to have much volume overload after diuresis 6 leukocytosis trend is improving, no fevers 7 H/H is pretty stable- monitor 8 renal fxn is stable with todays BUN/Cr 7/0.67 9 push routine pulm toilet and cardiac rehab  LOS: 3 days    Wayne E Gold PA-C 09/27/2017 Pager [phone]  persistent airleak from both tubes 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  Van Trigt III 09/27/2017   

## 2017-09-27 NOTE — Progress Notes (Signed)
Patient ID: Rhonda Hughes, female   DOB: April 23, 1940, 77 y.o.   MRN: [id number] EVENING ROUNDS NOTE :     [address]       [place], [address]             [phone]                 3 Days Post-Op Procedure(s) (LRB): CORONARY ARTERY BYPASS GRAFTING (CABG) x4 using mammary artery and saphenous vein. LIMA to LAD, SVG to RCA, SVG to RAMUS, SVT to OM. Endoscopic saphenous vein harvest. (N/A) TRANSESOPHAGEAL ECHOCARDIOGRAM (TEE) (N/A)  Total Length of Stay:  LOS: 3 days  BP (!) 125/49 (BP Location: Left Arm)   Pulse 80   Temp 98.2 F (36.8 C) (Oral)   Resp 17   Ht 5\' 2"  (1.575 m)   Wt 59.1 kg   SpO2 96%   BMI 23.81 kg/m   .Intake/Output      09/09 0701 - 09/10 0700   P.O. 480   I.V. (mL/kg)    Total Intake(mL/kg) 480 (8.1)   Urine (mL/kg/hr) 700 (1)   Stool    Chest Tube 100   Total Output 800   Net -320            Lab Results  Component Value Date   WBC 14.9 (H) 09/27/2017   HGB 9.1 (L) 09/27/2017   HCT 27.8 (L) 09/27/2017   PLT 170 09/27/2017   GLUCOSE 99 09/27/2017   ALT 19 09/21/2017   AST 23 09/21/2017   NA 133 (L) 09/27/2017   K 4.0 09/27/2017   CL 95 (L) 09/27/2017   CREATININE 0.67 09/27/2017   BUN 7 (L) 09/27/2017   CO2 29 09/27/2017   TSH 0.467 04/24/2015   INR 1.39 09/24/2017   HGBA1C 5.8 (H) 09/17/2017   Sq air stabilized after changing position of chest tube +1 air leak from the left   Grace Isaac MD  Beeper [phone] Office [phone] 09/27/2017 7:24 PM

## 2017-09-28 ENCOUNTER — Inpatient Hospital Stay (HOSPITAL_COMMUNITY): Payer: Medicare Other

## 2017-09-28 MED ORDER — SODIUM CHLORIDE 0.9 % IV SOLN
1.0000 g | Freq: Three times a day (TID) | INTRAVENOUS | Status: AC
  Administered 2017-09-28 – 2017-10-05 (×23): 1 g via INTRAVENOUS
  Filled 2017-09-28 (×26): qty 1

## 2017-09-28 MED FILL — Heparin Sodium (Porcine) Inj 1000 Unit/ML: INTRAMUSCULAR | Qty: 30 | Status: AC

## 2017-09-28 MED FILL — Magnesium Sulfate Inj 50%: INTRAMUSCULAR | Qty: 10 | Status: AC

## 2017-09-28 MED FILL — Potassium Chloride Inj 2 mEq/ML: INTRAVENOUS | Qty: 40 | Status: AC

## 2017-09-28 NOTE — Progress Notes (Signed)
CT surgery p.m. Rounds  Patient ambulated today Subcutaneous air left side of face and neck has improved Chest x-ray shows a left chest tube in good position Continue chest tube to waterseal

## 2017-09-28 NOTE — Progress Notes (Addendum)
[address]       [place] [address]             [phone]      4 Days Post-Op Procedure(s) (LRB): CORONARY ARTERY BYPASS GRAFTING (CABG) x4 using mammary artery and saphenous vein. LIMA to LAD, SVG to RCA, SVG to RAMUS, SVT to OM. Endoscopic saphenous vein harvest. (N/A) TRANSESOPHAGEAL ECHOCARDIOGRAM (TEE) (N/A) Subjective: Feels ok, increased sub q air into face, + air leak, CXR is pending  Objective: Vital signs in last 24 hours: Temp:  [98.2 F (36.8 C)-98.9 F (37.2 C)] 98.7 F (37.1 C) (09/10 0400) Pulse Rate:  [66-85] 81 (09/10 0700) Cardiac Rhythm: Normal sinus rhythm (09/10 0400) Resp:  [15-38] 17 (09/10 0700) BP: (77-135)/(45-73) 131/59 (09/10 0700) SpO2:  [88 %-100 %] 98 % (09/10 0700) Weight:  [56.4 kg] 56.4 kg (09/10 0600)  Hemodynamic parameters for last 24 hours:    Intake/Output from previous day: 09/09 0701 - 09/10 0700 In: 480 [P.O.:480] Out: 1510 [Urine:1350; Chest Tube:160] Intake/Output this shift: No intake/output data recorded.  General appearance: alert, cooperative and no distress Heart: regular rate and rhythm and occ extrasystole Lungs: fair air exchange throughout Abdomen: soft, nontender Extremities: right >left LE edema Wound: incis healing well, some echymosis  Lab Results: Recent Labs    09/26/17 0423 09/27/17 0248  WBC 15.4* 14.9*  HGB 9.3* 9.1*  HCT 28.7* 27.8*  PLT 147* 170   BMET:  Recent Labs    09/26/17 0423 09/27/17 0248  NA 134* 133*  K 4.0 4.0  CL 96* 95*  CO2 31 29  GLUCOSE 98 99  BUN <5* 7*  CREATININE 0.69 0.67  CALCIUM 8.3* 8.3*    PT/INR: No results for input(s): LABPROT, INR in the last 72 hours. ABG    Component Value Date/Time   PHART 7.387 09/25/2017 0350   HCO3 27.4 09/25/2017 0350   TCO2 27 09/25/2017 1724   ACIDBASEDEF 1.0 06/21/2015 1259   O2SAT 98.1 09/25/2017 0350   CBG (last 3)  Recent Labs    09/25/17 1358 09/25/17 2114 09/26/17 0658  GLUCAP 75 85  129*    Meds Scheduled Meds: . acetaminophen  1,000 mg Oral Q6H   Or  . acetaminophen (TYLENOL) oral liquid 160 mg/5 mL  1,000 mg Per Tube Q6H  . aspirin EC  325 mg Oral Daily   Or  . aspirin  324 mg Per Tube Daily  . atorvastatin  20 mg Oral QHS  . bisacodyl  10 mg Oral Daily   Or  . bisacodyl  10 mg Rectal Daily  . chlorhexidine gluconate (MEDLINE KIT)  15 mL Mouth Rinse BID  . Chlorhexidine Gluconate Cloth  6 each Topical Daily  . Chlorhexidine Gluconate Cloth  6 each Topical Q0600  . docusate sodium  200 mg Oral Daily  . enoxaparin (LOVENOX) injection  40 mg Subcutaneous QHS  . feeding supplement (ENSURE ENLIVE)  237 mL Oral TID WC  . mouth rinse  15 mL Mouth Rinse BID  . memantine  10 mg Oral BID  . metoprolol tartrate  12.5 mg Oral BID   Or  . metoprolol tartrate  12.5 mg Per Tube BID  . pantoprazole  40 mg Oral Daily  . sodium chloride flush  10-40 mL Intracatheter Q12H  . sodium chloride flush  3 mL Intravenous Q12H  . venlafaxine XR  37.5 mg Oral QHS   Continuous Infusions: PRN Meds:.ALPRAZolam, metoprolol tartrate, morphine injection, ondansetron (  ZOFRAN) IV, oxyCODONE, sodium chloride flush, sodium chloride flush, traMADol, zolpidem  Xrays Dg Chest Port 1 View  Result Date: 09/27/2017 CLINICAL DATA:  Left-sided face swelling post op coronary bypass grafting EXAM: PORTABLE CHEST 1 VIEW COMPARISON:  09/27/2017, 09/26/2017, 09/25/2017 FINDINGS: Postsurgical changes of the mediastinum. Left-sided chest tube with slightly curved appearance of the distal tubing at the left apex. Small left-sided pleural effusion with basilar atelectasis. Aortic atherosclerosis. Moderate emphysema within the left chest wall and left neck. IMPRESSION: 1. Small left pleural effusion and basilar atelectasis without significant change. 2. Continued moderate left chest wall and supraclavicular soft tissue emphysema. Electronically Signed   By: Donavan Foil M.D.   On: 09/27/2017 18:56   Dg Chest  Port 1 View  Result Date: 09/27/2017 CLINICAL DATA:  Chest tube. EXAM: PORTABLE CHEST 1 VIEW COMPARISON:  Chest x-ray from same day at 4:57 a.m. FINDINGS: Interval removal of the mediastinal drain. Unchanged left-sided chest tube. Stable cardiomediastinal silhouette status post CABG. Unchanged small left pleural effusion and left lower lobe subsegmental atelectasis. No definite pneumothorax. Unchanged subcutaneous emphysema in the lower neck and left chest wall. No acute osseous abnormality. IMPRESSION: 1. Unchanged left-sided chest tube and subcutaneous emphysema in the lower neck and left chest wall. No definite pneumothorax. 2. Interval removal of the mediastinal drain. 3. Unchanged small left pleural effusion. Electronically Signed   By: Titus Dubin M.D.   On: 09/27/2017 12:05   Dg Chest Port 1 View  Result Date: 09/27/2017 CLINICAL DATA:  77 year old female post CABG.  Subsequent encounter. EXAM: PORTABLE CHEST 1 VIEW COMPARISON:  09/26/2017. FINDINGS: Post CABG. Heart slightly enlarged. Small left-sided pleural effusion and left base subsegmental atelectasis. Left-sided chest tube and mediastinal drain remain in place. Interval development of subcutaneous emphysema lower neck, left supraclavicular region and left lateral chest wall. This may reflect changes of pneumomediastinum dissecting superiorly. Difficult to completely exclude tiny left apical pneumothorax although I suspect this represents overlying subcutaneous gas. Calcified aorta. Removal right introducer catheter. Epicardial leads remain in place. No acute osseous abnormality. IMPRESSION: 1. Post CABG. Heart slightly enlarged. Small left-sided pleural effusion and left base subsegmental atelectasis. 2. Left-sided chest tube and mediastinal drain remain in place. Interval development of subcutaneous emphysema lower neck, left supraclavicular region and left lateral chest wall. This may reflect changes of pneumomediastinum dissecting  superiorly. Difficult to completely exclude tiny left apical pneumothorax although I suspect this represents overlying subcutaneous gas. 3.  Aortic Atherosclerosis (ICD10-I70.0). These results will be called to the ordering clinician or representative by the Radiologist Assistant, and communication documented in the PACS or zVision Dashboard. Electronically Signed   By: Genia Del M.D.   On: 09/27/2017 08:18    Assessment/Plan: S/P Procedure(s) (LRB): CORONARY ARTERY BYPASS GRAFTING (CABG) x4 using mammary artery and saphenous vein. LIMA to LAD, SVG to RCA, SVG to RAMUS, SVT to OM. Endoscopic saphenous vein harvest. (N/A) TRANSESOPHAGEAL ECHOCARDIOGRAM (TEE) (N/A)   1 stable hemodynamics, sinus with some PVC's 2 sats ok on 2 l [place], cont CT to suction , check CXR 3 no new labs 4 BS adeq control 5 cont routine pulm toilet and rehab modalities  LOS: 4 days    Rhonda Hughes 09/28/2017 Pager [phone]  Patient examined and today's portable chest x-ray image personally reviewed. Left chest tube is in good position.  Air leak remains persistent but mild.  Mild subcutaneous air noted.  Mild bilateral apical pneumothorax.  Cardiac status remains stable. Leave patient in ICU because of pulmonary  status, continue 2 L nasal cannula to help reabsorb the nitrogen in the soft tissues.  P Prescott Gum MD

## 2017-09-29 ENCOUNTER — Inpatient Hospital Stay (HOSPITAL_COMMUNITY): Payer: Medicare Other

## 2017-09-29 LAB — BASIC METABOLIC PANEL
Anion gap: 10 (ref 5–15)
BUN: <5 mg/dL — ABNORMAL LOW (ref 8–23)
CO2: 30 mmol/L (ref 22–32)
Calcium: 8.3 mg/dL — ABNORMAL LOW (ref 8.9–10.3)
Chloride: 96 mmol/L — ABNORMAL LOW (ref 98–111)
Creatinine, Ser: 0.61 mg/dL (ref 0.44–1.00)
GFR calc Af Amer: >60 mL/min (ref >60)
GFR calc non Af Amer: >60 mL/min (ref >60)
Glucose, Bld: 101 mg/dL — ABNORMAL HIGH (ref 70–99)
Potassium: 2.8 mmol/L — ABNORMAL LOW (ref 3.5–5.1)
Sodium: 136 mmol/L (ref 135–145)

## 2017-09-29 LAB — PROTIME-INR
INR: 1.03
PROTHROMBIN TIME: 13.4 s (ref 11.4–15.2)

## 2017-09-29 LAB — CBC
HCT: 29.3 % — ABNORMAL LOW (ref 36.0–46.0)
Hemoglobin: 9.4 g/dL — ABNORMAL LOW (ref 12.0–15.0)
MCH: 30.8 pg (ref 26.0–34.0)
MCHC: 32.1 g/dL (ref 30.0–36.0)
MCV: 96.1 fL (ref 78.0–100.0)
Platelets: 237 10*3/uL (ref 150–400)
RBC: 3.05 MIL/uL — ABNORMAL LOW (ref 3.87–5.11)
RDW: 13.2 % (ref 11.5–15.5)
WBC: 11 10*3/uL — ABNORMAL HIGH (ref 4.0–10.5)

## 2017-09-29 MED ORDER — POTASSIUM CHLORIDE CRYS ER 20 MEQ PO TBCR
40.0000 meq | EXTENDED_RELEASE_TABLET | Freq: Two times a day (BID) | ORAL | Status: AC
  Administered 2017-09-29 (×2): 40 meq via ORAL
  Filled 2017-09-29 (×2): qty 2

## 2017-09-29 MED ORDER — FENTANYL CITRATE (PF) 100 MCG/2ML IJ SOLN
INTRAMUSCULAR | Status: AC
  Filled 2017-09-29: qty 4

## 2017-09-29 MED ORDER — POTASSIUM CHLORIDE CRYS ER 20 MEQ PO TBCR
20.0000 meq | EXTENDED_RELEASE_TABLET | ORAL | Status: DC | PRN
  Administered 2017-09-29: 20 meq via ORAL
  Filled 2017-09-29: qty 1

## 2017-09-29 MED ORDER — MIDAZOLAM HCL 2 MG/2ML IJ SOLN
INTRAMUSCULAR | Status: AC
  Filled 2017-09-29: qty 4

## 2017-09-29 MED ORDER — MIDAZOLAM HCL 2 MG/2ML IJ SOLN
INTRAMUSCULAR | Status: AC | PRN
  Administered 2017-09-29 (×2): 1 mg via INTRAVENOUS

## 2017-09-29 MED ORDER — ENOXAPARIN SODIUM 40 MG/0.4ML ~~LOC~~ SOLN
40.0000 mg | Freq: Every day | SUBCUTANEOUS | Status: DC
  Administered 2017-09-30: 40 mg via SUBCUTANEOUS
  Filled 2017-09-29: qty 0.4

## 2017-09-29 MED ORDER — FENTANYL CITRATE (PF) 100 MCG/2ML IJ SOLN
INTRAMUSCULAR | Status: AC | PRN
  Administered 2017-09-29 (×2): 25 ug via INTRAVENOUS

## 2017-09-29 MED ORDER — SODIUM CHLORIDE 0.9% FLUSH
5.0000 mL | Freq: Three times a day (TID) | INTRAVENOUS | Status: DC
  Administered 2017-09-29 – 2017-10-04 (×7): 5 mL

## 2017-09-29 MED ORDER — SODIUM CHLORIDE 0.9 % IV SOLN
INTRAVENOUS | Status: AC | PRN
  Administered 2017-09-29: 10 mL/h via INTRAVENOUS

## 2017-09-29 NOTE — Progress Notes (Signed)
Patient ID: Rhonda Hughes, female   DOB: Apr 12, 1940, 77 y.o.   MRN: [id number] EVENING ROUNDS NOTE :     [address]       [place] [address]             [phone]                 5 Days Post-Op Procedure(s) (LRB): CORONARY ARTERY BYPASS GRAFTING (CABG) x4 using mammary artery and saphenous vein. LIMA to LAD, SVG to RCA, SVG to RAMUS, SVT to OM. Endoscopic saphenous vein harvest. (N/A) TRANSESOPHAGEAL ECHOCARDIOGRAM (TEE) (N/A)  Total Length of Stay:  LOS: 5 days  BP 111/64 (BP Location: Left Arm)   Pulse 92   Temp 99 F (37.2 C) (Oral)   Resp 12   Ht 5\' 2"  (1.575 m)   Wt 55.8 kg   SpO2 92%   BMI 22.50 kg/m   .Intake/Output      09/11 0701 - 09/12 0700   P.O. 200   I.V. (mL/kg)    IV Piggyback 300   Total Intake(mL/kg) 500 (9)   Urine (mL/kg/hr)    Chest Tube    Total Output    Net +500         . cefTAZidime (FORTAZ)  IV 1 g (09/29/17 1838)     Lab Results  Component Value Date   WBC 11.0 (H) 09/29/2017   HGB 9.4 (L) 09/29/2017   HCT 29.3 (L) 09/29/2017   PLT 237 09/29/2017   GLUCOSE 101 (H) 09/29/2017   ALT 19 09/21/2017   AST 23 09/21/2017   NA 136 09/29/2017   K 2.8 (L) 09/29/2017   CL 96 (L) 09/29/2017   CREATININE 0.61 09/29/2017   BUN <5 (L) 09/29/2017   CO2 30 09/29/2017   TSH 0.467 04/24/2015   INR 1.03 09/29/2017   HGBA1C 5.8 (H) 09/17/2017   air leak from pigtail, stopped from chest tube ambulating well Sub q air decreased    Grace Isaac MD  Beeper [phone] Office [phone] 09/29/2017 8:28 PM

## 2017-09-29 NOTE — Progress Notes (Addendum)
[address]       [place] [address]             [phone]      5 Days Post-Op Procedure(s) (LRB): CORONARY ARTERY BYPASS GRAFTING (CABG) x4 using mammary artery and saphenous vein. LIMA to LAD, SVG to RCA, SVG to RAMUS, SVT to OM. Endoscopic saphenous vein harvest. (N/A) TRANSESOPHAGEAL ECHOCARDIOGRAM (TEE) (N/A) Subjective: Feeling ok, sub q air in face is improved  Objective: Vital signs in last 24 hours: Temp:  [97.4 F (36.3 C)-99 F (37.2 C)] 98 F (36.7 C) (09/11 0751) Pulse Rate:  [77-94] 93 (09/11 0700) Cardiac Rhythm: Normal sinus rhythm (09/11 0400) Resp:  [15-26] 26 (09/11 0700) BP: (87-147)/(49-102) 104/56 (09/11 0600) SpO2:  [94 %-100 %] 97 % (09/11 0700) Weight:  [55.8 kg] 55.8 kg (09/11 0500)  Hemodynamic parameters for last 24 hours:    Intake/Output from previous day: 09/10 0701 - 09/11 0700 In: 723 [P.O.:720; I.V.:3] Out: 885 [Urine:625; Chest Tube:260] Intake/Output this shift: No intake/output data recorded.  General appearance: alert, cooperative and no distress Heart: regular rate and rhythm Lungs: clear to auscultation bilaterally Abdomen: benign  Extremities: no edema Wound: incis healing well  Lab Results: Recent Labs    09/27/17 0248 09/29/17 0226  WBC 14.9* 11.0*  HGB 9.1* 9.4*  HCT 27.8* 29.3*  PLT 170 237   BMET:  Recent Labs    09/27/17 0248 09/29/17 0226  NA 133* 136  K 4.0 2.8*  CL 95* 96*  CO2 29 30  GLUCOSE 99 101*  BUN 7* <5*  CREATININE 0.67 0.61  CALCIUM 8.3* 8.3*    PT/INR: No results for input(s): LABPROT, INR in the last 72 hours. ABG    Component Value Date/Time   PHART 7.387 09/25/2017 0350   HCO3 27.4 09/25/2017 0350   TCO2 27 09/25/2017 1724   ACIDBASEDEF 1.0 06/21/2015 1259   O2SAT 98.1 09/25/2017 0350   CBG (last 3)  No results for input(s): GLUCAP in the last 72 hours.  Meds Scheduled Meds: . acetaminophen  1,000 mg Oral Q6H   Or  . acetaminophen (TYLENOL)  oral liquid 160 mg/5 mL  1,000 mg Per Tube Q6H  . aspirin EC  325 mg Oral Daily   Or  . aspirin  324 mg Per Tube Daily  . atorvastatin  20 mg Oral QHS  . bisacodyl  10 mg Oral Daily   Or  . bisacodyl  10 mg Rectal Daily  . chlorhexidine gluconate (MEDLINE KIT)  15 mL Mouth Rinse BID  . Chlorhexidine Gluconate Cloth  6 each Topical Daily  . Chlorhexidine Gluconate Cloth  6 each Topical Q0600  . docusate sodium  200 mg Oral Daily  . enoxaparin (LOVENOX) injection  40 mg Subcutaneous QHS  . feeding supplement (ENSURE ENLIVE)  237 mL Oral TID WC  . mouth rinse  15 mL Mouth Rinse BID  . memantine  10 mg Oral BID  . metoprolol tartrate  12.5 mg Oral BID   Or  . metoprolol tartrate  12.5 mg Per Tube BID  . pantoprazole  40 mg Oral Daily  . sodium chloride flush  10-40 mL Intracatheter Q12H  . sodium chloride flush  3 mL Intravenous Q12H  . venlafaxine XR  37.5 mg Oral QHS   Continuous Infusions: . cefTAZidime (FORTAZ)  IV 1 g (09/29/17 0430)   PRN Meds:.ALPRAZolam, metoprolol tartrate, morphine injection, ondansetron (ZOFRAN) IV, oxyCODONE, potassium chloride, sodium chloride flush,  sodium chloride flush, traMADol, zolpidem  Xrays Dg Chest Port 1 View  Result Date: 09/29/2017 CLINICAL DATA:  Pneumothoraces EXAM: PORTABLE CHEST 1 VIEW COMPARISON:  September 28, 2017 FINDINGS: Chest tube present on the left. There are apical pneumothoraces bilaterally, essentially stable from 1 day prior. There is extensive subcutaneous air, more on the left than on the right. There is bibasilar atelectasis. No consolidation. Heart size and pulmonary vascularity are normal. No adenopathy. Patient is status post coronary artery bypass grafting. There is aortic atherosclerosis. No appreciable bone lesions. IMPRESSION: Apical pneumothoraces on each side with extensive subcutaneous air. Chest tube present on the left, unchanged in position. Bibasilar atelectasis. No consolidation. Stable cardiac silhouette. There  is aortic atherosclerosis. Aortic Atherosclerosis (ICD10-I70.0). Electronically Signed   By: Lowella Grip III M.D.   On: 09/29/2017 07:52   Dg Chest Port 1 View  Result Date: 09/28/2017 CLINICAL DATA:  Chest 2. Status cardiac surgery. Shortness of breath. EXAM: PORTABLE CHEST 1 VIEW COMPARISON:  Multiple recent previous exams. FINDINGS: 0817 hours. Left chest tube remains in place with tiny residual left apical pneumothorax. Small right apical pneumothorax also noted on the left. Known left upper lobe pulmonary nodule again identified. The cardiopericardial silhouette is within normal limits for size. Basilar atelectasis evident with probable tiny bilateral pleural effusions. Subcutaneous emphysema noted in the supraclavicular regions bilaterally and bilateral lower neck. IMPRESSION: 1. Small apical pneumothorax bilaterally with left chest tube in place. 2. Basilar atelectasis with tiny bilateral pleural effusions. 3. Left upper lobe pulmonary nodule. These results will be called to the ordering clinician or representative by the Radiologist Assistant, and communication documented in the PACS or zVision Dashboard. Electronically Signed   By: Misty Stanley M.D.   On: 09/28/2017 09:49   Dg Chest Port 1 View  Result Date: 09/27/2017 CLINICAL DATA:  Left-sided face swelling post op coronary bypass grafting EXAM: PORTABLE CHEST 1 VIEW COMPARISON:  09/27/2017, 09/26/2017, 09/25/2017 FINDINGS: Postsurgical changes of the mediastinum. Left-sided chest tube with slightly curved appearance of the distal tubing at the left apex. Small left-sided pleural effusion with basilar atelectasis. Aortic atherosclerosis. Moderate emphysema within the left chest wall and left neck. IMPRESSION: 1. Small left pleural effusion and basilar atelectasis without significant change. 2. Continued moderate left chest wall and supraclavicular soft tissue emphysema. Electronically Signed   By: Donavan Foil M.D.   On: 09/27/2017 18:56    Dg Chest Port 1 View  Result Date: 09/27/2017 CLINICAL DATA:  Chest tube. EXAM: PORTABLE CHEST 1 VIEW COMPARISON:  Chest x-ray from same day at 4:57 a.m. FINDINGS: Interval removal of the mediastinal drain. Unchanged left-sided chest tube. Stable cardiomediastinal silhouette status post CABG. Unchanged small left pleural effusion and left lower lobe subsegmental atelectasis. No definite pneumothorax. Unchanged subcutaneous emphysema in the lower neck and left chest wall. No acute osseous abnormality. IMPRESSION: 1. Unchanged left-sided chest tube and subcutaneous emphysema in the lower neck and left chest wall. No definite pneumothorax. 2. Interval removal of the mediastinal drain. 3. Unchanged small left pleural effusion. Electronically Signed   By: Titus Dubin M.D.   On: 09/27/2017 12:05    Assessment/Plan: S/P Procedure(s) (LRB): CORONARY ARTERY BYPASS GRAFTING (CABG) x4 using mammary artery and saphenous vein. LIMA to LAD, SVG to RCA, SVG to RAMUS, SVT to OM. Endoscopic saphenous vein harvest. (N/A) TRANSESOPHAGEAL ECHOCARDIOGRAM (TEE) (N/A) 1 steady overall progress 2 still with air leak, cont O2 per Daniels,  CXR is stable in appearance- cont chest tube - pigtail to be  placed by IR 3 leukocytosis improved. H/H improved, thrombocytopenia improved, now on fortaz 4 renal fxn normal- does not appear volume overloaded at this time 5 hypokalemia- replace K+ 6 glucose control is good 7 routine pulm toilet/rehab  LOS: 5 days    Rhonda Hughes 09/29/2017 Pager [phone] patient examined and medical record reviewed,agree with above note. [name] 09/30/2017

## 2017-09-29 NOTE — Plan of Care (Signed)

## 2017-09-29 NOTE — Consult Note (Signed)
Chief Complaint: Patient was seen in consultation today for apical pneumothorax  Referring Physician(s): Dr. Prescott Gum  Supervising Physician: Arne Cleveland  Patient Status: [facility] - In-pt  History of Present Illness: Rhonda Hughes is a 77 y.o. female with history of HTN, HLD, anxiety, Barrett's esophagus, and COPD who presented to to her Cardiologist for cardiac cath 09/13/17. Results showed multivessel CAD with LM involvement. She underwent CABG x4 09/24/17.  A surgical chest tube was left in place post-op. Small air leak present.  Tube currently to water seal with significant drainage. She continues with small bilateral apical pneumothoraces.  IR consulted for left apical chest tube placement at the request of Dr. Vernard Gambles.   She has eaten breakfast this AM.  Her last INR was obtained 9/6 and was 1.39. She has been on lovenox since admission-- last dose 9/10 PM  Past Medical History:  Diagnosis Date  . AKI (acute kidney injury) ([place]) 05/16/2017  . Anxiety   . Barrett's esophagus without dysplasia   . CAD (coronary artery disease)    a. LHC 10/16/11: dLM 20%, mLAD 90%, pOM1 30%, mOM1 70% (FFR not hemodynamically significant), prox and mid RCA 30%, EF 65%;  b. PCI 10/16/11: Promus DES x 2 to mLAD  . COPD (chronic obstructive pulmonary disease) ([place])   . Depression   . Diverticulosis   . Essential hypertension   . Family history of adverse reaction to anesthesia    mother had "breathing problems"  . GERD (gastroesophageal reflux disease)   . Hyperlipidemia   . Rectocele     Past Surgical History:  Procedure Laterality Date  . ABDOMINAL HYSTERECTOMY    . CARDIAC CATHETERIZATION    . CARDIAC CATHETERIZATION N/A 06/21/2015   Procedure: Right/Left Heart Cath and Coronary Angiography;  Surgeon: [name], MD;  Location: [place] CV LAB;  Service: Cardiovascular;  Laterality: N/A;  . CARDIAC CATHETERIZATION  09/16/2017  . COLONOSCOPY  01/2008   Multiple diverticula  in desc and sigm colon  . CORONARY ARTERY BYPASS GRAFT N/A 09/24/2017   Procedure: CORONARY ARTERY BYPASS GRAFTING (CABG) x4 using mammary artery and saphenous vein. LIMA to LAD, SVG to RCA, SVG to RAMUS, SVT to OM. Endoscopic saphenous vein harvest.;  Surgeon: Ivin Poot, MD;  Location: [place];  Service: Open Heart Surgery;  Laterality: N/A;  . ESOPHAGOGASTRODUODENOSCOPY  01/2008   Barrett's esophagus, gastritis, no h.pylori, due follow-up 01/2011  . ESOPHAGOGASTRODUODENOSCOPY  04/06/2011   Procedure: ESOPHAGOGASTRODUODENOSCOPY (EGD);  Surgeon: Danie Binder, MD;  [facility];  Service: Endoscopy;  Laterality: N/A;  8:30  . FLEXIBLE SIGMOIDOSCOPY  08/11/2010 RECTAL PAIN/PRESSURE   SML IH, TICS  . FRACTIONAL FLOW RESERVE WIRE N/A 10/16/2011   Procedure: FRACTIONAL FLOW RESERVE WIRE;  Surgeon: [name], MD;  Location: [facility] CATH LAB;  Service: Cardiovascular;  Laterality: N/A;  . LEFT HEART CATH AND CORONARY ANGIOGRAPHY N/A 09/16/2017   Procedure: LEFT HEART CATH AND CORONARY ANGIOGRAPHY;  Surgeon: [name], MD;  Location: [place] CV LAB;  Service: Cardiovascular;  Laterality: N/A;  . LEFT HEART CATHETERIZATION WITH CORONARY ANGIOGRAM N/A 03/07/2012   Procedure: LEFT HEART CATHETERIZATION WITH CORONARY ANGIOGRAM;  Surgeon: [name], MD;  Location: [facility] CATH LAB;  Service: Cardiovascular;  Laterality: N/A;  . LEFT HEART CATHETERIZATION WITH CORONARY ANGIOGRAM N/A 12/14/2012   Procedure: LEFT HEART CATHETERIZATION WITH CORONARY ANGIOGRAM;  Surgeon: [name], MD;  Location: [facility] CATH LAB;  Service: Cardiovascular;  Laterality: N/A;  .  LEFT HEART CATHETERIZATION WITH CORONARY ANGIOGRAM N/A 05/07/2014   Procedure: LEFT HEART CATHETERIZATION WITH CORONARY ANGIOGRAM;  Surgeon: [name], MD;  Location: [facility] CATH LAB;  Service: Cardiovascular;  Laterality: N/A;  . PARTIAL HYSTERECTOMY  1980s  . TEE WITHOUT CARDIOVERSION N/A 09/24/2017     Procedure: TRANSESOPHAGEAL ECHOCARDIOGRAM (TEE);  Surgeon: Prescott Gum, Collier Salina, MD;  Location: [place];  Service: Open Heart Surgery;  Laterality: N/A;    Allergies: Adhesive [tape] and Sulfa antibiotics  Medications: Prior to Admission medications   Medication Sig Start Date End Date Taking? Authorizing Provider  ALPRAZolam (XANAX) 0.25 MG tablet Take 0.25 mg by mouth 2 (two) times daily as needed for anxiety.     [provider]  aspirin EC 81 MG EC tablet Take 1 tablet (81 mg total) by mouth daily. 09/18/17   Barrett, Evelene Croon, PA-C  atorvastatin (LIPITOR) 20 MG tablet Take 1 tablet (20 mg total) by mouth at bedtime. 09/17/17   Barrett, Evelene Croon, PA-C  clopidogrel (PLAVIX) 75 MG tablet Take 75 mg by mouth daily.    [provider]  diphenhydramine-acetaminophen (TYLENOL PM) 25-500 MG TABS Take 2 tablets by mouth at bedtime as needed (for sleep).     [provider]  docusate sodium (COLACE) 100 MG capsule Take 200 mg by mouth daily as needed for mild constipation.     [provider]  isosorbide mononitrate (IMDUR) 30 MG 24 hr tablet TAKE 1 TABLET BY MOUTH  DAILY Patient taking differently: Take 30 mg by mouth at bedtime.  01/04/17   [name], MD  lisinopril-hydrochlorothiazide (PRINZIDE,ZESTORETIC) 20-12.5 MG tablet Take 1 tablet by mouth daily. Hold medication until follow-up with PCP Patient taking differently: Take 1 tablet by mouth at bedtime.  05/19/17   Barton Dubois, MD  magnesium oxide (MAG-OX) 400 (241.3 Mg) MG tablet Take 0.5 tablets (200 mg total) by mouth daily. 09/18/17   Barrett, Evelene Croon, PA-C  memantine (NAMENDA) 10 MG tablet Take 10 mg by mouth 2 (two) times daily.  05/04/17   [provider]  nitroGLYCERIN (NITROSTAT) 0.4 MG SL tablet Place 1 tablet (0.4 mg total) under the tongue every 5 (five) minutes as needed for chest pain. 09/17/17   Barrett, Evelene Croon, PA-C  pantoprazole (PROTONIX) 20 MG tablet Take 20 mg by mouth  daily.    [provider]  Polyvinyl Alcohol-Povidone PF (REFRESH) 1.4-0.6 % SOLN Place 1-2 drops into both eyes 2 (two) times daily as needed (for eye irritation).     [provider]  potassium chloride SA (K-DUR,KLOR-CON) 20 MEQ tablet Take 1 tablet (20 mEq total) by mouth daily. 05/19/17   Barton Dubois, MD  venlafaxine XR (EFFEXOR-XR) 37.5 MG 24 hr capsule Take 37.5 mg by mouth at bedtime.    [provider]  zolpidem (AMBIEN) 10 MG tablet Take 10 mg by mouth at bedtime.    [provider]  niacin (NIASPAN) 500 MG CR tablet Take 500 mg by mouth at bedtime.    04/01/11  [provider]     Family History  Problem Relation Age of Onset  . Coronary artery disease Father 67  . Heart attack Father 5  . GI problems Mother        Perforated colon   . Coronary artery disease Paternal Aunt   . Coronary artery disease Paternal Uncle   . Colon cancer Neg Hx   . Gastric cancer Neg Hx   . Esophageal cancer Neg Hx  Social History   Socioeconomic History  . Marital status: Married    Spouse name: Not on file  . Number of children: 1  . Years of education: Not on file  . Highest education level: Not on file  Occupational History  . Occupation: Retired Child psychotherapist    Comment: retired    Fish farm manager: RETIRED  Social Needs  . Financial resource strain: Not on file  . Food insecurity:    Worry: Not on file    Inability: Not on file  . Transportation needs:    Medical: Not on file    Non-medical: Not on file  Tobacco Use  . Smoking status: Former Smoker    Packs/day: 1.00    Years: 53.00    Pack years: 53.00    Types: Cigarettes    Last attempt to quit: 03/20/2007    Years since quitting: 10.5  . Smokeless tobacco: Never Used  Substance and Sexual Activity  . Alcohol use: No  . Drug use: No  . Sexual activity: Yes  Lifestyle  . Physical activity:    Days per week: Not on file    Minutes per session: Not on file  . Stress:  Not on file  Relationships  . Social connections:    Talks on phone: Not on file    Gets together: Not on file    Attends religious service: Not on file    Active member of club or organization: Not on file    Attends meetings of clubs or organizations: Not on file    Relationship status: Not on file  Other Topics Concern  . Not on file  Social History Narrative  . Not on file     Review of Systems: A 12 point ROS discussed and pertinent positives are indicated in the HPI above.  All other systems are negative.  Review of Systems  Constitutional: Negative for fatigue and fever.  Respiratory: Positive for shortness of breath (PTA). Negative for cough.   Cardiovascular: Positive for chest pain (PTA).  Gastrointestinal: Negative for abdominal pain, diarrhea, nausea and vomiting.  Genitourinary: Negative for dysuria and flank pain.  Musculoskeletal: Negative for back pain.  Psychiatric/Behavioral: Negative for behavioral problems and confusion.    Vital Signs: BP (!) 110/56   Pulse 75   Temp 98 F (36.7 C) (Oral)   Resp 19   Ht 5\' 2"  (1.575 m)   Wt 123 lb 0.3 oz (55.8 kg)   SpO2 97%   BMI 22.50 kg/m   Physical Exam  Constitutional: She is oriented to person, place, and time. She appears well-developed. No distress.  Cardiovascular: Normal rate, regular rhythm and normal heart sounds. Exam reveals no gallop and no friction rub.  No murmur heard. Pulmonary/Chest: Effort normal. No respiratory distress.  Diminished breath sounds throughout, but overall clear Left surgical chest tube in place  Neurological: She is alert and oriented to person, place, and time.  Skin: Skin is warm and dry. She is not diaphoretic.  Psychiatric: She has a normal mood and affect. Her behavior is normal. Judgment and thought content normal.  Nursing note and vitals reviewed.    MD Evaluation Airway: WNL Heart: WNL Abdomen: WNL Chest/ Lungs: WNL ASA  Classification: 3 Mallampati/Airway  Score: Two   Imaging: Dg Chest 2 View  Result Date: 09/17/2017 CLINICAL DATA:  Chest pain.  Shortness of breath. EXAM: CHEST - 2 VIEW COMPARISON:  04/24/2015. FINDINGS: Mediastinum hilar structures normal. Lungs are clear. No pleural effusion or pneumothorax. Prominent  2 cm nodular density noted over the left upper lung. Malignancy including primary lung cancer could present this fashion. CT scratched it contrast-enhanced chest CT suggested for further evaluation. No pleural effusion. No pneumothorax. IMPRESSION: 2 cm nodular opacity noted over the left upper lung. This is suspicious for tumor such as a primary lung cancer. IV contrast-enhanced chest CT is suggested for further evaluation. These results will be called to the ordering clinician or representative by the Radiologist Assistant, and communication documented in the PACS or zVision Dashboard. Electronically Signed   By: Marcello Moores  Register   On: 09/17/2017 12:46   Ct Chest W Contrast  Result Date: 09/17/2017 CLINICAL DATA:  77 year old female with 2 centimeter apparent lung nodule on chest radiographs today performed for chest pain and shortness of breath. EXAM: CT CHEST WITH CONTRAST TECHNIQUE: Multidetector CT imaging of the chest was performed during intravenous contrast administration. CONTRAST:  12mL ISOVUE-300 IOPAMIDOL (ISOVUE-300) INJECTION 61% COMPARISON:  Chest radiographs 1111 hours today. CTA chest 10/21/2011. FINDINGS: Cardiovascular: Extensive calcified coronary artery atherosclerosis and/or stent(s). Calcified aortic atherosclerosis. The central pulmonary arteries appear patent. No cardiomegaly or pericardial effusion. Mediastinum/Nodes: Negative.  No lymphadenopathy. Lungs/Pleura: There is an oval, generally smooth but mildly lobulated left upper lobe pulmonary nodule encompassing 18 x 40 x 20 millimeters (AP by transverse by CC) as seen on series 5, image 56 and coronal image 60. This appears to be enhancing, and has partially  engulfed a left upper lobe pulmonary artery branch as seen on coronal images 59 through 61 (arrows). This is new since 2013, and there is underlying chronic centrilobular emphysema. No other pulmonary nodule. The major airways are patent. No pleural effusion. Upper Abdomen: Small indeterminate oval 11 mm low-density area in the central liver which might also be new since 2013 has low to intermediate density. No other lesion in the visible liver. Negative visible gallbladder, spleen, pancreas, adrenal glands, kidneys, and proximal bowel in the upper abdomen. Diverticulosis at the splenic flexure of colon. Musculoskeletal: No acute or suspicious osseous lesion identified. IMPRESSION: 1. Chronic Emphysema (ICD10-J43.9) with confirmed 2 cm left upper lobe nodule, new since 2013 and highly suspicious for bronchogenic carcinoma despite relatively smooth contours. Note that this lesion has partially engulfed a pulmonary artery branch as demonstrated on coronal images 59-61. 2. No associated mediastinal lymphadenopathy. There is a small 1.1 cm indeterminate low-density lesion in the central liver, also seemingly new since 2013. 3. Calcified coronary artery and Aortic Atherosclerosis (ICD10-I70.0). Electronically Signed   By: Genevie Ann M.D.   On: 09/17/2017 17:24   Dg Chest Port 1 View  Result Date: 09/29/2017 CLINICAL DATA:  Pneumothoraces EXAM: PORTABLE CHEST 1 VIEW COMPARISON:  September 28, 2017 FINDINGS: Chest tube present on the left. There are apical pneumothoraces bilaterally, essentially stable from 1 day prior. There is extensive subcutaneous air, more on the left than on the right. There is bibasilar atelectasis. No consolidation. Heart size and pulmonary vascularity are normal. No adenopathy. Patient is status post coronary artery bypass grafting. There is aortic atherosclerosis. No appreciable bone lesions. IMPRESSION: Apical pneumothoraces on each side with extensive subcutaneous air. Chest tube present on  the left, unchanged in position. Bibasilar atelectasis. No consolidation. Stable cardiac silhouette. There is aortic atherosclerosis. Aortic Atherosclerosis (ICD10-I70.0). Electronically Signed   By: Lowella Grip III M.D.   On: 09/29/2017 07:52   Dg Chest Port 1 View  Result Date: 09/28/2017 CLINICAL DATA:  Chest 2. Status cardiac surgery. Shortness of breath. EXAM: PORTABLE CHEST 1 VIEW  COMPARISON:  Multiple recent previous exams. FINDINGS: 0817 hours. Left chest tube remains in place with tiny residual left apical pneumothorax. Small right apical pneumothorax also noted on the left. Known left upper lobe pulmonary nodule again identified. The cardiopericardial silhouette is within normal limits for size. Basilar atelectasis evident with probable tiny bilateral pleural effusions. Subcutaneous emphysema noted in the supraclavicular regions bilaterally and bilateral lower neck. IMPRESSION: 1. Small apical pneumothorax bilaterally with left chest tube in place. 2. Basilar atelectasis with tiny bilateral pleural effusions. 3. Left upper lobe pulmonary nodule. These results will be called to the ordering clinician or representative by the Radiologist Assistant, and communication documented in the PACS or zVision Dashboard. Electronically Signed   By: Misty Stanley M.D.   On: 09/28/2017 09:49   Dg Chest Port 1 View  Result Date: 09/27/2017 CLINICAL DATA:  Left-sided face swelling post op coronary bypass grafting EXAM: PORTABLE CHEST 1 VIEW COMPARISON:  09/27/2017, 09/26/2017, 09/25/2017 FINDINGS: Postsurgical changes of the mediastinum. Left-sided chest tube with slightly curved appearance of the distal tubing at the left apex. Small left-sided pleural effusion with basilar atelectasis. Aortic atherosclerosis. Moderate emphysema within the left chest wall and left neck. IMPRESSION: 1. Small left pleural effusion and basilar atelectasis without significant change. 2. Continued moderate left chest wall and  supraclavicular soft tissue emphysema. Electronically Signed   By: Donavan Foil M.D.   On: 09/27/2017 18:56   Dg Chest Port 1 View  Result Date: 09/27/2017 CLINICAL DATA:  Chest tube. EXAM: PORTABLE CHEST 1 VIEW COMPARISON:  Chest x-ray from same day at 4:57 a.m. FINDINGS: Interval removal of the mediastinal drain. Unchanged left-sided chest tube. Stable cardiomediastinal silhouette status post CABG. Unchanged small left pleural effusion and left lower lobe subsegmental atelectasis. No definite pneumothorax. Unchanged subcutaneous emphysema in the lower neck and left chest wall. No acute osseous abnormality. IMPRESSION: 1. Unchanged left-sided chest tube and subcutaneous emphysema in the lower neck and left chest wall. No definite pneumothorax. 2. Interval removal of the mediastinal drain. 3. Unchanged small left pleural effusion. Electronically Signed   By: Titus Dubin M.D.   On: 09/27/2017 12:05   Dg Chest Port 1 View  Result Date: 09/27/2017 CLINICAL DATA:  77 year old female post CABG.  Subsequent encounter. EXAM: PORTABLE CHEST 1 VIEW COMPARISON:  09/26/2017. FINDINGS: Post CABG. Heart slightly enlarged. Small left-sided pleural effusion and left base subsegmental atelectasis. Left-sided chest tube and mediastinal drain remain in place. Interval development of subcutaneous emphysema lower neck, left supraclavicular region and left lateral chest wall. This may reflect changes of pneumomediastinum dissecting superiorly. Difficult to completely exclude tiny left apical pneumothorax although I suspect this represents overlying subcutaneous gas. Calcified aorta. Removal right introducer catheter. Epicardial leads remain in place. No acute osseous abnormality. IMPRESSION: 1. Post CABG. Heart slightly enlarged. Small left-sided pleural effusion and left base subsegmental atelectasis. 2. Left-sided chest tube and mediastinal drain remain in place. Interval development of subcutaneous emphysema lower neck,  left supraclavicular region and left lateral chest wall. This may reflect changes of pneumomediastinum dissecting superiorly. Difficult to completely exclude tiny left apical pneumothorax although I suspect this represents overlying subcutaneous gas. 3.  Aortic Atherosclerosis (ICD10-I70.0). These results will be called to the ordering clinician or representative by the Radiologist Assistant, and communication documented in the PACS or zVision Dashboard. Electronically Signed   By: Genia Del M.D.   On: 09/27/2017 08:18   Dg Chest Port 1 View  Result Date: 09/26/2017 CLINICAL DATA:  History of CABG EXAM:  PORTABLE CHEST 1 VIEW COMPARISON:  September 25, 2016 FINDINGS: The PA catheter is been removed. A right IJ sheath remains. The left chest tube remains as well. No pneumothorax. Stable cardiomediastinal silhouette. Persistent small effusion and atelectasis on the left. Mild interstitial opacity on the left may represent mild asymmetric pulmonary venous congestion, unchanged. No other interval change. IMPRESSION: 1. Support apparatus as above. 2. Possible mild pulmonary venous congestion, asymmetric to the left. 3. Tiny left effusion with underlying atelectasis. Electronically Signed   By: Dorise Bullion III M.D   On: 09/26/2017 07:55   Dg Chest Port 1 View  Result Date: 09/25/2017 CLINICAL DATA:  CABG. EXAM: PORTABLE CHEST 1 VIEW COMPARISON:  Chest x-ray from yesterday. FINDINGS: Interval removal of the endotracheal and enteric tubes. Unchanged Swan-Ganz catheter and mediastinal and left chest tubes. Stable cardiomediastinal silhouette status post CABG. Unchanged small left pleural effusion and left lower lobe atelectasis. Improving edema in the left lung. Slightly improved aeration at the right lung base. The lungs remain hyperinflated with emphysematous changes. No pneumothorax. No acute osseous abnormality. IMPRESSION: 1. Improving asymmetric edema involving the left lung. Slightly improved aeration at  the right lung base. 2. Unchanged small left pleural effusion and adjacent atelectasis. 3. COPD. Electronically Signed   By: Titus Dubin M.D.   On: 09/25/2017 07:49   Dg Chest Port 1 View  Result Date: 09/24/2017 CLINICAL DATA:  Prior CABG. EXAM: PORTABLE CHEST 1 VIEW COMPARISON:  CT 09/17/2017. FINDINGS: Endotracheal tube noted with tip 3.8 cm above the carina. NG tube noted with its tip below left hemidiaphragm. Swan-Ganz catheter noted with tip in the pulmonary outflow tract. Mediastinal drainage catheter noted with its tip over the upper mid mediastinum. Left chest tube noted with its tip kinked in the left apex. No pneumothorax. Prior CABG. Mild cardiomegaly. Bilateral pulmonary infiltrates/edema and small left pleural effusion. Findings suggest heart failure. Bibasilar atelectasis. IMPRESSION: 1. Lines and tubes as above. It should be noted that the left chest tube tip is kinked in the apex. No pneumothorax. 2. Prior CABG. Findings suggesting congestive heart failure bilateral pulmonary interstitial edema and left-sided pleural effusion bibasilar atelectasis. Electronically Signed   By: Marcello Moores  Register   On: 09/24/2017 14:56    Labs:  CBC: Recent Labs    09/25/17 1700 09/25/17 1724 09/26/17 0423 09/27/17 0248 09/29/17 0226  WBC 17.1*  --  15.4* 14.9* 11.0*  HGB 10.4* 10.2* 9.3* 9.1* 9.4*  HCT 31.2* 30.0* 28.7* 27.8* 29.3*  PLT 169  --  147* 170 237    COAGS: Recent Labs    09/21/17 0902 09/24/17 1352  INR 0.96 1.39  APTT 36 31    BMP: Recent Labs    09/25/17 0404 09/25/17 1700 09/25/17 1724 09/26/17 0423 09/27/17 0248 09/29/17 0226  NA 135  --  133* 134* 133* 136  K 3.3*  --  3.4* 4.0 4.0 2.8*  CL 102  --  91* 96* 95* 96*  CO2 26  --   --  31 29 30   GLUCOSE 109*  --  122* 98 99 101*  BUN <5*  --  <3* <5* 7* <5*  CALCIUM 7.9*  --   --  8.3* 8.3* 8.3*  CREATININE 0.72 0.83 0.80 0.69 0.67 0.61  GFRNONAA >60 >60  --  >60 >60 >60  GFRAA >60 >60  --  >60 >60 >60      LIVER FUNCTION TESTS: Recent Labs    05/19/17 0619 09/17/17 0409 09/21/17 0902  BILITOT  --  1.0 0.8  AST  --  20 23  ALT  --  16 19  ALKPHOS  --  70 77  PROT  --  6.4* 7.5  ALBUMIN 3.0* 3.5 4.1    TUMOR MARKERS: No results for input(s): AFPTM, CEA, CA199, CHROMGRNA in the last 8760 hours.  Assessment and Plan: Apical PTX s/p CABG x4 09/24/17 Patient with surgical chest drain in place.  Small air leak. Persistent apical PTX.  IR consulted for pigtail chest tube placement.  Case reviewed by Dr. Vernard Gambles who approves patient for procedure.  She has eaten breakfast this AM.  Will make NPO for possible placement this afternoon in CT.  INR ordered (most recent value 1.39 on 9/6). Lovenox held.   Risks and benefits discussed with the patient including, but not limited to bleeding, hemoptysis, respiratory failure requiring intubation, infection.  All of the patient's questions were answered, patient is agreeable to proceed. Consent signed and in chart.  Thank you for this interesting consult.  I greatly enjoyed meeting Rhonda Hughes and look forward to participating in their care.  A copy of this report was sent to the requesting provider on this date.  Electronically Signed: Docia Barrier, PA 09/29/2017, 10:03 AM   I spent a total of 40 Minutes    in face to face in clinical consultation, greater than 50% of which was counseling/coordinating care for pneumothorax.

## 2017-09-29 NOTE — Procedures (Signed)
  Procedure: LEFT chest tube placement under CT 73f to apex EBL:   minimal Complications:  none immediate  See full dictation in [facility].  Dillard Cannon MD Main # [phone] Pager  [phone]

## 2017-09-30 ENCOUNTER — Inpatient Hospital Stay (HOSPITAL_COMMUNITY): Payer: Medicare Other

## 2017-09-30 LAB — BASIC METABOLIC PANEL
Anion gap: 8 (ref 5–15)
BUN: 5 mg/dL — AB (ref 8–23)
CALCIUM: 8.4 mg/dL — AB (ref 8.9–10.3)
CHLORIDE: 100 mmol/L (ref 98–111)
CO2: 29 mmol/L (ref 22–32)
CREATININE: 0.71 mg/dL (ref 0.44–1.00)
GFR calc non Af Amer: >60 mL/min (ref >60)
Glucose, Bld: 104 mg/dL — ABNORMAL HIGH (ref 70–99)
Potassium: 3.5 mmol/L (ref 3.5–5.1)
Sodium: 137 mmol/L (ref 135–145)

## 2017-09-30 LAB — GLUCOSE, CAPILLARY
Glucose-Capillary: 116 mg/dL — ABNORMAL HIGH (ref 70–99)
Glucose-Capillary: 129 mg/dL — ABNORMAL HIGH (ref 70–99)

## 2017-09-30 MED ORDER — POTASSIUM CHLORIDE CRYS ER 20 MEQ PO TBCR
40.0000 meq | EXTENDED_RELEASE_TABLET | Freq: Two times a day (BID) | ORAL | Status: AC
  Administered 2017-09-30 (×2): 40 meq via ORAL
  Filled 2017-09-30 (×2): qty 2

## 2017-09-30 MED ORDER — INSULIN ASPART 100 UNIT/ML ~~LOC~~ SOLN
0.0000 [IU] | Freq: Three times a day (TID) | SUBCUTANEOUS | Status: DC
  Administered 2017-09-30 – 2017-10-01 (×3): 2 [IU] via SUBCUTANEOUS

## 2017-09-30 MED ORDER — SODIUM CHLORIDE 0.9% FLUSH
3.0000 mL | INTRAVENOUS | Status: DC | PRN
  Administered 2017-09-30 – 2017-10-02 (×3): 3 mL via INTRAVENOUS
  Filled 2017-09-30 (×3): qty 3

## 2017-09-30 MED ORDER — SODIUM CHLORIDE 0.9 % IV SOLN
250.0000 mL | INTRAVENOUS | Status: DC | PRN
  Administered 2017-09-30 – 2017-10-01 (×3): 250 mL via INTRAVENOUS
  Administered 2017-10-01: 200 mL via INTRAVENOUS

## 2017-09-30 MED ORDER — MOVING RIGHT ALONG BOOK
Freq: Once | Status: AC
  Administered 2017-09-30: 08:00:00
  Filled 2017-09-30: qty 1

## 2017-09-30 MED ORDER — SODIUM CHLORIDE 0.9% FLUSH
3.0000 mL | Freq: Two times a day (BID) | INTRAVENOUS | Status: DC
  Administered 2017-09-30 – 2017-10-08 (×11): 3 mL via INTRAVENOUS

## 2017-09-30 MED FILL — Sodium Chloride IV Soln 0.9%: INTRAVENOUS | Qty: 2000 | Status: AC

## 2017-09-30 MED FILL — Electrolyte-R (PH 7.4) Solution: INTRAVENOUS | Qty: 5000 | Status: AC

## 2017-09-30 MED FILL — Mannitol IV Soln 20%: INTRAVENOUS | Qty: 500 | Status: AC

## 2017-09-30 MED FILL — Heparin Sodium (Porcine) Inj 1000 Unit/ML: INTRAMUSCULAR | Qty: 10 | Status: AC

## 2017-09-30 MED FILL — Lidocaine HCl(Cardiac) IV PF Soln Pref Syr 100 MG/5ML (2%): INTRAVENOUS | Qty: 5 | Status: AC

## 2017-09-30 MED FILL — Sodium Bicarbonate IV Soln 8.4%: INTRAVENOUS | Qty: 50 | Status: AC

## 2017-09-30 NOTE — Progress Notes (Signed)
Chest tube removed without issue. Pigtail remains to -10 sxn. Report called to 4E for transfer

## 2017-09-30 NOTE — Progress Notes (Signed)
CARDIAC REHAB PHASE I   PRE:  Rate/Rhythm: 96 SR    BP: sitting 135/56    SaO2: 92 RA  MODE:  Ambulation: 790 ft   POST:  Rate/Rhythm: 115 ST with PVC    BP: sitting 127/85     SaO2: 92 RA  Pt in bed, needing to go to BR. Sts she is having burning in her bladder area. Caught urine for sample. Pt walked with RW independently in hall. Long distance, only somewhat SOB. Feels well, very motivated. To recliner, CT to suction. Practicing IS, 1200 mL. Encouraged another walk.  [place], ACSM 09/30/2017 2:20 PM

## 2017-09-30 NOTE — Progress Notes (Signed)
Pigtail decreased to -10 suction per MD Nils Pyle. Will remove chest tube per order prior to transfer to stepdown.  No s/sx increased sq air, sob, dyspnea, sp02 >95.

## 2017-09-30 NOTE — Progress Notes (Addendum)
[address]       [place] [address]             [phone]      6 Days Post-Op Procedure(s) (LRB): CORONARY ARTERY BYPASS GRAFTING (CABG) x4 using mammary artery and saphenous vein. LIMA to LAD, SVG to RCA, SVG to RAMUS, SVT to OM. Endoscopic saphenous vein harvest. (N/A) TRANSESOPHAGEAL ECHOCARDIOGRAM (TEE) (N/A) Subjective: Feels better, small air leak , facial sub q air improved   Objective: Vital signs in last 24 hours: Temp:  [98.2 F (36.8 C)-99.2 F (37.3 C)] 98.8 F (37.1 C) (09/12 1033) Pulse Rate:  [79-100] 80 (09/12 1033) Cardiac Rhythm: Normal sinus rhythm;Other (Comment) (09/12 1033) Resp:  [12-29] 18 (09/12 1033) BP: (77-148)/(41-77) 148/72 (09/12 1033) SpO2:  [87 %-100 %] 95 % (09/12 0930) Weight:  [56 kg] 56 kg (09/12 0500)  Hemodynamic parameters for last 24 hours:    Intake/Output from previous day: 09/11 0701 - 09/12 0700 In: 1082.1 [P.O.:440; IV Piggyback:642.1] Out: 5 [Chest Tube:5] Intake/Output this shift: Total I/O In: 360 [P.O.:360] Out: 0   General appearance: alert, cooperative and no distress Heart: regular rate and rhythm Lungs: clear to auscultation bilaterally Abdomen: soft, non-tender; bowel sounds normal; no masses,  no organomegaly Extremities: no edema Wound: incis healing well  Lab Results: Recent Labs    09/29/17 0226  WBC 11.0*  HGB 9.4*  HCT 29.3*  PLT 237   BMET:  Recent Labs    09/29/17 0226 09/30/17 0436  NA 136 137  K 2.8* 3.5  CL 96* 100  CO2 30 29  GLUCOSE 101* 104*  BUN <5* 5*  CREATININE 0.61 0.71  CALCIUM 8.3* 8.4*    PT/INR:  Recent Labs    09/29/17 1132  LABPROT 13.4  INR 1.03   ABG    Component Value Date/Time   PHART 7.387 09/25/2017 0350   HCO3 27.4 09/25/2017 0350   TCO2 27 09/25/2017 1724   ACIDBASEDEF 1.0 06/21/2015 1259   O2SAT 98.1 09/25/2017 0350   CBG (last 3)  No results for input(s): GLUCAP in the last 72 hours.  Meds Scheduled Meds: .  aspirin EC  325 mg Oral Daily   Or  . aspirin  324 mg Per Tube Daily  . atorvastatin  20 mg Oral QHS  . bisacodyl  10 mg Oral Daily   Or  . bisacodyl  10 mg Rectal Daily  . chlorhexidine gluconate (MEDLINE KIT)  15 mL Mouth Rinse BID  . Chlorhexidine Gluconate Cloth  6 each Topical Daily  . Chlorhexidine Gluconate Cloth  6 each Topical Q0600  . docusate sodium  200 mg Oral Daily  . enoxaparin (LOVENOX) injection  40 mg Subcutaneous QHS  . feeding supplement (ENSURE ENLIVE)  237 mL Oral TID WC  . insulin aspart  0-24 Units Subcutaneous TID AC & HS  . mouth rinse  15 mL Mouth Rinse BID  . memantine  10 mg Oral BID  . metoprolol tartrate  12.5 mg Oral BID   Or  . metoprolol tartrate  12.5 mg Per Tube BID  . pantoprazole  40 mg Oral Daily  . sodium chloride flush  10-40 mL Intracatheter Q12H  . sodium chloride flush  3 mL Intravenous Q12H  . sodium chloride flush  3 mL Intravenous Q12H  . sodium chloride flush  5 mL Intracatheter Q8H  . venlafaxine XR  37.5 mg Oral QHS   Continuous Infusions: . sodium chloride    .  cefTAZidime (FORTAZ)  IV 1 g (09/30/17 0530)   PRN Meds:.sodium chloride, ALPRAZolam, metoprolol tartrate, morphine injection, ondansetron (ZOFRAN) IV, oxyCODONE, potassium chloride, sodium chloride flush, sodium chloride flush, sodium chloride flush, traMADol, zolpidem  Xrays Dg Chest Port 1 View  Result Date: 09/30/2017 CLINICAL DATA:  Chest tube placement EXAM: PORTABLE CHEST 1 VIEW COMPARISON:  09/29/2017 FINDINGS: Left large bore chest tube remains in place, unchanged. Interval placement of pigtail left pleural drainage catheter with slight decreased size of the left pneumothorax. Approximately 5-10% left pneumothorax remains. Small right apical pneumothorax again noted, slightly decreased in size. Subcutaneous emphysema noted in the left chest wall and both sides at the neck base. Hyperinflation/COPD. Bibasilar atelectasis and small effusions. Prior CABG. Borderline  heart size. IMPRESSION: Bilateral pneumothoraces again noted, slightly decreased in size. Interval placement of 2nd left chest tube. COPD.  Bibasilar atelectasis and small effusions. Electronically Signed   By: Rolm Baptise M.D.   On: 09/30/2017 07:59   Dg Chest Port 1 View  Result Date: 09/29/2017 CLINICAL DATA:  Pneumothoraces EXAM: PORTABLE CHEST 1 VIEW COMPARISON:  September 28, 2017 FINDINGS: Chest tube present on the left. There are apical pneumothoraces bilaterally, essentially stable from 1 day prior. There is extensive subcutaneous air, more on the left than on the right. There is bibasilar atelectasis. No consolidation. Heart size and pulmonary vascularity are normal. No adenopathy. Patient is status post coronary artery bypass grafting. There is aortic atherosclerosis. No appreciable bone lesions. IMPRESSION: Apical pneumothoraces on each side with extensive subcutaneous air. Chest tube present on the left, unchanged in position. Bibasilar atelectasis. No consolidation. Stable cardiac silhouette. There is aortic atherosclerosis. Aortic Atherosclerosis (ICD10-I70.0). Electronically Signed   By: Lowella Grip III M.D.   On: 09/29/2017 07:52   Ct Image Guided Drainage By Percutaneous Catheter  Result Date: 09/30/2017 CLINICAL DATA:  Recent CABG with persistent left pneumothorax despite chest tube. CT-guided pleural drain catheter requested. EXAM: CT GUIDED LEFT CHEST TUBE PLACEMENT ANESTHESIA/SEDATION: Intravenous Fentanyl and Versed were administered as conscious sedation during continuous monitoring of the patient's level of consciousness and physiological / cardiorespiratory status by the radiology RN, with a total moderate sedation time of 16 minutes. PROCEDURE: The procedure, risks, benefits, and alternatives were explained to the patient. Questions regarding the procedure were encouraged and answered. The patient understands and consents to the procedure. Select axial scans through the  thorax were obtained. An appropriate skin entry site was determined and marked. The operative field was prepped with chlorhexidinein a sterile fashion, and a sterile drape was applied covering the operative field. A sterile gown and sterile gloves were used for the procedure. Local anesthesia was provided with 1% Lidocaine. Under CT fluoroscopic guidance, a 19 gauge percutaneous entry needle was advanced into the anterior/apical pneumothorax. Gas could be aspirated. An Amplatz guidewire advanced easily. Tract dilated to facilitate placement of a 10 French pigtail catheter, directed to the lung apex. CT confirmed appropriate positioning. The catheter was secured externally with 0 Prolene suture and StatLock and placed to Pleur-evac drainage. The patient tolerated the procedure well. COMPLICATIONS: None immediate FINDINGS: Loculated anterior and apical left pneumothorax was identified. Extensive subcutaneous emphysema. Pigtail drain catheter placed to the lung apex from an anterior approach as above. IMPRESSION: 1. Technically successful CT-guided left chest tube placement. Electronically Signed   By: Lucrezia Europe M.D.   On: 09/30/2017 10:32    Assessment/Plan: S/P Procedure(s) (LRB): CORONARY ARTERY BYPASS GRAFTING (CABG) x4 using mammary artery and saphenous vein. LIMA to LAD, SVG  to RCA, SVG to RAMUS, SVT to OM. Endoscopic saphenous vein harvest. (N/A) TRANSESOPHAGEAL ECHOCARDIOGRAM (TEE) (N/A)   1 stable pntx, slightly improved on left with new tube- cont to suction with small air leak 2 hemodyn stable in sinus with some pvc's, no significant volume overload 3 cont to replace K+ 4 routine pulm toilet and rehab, cont fortaz, repeat labs in am  LOS: 6 days    [name] 09/30/2017 Pager [phone]  Reduced chest tube suction to 10 cm of water with plans to transition to waterseal tomorrow Left pleural tube placed at time of surgery has been removed and replaced with a IR pigtail. Patient has severe  emphysema-COPD but oxygenation has been satisfactory Agree with above note and continue current  Care patient examined and medical record reviewed,agree with above note. [name] 09/30/2017

## 2017-10-01 ENCOUNTER — Encounter (HOSPITAL_COMMUNITY): Payer: Self-pay | Admitting: Interventional Radiology

## 2017-10-01 ENCOUNTER — Inpatient Hospital Stay (HOSPITAL_COMMUNITY): Payer: Medicare Other

## 2017-10-01 HISTORY — PX: IR PERC PLEURAL DRAIN W/INDWELL CATH W/IMG GUIDE: IMG5383

## 2017-10-01 LAB — CBC
HCT: 30.2 % — ABNORMAL LOW (ref 36.0–46.0)
Hemoglobin: 9.9 g/dL — ABNORMAL LOW (ref 12.0–15.0)
MCH: 31.6 pg (ref 26.0–34.0)
MCHC: 32.8 g/dL (ref 30.0–36.0)
MCV: 96.5 fL (ref 78.0–100.0)
Platelets: 393 10*3/uL (ref 150–400)
RBC: 3.13 MIL/uL — ABNORMAL LOW (ref 3.87–5.11)
RDW: 13.2 % (ref 11.5–15.5)
WBC: 13.7 10*3/uL — ABNORMAL HIGH (ref 4.0–10.5)

## 2017-10-01 LAB — BASIC METABOLIC PANEL
Anion gap: 12 (ref 5–15)
BUN: <5 mg/dL — ABNORMAL LOW (ref 8–23)
CO2: 26 mmol/L (ref 22–32)
Calcium: 8.6 mg/dL — ABNORMAL LOW (ref 8.9–10.3)
Chloride: 98 mmol/L (ref 98–111)
Creatinine, Ser: 0.67 mg/dL (ref 0.44–1.00)
GFR calc Af Amer: >60 mL/min (ref >60)
GFR calc non Af Amer: >60 mL/min (ref >60)
Glucose, Bld: 159 mg/dL — ABNORMAL HIGH (ref 70–99)
Potassium: 3.5 mmol/L (ref 3.5–5.1)
Sodium: 136 mmol/L (ref 135–145)

## 2017-10-01 LAB — GLUCOSE, CAPILLARY
Glucose-Capillary: 130 mg/dL — ABNORMAL HIGH (ref 70–99)
Glucose-Capillary: 118 mg/dL — ABNORMAL HIGH (ref 70–99)
Glucose-Capillary: 112 mg/dL — ABNORMAL HIGH (ref 70–99)
Glucose-Capillary: 129 mg/dL — ABNORMAL HIGH (ref 70–99)

## 2017-10-01 MED ORDER — MIDAZOLAM HCL 2 MG/2ML IJ SOLN
INTRAMUSCULAR | Status: AC
  Filled 2017-10-01: qty 2

## 2017-10-01 MED ORDER — LIDOCAINE HCL (PF) 1 % IJ SOLN
0.0000 mL | Freq: Once | INTRAMUSCULAR | Status: DC | PRN
  Filled 2017-10-01: qty 30

## 2017-10-01 MED ORDER — MIDAZOLAM HCL 2 MG/2ML IJ SOLN
INTRAMUSCULAR | Status: AC | PRN
  Administered 2017-10-01: 1 mg via INTRAVENOUS

## 2017-10-01 MED ORDER — POTASSIUM CHLORIDE CRYS ER 20 MEQ PO TBCR
40.0000 meq | EXTENDED_RELEASE_TABLET | Freq: Two times a day (BID) | ORAL | Status: AC
  Administered 2017-10-01 (×2): 40 meq via ORAL
  Filled 2017-10-01 (×2): qty 2

## 2017-10-01 MED ORDER — LIDOCAINE HCL 1 % IJ SOLN
INTRAMUSCULAR | Status: AC | PRN
  Administered 2017-10-01: 10 mL

## 2017-10-01 MED ORDER — FENTANYL CITRATE (PF) 100 MCG/2ML IJ SOLN
INTRAMUSCULAR | Status: AC
  Filled 2017-10-01: qty 2

## 2017-10-01 MED ORDER — FENTANYL CITRATE (PF) 100 MCG/2ML IJ SOLN
INTRAMUSCULAR | Status: AC | PRN
  Administered 2017-10-01: 25 ug via INTRAVENOUS

## 2017-10-01 MED ORDER — LIDOCAINE HCL 1 % IJ SOLN
INTRAMUSCULAR | Status: AC
  Filled 2017-10-01: qty 20

## 2017-10-01 MED ORDER — ENOXAPARIN SODIUM 40 MG/0.4ML ~~LOC~~ SOLN
40.0000 mg | Freq: Every day | SUBCUTANEOUS | Status: DC
  Administered 2017-10-02 – 2017-10-08 (×7): 40 mg via SUBCUTANEOUS
  Filled 2017-10-01 (×7): qty 0.4

## 2017-10-01 NOTE — Procedures (Signed)
Interventional Radiology Procedure Note  Procedure:  Right chest tube, 3F for ptx.  Re-inflated after placement on 33mm mmHg.    Complications: None  Recommendations:  - To 62mm, continuous on wall - VIR will follow - Stable back to 4E - Routine wound care   Signed,  Dulcy Fanny. Earleen Newport, DO

## 2017-10-01 NOTE — Discharge Summary (Addendum)
Physician Discharge Summary  Patient ID: Rhonda Hughes MRN: [id number] DOB/AGE: 10-16-1940 77 y.o.  Admit date: 09/24/2017 Discharge date: 10/09/2017  Admission Diagnoses: Severe coronary artery disease  Discharge Diagnoses:  Active Problems:   S/P CABG x 4   Patient Active Problem List   Diagnosis Date Noted  . 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  . Chest pain with moderate risk for cardiac etiology 09/17/2017  . Lung nodule 09/17/2017  . Liver nodule 09/17/2017  . Unstable angina ([place]) 09/16/2017  . Benign essential HTN   . AKI (acute kidney injury) ([place]) 05/16/2017  . Dehydration symptoms 05/16/2017  . Acute kidney injury ([place]) 05/16/2017  . Constipation 05/12/2017  . Coronary artery disease involving native coronary artery of native heart with unstable angina pectoris ([place])   . Esophageal reflux   . CAD (coronary artery disease), native coronary artery 04/24/2015  . Hyponatremia 04/24/2015  . Pain in the chest   . Exertional angina ([place]) 03/08/2012  . Crescendo angina ([place]) 03/08/2012  . Hypokalemia 03/08/2012  . HTN (hypertension)   . Chest pain, atypical 10/13/2011  . At risk for coronary artery disease 10/13/2011  . 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 Barrett's esophagus 02/25/2011  . Rectal pressure 06/18/2010  . Bowel habit changes 06/18/2010   History the present illness: Rhonda Hughes is a 77 yo white female with known history of HTN, Hyperlipidemia, Anxiety, Barrett esophagus. 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 She has an extensive cardiac history dating back to 2013 at which time she underwent PCI x 2 to her LAD. She had a repeat cath in 2014 and again underwent cardiac catheterization with subsequent stent placement to the OM branch. In November of 2014 she presented with complaints of fatigue and shortness of breath. Myoview stress test was negative and repeat cath  showed patent stents. She underwent repeat catheterization in April 2016 which showed stable CAD. She was admitted to [facility] on [facility]15 with dyspnea, chest pain with moderate exertion, and fatigue. Her troponins during that admission was negative. She had no acute EKG changes. Stress test was obtained and was felt to be low risk with preserved EF. She was again evaluated in April with continued dyspnea and chest pressure. She was set up for repeat cardiac catheterization, however the patient canceled. She again presented for follow up in 05/2015 with continued complaints of chest pressure and continued dyspnea. Cardiac catheterization showed moderate non-obstructive CAD. The patient again presented for routine follow up on 09/13/2017. She continued to complain of chest pressure and shortness of breath with exertion. The patient is in the epigastric area and comes and goes. She denied N/V, diaphoresis, and palpitations. It was felt the patient would require cardiac catheterization as she was continuing to have chest discomfort. Most recently similar in nature to prior to her stents being placed. This was performed today and showed and showed multivessel CAD with LM involvement. It was felt coronary bypass grafting would be indicated and TCTS consult was requested. He was seen by Dr. Darcey Nora who evaluated the patient and her studies and agree with recommendations to proceed with CABG.  She was briefly discharged prior to proceeding with surgery for a Plavix washout.   Discharged Condition: good  Hospital Course: The patient was readmitted and on 09/24/2017 she was taken to the operating room where she underwent the below described procedure.  She tolerated well  was taken to the surgical intensive care unit in stable condition.  Postoperative hospital course:  Patient has progressed well overall.  She has maintained stable hemodynamics and was weaned from inotropic support without  difficulty.  She was weaned from the ventilator without difficulty.  She has had some postoperative PVCs.  Blood sugars have been under adequate control using standard measures.  She is not a diagnosed diabetic.  Hemoglobin A1c is 5.8 on no medications preoperatively.  She did have some postoperative volume overload but responded well to diuretics.  She has a stable acute blood loss anemia.  The one particular difficulties had during the postoperative period is bilateral pneumothoraces requiring prolonged chest tube treatment.  This required long-term management of the tubes and significant subcutaneous air which over time did gradually resolve and the chest tubes have been discontinued.  At the time of discharge the patient is felt to be quite stable.  Consults: Interventional radiology  Significant Diagnostic Studies: Routine postoperative labs and serial chest x-rays.  Treatments: surgery:   OPERATIVE REPORT  DATE OF PROCEDURE:  09/24/2017  PROCEDURE PERFORMED:   1.  Coronary artery bypass grafting x4 (left internal mammary artery to left anterior descending, saphenous vein graft to ramus intermedius, saphenous vein graft to circumflex marginal, saphenous vein graft to right coronary artery). 2.  Endoscopic harvest of right leg greater saphenous vein.  SURGEON:  Ivin Poot, MD  ASSISTANT:  Jadene Pierini, PA-C.  ANESTHESIA:  General by Dr. Spero Geralds.  PREOPERATIVE DIAGNOSIS:  Unstable angina, left main stenosis, history of previous percutaneous coronary intervention for diffuse coronary artery disease, history of hypertension and recently diagnosed left upper lobe 2 cm lobular density suspicious for  lung cancer.  POSTOPERATIVE DIAGNOSIS:  Unstable angina, left main stenosis, history of previous percutaneous coronary intervention for diffuse coronary artery disease, history of hypertension and recently diagnosed left upper lobe 2 cm lobular density suspicious for  lung  cancer.    Discharge Exam: Blood pressure (!) 134/58, pulse 85, temperature 98.3 F (36.8 C), temperature source Oral, resp. rate 18, height 5\' 2"  (1.575 m), weight 53.5 kg, SpO2 95 %.  General appearance: alert, cooperative and no distress Heart: regular rate and rhythm Lungs: clear to auscultation bilaterally Abdomen: soft, non-tender; bowel sounds normal; no masses,  no organomegaly Extremities: edema trace Wound: clean and dry  Disposition: Home   Discharge Instructions    Amb Referral to Cardiac Rehabilitation   Complete by:  As directed    To [place]   Diagnosis:  CABG   CABG X ___:  4     Allergies as of 10/09/2017      Reactions   Adhesive [tape] Other (See Comments)   Turns skin red   Sulfa Antibiotics Other (See Comments)   Childhood allergy      Medication List    STOP taking these medications   clopidogrel 75 MG tablet Commonly known as:  PLAVIX   isosorbide mononitrate 30 MG 24 hr tablet Commonly known as:  IMDUR   lisinopril-hydrochlorothiazide 20-12.5 MG tablet Commonly known as:  PRINZIDE,ZESTORETIC   potassium chloride SA 20 MEQ tablet Commonly known as:  K-DUR,KLOR-CON   REFRESH 1.4-0.6 % Soln Generic drug:  Polyvinyl Alcohol-Povidone PF     TAKE these medications   ALPRAZolam 0.25 MG tablet Commonly known as:  XANAX Take 0.25 mg by mouth 2 (two) times daily as needed for anxiety.   aspirin 325 MG EC tablet Take 1 tablet (325 mg total) by mouth daily.  What changed:    medication strength  how much to take   atorvastatin 20 MG tablet Commonly known as:  LIPITOR Take 1 tablet (20 mg total) by mouth at bedtime.   diphenhydramine-acetaminophen 25-500 MG Tabs tablet Commonly known as:  TYLENOL PM Take 2 tablets by mouth at bedtime as needed (for sleep).   docusate sodium 100 MG capsule Commonly known as:  COLACE Take 200 mg by mouth daily as needed for mild constipation.   lisinopril 5 MG tablet Commonly known as:   PRINIVIL,ZESTRIL Take 1 tablet (5 mg total) by mouth daily.   magnesium oxide 400 (241.3 Mg) MG tablet Commonly known as:  MAG-OX Take 0.5 tablets (200 mg total) by mouth daily.   memantine 10 MG tablet Commonly known as:  NAMENDA Take 10 mg by mouth 2 (two) times daily.   metoprolol tartrate 25 MG tablet Commonly known as:  LOPRESSOR Take 0.5 tablets (12.5 mg total) by mouth 2 (two) times daily.   nitroGLYCERIN 0.4 MG SL tablet Commonly known as:  NITROSTAT Place 1 tablet (0.4 mg total) under the tongue every 5 (five) minutes as needed for chest pain.   pantoprazole 20 MG tablet Commonly known as:  PROTONIX Take 20 mg by mouth daily.   traMADol 50 MG tablet Commonly known as:  ULTRAM Take 1 tablet (50 mg total) by mouth every 4 (four) hours as needed for moderate pain.   venlafaxine XR 37.5 MG 24 hr capsule Commonly known as:  EFFEXOR-XR Take 37.5 mg by mouth at bedtime.   zolpidem 10 MG tablet Commonly known as:  AMBIEN Take 10 mg by mouth at bedtime.      Follow-up Information    Ivin Poot, MD Follow up.   Specialty:  Cardiothoracic Surgery Why:  Appointment to see the surgeon on 11/10/2017 at 12:30 PM.  Please obtain a chest x-ray at [facility] at 12 noon.  [facility] is located in the same office complex on the first floor. Contact information: [address]  [place] [address] [phone]        Lyda Jester M, PA-C Follow up on 10/19/2017.   Specialties:  Cardiology, Radiology Why:  Appointment is at 3:00  Contact information: [address] [place] [address] [phone]         The patient has been discharged on:   1.Beta Blocker:  Yes [ y  ]                              No   [   ]                              If No, reason:  2.Ace Inhibitor/ARB: Yes [   ]                                     No  [ y   ]                                     If No, reason:  3.Statin:   Yes [  y ]  No  [   ]                  If No, reason:  4.Ecasa:  Yes  [ y  ]                  No   [   ]                  If No, reason:  Signed:  Original note by Jadene Pierini PA-C  [place] 10/09/2017, 8:49 AM   patient examined and medical record reviewed,agree with above note. [name] 10/26/2017

## 2017-10-01 NOTE — Consult Note (Signed)
Chief Complaint: Patient was seen in consultation today for right chest tube placement at the request of Dr Einar Grad   Supervising Physician: Corrie Mckusick  Patient Status: [facility] - In-pt  History of Present Illness: Rhonda Hughes is a 77 y.o. female   CABG 1 week ago Bilat PTX Left chest tube placed in IR 9/11  Now worsening SOB Rt PTX enlarging Request for Rt chest tube placement  Imaging reviewed with Dr Henrene Dodge procedure   Past Medical History:  Diagnosis Date  . AKI (acute kidney injury) ([place]) 05/16/2017  . Anxiety   . Barrett's esophagus without dysplasia   . CAD (coronary artery disease)    a. LHC 10/16/11: dLM 20%, mLAD 90%, pOM1 30%, mOM1 70% (FFR not hemodynamically significant), prox and mid RCA 30%, EF 65%;  b. PCI 10/16/11: Promus DES x 2 to mLAD  . COPD (chronic obstructive pulmonary disease) ([place])   . Depression   . Diverticulosis   . Essential hypertension   . Family history of adverse reaction to anesthesia    mother had "breathing problems"  . GERD (gastroesophageal reflux disease)   . Hyperlipidemia   . Rectocele     Past Surgical History:  Procedure Laterality Date  . ABDOMINAL HYSTERECTOMY    . CARDIAC CATHETERIZATION    . CARDIAC CATHETERIZATION N/A 06/21/2015   Procedure: Right/Left Heart Cath and Coronary Angiography;  Surgeon: [name], MD;  Location: [place] CV LAB;  Service: Cardiovascular;  Laterality: N/A;  . CARDIAC CATHETERIZATION  09/16/2017  . COLONOSCOPY  01/2008   Multiple diverticula in desc and sigm colon  . CORONARY ARTERY BYPASS GRAFT N/A 09/24/2017   Procedure: CORONARY ARTERY BYPASS GRAFTING (CABG) x4 using mammary artery and saphenous vein. LIMA to LAD, SVG to RCA, SVG to RAMUS, SVT to OM. Endoscopic saphenous vein harvest.;  Surgeon: Ivin Poot, MD;  Location: [place];  Service: Open Heart Surgery;  Laterality: N/A;  . ESOPHAGOGASTRODUODENOSCOPY  01/2008   Barrett's esophagus, gastritis, no  h.pylori, due follow-up 01/2011  . ESOPHAGOGASTRODUODENOSCOPY  04/06/2011   Procedure: ESOPHAGOGASTRODUODENOSCOPY (EGD);  Surgeon: Danie Binder, MD;  [facility];  Service: Endoscopy;  Laterality: N/A;  8:30  . FLEXIBLE SIGMOIDOSCOPY  08/11/2010 RECTAL PAIN/PRESSURE   SML IH, TICS  . FRACTIONAL FLOW RESERVE WIRE N/A 10/16/2011   Procedure: FRACTIONAL FLOW RESERVE WIRE;  Surgeon: [name], MD;  Location: [facility] CATH LAB;  Service: Cardiovascular;  Laterality: N/A;  . LEFT HEART CATH AND CORONARY ANGIOGRAPHY N/A 09/16/2017   Procedure: LEFT HEART CATH AND CORONARY ANGIOGRAPHY;  Surgeon: [name], MD;  Location: [place] [facility];  Service: Cardiovascular;  Laterality: N/A;  . LEFT HEART CATHETERIZATION WITH CORONARY ANGIOGRAM N/A 03/07/2012   Procedure: LEFT HEART CATHETERIZATION WITH CORONARY ANGIOGRAM;  Surgeon: [name], MD;  Location: [facility] CATH LAB;  Service: Cardiovascular;  Laterality: N/A;  . LEFT HEART CATHETERIZATION WITH CORONARY ANGIOGRAM N/A 12/14/2012   Procedure: LEFT HEART CATHETERIZATION WITH CORONARY ANGIOGRAM;  Surgeon: [name], MD;  Location: [facility] CATH LAB;  Service: Cardiovascular;  Laterality: N/A;  . LEFT HEART CATHETERIZATION WITH CORONARY ANGIOGRAM N/A 05/07/2014   Procedure: LEFT HEART CATHETERIZATION WITH CORONARY ANGIOGRAM;  Surgeon: [name], MD;  Location: [facility] CATH LAB;  Service: Cardiovascular;  Laterality: N/A;  . PARTIAL HYSTERECTOMY  1980s  . TEE WITHOUT CARDIOVERSION N/A 09/24/2017   Procedure: TRANSESOPHAGEAL ECHOCARDIOGRAM (TEE);  Surgeon: Prescott Gum, Collier Salina, MD;  Location: [place];  Service: Open Heart Surgery;  Laterality: N/A;    Allergies: Adhesive [tape] and Sulfa antibiotics  Medications: Prior to Admission medications   Medication Sig Start Date End Date Taking? Authorizing Provider  ALPRAZolam (XANAX) 0.25 MG tablet Take 0.25 mg by mouth 2 (two) times daily as needed for anxiety.      [provider]  aspirin EC 81 MG EC tablet Take 1 tablet (81 mg total) by mouth daily. 09/18/17   Barrett, Evelene Croon, PA-C  atorvastatin (LIPITOR) 20 MG tablet Take 1 tablet (20 mg total) by mouth at bedtime. 09/17/17   Barrett, Evelene Croon, PA-C  clopidogrel (PLAVIX) 75 MG tablet Take 75 mg by mouth daily.    [provider]  diphenhydramine-acetaminophen (TYLENOL PM) 25-500 MG TABS Take 2 tablets by mouth at bedtime as needed (for sleep).     [provider]  docusate sodium (COLACE) 100 MG capsule Take 200 mg by mouth daily as needed for mild constipation.     [provider]  isosorbide mononitrate (IMDUR) 30 MG 24 hr tablet TAKE 1 TABLET BY MOUTH  DAILY Patient taking differently: Take 30 mg by mouth at bedtime.  01/04/17   [name], MD  lisinopril-hydrochlorothiazide (PRINZIDE,ZESTORETIC) 20-12.5 MG tablet Take 1 tablet by mouth daily. Hold medication until follow-up with PCP Patient taking differently: Take 1 tablet by mouth at bedtime.  05/19/17   Barton Dubois, MD  magnesium oxide (MAG-OX) 400 (241.3 Mg) MG tablet Take 0.5 tablets (200 mg total) by mouth daily. 09/18/17   Barrett, Evelene Croon, PA-C  memantine (NAMENDA) 10 MG tablet Take 10 mg by mouth 2 (two) times daily.  05/04/17   [provider]  nitroGLYCERIN (NITROSTAT) 0.4 MG SL tablet Place 1 tablet (0.4 mg total) under the tongue every 5 (five) minutes as needed for chest pain. 09/17/17   Barrett, Evelene Croon, PA-C  pantoprazole (PROTONIX) 20 MG tablet Take 20 mg by mouth daily.    [provider]  Polyvinyl Alcohol-Povidone PF (REFRESH) 1.4-0.6 % SOLN Place 1-2 drops into both eyes 2 (two) times daily as needed (for eye irritation).     [provider]  potassium chloride SA (K-DUR,KLOR-CON) 20 MEQ tablet Take 1 tablet (20 mEq total) by mouth daily. 05/19/17   Barton Dubois, MD  venlafaxine XR (EFFEXOR-XR) 37.5 MG 24 hr capsule Take 37.5 mg by mouth at bedtime.     [provider]  zolpidem (AMBIEN) 10 MG tablet Take 10 mg by mouth at bedtime.    [provider]  niacin (NIASPAN) 500 MG CR tablet Take 500 mg by mouth at bedtime.    04/01/11  [provider]     Family History  Problem Relation Age of Onset  . Coronary artery disease Father 28  . Heart attack Father 8  . GI problems Mother        Perforated colon   . Coronary artery disease Paternal Aunt   . Coronary artery disease Paternal Uncle   . Colon cancer Neg Hx   . Gastric cancer Neg Hx   . Esophageal cancer Neg Hx     Social History   Socioeconomic History  . Marital status: Married    Spouse name: Not on file  . Number of children: 1  . Years of education: Not on file  . Highest education level: Not on file  Occupational History  . Occupation: Retired Child psychotherapist    Comment: retired    Fish farm manager: RETIRED  Social Needs  .  Financial resource strain: Not on file  . Food insecurity:    Worry: Not on file    Inability: Not on file  . Transportation needs:    Medical: Not on file    Non-medical: Not on file  Tobacco Use  . Smoking status: Former Smoker    Packs/day: 1.00    Years: 53.00    Pack years: 53.00    Types: Cigarettes    Last attempt to quit: 03/20/2007    Years since quitting: 10.5  . Smokeless tobacco: Never Used  Substance and Sexual Activity  . Alcohol use: No  . Drug use: No  . Sexual activity: Yes  Lifestyle  . Physical activity:    Days per week: Not on file    Minutes per session: Not on file  . Stress: Not on file  Relationships  . Social connections:    Talks on phone: Not on file    Gets together: Not on file    Attends religious service: Not on file    Active member of club or organization: Not on file    Attends meetings of clubs or organizations: Not on file    Relationship status: Not on file  Other Topics Concern  . Not on file  Social History Narrative  . Not on file     Review of Systems: A  12 point ROS discussed and pertinent positives are indicated in the HPI above.  All other systems are negative.  Review of Systems  Constitutional: Positive for activity change, appetite change and fatigue. Negative for fever.  Respiratory: Positive for shortness of breath.   Cardiovascular: Positive for chest pain.  Gastrointestinal: Negative for abdominal pain.  Neurological: Positive for weakness.  Psychiatric/Behavioral: Negative for behavioral problems and confusion.    Vital Signs: BP 133/76 (BP Location: Right Arm)   Pulse 100   Temp 98.2 F (36.8 C) (Oral)   Resp (!) 23   Ht 5\' 2"  (1.575 m)   Wt 120 lb 12.8 oz (54.8 kg) Comment: Scale A  SpO2 95%   BMI 22.09 kg/m   Physical Exam  Constitutional: She is oriented to person, place, and time.  Cardiovascular: Normal rate and regular rhythm.  Pulmonary/Chest:  diminished sounds right Mild distress  Abdominal: Soft. Bowel sounds are normal.  Musculoskeletal: Normal range of motion.  Neurological: She is alert and oriented to person, place, and time.  Skin: Skin is warm and dry.  Left chest tube intact Clean and dry  Psychiatric: She has a normal mood and affect. Her behavior is normal. Judgment and thought content normal.  Vitals reviewed.   Imaging: Dg Chest 2 View  Result Date: 09/17/2017 CLINICAL DATA:  Chest pain.  Shortness of breath. EXAM: CHEST - 2 VIEW COMPARISON:  04/24/2015. FINDINGS: Mediastinum hilar structures normal. Lungs are clear. No pleural effusion or pneumothorax. Prominent 2 cm nodular density noted over the left upper lung. Malignancy including primary lung cancer could present this fashion. CT scratched it contrast-enhanced chest CT suggested for further evaluation. No pleural effusion. No pneumothorax. IMPRESSION: 2 cm nodular opacity noted over the left upper lung. This is suspicious for tumor such as a primary lung cancer. IV contrast-enhanced chest CT is suggested for further evaluation. These  results will be called to the ordering clinician or representative by the Radiologist Assistant, and communication documented in the PACS or zVision Dashboard. Electronically Signed   By: Marcello Moores  Register   On: 09/17/2017 12:46   Ct Chest W Contrast  Result Date: 09/17/2017 CLINICAL DATA:  77 year old female with 2 centimeter apparent lung nodule on chest radiographs today performed for chest pain and shortness of breath. EXAM: CT CHEST WITH CONTRAST TECHNIQUE: Multidetector CT imaging of the chest was performed during intravenous contrast administration. CONTRAST:  29mL ISOVUE-300 IOPAMIDOL (ISOVUE-300) INJECTION 61% COMPARISON:  Chest radiographs 1111 hours today. CTA chest 10/21/2011. FINDINGS: Cardiovascular: Extensive calcified coronary artery atherosclerosis and/or stent(s). Calcified aortic atherosclerosis. The central pulmonary arteries appear patent. No cardiomegaly or pericardial effusion. Mediastinum/Nodes: Negative.  No lymphadenopathy. Lungs/Pleura: There is an oval, generally smooth but mildly lobulated left upper lobe pulmonary nodule encompassing 18 x 40 x 20 millimeters (AP by transverse by CC) as seen on series 5, image 56 and coronal image 60. This appears to be enhancing, and has partially engulfed a left upper lobe pulmonary artery branch as seen on coronal images 59 through 61 (arrows). This is new since 2013, and there is underlying chronic centrilobular emphysema. No other pulmonary nodule. The major airways are patent. No pleural effusion. Upper Abdomen: Small indeterminate oval 11 mm low-density area in the central liver which might also be new since 2013 has low to intermediate density. No other lesion in the visible liver. Negative visible gallbladder, spleen, pancreas, adrenal glands, kidneys, and proximal bowel in the upper abdomen. Diverticulosis at the splenic flexure of colon. Musculoskeletal: No acute or suspicious osseous lesion identified. IMPRESSION: 1. Chronic Emphysema  (ICD10-J43.9) with confirmed 2 cm left upper lobe nodule, new since 2013 and highly suspicious for bronchogenic carcinoma despite relatively smooth contours. Note that this lesion has partially engulfed a pulmonary artery branch as demonstrated on coronal images 59-61. 2. No associated mediastinal lymphadenopathy. There is a small 1.1 cm indeterminate low-density lesion in the central liver, also seemingly new since 2013. 3. Calcified coronary artery and Aortic Atherosclerosis (ICD10-I70.0). Electronically Signed   By: Genevie Ann M.D.   On: 09/17/2017 17:24   Dg Chest Port 1 View  Result Date: 10/01/2017 CLINICAL DATA:  Follow-up chest tube EXAM: PORTABLE CHEST 1 VIEW COMPARISON:  09/30/2017 FINDINGS: Cardiac shadow is stable. Postsurgical changes are again noted. The large bore chest tube on the left is been removed in the interval. Small bore pigtail catheter remains in place. A tiny apical pneumothorax is identified on the left. This is stable from the previous exam. On the right side however, the previously seen small apical pneumothorax has enlarged significantly now with excursion of approximately 1.5 cm both apically and inferolaterally. Mild left basilar atelectasis is noted with small effusion. No acute bony abnormality is noted. IMPRESSION: Stable left apical pneumothorax following large bore chest tube removal. There is been interval increase of the right-sided pneumothorax as described above. These results will be called to the ordering clinician or representative by the Radiologist Assistant, and communication documented in the PACS or zVision Dashboard. Electronically Signed   By: Inez Catalina M.D.   On: 10/01/2017 08:40   Dg Chest Port 1 View  Result Date: 09/30/2017 CLINICAL DATA:  Chest tube placement EXAM: PORTABLE CHEST 1 VIEW COMPARISON:  09/29/2017 FINDINGS: Left large bore chest tube remains in place, unchanged. Interval placement of pigtail left pleural drainage catheter with slight  decreased size of the left pneumothorax. Approximately 5-10% left pneumothorax remains. Small right apical pneumothorax again noted, slightly decreased in size. Subcutaneous emphysema noted in the left chest wall and both sides at the neck base. Hyperinflation/COPD. Bibasilar atelectasis and small effusions. Prior CABG. Borderline heart size. IMPRESSION: Bilateral pneumothoraces again noted, slightly  decreased in size. Interval placement of 2nd left chest tube. COPD.  Bibasilar atelectasis and small effusions. Electronically Signed   By: Rolm Baptise M.D.   On: 09/30/2017 07:59   Dg Chest Port 1 View  Result Date: 09/29/2017 CLINICAL DATA:  Pneumothoraces EXAM: PORTABLE CHEST 1 VIEW COMPARISON:  September 28, 2017 FINDINGS: Chest tube present on the left. There are apical pneumothoraces bilaterally, essentially stable from 1 day prior. There is extensive subcutaneous air, more on the left than on the right. There is bibasilar atelectasis. No consolidation. Heart size and pulmonary vascularity are normal. No adenopathy. Patient is status post coronary artery bypass grafting. There is aortic atherosclerosis. No appreciable bone lesions. IMPRESSION: Apical pneumothoraces on each side with extensive subcutaneous air. Chest tube present on the left, unchanged in position. Bibasilar atelectasis. No consolidation. Stable cardiac silhouette. There is aortic atherosclerosis. Aortic Atherosclerosis (ICD10-I70.0). Electronically Signed   By: Lowella Grip III M.D.   On: 09/29/2017 07:52   Dg Chest Port 1 View  Result Date: 09/28/2017 CLINICAL DATA:  Chest 2. Status cardiac surgery. Shortness of breath. EXAM: PORTABLE CHEST 1 VIEW COMPARISON:  Multiple recent previous exams. FINDINGS: 0817 hours. Left chest tube remains in place with tiny residual left apical pneumothorax. Small right apical pneumothorax also noted on the left. Known left upper lobe pulmonary nodule again identified. The cardiopericardial silhouette  is within normal limits for size. Basilar atelectasis evident with probable tiny bilateral pleural effusions. Subcutaneous emphysema noted in the supraclavicular regions bilaterally and bilateral lower neck. IMPRESSION: 1. Small apical pneumothorax bilaterally with left chest tube in place. 2. Basilar atelectasis with tiny bilateral pleural effusions. 3. Left upper lobe pulmonary nodule. These results will be called to the ordering clinician or representative by the Radiologist Assistant, and communication documented in the PACS or zVision Dashboard. Electronically Signed   By: Misty Stanley M.D.   On: 09/28/2017 09:49   Dg Chest Port 1 View  Result Date: 09/27/2017 CLINICAL DATA:  Left-sided face swelling post op coronary bypass grafting EXAM: PORTABLE CHEST 1 VIEW COMPARISON:  09/27/2017, 09/26/2017, 09/25/2017 FINDINGS: Postsurgical changes of the mediastinum. Left-sided chest tube with slightly curved appearance of the distal tubing at the left apex. Small left-sided pleural effusion with basilar atelectasis. Aortic atherosclerosis. Moderate emphysema within the left chest wall and left neck. IMPRESSION: 1. Small left pleural effusion and basilar atelectasis without significant change. 2. Continued moderate left chest wall and supraclavicular soft tissue emphysema. Electronically Signed   By: Donavan Foil M.D.   On: 09/27/2017 18:56   Dg Chest Port 1 View  Result Date: 09/27/2017 CLINICAL DATA:  Chest tube. EXAM: PORTABLE CHEST 1 VIEW COMPARISON:  Chest x-ray from same day at 4:57 a.m. FINDINGS: Interval removal of the mediastinal drain. Unchanged left-sided chest tube. Stable cardiomediastinal silhouette status post CABG. Unchanged small left pleural effusion and left lower lobe subsegmental atelectasis. No definite pneumothorax. Unchanged subcutaneous emphysema in the lower neck and left chest wall. No acute osseous abnormality. IMPRESSION: 1. Unchanged left-sided chest tube and subcutaneous emphysema  in the lower neck and left chest wall. No definite pneumothorax. 2. Interval removal of the mediastinal drain. 3. Unchanged small left pleural effusion. Electronically Signed   By: Titus Dubin M.D.   On: 09/27/2017 12:05   Dg Chest Port 1 View  Result Date: 09/27/2017 CLINICAL DATA:  77 year old female post CABG.  Subsequent encounter. EXAM: PORTABLE CHEST 1 VIEW COMPARISON:  09/26/2017. FINDINGS: Post CABG. Heart slightly enlarged. Small left-sided pleural effusion and  left base subsegmental atelectasis. Left-sided chest tube and mediastinal drain remain in place. Interval development of subcutaneous emphysema lower neck, left supraclavicular region and left lateral chest wall. This may reflect changes of pneumomediastinum dissecting superiorly. Difficult to completely exclude tiny left apical pneumothorax although I suspect this represents overlying subcutaneous gas. Calcified aorta. Removal right introducer catheter. Epicardial leads remain in place. No acute osseous abnormality. IMPRESSION: 1. Post CABG. Heart slightly enlarged. Small left-sided pleural effusion and left base subsegmental atelectasis. 2. Left-sided chest tube and mediastinal drain remain in place. Interval development of subcutaneous emphysema lower neck, left supraclavicular region and left lateral chest wall. This may reflect changes of pneumomediastinum dissecting superiorly. Difficult to completely exclude tiny left apical pneumothorax although I suspect this represents overlying subcutaneous gas. 3.  Aortic Atherosclerosis (ICD10-I70.0). These results will be called to the ordering clinician or representative by the Radiologist Assistant, and communication documented in the PACS or zVision Dashboard. Electronically Signed   By: Genia Del M.D.   On: 09/27/2017 08:18   Dg Chest Port 1 View  Result Date: 09/26/2017 CLINICAL DATA:  History of CABG EXAM: PORTABLE CHEST 1 VIEW COMPARISON:  September 25, 2016 FINDINGS: The PA catheter  is been removed. A right IJ sheath remains. The left chest tube remains as well. No pneumothorax. Stable cardiomediastinal silhouette. Persistent small effusion and atelectasis on the left. Mild interstitial opacity on the left may represent mild asymmetric pulmonary venous congestion, unchanged. No other interval change. IMPRESSION: 1. Support apparatus as above. 2. Possible mild pulmonary venous congestion, asymmetric to the left. 3. Tiny left effusion with underlying atelectasis. Electronically Signed   By: Dorise Bullion III M.D   On: 09/26/2017 07:55   Dg Chest Port 1 View  Result Date: 09/25/2017 CLINICAL DATA:  CABG. EXAM: PORTABLE CHEST 1 VIEW COMPARISON:  Chest x-ray from yesterday. FINDINGS: Interval removal of the endotracheal and enteric tubes. Unchanged Swan-Ganz catheter and mediastinal and left chest tubes. Stable cardiomediastinal silhouette status post CABG. Unchanged small left pleural effusion and left lower lobe atelectasis. Improving edema in the left lung. Slightly improved aeration at the right lung base. The lungs remain hyperinflated with emphysematous changes. No pneumothorax. No acute osseous abnormality. IMPRESSION: 1. Improving asymmetric edema involving the left lung. Slightly improved aeration at the right lung base. 2. Unchanged small left pleural effusion and adjacent atelectasis. 3. COPD. Electronically Signed   By: Titus Dubin M.D.   On: 09/25/2017 07:49   Dg Chest Port 1 View  Result Date: 09/24/2017 CLINICAL DATA:  Prior CABG. EXAM: PORTABLE CHEST 1 VIEW COMPARISON:  CT 09/17/2017. FINDINGS: Endotracheal tube noted with tip 3.8 cm above the carina. NG tube noted with its tip below left hemidiaphragm. Swan-Ganz catheter noted with tip in the pulmonary outflow tract. Mediastinal drainage catheter noted with its tip over the upper mid mediastinum. Left chest tube noted with its tip kinked in the left apex. No pneumothorax. Prior CABG. Mild cardiomegaly. Bilateral  pulmonary infiltrates/edema and small left pleural effusion. Findings suggest heart failure. Bibasilar atelectasis. IMPRESSION: 1. Lines and tubes as above. It should be noted that the left chest tube tip is kinked in the apex. No pneumothorax. 2. Prior CABG. Findings suggesting congestive heart failure bilateral pulmonary interstitial edema and left-sided pleural effusion bibasilar atelectasis. Electronically Signed   By: Marcello Moores  Register   On: 09/24/2017 14:56   Ct Image Guided Drainage By Percutaneous Catheter  Result Date: 09/30/2017 CLINICAL DATA:  Recent CABG with persistent left pneumothorax despite chest tube.  CT-guided pleural drain catheter requested. EXAM: CT GUIDED LEFT CHEST TUBE PLACEMENT ANESTHESIA/SEDATION: Intravenous Fentanyl and Versed were administered as conscious sedation during continuous monitoring of the patient's level of consciousness and physiological / cardiorespiratory status by the radiology RN, with a total moderate sedation time of 16 minutes. PROCEDURE: The procedure, risks, benefits, and alternatives were explained to the patient. Questions regarding the procedure were encouraged and answered. The patient understands and consents to the procedure. Select axial scans through the thorax were obtained. An appropriate skin entry site was determined and marked. The operative field was prepped with chlorhexidinein a sterile fashion, and a sterile drape was applied covering the operative field. A sterile gown and sterile gloves were used for the procedure. Local anesthesia was provided with 1% Lidocaine. Under CT fluoroscopic guidance, a 19 gauge percutaneous entry needle was advanced into the anterior/apical pneumothorax. Gas could be aspirated. An Amplatz guidewire advanced easily. Tract dilated to facilitate placement of a 10 French pigtail catheter, directed to the lung apex. CT confirmed appropriate positioning. The catheter was secured externally with 0 Prolene suture and  StatLock and placed to Pleur-evac drainage. The patient tolerated the procedure well. COMPLICATIONS: None immediate FINDINGS: Loculated anterior and apical left pneumothorax was identified. Extensive subcutaneous emphysema. Pigtail drain catheter placed to the lung apex from an anterior approach as above. IMPRESSION: 1. Technically successful CT-guided left chest tube placement. Electronically Signed   By: Lucrezia Europe M.D.   On: 09/30/2017 10:32    Labs:  CBC: Recent Labs    09/26/17 0423 09/27/17 0248 09/29/17 0226 10/01/17 0400  WBC 15.4* 14.9* 11.0* 13.7*  HGB 9.3* 9.1* 9.4* 9.9*  HCT 28.7* 27.8* 29.3* 30.2*  PLT 147* 170 237 393    COAGS: Recent Labs    09/21/17 0902 09/24/17 1352 09/29/17 1132  INR 0.96 1.39 1.03  APTT 36 31  --     BMP: Recent Labs    09/27/17 0248 09/29/17 0226 09/30/17 0436 10/01/17 0400  NA 133* 136 137 136  K 4.0 2.8* 3.5 3.5  CL 95* 96* 100 98  CO2 29 30 29 26   GLUCOSE 99 101* 104* 159*  BUN 7* <5* 5* <5*  CALCIUM 8.3* 8.3* 8.4* 8.6*  CREATININE 0.67 0.61 0.71 0.67  GFRNONAA >60 >60 >60 >60  GFRAA >60 >60 >60 >60    LIVER FUNCTION TESTS: Recent Labs    05/19/17 0619 09/17/17 0409 09/21/17 0902  BILITOT  --  1.0 0.8  AST  --  20 23  ALT  --  16 19  ALKPHOS  --  70 77  PROT  --  6.4* 7.5  ALBUMIN 3.0* 3.5 4.1    TUMOR MARKERS: No results for input(s): AFPTM, CEA, CA199, CHROMGRNA in the last 8760 hours.  Assessment and Plan:  CABG 1 week ago Post Bilat PTX Left Chest tube placed in IR 9/11 R PTX enlarging and increased SOB Now scheduled for R Chest tube placement LD Lovenox 9/22 10 pm Risks and benefits discussed with the patient including bleeding, infection, damage to adjacent structures, and sepsis. All of the patient's questions were answered, patient is agreeable to proceed. Consent signed and in chart.  Thank you for this interesting consult.  I greatly enjoyed meeting KIRSTINE JACQUIN and look forward to  participating in their care.  A copy of this report was sent to the requesting provider on this date.  Electronically Signed: Lavonia Drafts, PA-C 10/01/2017, 9:01 AM   I spent a total of 40  Minutes    in face to face in clinical consultation, greater than 50% of which was counseling/coordinating care for right chest tube placement

## 2017-10-01 NOTE — Progress Notes (Addendum)
[address]       [place] [address]             [phone]      7 Days Post-Op Procedure(s) (LRB): CORONARY ARTERY BYPASS GRAFTING (CABG) x4 using mammary artery and saphenous vein. LIMA to LAD, SVG to RCA, SVG to RAMUS, SVT to OM. Endoscopic saphenous vein harvest. (N/A) TRANSESOPHAGEAL ECHOCARDIOGRAM (TEE) (N/A) Subjective: Anxious and more SOB this am  Objective: Vital signs in last 24 hours: Temp:  [98.2 F (36.8 C)-98.9 F (37.2 C)] 98.5 F (36.9 C) (09/12 2344) Pulse Rate:  [79-106] 96 (09/12 2344) Cardiac Rhythm: Sinus tachycardia (09/13 0701) Resp:  [18-29] 20 (09/12 2344) BP: (96-148)/(46-72) 130/60 (09/12 2344) SpO2:  [95 %-100 %] 95 % (09/12 2344) Weight:  [54.8 kg] 54.8 kg (09/13 0308)  Hemodynamic parameters for last 24 hours:    Intake/Output from previous day: 09/12 0701 - 09/13 0700 In: 782.3 [P.O.:580; I.V.:2.3; IV Piggyback:200] Out: 1435 [Urine:1425; Chest Tube:10] Intake/Output this shift: No intake/output data recorded.  General appearance: alert, cooperative, fatigued and no distress Heart: regular rate and rhythm Lungs: clear to auscultation bilaterally Abdomen: benign Extremities: no edema Wound: incis healing well  Lab Results: Recent Labs    09/29/17 0226 10/01/17 0400  WBC 11.0* 13.7*  HGB 9.4* 9.9*  HCT 29.3* 30.2*  PLT 237 393   BMET:  Recent Labs    09/30/17 0436 10/01/17 0400  NA 137 136  K 3.5 3.5  CL 100 98  CO2 29 26  GLUCOSE 104* 159*  BUN 5* <5*  CREATININE 0.71 0.67  CALCIUM 8.4* 8.6*    PT/INR:  Recent Labs    09/29/17 1132  LABPROT 13.4  INR 1.03   ABG    Component Value Date/Time   PHART 7.387 09/25/2017 0350   HCO3 27.4 09/25/2017 0350   TCO2 27 09/25/2017 1724   ACIDBASEDEF 1.0 06/21/2015 1259   O2SAT 98.1 09/25/2017 0350   CBG (last 3)  Recent Labs    09/30/17 1614 09/30/17 2115 10/01/17 0631  GLUCAP 116* 129* 130*    Meds Scheduled Meds: . aspirin EC   325 mg Oral Daily   Or  . aspirin  324 mg Per Tube Daily  . atorvastatin  20 mg Oral QHS  . bisacodyl  10 mg Oral Daily   Or  . bisacodyl  10 mg Rectal Daily  . chlorhexidine gluconate (MEDLINE KIT)  15 mL Mouth Rinse BID  . Chlorhexidine Gluconate Cloth  6 each Topical Daily  . Chlorhexidine Gluconate Cloth  6 each Topical Q0600  . docusate sodium  200 mg Oral Daily  . enoxaparin (LOVENOX) injection  40 mg Subcutaneous QHS  . feeding supplement (ENSURE ENLIVE)  237 mL Oral TID WC  . insulin aspart  0-24 Units Subcutaneous TID AC & HS  . mouth rinse  15 mL Mouth Rinse BID  . memantine  10 mg Oral BID  . metoprolol tartrate  12.5 mg Oral BID   Or  . metoprolol tartrate  12.5 mg Per Tube BID  . pantoprazole  40 mg Oral Daily  . sodium chloride flush  10-40 mL Intracatheter Q12H  . sodium chloride flush  3 mL Intravenous Q12H  . sodium chloride flush  5 mL Intracatheter Q8H  . venlafaxine XR  37.5 mg Oral QHS   Continuous Infusions: . sodium chloride 250 mL (10/01/17 0556)  . cefTAZidime (FORTAZ)  IV 1 g (10/01/17 0557)  PRN Meds:.sodium chloride, ALPRAZolam, lidocaine (PF), metoprolol tartrate, morphine injection, ondansetron (ZOFRAN) IV, oxyCODONE, potassium chloride, sodium chloride flush, sodium chloride flush, traMADol, zolpidem  Xrays Dg Chest Port 1 View  Result Date: 09/30/2017 CLINICAL DATA:  Chest tube placement EXAM: PORTABLE CHEST 1 VIEW COMPARISON:  09/29/2017 FINDINGS: Left large bore chest tube remains in place, unchanged. Interval placement of pigtail left pleural drainage catheter with slight decreased size of the left pneumothorax. Approximately 5-10% left pneumothorax remains. Small right apical pneumothorax again noted, slightly decreased in size. Subcutaneous emphysema noted in the left chest wall and both sides at the neck base. Hyperinflation/COPD. Bibasilar atelectasis and small effusions. Prior CABG. Borderline heart size. IMPRESSION: Bilateral pneumothoraces  again noted, slightly decreased in size. Interval placement of 2nd left chest tube. COPD.  Bibasilar atelectasis and small effusions. Electronically Signed   By: Rolm Baptise M.D.   On: 09/30/2017 07:59   Ct Image Guided Drainage By Percutaneous Catheter  Result Date: 09/30/2017 CLINICAL DATA:  Recent CABG with persistent left pneumothorax despite chest tube. CT-guided pleural drain catheter requested. EXAM: CT GUIDED LEFT CHEST TUBE PLACEMENT ANESTHESIA/SEDATION: Intravenous Fentanyl and Versed were administered as conscious sedation during continuous monitoring of the patient's level of consciousness and physiological / cardiorespiratory status by the radiology RN, with a total moderate sedation time of 16 minutes. PROCEDURE: The procedure, risks, benefits, and alternatives were explained to the patient. Questions regarding the procedure were encouraged and answered. The patient understands and consents to the procedure. Select axial scans through the thorax were obtained. An appropriate skin entry site was determined and marked. The operative field was prepped with chlorhexidinein a sterile fashion, and a sterile drape was applied covering the operative field. A sterile gown and sterile gloves were used for the procedure. Local anesthesia was provided with 1% Lidocaine. Under CT fluoroscopic guidance, a 19 gauge percutaneous entry needle was advanced into the anterior/apical pneumothorax. Gas could be aspirated. An Amplatz guidewire advanced easily. Tract dilated to facilitate placement of a 10 French pigtail catheter, directed to the lung apex. CT confirmed appropriate positioning. The catheter was secured externally with 0 Prolene suture and StatLock and placed to Pleur-evac drainage. The patient tolerated the procedure well. COMPLICATIONS: None immediate FINDINGS: Loculated anterior and apical left pneumothorax was identified. Extensive subcutaneous emphysema. Pigtail drain catheter placed to the lung apex  from an anterior approach as above. IMPRESSION: 1. Technically successful CT-guided left chest tube placement. Electronically Signed   By: Lucrezia Europe M.D.   On: 09/30/2017 10:32    Assessment/Plan: S/P Procedure(s) (LRB): CORONARY ARTERY BYPASS GRAFTING (CABG) x4 using mammary artery and saphenous vein. LIMA to LAD, SVG to RCA, SVG to RAMUS, SVT to OM. Endoscopic saphenous vein harvest. (N/A) TRANSESOPHAGEAL ECHOCARDIOGRAM (TEE) (N/A)  1 air leak is small but present, left lung is re-expanded, however the right pneumothorax is larger 2 some increase in leukocytosis, she is on fortaz 3 H/H is stable 4 replace K+ 5 hemodyn stable with PVC's    LOS: 7 days    [name] 10/01/2017  recurrent bilateral pneumothorax with bilateral pigtail catheters, now with recurrent subcutaneous air. 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

## 2017-10-02 ENCOUNTER — Inpatient Hospital Stay (HOSPITAL_COMMUNITY): Payer: Medicare Other

## 2017-10-02 LAB — BASIC METABOLIC PANEL
ANION GAP: 8 (ref 5–15)
BUN: <5 mg/dL — ABNORMAL LOW (ref 8–23)
CALCIUM: 9 mg/dL (ref 8.9–10.3)
CO2: 29 mmol/L (ref 22–32)
Chloride: 96 mmol/L — ABNORMAL LOW (ref 98–111)
Creatinine, Ser: 0.67 mg/dL (ref 0.44–1.00)
Glucose, Bld: 123 mg/dL — ABNORMAL HIGH (ref 70–99)
Potassium: 4.6 mmol/L (ref 3.5–5.1)
SODIUM: 133 mmol/L — AB (ref 135–145)

## 2017-10-02 LAB — GLUCOSE, CAPILLARY
Glucose-Capillary: 113 mg/dL — ABNORMAL HIGH (ref 70–99)
Glucose-Capillary: 116 mg/dL — ABNORMAL HIGH (ref 70–99)
Glucose-Capillary: 108 mg/dL — ABNORMAL HIGH (ref 70–99)
Glucose-Capillary: 114 mg/dL — ABNORMAL HIGH (ref 70–99)

## 2017-10-02 NOTE — Progress Notes (Addendum)
[address]       [place] [address]             [phone]      8 Days Post-Op Procedure(s) (LRB): CORONARY ARTERY BYPASS GRAFTING (CABG) x4 using mammary artery and saphenous vein. LIMA to LAD, SVG to RCA, SVG to RAMUS, SVT to OM. Endoscopic saphenous vein harvest. (N/A) TRANSESOPHAGEAL ECHOCARDIOGRAM (TEE) (N/A) Subjective: Primary c/o is of increased sub q air , especially in face  Objective: Vital signs in last 24 hours: Temp:  [98.2 F (36.8 C)-99.5 F (37.5 C)] 98.4 F (36.9 C) (09/14 0416) Pulse Rate:  [98-114] 103 (09/14 0416) Cardiac Rhythm: Sinus tachycardia (09/13 2000) Resp:  [15-38] 38 (09/14 0435) BP: (95-150)/(34-87) 128/69 (09/14 0416) SpO2:  [95 %-99 %] 96 % (09/13 2358) Weight:  [54.5 kg] 54.5 kg (09/14 0435)  Hemodynamic parameters for last 24 hours:    Intake/Output from previous day: 09/13 0701 - 09/14 0700 In: 24.2 [I.V.:0.1; IV Piggyback:24.1] Out: 1574 [Urine:1500; Chest Tube:74] Intake/Output this shift: No intake/output data recorded.  General appearance: alert, cooperative and no distress Heart: regular rate and rhythm Lungs: clear to auscultation bilaterally Abdomen: benign Extremities: no edema, + subq air into hands Wound: incis healing well  Lab Results: Recent Labs    10/01/17 0400  WBC 13.7*  HGB 9.9*  HCT 30.2*  PLT 393   BMET:  Recent Labs    09/30/17 0436 10/01/17 0400  NA 137 136  K 3.5 3.5  CL 100 98  CO2 29 26  GLUCOSE 104* 159*  BUN 5* <5*  CREATININE 0.71 0.67  CALCIUM 8.4* 8.6*    PT/INR:  Recent Labs    09/29/17 1132  LABPROT 13.4  INR 1.03   ABG    Component Value Date/Time   PHART 7.387 09/25/2017 0350   HCO3 27.4 09/25/2017 0350   TCO2 27 09/25/2017 1724   ACIDBASEDEF 1.0 06/21/2015 1259   O2SAT 98.1 09/25/2017 0350   CBG (last 3)  Recent Labs    10/01/17 1603 10/01/17 2133 10/02/17 0632  GLUCAP 112* 129* 113*    Meds Scheduled Meds: . aspirin EC  325  mg Oral Daily   Or  . aspirin  324 mg Per Tube Daily  . atorvastatin  20 mg Oral QHS  . bisacodyl  10 mg Oral Daily   Or  . bisacodyl  10 mg Rectal Daily  . chlorhexidine gluconate (MEDLINE KIT)  15 mL Mouth Rinse BID  . Chlorhexidine Gluconate Cloth  6 each Topical Daily  . Chlorhexidine Gluconate Cloth  6 each Topical Q0600  . docusate sodium  200 mg Oral Daily  . enoxaparin (LOVENOX) injection  40 mg Subcutaneous QHS  . feeding supplement (ENSURE ENLIVE)  237 mL Oral TID WC  . insulin aspart  0-24 Units Subcutaneous TID AC & HS  . mouth rinse  15 mL Mouth Rinse BID  . memantine  10 mg Oral BID  . metoprolol tartrate  12.5 mg Oral BID   Or  . metoprolol tartrate  12.5 mg Per Tube BID  . pantoprazole  40 mg Oral Daily  . sodium chloride flush  10-40 mL Intracatheter Q12H  . sodium chloride flush  3 mL Intravenous Q12H  . sodium chloride flush  5 mL Intracatheter Q8H  . venlafaxine XR  37.5 mg Oral QHS   Continuous Infusions: . sodium chloride 200 mL (10/01/17 1336)  . cefTAZidime (FORTAZ)  IV 1 g (10/02/17  47)   PRN Meds:.sodium chloride, ALPRAZolam, lidocaine (PF), metoprolol tartrate, morphine injection, ondansetron (ZOFRAN) IV, oxyCODONE, sodium chloride flush, sodium chloride flush, traMADol, zolpidem  Xrays Dg Chest Port 1 View  Result Date: 10/02/2017 CLINICAL DATA:  Status post right chest tube placement, follow-up pneumothorax EXAM: PORTABLE CHEST 1 VIEW COMPARISON:  10/01/2017 FINDINGS: Cardiac shadow is mildly accentuated by the portable technique. Postsurgical changes are again seen. Bilateral pigtail catheters are now noted over the lung apices. The previously seen right-sided pneumothorax appears resolved. The left-sided pneumothorax is stable but not as well appreciated due to overlying subcutaneous emphysema. No bony abnormality is seen. Persistent left basilar atelectasis is noted. IMPRESSION: Resolution of previously seen right pneumothorax following chest tube  placement. Stable left apical pneumothorax. Stable left basilar atelectasis. Electronically Signed   By: Inez Catalina M.D.   On: 10/02/2017 07:23   Dg Chest Port 1 View  Result Date: 10/01/2017 CLINICAL DATA:  Follow-up chest tube EXAM: PORTABLE CHEST 1 VIEW COMPARISON:  09/30/2017 FINDINGS: Cardiac shadow is stable. Postsurgical changes are again noted. The large bore chest tube on the left is been removed in the interval. Small bore pigtail catheter remains in place. A tiny apical pneumothorax is identified on the left. This is stable from the previous exam. On the right side however, the previously seen small apical pneumothorax has enlarged significantly now with excursion of approximately 1.5 cm both apically and inferolaterally. Mild left basilar atelectasis is noted with small effusion. No acute bony abnormality is noted. IMPRESSION: Stable left apical pneumothorax following large bore chest tube removal. There is been interval increase of the right-sided pneumothorax as described above. These results will be called to the ordering clinician or representative by the Radiologist Assistant, and communication documented in the PACS or zVision Dashboard. Electronically Signed   By: Inez Catalina M.D.   On: 10/01/2017 08:40   Ir Perc Pleural Drain W/indwell Cath W/img Guide  Result Date: 10/01/2017 INDICATION: 77 year old female with a history of right-sided pneumothorax persisting. She has been referred for thoracostomy tube placement. EXAM: IMAGE GUIDED PLACEMENT OF RIGHT CHEST TUBE MEDICATIONS: None ANESTHESIA/SEDATION: Fentanyl 0.5 mcg IV; Versed 25 mg IV Moderate Sedation Time:  10 minutes The patient was continuously monitored during the procedure by the interventional radiology nurse under my direct supervision. COMPLICATIONS: None PROCEDURE: The procedure, risks, benefits, and alternatives were explained to the patient/patient's family, who provided informed consent on the patient's behalf. Specific  risks that were addressed included bleeding, infection, ongoing pneumothorax, need for further procedure/surgery, chance of hemorrhage, hemoptysis, cardiopulmonary collapse, death. Questions regarding the procedure were encouraged and answered. The patient understands and consents to the procedure. Patient was positioned in the right anterior oblique position on the IR table and scout image of the chest was performed for planning purposes. The right mid axillary line at the level of the nipple was identified, and prepped and draped in the usual sterile fashion. The skin and subcutaneous tissues were generously infiltrated 1% lidocaine for local anesthesia. A single wall needle was then used to enter the pleural space with aspiration of air. An 035 guidewire was advanced to the apex of the lung under fluoroscopy. Dilation of the skin tract was performed over the wire, and then modified Seldinger technique was used to place a 10 French pigtail catheter . Catheter was attached to water seal chamber and suction was applied confirming a operational chest tube. Retention suture was placed.  Sterile dressing was placed. Patient tolerated the procedure well and remained  hemodynamically stable throughout. No complications were encountered and no significant blood loss was encounter IMPRESSION: Status post right-sided chest tube placement. Signed, Dulcy Fanny. Dellia Nims, RPVI [facility] Radiology Electronically Signed   By: Corrie Mckusick D.O.   On: 10/01/2017 13:21    Assessment/Plan: S/P Procedure(s) (LRB): CORONARY ARTERY BYPASS GRAFTING (CABG) x4 using mammary artery and saphenous vein. LIMA to LAD, SVG to RCA, SVG to RAMUS, SVT to OM. Endoscopic saphenous vein harvest. (N/A) TRANSESOPHAGEAL ECHOCARDIOGRAM (TEE) (N/A)   LOS: 8 days   1 overall doing well, hemodyn stable in sinus with some pvc's 2 Increased sub q air into face, right chest tube without air leak, left  tube with small air leak, cont to suction, CXR improved appearance 3 d/c epw's today 4 No new labs, BS good control   [name] 10/02/2017 Pager [phone]  Pacing wires removed today without difficulty Continue oral diet Continue nasal oxygen 2 L Vital signs every 4 hours No evidence of airway impairment Follow-up chest x-ray in a.m. patient examined and medical record reviewed,agree with above note. [name] 10/02/2017

## 2017-10-02 NOTE — Progress Notes (Signed)
Epicardial pacing wires discontinued.  Pt on bedrest until 1115.

## 2017-10-02 NOTE — Progress Notes (Signed)
CARDIAC REHAB PHASE I   Pt with increasing Subq air today.  Will f/u on Monday for ambulation.   Noel Christmas, RN 10/02/2017 10:51 AM

## 2017-10-02 NOTE — Progress Notes (Signed)
Referring Physician(s): Ivin Poot  Supervising Physician: Sandi Mariscal  Patient Status:  [facility] - In-pt  Chief Complaint: None  Subjective:  Bilateral pneumothorax s/p left chest tube placement 09/29/2017 by Dr. Vernard Gambles AND right chest tube placement 10/01/2017 by Dr. Earleen Newport. Patient awake and alert sitting in bed. Accompanied by 2 friends/family members. She has significant facial edema. Bilateral chest tubes c/d/i.   Allergies: Adhesive [tape] and Sulfa antibiotics  Medications: Prior to Admission medications   Medication Sig Start Date End Date Taking? Authorizing Provider  ALPRAZolam (XANAX) 0.25 MG tablet Take 0.25 mg by mouth 2 (two) times daily as needed for anxiety.     [provider]  aspirin EC 81 MG EC tablet Take 1 tablet (81 mg total) by mouth daily. 09/18/17   Barrett, Evelene Croon, PA-C  atorvastatin (LIPITOR) 20 MG tablet Take 1 tablet (20 mg total) by mouth at bedtime. 09/17/17   Barrett, Evelene Croon, PA-C  clopidogrel (PLAVIX) 75 MG tablet Take 75 mg by mouth daily.    [provider]  diphenhydramine-acetaminophen (TYLENOL PM) 25-500 MG TABS Take 2 tablets by mouth at bedtime as needed (for sleep).     [provider]  docusate sodium (COLACE) 100 MG capsule Take 200 mg by mouth daily as needed for mild constipation.     [provider]  isosorbide mononitrate (IMDUR) 30 MG 24 hr tablet TAKE 1 TABLET BY MOUTH  DAILY Patient taking differently: Take 30 mg by mouth at bedtime.  01/04/17   [name], MD  lisinopril-hydrochlorothiazide (PRINZIDE,ZESTORETIC) 20-12.5 MG tablet Take 1 tablet by mouth daily. Hold medication until follow-up with PCP Patient taking differently: Take 1 tablet by mouth at bedtime.  05/19/17   Barton Dubois, MD  magnesium oxide (MAG-OX) 400 (241.3 Mg) MG tablet Take 0.5 tablets (200 mg total) by mouth daily. 09/18/17   Barrett, Evelene Croon, PA-C  memantine (NAMENDA) 10 MG tablet Take 10 mg by mouth 2  (two) times daily.  05/04/17   [provider]  nitroGLYCERIN (NITROSTAT) 0.4 MG SL tablet Place 1 tablet (0.4 mg total) under the tongue every 5 (five) minutes as needed for chest pain. 09/17/17   Barrett, Evelene Croon, PA-C  pantoprazole (PROTONIX) 20 MG tablet Take 20 mg by mouth daily.    [provider]  Polyvinyl Alcohol-Povidone PF (REFRESH) 1.4-0.6 % SOLN Place 1-2 drops into both eyes 2 (two) times daily as needed (for eye irritation).     [provider]  potassium chloride SA (K-DUR,KLOR-CON) 20 MEQ tablet Take 1 tablet (20 mEq total) by mouth daily. 05/19/17   Barton Dubois, MD  venlafaxine XR (EFFEXOR-XR) 37.5 MG 24 hr capsule Take 37.5 mg by mouth at bedtime.    [provider]  zolpidem (AMBIEN) 10 MG tablet Take 10 mg by mouth at bedtime.    [provider]  niacin (NIASPAN) 500 MG CR tablet Take 500 mg by mouth at bedtime.    04/01/11  [provider]     Vital Signs: BP (!) 120/56   Pulse (!) 103   Temp 98.4 F (36.9 C) (Oral)   Resp 17   Ht 5\' 2"  (1.575 m)   Wt 120 lb 3.2 oz (54.5 kg)   SpO2 97%   BMI 21.98 kg/m   Physical Exam  Constitutional: She is oriented to person, place, and time. She appears well-developed and well-nourished. No distress.  Pulmonary/Chest: Effort normal. No respiratory distress.  Bilateral chest tube sites without  erythema, drainage, or tenderness; approximately 80 mL of blood tinged fluid in PleurVac bilaterally; no airleak bilaterally.  Neurological: She is alert and oriented to person, place, and time.  Skin: Skin is warm and dry.  Psychiatric: She has a normal mood and affect. Her behavior is normal. Judgment and thought content normal.  Nursing note and vitals reviewed.   Imaging: Dg Chest Port 1 View  Result Date: 10/02/2017 CLINICAL DATA:  Status post right chest tube placement, follow-up pneumothorax EXAM: PORTABLE CHEST 1 VIEW COMPARISON:  10/01/2017 FINDINGS: Cardiac shadow is  mildly accentuated by the portable technique. Postsurgical changes are again seen. Bilateral pigtail catheters are now noted over the lung apices. The previously seen right-sided pneumothorax appears resolved. The left-sided pneumothorax is stable but not as well appreciated due to overlying subcutaneous emphysema. No bony abnormality is seen. Persistent left basilar atelectasis is noted. IMPRESSION: Resolution of previously seen right pneumothorax following chest tube placement. Stable left apical pneumothorax. Stable left basilar atelectasis. Electronically Signed   By: Inez Catalina M.D.   On: 10/02/2017 07:23   Dg Chest Port 1 View  Result Date: 10/01/2017 CLINICAL DATA:  Follow-up chest tube EXAM: PORTABLE CHEST 1 VIEW COMPARISON:  09/30/2017 FINDINGS: Cardiac shadow is stable. Postsurgical changes are again noted. The large bore chest tube on the left is been removed in the interval. Small bore pigtail catheter remains in place. A tiny apical pneumothorax is identified on the left. This is stable from the previous exam. On the right side however, the previously seen small apical pneumothorax has enlarged significantly now with excursion of approximately 1.5 cm both apically and inferolaterally. Mild left basilar atelectasis is noted with small effusion. No acute bony abnormality is noted. IMPRESSION: Stable left apical pneumothorax following large bore chest tube removal. There is been interval increase of the right-sided pneumothorax as described above. These results will be called to the ordering clinician or representative by the Radiologist Assistant, and communication documented in the PACS or zVision Dashboard. Electronically Signed   By: Inez Catalina M.D.   On: 10/01/2017 08:40   Dg Chest Port 1 View  Result Date: 09/30/2017 CLINICAL DATA:  Chest tube placement EXAM: PORTABLE CHEST 1 VIEW COMPARISON:  09/29/2017 FINDINGS: Left large bore chest tube remains in place, unchanged. Interval placement  of pigtail left pleural drainage catheter with slight decreased size of the left pneumothorax. Approximately 5-10% left pneumothorax remains. Small right apical pneumothorax again noted, slightly decreased in size. Subcutaneous emphysema noted in the left chest wall and both sides at the neck base. Hyperinflation/COPD. Bibasilar atelectasis and small effusions. Prior CABG. Borderline heart size. IMPRESSION: Bilateral pneumothoraces again noted, slightly decreased in size. Interval placement of 2nd left chest tube. COPD.  Bibasilar atelectasis and small effusions. Electronically Signed   By: Rolm Baptise M.D.   On: 09/30/2017 07:59   Dg Chest Port 1 View  Result Date: 09/29/2017 CLINICAL DATA:  Pneumothoraces EXAM: PORTABLE CHEST 1 VIEW COMPARISON:  September 28, 2017 FINDINGS: Chest tube present on the left. There are apical pneumothoraces bilaterally, essentially stable from 1 day prior. There is extensive subcutaneous air, more on the left than on the right. There is bibasilar atelectasis. No consolidation. Heart size and pulmonary vascularity are normal. No adenopathy. Patient is status post coronary artery bypass grafting. There is aortic atherosclerosis. No appreciable bone lesions. IMPRESSION: Apical pneumothoraces on each side with extensive subcutaneous air. Chest tube present on the left, unchanged in position. Bibasilar atelectasis. No consolidation. Stable cardiac silhouette. There  is aortic atherosclerosis. Aortic Atherosclerosis (ICD10-I70.0). Electronically Signed   By: Lowella Grip III M.D.   On: 09/29/2017 07:52   Ct Image Guided Drainage By Percutaneous Catheter  Result Date: 09/30/2017 CLINICAL DATA:  Recent CABG with persistent left pneumothorax despite chest tube. CT-guided pleural drain catheter requested. EXAM: CT GUIDED LEFT CHEST TUBE PLACEMENT ANESTHESIA/SEDATION: Intravenous Fentanyl and Versed were administered as conscious sedation during continuous monitoring of the  patient's level of consciousness and physiological / cardiorespiratory status by the radiology RN, with a total moderate sedation time of 16 minutes. PROCEDURE: The procedure, risks, benefits, and alternatives were explained to the patient. Questions regarding the procedure were encouraged and answered. The patient understands and consents to the procedure. Select axial scans through the thorax were obtained. An appropriate skin entry site was determined and marked. The operative field was prepped with chlorhexidinein a sterile fashion, and a sterile drape was applied covering the operative field. A sterile gown and sterile gloves were used for the procedure. Local anesthesia was provided with 1% Lidocaine. Under CT fluoroscopic guidance, a 19 gauge percutaneous entry needle was advanced into the anterior/apical pneumothorax. Gas could be aspirated. An Amplatz guidewire advanced easily. Tract dilated to facilitate placement of a 10 French pigtail catheter, directed to the lung apex. CT confirmed appropriate positioning. The catheter was secured externally with 0 Prolene suture and StatLock and placed to Pleur-evac drainage. The patient tolerated the procedure well. COMPLICATIONS: None immediate FINDINGS: Loculated anterior and apical left pneumothorax was identified. Extensive subcutaneous emphysema. Pigtail drain catheter placed to the lung apex from an anterior approach as above. IMPRESSION: 1. Technically successful CT-guided left chest tube placement. Electronically Signed   By: Lucrezia Europe M.D.   On: 09/30/2017 10:32   Ir Perc Pleural Drain W/indwell Cath W/img Guide  Result Date: 10/01/2017 INDICATION: 77 year old female with a history of right-sided pneumothorax persisting. She has been referred for thoracostomy tube placement. EXAM: IMAGE GUIDED PLACEMENT OF RIGHT CHEST TUBE MEDICATIONS: None ANESTHESIA/SEDATION: Fentanyl 0.5 mcg IV; Versed 25 mg IV Moderate Sedation Time:  10 minutes The patient was  continuously monitored during the procedure by the interventional radiology nurse under my direct supervision. COMPLICATIONS: None PROCEDURE: The procedure, risks, benefits, and alternatives were explained to the patient/patient's family, who provided informed consent on the patient's behalf. Specific risks that were addressed included bleeding, infection, ongoing pneumothorax, need for further procedure/surgery, chance of hemorrhage, hemoptysis, cardiopulmonary collapse, death. Questions regarding the procedure were encouraged and answered. The patient understands and consents to the procedure. Patient was positioned in the right anterior oblique position on the IR table and scout image of the chest was performed for planning purposes. The right mid axillary line at the level of the nipple was identified, and prepped and draped in the usual sterile fashion. The skin and subcutaneous tissues were generously infiltrated 1% lidocaine for local anesthesia. A single wall needle was then used to enter the pleural space with aspiration of air. An 035 guidewire was advanced to the apex of the lung under fluoroscopy. Dilation of the skin tract was performed over the wire, and then modified Seldinger technique was used to place a 10 French pigtail catheter . Catheter was attached to water seal chamber and suction was applied confirming a operational chest tube. Retention suture was placed.  Sterile dressing was placed. Patient tolerated the procedure well and remained hemodynamically stable throughout. No complications were encountered and no significant blood loss was encounter IMPRESSION: Status post right-sided chest tube placement. Signed,  Dulcy Fanny. Dellia Nims, RPVI [facility] Inter[facility] Radiology [facility] Radiology Electronically Signed   By: Corrie Mckusick D.O.   On: 10/01/2017 13:21    Labs:  CBC: Recent Labs    09/26/17 0423 09/27/17 0248 09/29/17 0226 10/01/17 0400  WBC 15.4* 14.9*  11.0* 13.7*  HGB 9.3* 9.1* 9.4* 9.9*  HCT 28.7* 27.8* 29.3* 30.2*  PLT 147* 170 237 393    COAGS: Recent Labs    09/21/17 0902 09/24/17 1352 09/29/17 1132  INR 0.96 1.39 1.03  APTT 36 31  --     BMP: Recent Labs    09/29/17 0226 09/30/17 0436 10/01/17 0400 10/02/17 0644  NA 136 137 136 133*  K 2.8* 3.5 3.5 4.6  CL 96* 100 98 96*  CO2 30 29 26 29   GLUCOSE 101* 104* 159* 123*  BUN <5* 5* <5* <5*  CALCIUM 8.3* 8.4* 8.6* 9.0  CREATININE 0.61 0.71 0.67 0.67  GFRNONAA >60 >60 >60 >60  GFRAA >60 >60 >60 >60    LIVER FUNCTION TESTS: Recent Labs    05/19/17 0619 09/17/17 0409 09/21/17 0902  BILITOT  --  1.0 0.8  AST  --  20 23  ALT  --  16 19  ALKPHOS  --  70 77  PROT  --  6.4* 7.5  ALBUMIN 3.0* 3.5 4.1    Assessment and Plan:  Bilateral pneumothorax s/p left chest tube placement 09/29/2017 by Dr. Vernard Gambles AND right chest tube placement 10/01/2017 by Dr. Earleen Newport. Bilateral chest tubes stable with approximately 80 mL in each Pleurvac, no airleak bilaterally. Appreciate and agree with CTS management. IR to follow.   Electronically Signed: Earley Abide, PA-C 10/02/2017, 12:07 PM   I spent a total of 15 Minutes at the the patient's bedside AND on the patient's hospital floor or unit, greater than 50% of which was counseling/coordinating care for bilateral pneumothorax s/p bilateral chest tube placement.

## 2017-10-03 ENCOUNTER — Inpatient Hospital Stay (HOSPITAL_COMMUNITY): Payer: Medicare Other

## 2017-10-03 LAB — CBC
HCT: 30.4 % — ABNORMAL LOW (ref 36.0–46.0)
Hemoglobin: 9.7 g/dL — ABNORMAL LOW (ref 12.0–15.0)
MCH: 31 pg (ref 26.0–34.0)
MCHC: 31.9 g/dL (ref 30.0–36.0)
MCV: 97.1 fL (ref 78.0–100.0)
Platelets: 462 10*3/uL — ABNORMAL HIGH (ref 150–400)
RBC: 3.13 MIL/uL — ABNORMAL LOW (ref 3.87–5.11)
RDW: 13.3 % (ref 11.5–15.5)
WBC: 12.8 10*3/uL — ABNORMAL HIGH (ref 4.0–10.5)

## 2017-10-03 LAB — GLUCOSE, CAPILLARY
Glucose-Capillary: 97 mg/dL (ref 70–99)
Glucose-Capillary: 119 mg/dL — ABNORMAL HIGH (ref 70–99)
Glucose-Capillary: 103 mg/dL — ABNORMAL HIGH (ref 70–99)

## 2017-10-03 MED ORDER — MAGNESIUM HYDROXIDE 400 MG/5ML PO SUSP: 30.0000 mL | Freq: Every day | ORAL | Status: DC | PRN

## 2017-10-03 NOTE — Progress Notes (Addendum)
[address]       [place] [address]             [phone]      9 Days Post-Op Procedure(s) (LRB): CORONARY ARTERY BYPASS GRAFTING (CABG) x4 using mammary artery and saphenous vein. LIMA to LAD, SVG to RCA, SVG to RAMUS, SVT to OM. Endoscopic saphenous vein harvest. (N/A) TRANSESOPHAGEAL ECHOCARDIOGRAM (TEE) (N/A) Subjective: Feels better, facial sub q air is improving but remains significant  Objective: Vital signs in last 24 hours: Temp:  [98.3 F (36.8 C)-99 F (37.2 C)] 98.7 F (37.1 C) (09/15 0449) Pulse Rate:  [93-94] 93 (09/15 0449) Cardiac Rhythm: Sinus tachycardia (09/14 2016) Resp:  [11-20] 17 (09/15 0449) BP: (97-129)/(43-60) 113/57 (09/15 0449) SpO2:  [92 %-99 %] 92 % (09/15 0449) Weight:  [52.8 kg] 52.8 kg (09/15 0449)  Hemodynamic parameters for last 24 hours:    Intake/Output from previous day: 09/14 0701 - 09/15 0700 In: 0  Out: 20 [Chest Tube:20] Intake/Output this shift: No intake/output data recorded.  General appearance: alert, cooperative and no distress Heart: regular rate and rhythm Lungs: clear to auscultation bilaterally Abdomen: benign Extremities: no edema Wound: incis healing well  Lab Results: Recent Labs    10/01/17 0400 10/03/17 0440  WBC 13.7* 12.8*  HGB 9.9* 9.7*  HCT 30.2* 30.4*  PLT 393 462*   BMET:  Recent Labs    10/01/17 0400 10/02/17 0644  NA 136 133*  K 3.5 4.6  CL 98 96*  CO2 26 29  GLUCOSE 159* 123*  BUN <5* <5*  CREATININE 0.67 0.67  CALCIUM 8.6* 9.0    PT/INR: No results for input(s): LABPROT, INR in the last 72 hours. ABG    Component Value Date/Time   PHART 7.387 09/25/2017 0350   HCO3 27.4 09/25/2017 0350   TCO2 27 09/25/2017 1724   ACIDBASEDEF 1.0 06/21/2015 1259   O2SAT 98.1 09/25/2017 0350   CBG (last 3)  Recent Labs    10/02/17 1648 10/02/17 2138 10/03/17 0615  GLUCAP 108* 114* 97    Meds Scheduled Meds: . aspirin EC  325 mg Oral Daily   Or  . aspirin   324 mg Per Tube Daily  . atorvastatin  20 mg Oral QHS  . bisacodyl  10 mg Oral Daily   Or  . bisacodyl  10 mg Rectal Daily  . chlorhexidine gluconate (MEDLINE KIT)  15 mL Mouth Rinse BID  . Chlorhexidine Gluconate Cloth  6 each Topical Daily  . Chlorhexidine Gluconate Cloth  6 each Topical Q0600  . docusate sodium  200 mg Oral Daily  . enoxaparin (LOVENOX) injection  40 mg Subcutaneous QHS  . feeding supplement (ENSURE ENLIVE)  237 mL Oral TID WC  . insulin aspart  0-24 Units Subcutaneous TID AC & HS  . mouth rinse  15 mL Mouth Rinse BID  . memantine  10 mg Oral BID  . metoprolol tartrate  12.5 mg Oral BID   Or  . metoprolol tartrate  12.5 mg Per Tube BID  . pantoprazole  40 mg Oral Daily  . sodium chloride flush  10-40 mL Intracatheter Q12H  . sodium chloride flush  3 mL Intravenous Q12H  . sodium chloride flush  5 mL Intracatheter Q8H  . venlafaxine XR  37.5 mg Oral QHS   Continuous Infusions: . sodium chloride 200 mL (10/01/17 1336)  . cefTAZidime (FORTAZ)  IV 1 g (10/03/17 0616)   PRN Meds:.sodium chloride, ALPRAZolam, lidocaine (  PF), metoprolol tartrate, ondansetron (ZOFRAN) IV, oxyCODONE, sodium chloride flush, sodium chloride flush, traMADol, zolpidem  Xrays Dg Chest Port 1 View  Result Date: 10/02/2017 CLINICAL DATA:  Status post right chest tube placement, follow-up pneumothorax EXAM: PORTABLE CHEST 1 VIEW COMPARISON:  10/01/2017 FINDINGS: Cardiac shadow is mildly accentuated by the portable technique. Postsurgical changes are again seen. Bilateral pigtail catheters are now noted over the lung apices. The previously seen right-sided pneumothorax appears resolved. The left-sided pneumothorax is stable but not as well appreciated due to overlying subcutaneous emphysema. No bony abnormality is seen. Persistent left basilar atelectasis is noted. IMPRESSION: Resolution of previously seen right pneumothorax following chest tube placement. Stable left apical pneumothorax. Stable  left basilar atelectasis. Electronically Signed   By: Inez Catalina M.D.   On: 10/02/2017 07:23   Ir Perc Pleural Drain W/indwell Cath W/img Guide  Result Date: 10/01/2017 INDICATION: 77 year old female with a history of right-sided pneumothorax persisting. She has been referred for thoracostomy tube placement. EXAM: IMAGE GUIDED PLACEMENT OF RIGHT CHEST TUBE MEDICATIONS: None ANESTHESIA/SEDATION: Fentanyl 0.5 mcg IV; Versed 25 mg IV Moderate Sedation Time:  10 minutes The patient was continuously monitored during the procedure by the interventional radiology nurse under my direct supervision. COMPLICATIONS: None PROCEDURE: The procedure, risks, benefits, and alternatives were explained to the patient/patient's family, who provided informed consent on the patient's behalf. Specific risks that were addressed included bleeding, infection, ongoing pneumothorax, need for further procedure/surgery, chance of hemorrhage, hemoptysis, cardiopulmonary collapse, death. Questions regarding the procedure were encouraged and answered. The patient understands and consents to the procedure. Patient was positioned in the right anterior oblique position on the IR table and scout image of the chest was performed for planning purposes. The right mid axillary line at the level of the nipple was identified, and prepped and draped in the usual sterile fashion. The skin and subcutaneous tissues were generously infiltrated 1% lidocaine for local anesthesia. A single wall needle was then used to enter the pleural space with aspiration of air. An 035 guidewire was advanced to the apex of the lung under fluoroscopy. Dilation of the skin tract was performed over the wire, and then modified Seldinger technique was used to place a 10 French pigtail catheter . Catheter was attached to water seal chamber and suction was applied confirming a operational chest tube. Retention suture was placed.  Sterile dressing was placed. Patient tolerated the  procedure well and remained hemodynamically stable throughout. No complications were encountered and no significant blood loss was encounter IMPRESSION: Status post right-sided chest tube placement. Signed, Dulcy Fanny. Dellia Nims, RPVI Vascular and Interventional Radiology Specialists [facility] Radiology Electronically Signed   By: Corrie Mckusick D.O.   On: 10/01/2017 13:21    Assessment/Plan: S/P Procedure(s) (LRB): CORONARY ARTERY BYPASS GRAFTING (CABG) x4 using mammary artery and saphenous vein. LIMA to LAD, SVG to RCA, SVG to RAMUS, SVT to OM. Endoscopic saphenous vein harvest. (N/A) TRANSESOPHAGEAL ECHOCARDIOGRAM (TEE) (N/A)   1 chest tubes remain in place with no change in exam- small left sided air leak.  2 CXR is pending  3 leukocytosis is improving, tmax 99, day 5 of Fortaz 4 sugars controlled 5 hemodyn stable in sinus with some PVC's 6 sats ok on 1 liter 7 push rehab and pulm toilet as able  LOS: 9 days    Rhonda Hughes 10/03/2017 Pager [phone]  Chest x-ray image today personally reviewed with improved pneumothorax Decreased subcutaneous air on right side Continue bilateral pigtail catheter drainage to suction  Chest x-ray ordered for a.m.  patient examined and medical record reviewed,agree with above note. [name] 10/03/2017

## 2017-10-04 ENCOUNTER — Other Ambulatory Visit: Payer: Self-pay | Admitting: Cardiothoracic Surgery

## 2017-10-04 ENCOUNTER — Inpatient Hospital Stay (HOSPITAL_COMMUNITY): Payer: Medicare Other

## 2017-10-04 DIAGNOSIS — I25119 Atherosclerotic heart disease of native coronary artery with unspecified angina pectoris: Secondary | ICD-10-CM

## 2017-10-04 LAB — GLUCOSE, CAPILLARY
Glucose-Capillary: 104 mg/dL — ABNORMAL HIGH (ref 70–99)
Glucose-Capillary: 158 mg/dL — ABNORMAL HIGH (ref 70–99)
Glucose-Capillary: 102 mg/dL — ABNORMAL HIGH (ref 70–99)

## 2017-10-04 LAB — BASIC METABOLIC PANEL
Anion gap: 11 (ref 5–15)
BUN: 5 mg/dL — ABNORMAL LOW (ref 8–23)
CO2: 26 mmol/L (ref 22–32)
Calcium: 8.8 mg/dL — ABNORMAL LOW (ref 8.9–10.3)
Chloride: 95 mmol/L — ABNORMAL LOW (ref 98–111)
Creatinine, Ser: 0.71 mg/dL (ref 0.44–1.00)
GFR calc Af Amer: >60 mL/min (ref >60)
GFR calc non Af Amer: >60 mL/min (ref >60)
Glucose, Bld: 101 mg/dL — ABNORMAL HIGH (ref 70–99)
Potassium: 4.1 mmol/L (ref 3.5–5.1)
Sodium: 132 mmol/L — ABNORMAL LOW (ref 135–145)

## 2017-10-04 NOTE — Progress Notes (Signed)
Referring Physician(s): Dr Darcey Nora  Supervising Physician: Markus Daft  Patient Status:  [facility] - In-pt  Chief Complaint:  Post CABG Bilat PTX Left CT 9/11 Right CT 9 13  Subjective:  Bilat chest tubes in place Much less pain Breathing easier  CXR today:  IMPRESSION: 1. Increased atelectasis/effusion at the left lung base. 2. Small residual left pneumothorax, unchanged. 3. Slightly diminished subcutaneous emphysema.  Definite subcu emphysema Face is most effected Crackles when palpated   Allergies: Adhesive [tape] and Sulfa antibiotics  Medications: Prior to Admission medications   Medication Sig Start Date End Date Taking? Authorizing Provider  ALPRAZolam (XANAX) 0.25 MG tablet Take 0.25 mg by mouth 2 (two) times daily as needed for anxiety.     [provider]  aspirin EC 81 MG EC tablet Take 1 tablet (81 mg total) by mouth daily. 09/18/17   Barrett, Evelene Croon, PA-C  atorvastatin (LIPITOR) 20 MG tablet Take 1 tablet (20 mg total) by mouth at bedtime. 09/17/17   Barrett, Evelene Croon, PA-C  clopidogrel (PLAVIX) 75 MG tablet Take 75 mg by mouth daily.    [provider]  diphenhydramine-acetaminophen (TYLENOL PM) 25-500 MG TABS Take 2 tablets by mouth at bedtime as needed (for sleep).     [provider]  docusate sodium (COLACE) 100 MG capsule Take 200 mg by mouth daily as needed for mild constipation.     [provider]  isosorbide mononitrate (IMDUR) 30 MG 24 hr tablet TAKE 1 TABLET BY MOUTH  DAILY Patient taking differently: Take 30 mg by mouth at bedtime.  01/04/17   [name], MD  lisinopril-hydrochlorothiazide (PRINZIDE,ZESTORETIC) 20-12.5 MG tablet Take 1 tablet by mouth daily. Hold medication until follow-up with PCP Patient taking differently: Take 1 tablet by mouth at bedtime.  05/19/17   Barton Dubois, MD  magnesium oxide (MAG-OX) 400 (241.3 Mg) MG tablet Take 0.5 tablets (200 mg total) by mouth daily. 09/18/17    Barrett, Evelene Croon, PA-C  memantine (NAMENDA) 10 MG tablet Take 10 mg by mouth 2 (two) times daily.  05/04/17   [provider]  nitroGLYCERIN (NITROSTAT) 0.4 MG SL tablet Place 1 tablet (0.4 mg total) under the tongue every 5 (five) minutes as needed for chest pain. 09/17/17   Barrett, Evelene Croon, PA-C  pantoprazole (PROTONIX) 20 MG tablet Take 20 mg by mouth daily.    [provider]  Polyvinyl Alcohol-Povidone PF (REFRESH) 1.4-0.6 % SOLN Place 1-2 drops into both eyes 2 (two) times daily as needed (for eye irritation).     [provider]  potassium chloride SA (K-DUR,KLOR-CON) 20 MEQ tablet Take 1 tablet (20 mEq total) by mouth daily. 05/19/17   Barton Dubois, MD  venlafaxine XR (EFFEXOR-XR) 37.5 MG 24 hr capsule Take 37.5 mg by mouth at bedtime.    [provider]  zolpidem (AMBIEN) 10 MG tablet Take 10 mg by mouth at bedtime.    [provider]  niacin (NIASPAN) 500 MG CR tablet Take 500 mg by mouth at bedtime.    04/01/11  [provider]     Vital Signs: BP 115/88 (BP Location: Right Arm)   Pulse 98   Temp 98.3 F (36.8 C) (Oral)   Resp 14   Ht 5\' 2"  (1.575 m)   Wt 116 lb 6.5 oz (52.8 kg)   SpO2 90%   BMI 21.29 kg/m   Physical Exam  Pulmonary/Chest: Effort normal.  Skin: Skin is warm and dry.  subcu emphysema  Face is most effected ++ crackles when palpated NT  Rt CT without air leak Lt CT with small air leak  Sites are clean and dry NT no bleeding   Vitals reviewed.   Imaging: Dg Chest Port 1 View  Result Date: 10/04/2017 CLINICAL DATA:  Bilateral chest tubes for pneumothoraces. EXAM: PORTABLE CHEST 1 VIEW COMPARISON:  10/03/2017, 10/02/2017 and 09/30/2017 FINDINGS: Bilateral chest tubes appear essentially unchanged. No visible right pneumothorax. Tiny residual left apical pneumothorax extending along the medial aspect of the mediastinum. Extensive subcutaneous emphysema in the chest and neck, slightly diminished on  the left. Increased atelectasis and probable small effusion at the left lung base. No infiltrates on the right. Minimal right base atelectasis. Heart size and pulmonary vascularity are normal. IMPRESSION: 1. Increased atelectasis/effusion at the left lung base. 2. Small residual left pneumothorax, unchanged. 3. Slightly diminished subcutaneous emphysema. Electronically Signed   By: Lorriane Shire M.D.   On: 10/04/2017 07:25   Dg Chest Port 1 View  Result Date: 10/03/2017 CLINICAL DATA:  Follow-up chest tube EXAM: PORTABLE CHEST 1 VIEW COMPARISON:  10/02/2017 FINDINGS: Cardiac shadow is stable. Postsurgical changes are again seen. The lungs are well aerated bilaterally with bilateral thoracostomy catheters. Stable left apical pneumothorax is noted. No right pneumothorax is seen. The known left upper lobe nodule is better visualized on today's exam. Previously seen left basilar atelectasis has improved. No new focal abnormality is noted. Subcutaneous emphysema is again seen. IMPRESSION: Stable left apical pneumothorax. Bilateral chest tube placement. Improved aeration in the left base. Stable left upper lobe nodule better visualized on the current exam. Electronically Signed   By: Inez Catalina M.D.   On: 10/03/2017 09:02   Dg Chest Port 1 View  Result Date: 10/02/2017 CLINICAL DATA:  Status post right chest tube placement, follow-up pneumothorax EXAM: PORTABLE CHEST 1 VIEW COMPARISON:  10/01/2017 FINDINGS: Cardiac shadow is mildly accentuated by the portable technique. Postsurgical changes are again seen. Bilateral pigtail catheters are now noted over the lung apices. The previously seen right-sided pneumothorax appears resolved. The left-sided pneumothorax is stable but not as well appreciated due to overlying subcutaneous emphysema. No bony abnormality is seen. Persistent left basilar atelectasis is noted. IMPRESSION: Resolution of previously seen right pneumothorax following chest tube placement. Stable  left apical pneumothorax. Stable left basilar atelectasis. Electronically Signed   By: Inez Catalina M.D.   On: 10/02/2017 07:23   Dg Chest Port 1 View  Result Date: 10/01/2017 CLINICAL DATA:  Follow-up chest tube EXAM: PORTABLE CHEST 1 VIEW COMPARISON:  09/30/2017 FINDINGS: Cardiac shadow is stable. Postsurgical changes are again noted. The large bore chest tube on the left is been removed in the interval. Small bore pigtail catheter remains in place. A tiny apical pneumothorax is identified on the left. This is stable from the previous exam. On the right side however, the previously seen small apical pneumothorax has enlarged significantly now with excursion of approximately 1.5 cm both apically and inferolaterally. Mild left basilar atelectasis is noted with small effusion. No acute bony abnormality is noted. IMPRESSION: Stable left apical pneumothorax following large bore chest tube removal. There is been interval increase of the right-sided pneumothorax as described above. These results will be called to the ordering clinician or representative by the Radiologist Assistant, and communication documented in the PACS or zVision Dashboard. Electronically Signed   By: Inez Catalina M.D.   On: 10/01/2017 08:40   Ir Perc Pleural Drain W/indwell Cath W/img Guide  Result Date: 10/01/2017 INDICATION:  77 year old female with a history of right-sided pneumothorax persisting. She has been referred for thoracostomy tube placement. EXAM: IMAGE GUIDED PLACEMENT OF RIGHT CHEST TUBE MEDICATIONS: None ANESTHESIA/SEDATION: Fentanyl 0.5 mcg IV; Versed 25 mg IV Moderate Sedation Time:  10 minutes The patient was continuously monitored during the procedure by the interventional radiology nurse under my direct supervision. COMPLICATIONS: None PROCEDURE: The procedure, risks, benefits, and alternatives were explained to the patient/patient's family, who provided informed consent on the patient's behalf. Specific risks that were  addressed included bleeding, infection, ongoing pneumothorax, need for further procedure/surgery, chance of hemorrhage, hemoptysis, cardiopulmonary collapse, death. Questions regarding the procedure were encouraged and answered. The patient understands and consents to the procedure. Patient was positioned in the right anterior oblique position on the IR table and scout image of the chest was performed for planning purposes. The right mid axillary line at the level of the nipple was identified, and prepped and draped in the usual sterile fashion. The skin and subcutaneous tissues were generously infiltrated 1% lidocaine for local anesthesia. A single wall needle was then used to enter the pleural space with aspiration of air. An 035 guidewire was advanced to the apex of the lung under fluoroscopy. Dilation of the skin tract was performed over the wire, and then modified Seldinger technique was used to place a 10 French pigtail catheter . Catheter was attached to water seal chamber and suction was applied confirming a operational chest tube. Retention suture was placed.  Sterile dressing was placed. Patient tolerated the procedure well and remained hemodynamically stable throughout. No complications were encountered and no significant blood loss was encounter IMPRESSION: Status post right-sided chest tube placement. Signed, Dulcy Fanny. Dellia Nims, RPVI [facility] Electronically Signed   By: Corrie Mckusick D.O.   On: 10/01/2017 13:21    Labs:  CBC: Recent Labs    09/27/17 0248 09/29/17 0226 10/01/17 0400 10/03/17 0440  WBC 14.9* 11.0* 13.7* 12.8*  HGB 9.1* 9.4* 9.9* 9.7*  HCT 27.8* 29.3* 30.2* 30.4*  PLT 170 237 393 462*    COAGS: Recent Labs    09/21/17 0902 09/24/17 1352 09/29/17 1132  INR 0.96 1.39 1.03  APTT 36 31  --     BMP: Recent Labs    09/30/17 0436 10/01/17 0400 10/02/17 0644 10/04/17 0502  NA 137 136 133* 132*  K 3.5  3.5 4.6 4.1  CL 100 98 96* 95*  CO2 29 26 29 26   GLUCOSE 104* 159* 123* 101*  BUN 5* <5* <5* 5*  CALCIUM 8.4* 8.6* 9.0 8.8*  CREATININE 0.71 0.67 0.67 0.71  GFRNONAA >60 >60 >60 >60  GFRAA >60 >60 >60 >60    LIVER FUNCTION TESTS: Recent Labs    05/19/17 0619 09/17/17 0409 09/21/17 0902  BILITOT  --  1.0 0.8  AST  --  20 23  ALT  --  16 19  ALKPHOS  --  70 77  PROT  --  6.4* 7.5  ALBUMIN 3.0* 3.5 4.1    Assessment and Plan:  Bilat PTX-- Bilat chest tube placement Lt 9/11; Rt 9/13  subcu emphysema obvious-- face mostly Rt without air leak lT small leak Plan per Dr Prescott Gum  Will follow   Electronically Signed: Monia Sabal A, PA-C 10/04/2017, 11:53 AM   I spent a total of 25 Minutes at the the patient's bedside AND on the patient's hospital floor or unit, greater than 50% of which was counseling/coordinating care for Bilat chest tube  placement

## 2017-10-04 NOTE — Progress Notes (Signed)
1000 Talked with PA earlier. Pt is not to come off suction. Will continue to hold ambulation. Will continue to follow pt. Graylon Good RN BSN 10/04/2017 10:04 AM

## 2017-10-04 NOTE — Progress Notes (Addendum)
      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 LIMA to LAD, SVG to RCA, SVG to RAMUS, SVT to OM. Endoscopic saphenous vein harvest. (N/A) TRANSESOPHAGEAL ECHOCARDIOGRAM (TEE) (N/A)   Subjective:  Patient states she is doing okay.  She thinks the swelling has improved some.  She denies issues with swallowing.  Objective: Vital signs in last 24 hours: Temp:  [98.2 F (36.8 C)-98.9 F (37.2 C)] 98.3 F (36.8 C) (09/16 0804) Pulse Rate:  [81-102] 98 (09/16 0804) Cardiac Rhythm: Normal sinus rhythm (09/16 0701) Resp:  [14-28] 14 (09/16 0804) BP: (97-119)/(44-88) 115/88 (09/16 0804) SpO2:  [90 %-100 %] 90 % (09/16 0804)  Intake/Output from previous day: 09/15 0701 - 09/16 0700 In: -  Out: 400 [Urine:400]  General appearance: alert, cooperative and no distress Heart: regular rate and rhythm Lungs: diminished breath sounds bilaterally Abdomen: soft, non-tender; bowel sounds normal; no masses,  no organomegaly Extremities: edema trace Wound: clean and dry  Lab Results: Recent Labs    10/03/17 0440  WBC 12.8*  HGB 9.7*  HCT 30.4*  PLT 462*   BMET:  Recent Labs    10/02/17 0644 10/04/17 0502  NA 133* 132*  K 4.6 4.1  CL 96* 95*  CO2 29 26  GLUCOSE 123* 101*  BUN <5* 5*  CREATININE 0.67 0.71  CALCIUM 9.0 8.8*    PT/INR: No results for input(s): LABPROT, INR in the last 72 hours. ABG    Component Value Date/Time   PHART 7.387 09/25/2017 0350   HCO3 27.4 09/25/2017 0350   TCO2 27 09/25/2017 1724   ACIDBASEDEF 1.0 06/21/2015 1259   O2SAT 98.1 09/25/2017 0350   CBG (last 3)  Recent Labs    10/03/17 0615 10/03/17 1141 10/03/17 1720  GLUCAP 97 119* 103*    Assessment/Plan: S/P Procedure(s) (LRB): CORONARY ARTERY BYPASS GRAFTING (CABG) x4 using mammary artery and saphenous vein. LIMA to LAD,  SVG to RCA, SVG to RAMUS, SVT to OM. Endoscopic saphenous vein harvest. (N/A) TRANSESOPHAGEAL ECHOCARDIOGRAM (TEE) (N/A)  1. CV- NSR, BP controlled- continue Lopressor 2. Pulm- severe bilateral sub q emphysema L > R, right chest tube has no air leak, may be able to transition to water seal today, L chest tube with continued air leak, worsening atelectasis/pleural fluid on left, leave on suction today 3. Renal- creatinine WNL, weight is below baseline, no LE edema, not on Lasix 4. ID- afebrile, on Fortaz for bronchopleural fistula 5. CBGs- controlled, patient is not a diabetic, will d/c SSIP 6. Dispo- patient continues to have extensive sub q emphysema with pneumothorax on left, now with increasing atelectasis/effusion on left. Left sided chest tube with air leak will leave on suction today, right sided chest tube is free from air leak may be able to transition to water seal soon, continue current care   LOS: 10 days    Erin Barrett 10/04/2017  Severe emphysema, active smoker with spontaneous bilateral pneumothorax and sub Q air from positive pressure ventillation/ barotrauma  Leave tubes to suction until su Q air improved patient examined and medical record reviewed,agree with above note. [name] 10/04/2017

## 2017-10-05 ENCOUNTER — Inpatient Hospital Stay (HOSPITAL_COMMUNITY): Payer: Medicare Other

## 2017-10-05 ENCOUNTER — Ambulatory Visit: Payer: Self-pay

## 2017-10-05 LAB — GLUCOSE, CAPILLARY: Glucose-Capillary: 96 mg/dL (ref 70–99)

## 2017-10-05 NOTE — Progress Notes (Addendum)
[address]       [place] [address]             [phone]      11 Days Post-Op Procedure(s) (LRB): CORONARY ARTERY BYPASS GRAFTING (CABG) x4 using mammary artery and saphenous vein. LIMA to LAD, SVG to RCA, SVG to RAMUS, SVT to OM. Endoscopic saphenous vein harvest. (N/A) TRANSESOPHAGEAL ECHOCARDIOGRAM (TEE) (N/A) Subjective: Sub q air in face slowly improving  Objective: Vital signs in last 24 hours: Temp:  [97.6 F (36.4 C)-98.8 F (37.1 C)] 97.6 F (36.4 C) (09/17 0549) Pulse Rate:  [76-98] 93 (09/17 0549) Cardiac Rhythm: Normal sinus rhythm (09/16 2140) Resp:  [11-16] 16 (09/17 0549) BP: (97-118)/(48-90) 108/90 (09/17 0549) SpO2:  [90 %-97 %] 93 % (09/17 0549) Weight:  [55.8 kg] 55.8 kg (09/17 0549)  Hemodynamic parameters for last 24 hours:    Intake/Output from previous day: 09/16 0701 - 09/17 0700 In: 903.4 [I.V.:3; IV Piggyback:900.4] Out: 1028 [Urine:1000; Chest Tube:28] Intake/Output this shift: No intake/output data recorded.  General appearance: alert, cooperative and no distress Heart: regular rate and rhythm Lungs: fairly good air exchange throughout, mildly dim in bases Abdomen: soft, non-tender Extremities: no edema Wound: incis healing well  Lab Results: Recent Labs    10/03/17 0440  WBC 12.8*  HGB 9.7*  HCT 30.4*  PLT 462*   BMET:  Recent Labs    10/04/17 0502  NA 132*  K 4.1  CL 95*  CO2 26  GLUCOSE 101*  BUN 5*  CREATININE 0.71  CALCIUM 8.8*    PT/INR: No results for input(s): LABPROT, INR in the last 72 hours. ABG    Component Value Date/Time   PHART 7.387 09/25/2017 0350   HCO3 27.4 09/25/2017 0350   TCO2 27 09/25/2017 1724   ACIDBASEDEF 1.0 06/21/2015 1259   O2SAT 98.1 09/25/2017 0350   CBG (last 3)  Recent Labs    10/04/17 1636 10/04/17 2114 10/05/17 0607  GLUCAP 158* 102* 96    Meds Scheduled Meds: . aspirin EC  325 mg Oral Daily   Or  . aspirin  324 mg Per Tube Daily  .  atorvastatin  20 mg Oral QHS  . bisacodyl  10 mg Oral Daily   Or  . bisacodyl  10 mg Rectal Daily  . chlorhexidine gluconate (MEDLINE KIT)  15 mL Mouth Rinse BID  . Chlorhexidine Gluconate Cloth  6 each Topical Daily  . Chlorhexidine Gluconate Cloth  6 each Topical Q0600  . docusate sodium  200 mg Oral Daily  . enoxaparin (LOVENOX) injection  40 mg Subcutaneous QHS  . feeding supplement (ENSURE ENLIVE)  237 mL Oral TID WC  . mouth rinse  15 mL Mouth Rinse BID  . memantine  10 mg Oral BID  . metoprolol tartrate  12.5 mg Oral BID   Or  . metoprolol tartrate  12.5 mg Per Tube BID  . pantoprazole  40 mg Oral Daily  . sodium chloride flush  10-40 mL Intracatheter Q12H  . sodium chloride flush  3 mL Intravenous Q12H  . sodium chloride flush  5 mL Intracatheter Q8H  . venlafaxine XR  37.5 mg Oral QHS   Continuous Infusions: . sodium chloride 200 mL (10/01/17 1336)  . cefTAZidime (FORTAZ)  IV 1 g (10/05/17 0624)   PRN Meds:.sodium chloride, ALPRAZolam, lidocaine (PF), magnesium hydroxide, metoprolol tartrate, ondansetron (ZOFRAN) IV, oxyCODONE, sodium chloride flush, sodium chloride flush, traMADol, zolpidem  Xrays Dg Chest [facility]  1 View  Result Date: 10/04/2017 CLINICAL DATA:  Bilateral chest tubes for pneumothoraces. EXAM: PORTABLE CHEST 1 VIEW COMPARISON:  10/03/2017, 10/02/2017 and 09/30/2017 FINDINGS: Bilateral chest tubes appear essentially unchanged. No visible right pneumothorax. Tiny residual left apical pneumothorax extending along the medial aspect of the mediastinum. Extensive subcutaneous emphysema in the chest and neck, slightly diminished on the left. Increased atelectasis and probable small effusion at the left lung base. No infiltrates on the right. Minimal right base atelectasis. Heart size and pulmonary vascularity are normal. IMPRESSION: 1. Increased atelectasis/effusion at the left lung base. 2. Small residual left pneumothorax, unchanged. 3. Slightly diminished subcutaneous  emphysema. Electronically Signed   By: Lorriane Shire M.D.   On: 10/04/2017 07:25   Dg Chest Port 1 View  Result Date: 10/03/2017 CLINICAL DATA:  Follow-up chest tube EXAM: PORTABLE CHEST 1 VIEW COMPARISON:  10/02/2017 FINDINGS: Cardiac shadow is stable. Postsurgical changes are again seen. The lungs are well aerated bilaterally with bilateral thoracostomy catheters. Stable left apical pneumothorax is noted. No right pneumothorax is seen. The known left upper lobe nodule is better visualized on today's exam. Previously seen left basilar atelectasis has improved. No new focal abnormality is noted. Subcutaneous emphysema is again seen. IMPRESSION: Stable left apical pneumothorax. Bilateral chest tube placement. Improved aeration in the left base. Stable left upper lobe nodule better visualized on the current exam. Electronically Signed   By: Inez Catalina M.D.   On: 10/03/2017 09:02    Assessment/Plan: S/P Procedure(s) (LRB): CORONARY ARTERY BYPASS GRAFTING (CABG) x4 using mammary artery and saphenous vein. LIMA to LAD, SVG to RCA, SVG to RAMUS, SVT to OM. Endoscopic saphenous vein harvest. (N/A) TRANSESOPHAGEAL ECHOCARDIOGRAM (TEE) (N/A)  1 stable hemodynamics in sinus rhythm with occ PVC's 2 small air leak on left, none on right, CXR is stable in appearance- poss H2O seal right tube soon 3 day 7 fortaz, poss d/c. No fevers 4 no new labs 5 cont O2 6 BS adeq control  LOS: 11 days    Rhonda Hughes 10/05/2017 Pager [phone]  patient examined and medical record reviewed,agree with above note. [name] 10/05/2017

## 2017-10-05 NOTE — Progress Notes (Signed)
Referring Physician(s): Drr P VanTrigt  Supervising Physician: Corrie Mckusick  Patient Status:  [name] - In-pt  Chief Complaint:  B PTX post CABG  Subjective:  Left CT placed 9/11 in IR Rt CT placed 9/13 in IR  Much reduced facial subcu emphysema today Up in bed  In good spirits  Allergies: Adhesive [tape] and Sulfa antibiotics  Medications: Prior to Admission medications   Medication Sig Start Date End Date Taking? Authorizing Provider  ALPRAZolam (XANAX) 0.25 MG tablet Take 0.25 mg by mouth 2 (two) times daily as needed for anxiety.     [provider]  aspirin EC 81 MG EC tablet Take 1 tablet (81 mg total) by mouth daily. 09/18/17   Barrett, Evelene Croon, PA-C  atorvastatin (LIPITOR) 20 MG tablet Take 1 tablet (20 mg total) by mouth at bedtime. 09/17/17   Barrett, Evelene Croon, PA-C  clopidogrel (PLAVIX) 75 MG tablet Take 75 mg by mouth daily.    [provider]  diphenhydramine-acetaminophen (TYLENOL PM) 25-500 MG TABS Take 2 tablets by mouth at bedtime as needed (for sleep).     [provider]  docusate sodium (COLACE) 100 MG capsule Take 200 mg by mouth daily as needed for mild constipation.     [provider]  isosorbide mononitrate (IMDUR) 30 MG 24 hr tablet TAKE 1 TABLET BY MOUTH  DAILY Patient taking differently: Take 30 mg by mouth at bedtime.  01/04/17   [name], MD  lisinopril-hydrochlorothiazide (PRINZIDE,ZESTORETIC) 20-12.5 MG tablet Take 1 tablet by mouth daily. Hold medication until follow-up with PCP Patient taking differently: Take 1 tablet by mouth at bedtime.  05/19/17   Barton Dubois, MD  magnesium oxide (MAG-OX) 400 (241.3 Mg) MG tablet Take 0.5 tablets (200 mg total) by mouth daily. 09/18/17   Barrett, Evelene Croon, PA-C  memantine (NAMENDA) 10 MG tablet Take 10 mg by mouth 2 (two) times daily.  05/04/17   [provider]  nitroGLYCERIN (NITROSTAT) 0.4 MG SL tablet Place 1 tablet (0.4 mg total) under the  tongue every 5 (five) minutes as needed for chest pain. 09/17/17   Barrett, Evelene Croon, PA-C  pantoprazole (PROTONIX) 20 MG tablet Take 20 mg by mouth daily.    [provider]  Polyvinyl Alcohol-Povidone PF (REFRESH) 1.4-0.6 % SOLN Place 1-2 drops into both eyes 2 (two) times daily as needed (for eye irritation).     [provider]  potassium chloride SA (K-DUR,KLOR-CON) 20 MEQ tablet Take 1 tablet (20 mEq total) by mouth daily. 05/19/17   Barton Dubois, MD  venlafaxine XR (EFFEXOR-XR) 37.5 MG 24 hr capsule Take 37.5 mg by mouth at bedtime.    [provider]  zolpidem (AMBIEN) 10 MG tablet Take 10 mg by mouth at bedtime.    [provider]  niacin (NIASPAN) 500 MG CR tablet Take 500 mg by mouth at bedtime.    04/01/11  [provider]     Vital Signs: BP (!) 120/57 (BP Location: Right Arm)   Pulse 84   Temp 98.3 F (36.8 C) (Oral)   Resp 13   Ht 5\' 2"  (1.575 m)   Wt 123 lb 0.3 oz (55.8 kg)   SpO2 94%   BMI 22.50 kg/m   Physical Exam  Constitutional: She is oriented to person, place, and time.  Pulmonary/Chest: Effort normal.  Neurological: She is alert and oriented to person, place, and time.  Skin: Skin is warm and dry.  Sites are clean and dry  Left CT with small airleak Right CT no airleak  Vitals reviewed.   Imaging: Dg Chest Port 1 View  Result Date: 10/05/2017 CLINICAL DATA:  History of bilateral chest tubes, prior CABG EXAM: PORTABLE CHEST 1 VIEW COMPARISON:  Chest x-ray of 10/04/2017 and CT chest of 09/17/2016 FINDINGS: There is little change in volume of the left pneumothorax, and bilateral chest tubes remain. The right pneumothorax is not definitely visualized on the current film. There is opacity at both lung bases most consistent with atelectasis left-greater-than-right and pneumonia cannot be excluded particularly the left lung base. Bilateral chest wall subcutaneous emphysema remains. Heart size is stable. Median sternotomy  sutures are noted. IMPRESSION: 1. No change in small left pneumothorax with bilateral chest remaining. No definite right pneumothorax is currently seen. 2. Bibasilar opacities left-greater-than-right. Atelectasis most likely, but cannot exclude pneumonia particularly at the left lung base. 3. Considerable bilateral chest wall subcutaneous emphysema. Electronically Signed   By: Ivar Drape M.D.   On: 10/05/2017 09:23   Dg Chest Port 1 View  Result Date: 10/04/2017 CLINICAL DATA:  Bilateral chest tubes for pneumothoraces. EXAM: PORTABLE CHEST 1 VIEW COMPARISON:  10/03/2017, 10/02/2017 and 09/30/2017 FINDINGS: Bilateral chest tubes appear essentially unchanged. No visible right pneumothorax. Tiny residual left apical pneumothorax extending along the medial aspect of the mediastinum. Extensive subcutaneous emphysema in the chest and neck, slightly diminished on the left. Increased atelectasis and probable small effusion at the left lung base. No infiltrates on the right. Minimal right base atelectasis. Heart size and pulmonary vascularity are normal. IMPRESSION: 1. Increased atelectasis/effusion at the left lung base. 2. Small residual left pneumothorax, unchanged. 3. Slightly diminished subcutaneous emphysema. Electronically Signed   By: Lorriane Shire M.D.   On: 10/04/2017 07:25   Dg Chest Port 1 View  Result Date: 10/03/2017 CLINICAL DATA:  Follow-up chest tube EXAM: PORTABLE CHEST 1 VIEW COMPARISON:  10/02/2017 FINDINGS: Cardiac shadow is stable. Postsurgical changes are again seen. The lungs are well aerated bilaterally with bilateral thoracostomy catheters. Stable left apical pneumothorax is noted. No right pneumothorax is seen. The known left upper lobe nodule is better visualized on today's exam. Previously seen left basilar atelectasis has improved. No new focal abnormality is noted. Subcutaneous emphysema is again seen. IMPRESSION: Stable left apical pneumothorax. Bilateral chest tube placement.  Improved aeration in the left base. Stable left upper lobe nodule better visualized on the current exam. Electronically Signed   By: Inez Catalina M.D.   On: 10/03/2017 09:02   Dg Chest Port 1 View  Result Date: 10/02/2017 CLINICAL DATA:  Status post right chest tube placement, follow-up pneumothorax EXAM: PORTABLE CHEST 1 VIEW COMPARISON:  10/01/2017 FINDINGS: Cardiac shadow is mildly accentuated by the portable technique. Postsurgical changes are again seen. Bilateral pigtail catheters are now noted over the lung apices. The previously seen right-sided pneumothorax appears resolved. The left-sided pneumothorax is stable but not as well appreciated due to overlying subcutaneous emphysema. No bony abnormality is seen. Persistent left basilar atelectasis is noted. IMPRESSION: Resolution of previously seen right pneumothorax following chest tube placement. Stable left apical pneumothorax. Stable left basilar atelectasis. Electronically Signed   By: Inez Catalina M.D.   On: 10/02/2017 07:23   Ir Perc Pleural Drain W/indwell Cath W/img Guide  Result Date: 10/01/2017 INDICATION: 76 year old female with a history of right-sided pneumothorax persisting. She has been referred for thoracostomy tube placement. EXAM: IMAGE GUIDED PLACEMENT OF RIGHT CHEST TUBE MEDICATIONS: None ANESTHESIA/SEDATION: Fentanyl 0.5 mcg IV; Versed 25 mg IV Moderate  Sedation Time:  10 minutes The patient was continuously monitored during the procedure by the interventional radiology nurse under my direct supervision. COMPLICATIONS: None PROCEDURE: The procedure, risks, benefits, and alternatives were explained to the patient/patient's family, who provided informed consent on the patient's behalf. Specific risks that were addressed included bleeding, infection, ongoing pneumothorax, need for further procedure/surgery, chance of hemorrhage, hemoptysis, cardiopulmonary collapse, death. Questions regarding the procedure were encouraged and  answered. The patient understands and consents to the procedure. Patient was positioned in the right anterior oblique position on the IR table and scout image of the chest was performed for planning purposes. The right mid axillary line at the level of the nipple was identified, and prepped and draped in the usual sterile fashion. The skin and subcutaneous tissues were generously infiltrated 1% lidocaine for local anesthesia. A single wall needle was then used to enter the pleural space with aspiration of air. An 035 guidewire was advanced to the apex of the lung under fluoroscopy. Dilation of the skin tract was performed over the wire, and then modified Seldinger technique was used to place a 10 French pigtail catheter . Catheter was attached to water seal chamber and suction was applied confirming a operational chest tube. Retention suture was placed.  Sterile dressing was placed. Patient tolerated the procedure well and remained hemodynamically stable throughout. No complications were encountered and no significant blood loss was encounter IMPRESSION: Status post right-sided chest tube placement. Signed, Dulcy Fanny. Dellia Nims, RPVI [facility] Radiology Electronically Signed   By: Corrie Mckusick D.O.   On: 10/01/2017 13:21    Labs:  CBC: Recent Labs    09/27/17 0248 09/29/17 0226 10/01/17 0400 10/03/17 0440  WBC 14.9* 11.0* 13.7* 12.8*  HGB 9.1* 9.4* 9.9* 9.7*  HCT 27.8* 29.3* 30.2* 30.4*  PLT 170 237 393 462*    COAGS: Recent Labs    09/21/17 0902 09/24/17 1352 09/29/17 1132  INR 0.96 1.39 1.03  APTT 36 31  --     BMP: Recent Labs    09/30/17 0436 10/01/17 0400 10/02/17 0644 10/04/17 0502  NA 137 136 133* 132*  K 3.5 3.5 4.6 4.1  CL 100 98 96* 95*  CO2 29 26 29 26   GLUCOSE 104* 159* 123* 101*  BUN 5* <5* <5* 5*  CALCIUM 8.4* 8.6* 9.0 8.8*  CREATININE 0.71 0.67 0.67 0.71  GFRNONAA >60 >60 >60 >60  GFRAA >60 >60 >60 >60     LIVER FUNCTION TESTS: Recent Labs    05/19/17 0619 09/17/17 0409 09/21/17 0902  BILITOT  --  1.0 0.8  AST  --  20 23  ALT  --  16 19  ALKPHOS  --  70 77  PROT  --  6.4* 7.5  ALBUMIN 3.0* 3.5 4.1    Assessment and Plan:  Bilat PTX subcu emphysema Left and right chest tubes intact Will follow Plan per Dr Darcey Nora  Electronically Signed: Monia Sabal A, PA-C 10/05/2017, 10:12 AM   I spent a total of 15 Minutes at the the patient's bedside AND on the patient's hospital floor or unit, greater than 50% of which was counseling/coordinating care for Bilat chest tubes    Patient ID: Rhonda Hughes, female   DOB: [birth year], 77 y.o.   MRN: [id number]

## 2017-10-06 ENCOUNTER — Inpatient Hospital Stay (HOSPITAL_COMMUNITY): Payer: Medicare Other

## 2017-10-06 LAB — TYPE AND SCREEN
ABO/RH(D): O NEG
Antibody Screen: NEGATIVE
Unit division: 0
Unit division: 0
Unit division: 0
Unit division: 0
Unit division: 0
Unit division: 0

## 2017-10-06 LAB — BPAM RBC
Blood Product Expiration Date: 201909202359
Blood Product Expiration Date: 201909232359
Blood Product Expiration Date: 201909272359
Blood Product Expiration Date: 201909272359
Blood Product Expiration Date: 201909292359
Blood Product Expiration Date: 201909292359
ISSUE DATE / TIME: 201909060844
ISSUE DATE / TIME: 201909060844
ISSUE DATE / TIME: 201909061037
ISSUE DATE / TIME: 201909061037
Unit Type and Rh: 9500
Unit Type and Rh: 9500
Unit Type and Rh: 9500
Unit Type and Rh: 9500
Unit Type and Rh: 9500
Unit Type and Rh: 9500

## 2017-10-06 LAB — GLUCOSE, CAPILLARY: Glucose-Capillary: 134 mg/dL — ABNORMAL HIGH (ref 70–99)

## 2017-10-06 NOTE — Progress Notes (Signed)
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 LIMA to LAD, SVG to RCA, SVG to RAMUS, SVT to OM. Endoscopic saphenous vein harvest. (N/A) TRANSESOPHAGEAL ECHOCARDIOGRAM (TEE) (N/A) Subjective: Feels better, sub q air in face is significantly improved  Objective: Vital signs in last 24 hours: Temp:  [98.3 F (36.8 C)-99.5 F (37.5 C)] 99.5 F (37.5 C) (09/18 0401) Pulse Rate:  [80-93] 87 (09/17 2350) Cardiac Rhythm: Normal sinus rhythm (09/18 0700) Resp:  [13-21] 13 (09/18 0401) BP: (110-128)/(54-82) 128/61 (09/18 0401) SpO2:  [94 %-99 %] 94 % (09/18 0401) Weight:  [54.7 kg] 54.7 kg (09/18 0401)  Hemodynamic parameters for last 24 hours:    Intake/Output from previous day: 09/17 0701 - 09/18 0700 In: 2867 [P.O.:1470; I.V.:1097; IV Piggyback:300] Out: 1806 [Urine:1802; Chest Tube:4] Intake/Output this shift: No intake/output data recorded.  General appearance: alert, cooperative and no distress Heart: regular rate and rhythm Lungs: moving air throughout Abdomen: benign Extremities: no edema Wound: incis healing well  Lab Results: No results for input(s): WBC, HGB, HCT, PLT in the last 72 hours. BMET:  Recent Labs    10/04/17 0502  NA 132*  K 4.1  CL 95*  CO2 26  GLUCOSE 101*  BUN 5*  CREATININE 0.71  CALCIUM 8.8*    PT/INR: No results for input(s): LABPROT, INR in the last 72 hours. ABG    Component Value Date/Time   PHART 7.387 09/25/2017 0350   HCO3 27.4 09/25/2017 0350   TCO2 27 09/25/2017 1724   ACIDBASEDEF 1.0 06/21/2015 1259   O2SAT 98.1 09/25/2017 0350   CBG (last 3)  Recent Labs    10/04/17 1636 10/04/17 2114 10/05/17 0607  GLUCAP 158* 102* 96    Meds Scheduled Meds: . aspirin EC  325 mg Oral Daily   Or  . aspirin  324 mg Per Tube Daily  . atorvastatin  20 mg Oral QHS    . bisacodyl  10 mg Oral Daily   Or  . bisacodyl  10 mg Rectal Daily  . chlorhexidine gluconate (MEDLINE KIT)  15 mL Mouth Rinse BID  . Chlorhexidine Gluconate Cloth  6 each Topical Daily  . Chlorhexidine Gluconate Cloth  6 each Topical Q0600  . docusate sodium  200 mg Oral Daily  . enoxaparin (LOVENOX) injection  40 mg Subcutaneous QHS  . feeding supplement (ENSURE ENLIVE)  237 mL Oral TID WC  . mouth rinse  15 mL Mouth Rinse BID  . memantine  10 mg Oral BID  . metoprolol tartrate  12.5 mg Oral BID   Or  . metoprolol tartrate  12.5 mg Per Tube BID  . pantoprazole  40 mg Oral Daily  . sodium chloride flush  10-40 mL Intracatheter Q12H  . sodium chloride flush  3 mL Intravenous Q12H  . sodium chloride flush  5 mL Intracatheter Q8H  . venlafaxine XR  37.5 mg Oral QHS   Continuous Infusions: . sodium chloride 200 mL (10/01/17 1336)   PRN Meds:.sodium chloride, ALPRAZolam, lidocaine (PF), magnesium hydroxide, metoprolol tartrate, ondansetron (ZOFRAN) IV, oxyCODONE, sodium chloride flush, sodium chloride flush, traMADol, zolpidem  Xrays Dg Chest Port 1 View  Result Date: 10/05/2017 CLINICAL DATA:  History of bilateral chest tubes, prior CABG EXAM: PORTABLE CHEST 1  VIEW COMPARISON:  Chest x-ray of 10/04/2017 and CT chest of 09/17/2016 FINDINGS: There is little change in volume of the left pneumothorax, and bilateral chest tubes remain. The right pneumothorax is not definitely visualized on the current film. There is opacity at both lung bases most consistent with atelectasis left-greater-than-right and pneumonia cannot be excluded particularly the left lung base. Bilateral chest wall subcutaneous emphysema remains. Heart size is stable. Median sternotomy sutures are noted. IMPRESSION: 1. No change in small left pneumothorax with bilateral chest remaining. No definite right pneumothorax is currently seen. 2. Bibasilar opacities left-greater-than-right. Atelectasis most likely, but cannot  exclude pneumonia particularly at the left lung base. 3. Considerable bilateral chest wall subcutaneous emphysema. Electronically Signed   By: Ivar Drape M.D.   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 LIMA to LAD, SVG to RCA, SVG to RAMUS, SVT to OM. Endoscopic saphenous vein harvest. (N/A) TRANSESOPHAGEAL ECHOCARDIOGRAM (TEE) (N/A)  1 doing well overall, sub q air decreasing, I detect no air leak currently, poss change to H2O seal soon 2 hemodyn stable in sinus with some pvc's 3 O2 sats good on 1 liter, cont for now 4 no new labs 5 cont rehab   LOS: 12 days    Rhonda Hughes 10/06/2017 Pager [phone]

## 2017-10-06 NOTE — Progress Notes (Signed)
CARDIAC REHAB PHASE I   PRE:  Rate/Rhythm: 91 SR    BP: sitting 119/53    SaO2: 93 RA  MODE:  Ambulation: 470 ft   POST:  Rate/Rhythm: 103 ST    BP: sitting 105/46     SaO2: 92-93 RA  Pt still moving well. Reminded her to keep sternal precautions getting out of bed. Used rollator to hold CTs. Fairly steady. C/o feeling jittery with being up/walking. She had not walked in almost 1 week. Rest x2 to check SaO2, maintaining 92 RA. To BR then recliner after walk, returned CTs to suction. BP lower but she denied dizziness walking, just stated she was jittery and weak. Left on RA, she would like to walk x2 more today. Encouraged IS more.  [place], ACSM 10/06/2017 10:44 AM

## 2017-10-06 NOTE — Progress Notes (Signed)
Referring Physician(s): Dr. Prescott Gum  Supervising Physician: Arne Cleveland  Patient Status:  [facility] - In-pt  Chief Complaint: Bilateral pneumothorax s/p right chest tube placement 9/11, s/p left chest tube placement 9/13  Subjective: Comfortably sitting in chair.  SQ emphysema improved.   Allergies: Adhesive [tape] and Sulfa antibiotics  Medications: Prior to Admission medications   Medication Sig Start Date End Date Taking? Authorizing Provider  ALPRAZolam (XANAX) 0.25 MG tablet Take 0.25 mg by mouth 2 (two) times daily as needed for anxiety.     [provider]  aspirin EC 81 MG EC tablet Take 1 tablet (81 mg total) by mouth daily. 09/18/17   Barrett, Evelene Croon, PA-C  atorvastatin (LIPITOR) 20 MG tablet Take 1 tablet (20 mg total) by mouth at bedtime. 09/17/17   Barrett, Evelene Croon, PA-C  clopidogrel (PLAVIX) 75 MG tablet Take 75 mg by mouth daily.    [provider]  diphenhydramine-acetaminophen (TYLENOL PM) 25-500 MG TABS Take 2 tablets by mouth at bedtime as needed (for sleep).     [provider]  docusate sodium (COLACE) 100 MG capsule Take 200 mg by mouth daily as needed for mild constipation.     [provider]  isosorbide mononitrate (IMDUR) 30 MG 24 hr tablet TAKE 1 TABLET BY MOUTH  DAILY Patient taking differently: Take 30 mg by mouth at bedtime.  01/04/17   [name], MD  lisinopril-hydrochlorothiazide (PRINZIDE,ZESTORETIC) 20-12.5 MG tablet Take 1 tablet by mouth daily. Hold medication until follow-up with PCP Patient taking differently: Take 1 tablet by mouth at bedtime.  05/19/17   Barton Dubois, MD  magnesium oxide (MAG-OX) 400 (241.3 Mg) MG tablet Take 0.5 tablets (200 mg total) by mouth daily. 09/18/17   Barrett, Evelene Croon, PA-C  memantine (NAMENDA) 10 MG tablet Take 10 mg by mouth 2 (two) times daily.  05/04/17   [provider]  nitroGLYCERIN (NITROSTAT) 0.4 MG SL tablet Place 1 tablet (0.4 mg total) under  the tongue every 5 (five) minutes as needed for chest pain. 09/17/17   Barrett, Evelene Croon, PA-C  pantoprazole (PROTONIX) 20 MG tablet Take 20 mg by mouth daily.    [provider]  Polyvinyl Alcohol-Povidone PF (REFRESH) 1.4-0.6 % SOLN Place 1-2 drops into both eyes 2 (two) times daily as needed (for eye irritation).     [provider]  potassium chloride SA (K-DUR,KLOR-CON) 20 MEQ tablet Take 1 tablet (20 mEq total) by mouth daily. 05/19/17   Barton Dubois, MD  venlafaxine XR (EFFEXOR-XR) 37.5 MG 24 hr capsule Take 37.5 mg by mouth at bedtime.    [provider]  zolpidem (AMBIEN) 10 MG tablet Take 10 mg by mouth at bedtime.    [provider]  niacin (NIASPAN) 500 MG CR tablet Take 500 mg by mouth at bedtime.    04/01/11  [provider]     Vital Signs: BP (!) 101/43 (BP Location: Right Arm)   Pulse 83   Temp 97.9 F (36.6 C) (Oral)   Resp 16   Ht 5\' 2"  (1.575 m)   Wt 120 lb 8 oz (54.7 kg) Comment: Scale A  SpO2 98%   BMI 22.04 kg/m   Physical Exam  NAD, alert Chest: R chest tube in place.  To suction currently. Small amount of serous output.  L chest tube in place. Small airleak previously reported, none visible today.   Imaging: Dg Chest Port 1 View  Result Date: 10/06/2017 CLINICAL DATA:  Status post CABG. Chest tubes present. EXAM: PORTABLE CHEST 1 VIEW COMPARISON:  10/05/2017 FINDINGS: Sequelae of CABG are again identified. Bilateral chest tubes remain in place with persistent extensive subcutaneous emphysema throughout the chest and visualized neck. The cardiomediastinal silhouette is unchanged. A small left apical pneumothorax is unchanged. No definite right pneumothorax is identified. Left greater than right basilar lung opacities are similar to the prior study although the left lung base was incompletely imaged. No sizable pleural effusion is identified. IMPRESSION: 1. Unchanged small left apical pneumothorax. No definite right  pneumothorax. 2. Unchanged left greater than right basilar lung opacities, likely atelectasis. 3. Persistent extensive subcutaneous emphysema. Electronically Signed   By: Logan Bores M.D.   On: 10/06/2017 10:38   Dg Chest Port 1 View  Result Date: 10/05/2017 CLINICAL DATA:  History of bilateral chest tubes, prior CABG EXAM: PORTABLE CHEST 1 VIEW COMPARISON:  Chest x-ray of 10/04/2017 and CT chest of 09/17/2016 FINDINGS: There is little change in volume of the left pneumothorax, and bilateral chest tubes remain. The right pneumothorax is not definitely visualized on the current film. There is opacity at both lung bases most consistent with atelectasis left-greater-than-right and pneumonia cannot be excluded particularly the left lung base. Bilateral chest wall subcutaneous emphysema remains. Heart size is stable. Median sternotomy sutures are noted. IMPRESSION: 1. No change in small left pneumothorax with bilateral chest remaining. No definite right pneumothorax is currently seen. 2. Bibasilar opacities left-greater-than-right. Atelectasis most likely, but cannot exclude pneumonia particularly at the left lung base. 3. Considerable bilateral chest wall subcutaneous emphysema. Electronically Signed   By: Ivar Drape M.D.   On: 10/05/2017 09:23   Dg Chest Port 1 View  Result Date: 10/04/2017 CLINICAL DATA:  Bilateral chest tubes for pneumothoraces. EXAM: PORTABLE CHEST 1 VIEW COMPARISON:  10/03/2017, 10/02/2017 and 09/30/2017 FINDINGS: Bilateral chest tubes appear essentially unchanged. No visible right pneumothorax. Tiny residual left apical pneumothorax extending along the medial aspect of the mediastinum. Extensive subcutaneous emphysema in the chest and neck, slightly diminished on the left. Increased atelectasis and probable small effusion at the left lung base. No infiltrates on the right. Minimal right base atelectasis. Heart size and pulmonary vascularity are normal. IMPRESSION: 1. Increased  atelectasis/effusion at the left lung base. 2. Small residual left pneumothorax, unchanged. 3. Slightly diminished subcutaneous emphysema. Electronically Signed   By: Lorriane Shire M.D.   On: 10/04/2017 07:25   Dg Chest Port 1 View  Result Date: 10/03/2017 CLINICAL DATA:  Follow-up chest tube EXAM: PORTABLE CHEST 1 VIEW COMPARISON:  10/02/2017 FINDINGS: Cardiac shadow is stable. Postsurgical changes are again seen. The lungs are well aerated bilaterally with bilateral thoracostomy catheters. Stable left apical pneumothorax is noted. No right pneumothorax is seen. The known left upper lobe nodule is better visualized on today's exam. Previously seen left basilar atelectasis has improved. No new focal abnormality is noted. Subcutaneous emphysema is again seen. IMPRESSION: Stable left apical pneumothorax. Bilateral chest tube placement. Improved aeration in the left base. Stable left upper lobe nodule better visualized on the current exam. Electronically Signed   By: Inez Catalina M.D.   On: 10/03/2017 09:02    Labs:  CBC: Recent Labs    09/27/17 0248 09/29/17 0226 10/01/17 0400 10/03/17 0440  WBC 14.9* 11.0* 13.7* 12.8*  HGB 9.1* 9.4* 9.9* 9.7*  HCT 27.8* 29.3* 30.2* 30.4*  PLT 170 237 393 462*    COAGS: Recent Labs    09/21/17 0902 09/24/17 1352 09/29/17 1132  INR 0.96 1.39  1.03  APTT 36 31  --     BMP: Recent Labs    09/30/17 0436 10/01/17 0400 10/02/17 0644 10/04/17 0502  NA 137 136 133* 132*  K 3.5 3.5 4.6 4.1  CL 100 98 96* 95*  CO2 29 26 29 26   GLUCOSE 104* 159* 123* 101*  BUN 5* <5* <5* 5*  CALCIUM 8.4* 8.6* 9.0 8.8*  CREATININE 0.71 0.67 0.67 0.71  GFRNONAA >60 >60 >60 >60  GFRAA >60 >60 >60 >60    LIVER FUNCTION TESTS: Recent Labs    05/19/17 0619 09/17/17 0409 09/21/17 0902  BILITOT  --  1.0 0.8  AST  --  20 23  ALT  --  16 19  ALKPHOS  --  70 77  PROT  --  6.4* 7.5  ALBUMIN 3.0* 3.5 4.1    Assessment and Plan: Bilateral pneumothorax s/p right  chest tube placement 9/11, left chest tube placement 9/13 Placed right to seal and removed in its entirety without complication per order from TCTS. Left chest tube remains in place at this time.  Post-removal CXR order placed.  IR to follow.   Electronically Signed: Docia Barrier, PA 10/06/2017, 2:05 PM   I spent a total of 25 Minutes at the the patient's bedside AND on the patient's hospital floor or unit, greater than 50% of which was counseling/coordinating care for pneumothorax.

## 2017-10-06 NOTE — Care Management Note (Signed)
Case Management Note Marvetta Gibbons RN, BSN Unit 4E- RN Care Coordinator  [phone]  Patient Details  Name: Rhonda Hughes MRN: [id number] Date of Birth: [birth year]  Subjective/Objective:   Pt admitted s/p  CABGx4, post op coarse complicated by  Development of SQ air which required re-insertion of chest tubes per IR on 9/11 and 9/13                Action/Plan: PTA pt lived at home with spouse was independent with mobility and ADLs-  anticipate return home when medically stable for discharge- CM will follow for transition of care needs.   Expected Discharge Date:                  Expected Discharge Plan:  Home/Self Care  In-House Referral:     Discharge planning Services  CM Consult  Post Acute Care Choice:    Choice offered to:     DME Arranged:    DME Agency:     HH Arranged:    HH Agency:     Status of Service:  In process, will continue to follow  If discussed at Long Length of Stay Meetings, dates discussed:    Discharge Disposition:   Additional Comments:  Dawayne Patricia, RN 10/06/2017, 1:54 PM

## 2017-10-06 NOTE — Progress Notes (Signed)
Attempted to call IR.

## 2017-10-07 ENCOUNTER — Inpatient Hospital Stay (HOSPITAL_COMMUNITY): Payer: Medicare Other

## 2017-10-07 NOTE — Progress Notes (Signed)
CARDIAC REHAB PHASE I   PRE:  Rate/Rhythm: 83 SR  BP:  Supine:   Sitting: 120/49  Standing:    SaO2: 95%RA  MODE:  Ambulation: 600 ft   POST:  Rate/Rhythm: 98 SR  BP:  Supine:   Sitting: 120/70  Standing:    SaO2: 93%RA 1055-1122 Pt walked 600 ft on RA with rollator to carry chest tube. Pt tolerated well. To recliner after walk.    Graylon Good, RN BSN  10/07/2017 11:20 AM

## 2017-10-07 NOTE — Progress Notes (Signed)
Rhonda Hughes       [place] [address]             [phone]      13 Days Post-Op Procedure(s) (LRB): CORONARY ARTERY BYPASS GRAFTING (CABG) x4 using mammary artery and saphenous vein. LIMA to LAD, SVG to RCA, SVG to RAMUS, SVT to OM. Endoscopic saphenous vein harvest. (N/A) TRANSESOPHAGEAL ECHOCARDIOGRAM (TEE) (N/A) Subjective: Feels well, sub q air in face conts to improve No air leak from tube  Objective: Vital signs in last 24 hours: Temp:  [97.9 F (36.6 C)-98.5 F (36.9 C)] 98.1 F (36.7 C) (09/19 0530) Pulse Rate:  [80-91] 82 (09/19 0530) Cardiac Rhythm: Normal sinus rhythm (09/19 0700) Resp:  [14-25] 15 (09/19 0530) BP: (100-130)/(35-63) 130/63 (09/19 0530) SpO2:  [89 %-98 %] 97 % (09/19 0530) Weight:  [53.7 kg] 53.7 kg (09/19 0530)  Hemodynamic parameters for last 24 hours:    Intake/Output from previous day: 09/18 0701 - 09/19 0700 In: 240 [P.O.:240] Out: 458 [Urine:450; Chest Tube:8] Intake/Output this shift: No intake/output data recorded.  General appearance: alert, cooperative and no distress Heart: regular rate and rhythm Lungs: clear to auscultation bilaterally Abdomen: benign Extremities: no edema Wound: incis healing well  Lab Results: No results for input(s): WBC, HGB, HCT, PLT in the last 72 hours. BMET: No results for input(s): NA, K, CL, CO2, GLUCOSE, BUN, CREATININE, CALCIUM in the last 72 hours.  PT/INR: No results for input(s): LABPROT, INR in the last 72 hours. ABG    Component Value Date/Time   PHART 7.387 09/25/2017 0350   HCO3 27.4 09/25/2017 0350   TCO2 27 09/25/2017 1724   ACIDBASEDEF 1.0 06/21/2015 1259   O2SAT 98.1 09/25/2017 0350   CBG (last 3)  Recent Labs    10/04/17 2114 10/05/17 0607 10/06/17 1128  GLUCAP 102* 96 134*    Meds Scheduled Meds: . aspirin EC  325 mg Oral Daily   Or  . aspirin  324 mg Per Tube Daily  . atorvastatin  20 mg Oral QHS  . bisacodyl  10 mg Oral Daily   Or  .  bisacodyl  10 mg Rectal Daily  . chlorhexidine gluconate (MEDLINE KIT)  15 mL Mouth Rinse BID  . Chlorhexidine Gluconate Cloth  6 each Topical Daily  . Chlorhexidine Gluconate Cloth  6 each Topical Q0600  . docusate sodium  200 mg Oral Daily  . enoxaparin (LOVENOX) injection  40 mg Subcutaneous QHS  . feeding supplement (ENSURE ENLIVE)  237 mL Oral TID WC  . mouth rinse  15 mL Mouth Rinse BID  . memantine  10 mg Oral BID  . metoprolol tartrate  12.5 mg Oral BID   Or  . metoprolol tartrate  12.5 mg Per Tube BID  . pantoprazole  40 mg Oral Daily  . sodium chloride flush  3 mL Intravenous Q12H  . sodium chloride flush  5 mL Intracatheter Q8H  . venlafaxine XR  37.5 mg Oral QHS   Continuous Infusions: . sodium chloride 200 mL (10/01/17 1336)   PRN Meds:.sodium chloride, ALPRAZolam, lidocaine (PF), magnesium hydroxide, metoprolol tartrate, ondansetron (ZOFRAN) IV, oxyCODONE, sodium chloride flush, traMADol, zolpidem  Xrays Dg Chest Port 1 View  Result Date: 10/06/2017 CLINICAL DATA:  Status post removal of right chest tube. EXAM: PORTABLE CHEST 1 VIEW COMPARISON:  10/06/2017, 0639 hours FINDINGS: Stable heart size and mediastinal contours. Right-sided pigtail thoracostomy tube is been removed. No visible residual pneumothorax. Stable subcutaneous emphysema throughout  the chest wall. Left-sided chest tube remains in place with probable component of stable apical pneumothorax roughly 5-10% volume. Stable pneumomediastinum. IMPRESSION: No pneumothorax after right chest tube removal. Stable left apical pneumothorax of 5-10% volume. Electronically Signed   By: Aletta Edouard M.D.   On: 10/06/2017 15:22   Dg Chest Port 1 View  Result Date: 10/06/2017 CLINICAL DATA:  Status post CABG. Chest tubes present. EXAM: PORTABLE CHEST 1 VIEW COMPARISON:  10/05/2017 FINDINGS: Sequelae of CABG are again identified. Bilateral chest tubes remain in place with persistent extensive subcutaneous emphysema  throughout the chest and visualized neck. The cardiomediastinal silhouette is unchanged. A small left apical pneumothorax is unchanged. No definite right pneumothorax is identified. Left greater than right basilar lung opacities are similar to the prior study although the left lung base was incompletely imaged. No sizable pleural effusion is identified. IMPRESSION: 1. Unchanged small left apical pneumothorax. No definite right pneumothorax. 2. Unchanged left greater than right basilar lung opacities, likely atelectasis. 3. Persistent extensive subcutaneous emphysema. Electronically Signed   By: Logan Bores M.D.   On: 10/06/2017 10:38    Assessment/Plan: S/P Procedure(s) (LRB): CORONARY ARTERY BYPASS GRAFTING (CABG) x4 using mammary artery and saphenous vein. LIMA to LAD, SVG to RCA, SVG to RAMUS, SVT to OM. Endoscopic saphenous vein harvest. (N/A) TRANSESOPHAGEAL ECHOCARDIOGRAM (TEE) (N/A)  1 conts to improve, place CT to H2O seal and check CXR in couple hours, hopefully will be able to remove tube in am and discharge 2 hemodyn stable in sinus rhythm  LOS: 13 days    Rhonda Hughes 10/07/2017 Pager [phone]

## 2017-10-08 ENCOUNTER — Inpatient Hospital Stay (HOSPITAL_COMMUNITY): Payer: Medicare Other

## 2017-10-08 NOTE — Care Management Important Message (Signed)
Important Message  Patient Details  Name: Rhonda Hughes MRN: [id number] Date of Birth: 1940/08/01   Medicare Important Message Given:  Yes    Jamilya Sarrazin P Kinga Cassar 10/08/2017, 2:55 PM

## 2017-10-08 NOTE — Plan of Care (Signed)
  Problem: Health Behavior/Discharge Planning: Goal: Ability to manage health-related needs will improve Outcome: Progressing   Problem: Clinical Measurements: Goal: Will remain free from infection Outcome: Progressing Goal: Diagnostic test results will improve Outcome: Progressing   

## 2017-10-08 NOTE — Progress Notes (Signed)
[address]       [place] [address]             [phone]      14 Days Post-Op Procedure(s) (LRB): CORONARY ARTERY BYPASS GRAFTING (CABG) x4 using mammary artery and saphenous vein. LIMA to LAD, SVG to RCA, SVG to RAMUS, SVT to OM. Endoscopic saphenous vein harvest. (N/A) TRANSESOPHAGEAL ECHOCARDIOGRAM (TEE) (N/A) Subjective: Feels pretty well, no new issues  Objective: Vital signs in last 24 hours: Temp:  [97.8 F (36.6 C)-98.4 F (36.9 C)] 98 F (36.7 C) (09/20 0538) Pulse Rate:  [78-83] 78 (09/19 1349) Cardiac Rhythm: Normal sinus rhythm (09/20 0538) Resp:  [11-28] 18 (09/20 0538) BP: (103-137)/(35-98) 119/48 (09/20 0538) SpO2:  [91 %-97 %] 93 % (09/20 0538) Weight:  [53.5 kg] 53.5 kg (09/20 0500)  Hemodynamic parameters for last 24 hours:    Intake/Output from previous day: 09/19 0701 - 09/20 0700 In: 1560 [P.O.:1560] Out: 1251 [Urine:1250; Stool:1] Intake/Output this shift: No intake/output data recorded.  General appearance: alert, cooperative and no distress Heart: regular rate and rhythm Lungs: clear to auscultation bilaterally Abdomen: benign Extremities: no edema Wound: inics healing well  Lab Results: No results for input(s): WBC, HGB, HCT, PLT in the last 72 hours. BMET: No results for input(s): NA, K, CL, CO2, GLUCOSE, BUN, CREATININE, CALCIUM in the last 72 hours.  PT/INR: No results for input(s): LABPROT, INR in the last 72 hours. ABG    Component Value Date/Time   PHART 7.387 09/25/2017 0350   HCO3 27.4 09/25/2017 0350   TCO2 27 09/25/2017 1724   ACIDBASEDEF 1.0 06/21/2015 1259   O2SAT 98.1 09/25/2017 0350   CBG (last 3)  Recent Labs    10/06/17 1128  GLUCAP 134*    Meds Scheduled Meds: . aspirin EC  325 mg Oral Daily   Or  . aspirin  324 mg Per Tube Daily  . atorvastatin  20 mg Oral QHS  . bisacodyl  10 mg Oral Daily   Or  . bisacodyl  10 mg Rectal Daily  . chlorhexidine gluconate (MEDLINE KIT)  15 mL  Mouth Rinse BID  . docusate sodium  200 mg Oral Daily  . enoxaparin (LOVENOX) injection  40 mg Subcutaneous QHS  . feeding supplement (ENSURE ENLIVE)  237 mL Oral TID WC  . mouth rinse  15 mL Mouth Rinse BID  . memantine  10 mg Oral BID  . metoprolol tartrate  12.5 mg Oral BID   Or  . metoprolol tartrate  12.5 mg Per Tube BID  . pantoprazole  40 mg Oral Daily  . sodium chloride flush  3 mL Intravenous Q12H  . venlafaxine XR  37.5 mg Oral QHS   Continuous Infusions: . sodium chloride 200 mL (10/01/17 1336)   PRN Meds:.sodium chloride, ALPRAZolam, lidocaine (PF), magnesium hydroxide, metoprolol tartrate, ondansetron (ZOFRAN) IV, oxyCODONE, sodium chloride flush, traMADol, zolpidem  Xrays Dg Chest Port 1 View  Result Date: 10/07/2017 CLINICAL DATA:  Surgery follow-up. EXAM: PORTABLE CHEST 1 VIEW COMPARISON:  10/06/2017. FINDINGS: Left chest tube noted stable position. Prior CABG. Heart size normal. Bibasilar atelectasis. Diffuse bilateral neck and chest wall subcutaneous emphysema. Diffuse osteopenia degenerative change. IMPRESSION: 1. Left chest tube noted in stable position. No pneumothorax. Diffuse bilateral neck and chest wall subcutaneous emphysema. 2.  Bibasilar atelectasis. Electronically Signed   By: Marcello Moores  Register   On: 10/07/2017 12:10   Dg Chest Port 1 View  Result Date: 10/06/2017 CLINICAL DATA:  Status post removal of right chest tube. EXAM: PORTABLE CHEST 1 VIEW COMPARISON:  10/06/2017, 0639 hours FINDINGS: Stable heart size and mediastinal contours. Right-sided pigtail thoracostomy tube is been removed. No visible residual pneumothorax. Stable subcutaneous emphysema throughout the chest wall. Left-sided chest tube remains in place with probable component of stable apical pneumothorax roughly 5-10% volume. Stable pneumomediastinum. IMPRESSION: No pneumothorax after right chest tube removal. Stable left apical pneumothorax of 5-10% volume. Electronically Signed   By: Aletta Edouard M.D.   On: 10/06/2017 15:22    Assessment/Plan: S/P Procedure(s) (LRB): CORONARY ARTERY BYPASS GRAFTING (CABG) x4 using mammary artery and saphenous vein. LIMA to LAD, SVG to RCA, SVG to RAMUS, SVT to OM. Endoscopic saphenous vein harvest. (N/A) TRANSESOPHAGEAL ECHOCARDIOGRAM (TEE) (N/A)  1 doing well  2 no air leak, if CXR ok will d/c tube and if no new issues will d/c home in am   LOS: 14 days    Rhonda Hughes 10/08/2017 Pager [phone]

## 2017-10-08 NOTE — Progress Notes (Signed)
Referring Physician(s): Dr. Prescott Gum  Supervising Physician: Aletta Edouard  Patient Status:  [facility] - In-pt  Chief Complaint: Bilateral pneumothorax s/p right chest tube placement 9/11, s/p left chest tube placement 9/13  Subjective: Continues to do well s/p right chest tube removal.  Ready for left-sided removal today.   Allergies: Adhesive [tape] and Sulfa antibiotics  Medications: Prior to Admission medications   Medication Sig Start Date End Date Taking? Authorizing Provider  ALPRAZolam (XANAX) 0.25 MG tablet Take 0.25 mg by mouth 2 (two) times daily as needed for anxiety.     [provider]  aspirin EC 81 MG EC tablet Take 1 tablet (81 mg total) by mouth daily. 09/18/17   Barrett, Evelene Croon, PA-C  atorvastatin (LIPITOR) 20 MG tablet Take 1 tablet (20 mg total) by mouth at bedtime. 09/17/17   Barrett, Evelene Croon, PA-C  clopidogrel (PLAVIX) 75 MG tablet Take 75 mg by mouth daily.    [provider]  diphenhydramine-acetaminophen (TYLENOL PM) 25-500 MG TABS Take 2 tablets by mouth at bedtime as needed (for sleep).     [provider]  docusate sodium (COLACE) 100 MG capsule Take 200 mg by mouth daily as needed for mild constipation.     [provider]  isosorbide mononitrate (IMDUR) 30 MG 24 hr tablet TAKE 1 TABLET BY MOUTH  DAILY Patient taking differently: Take 30 mg by mouth at bedtime.  01/04/17   [name], MD  lisinopril-hydrochlorothiazide (PRINZIDE,ZESTORETIC) 20-12.5 MG tablet Take 1 tablet by mouth daily. Hold medication until follow-up with PCP Patient taking differently: Take 1 tablet by mouth at bedtime.  05/19/17   Barton Dubois, MD  magnesium oxide (MAG-OX) 400 (241.3 Mg) MG tablet Take 0.5 tablets (200 mg total) by mouth daily. 09/18/17   Barrett, Evelene Croon, PA-C  memantine (NAMENDA) 10 MG tablet Take 10 mg by mouth 2 (two) times daily.  05/04/17   [provider]  nitroGLYCERIN (NITROSTAT) 0.4 MG SL tablet  Place 1 tablet (0.4 mg total) under the tongue every 5 (five) minutes as needed for chest pain. 09/17/17   Barrett, Evelene Croon, PA-C  pantoprazole (PROTONIX) 20 MG tablet Take 20 mg by mouth daily.    [provider]  Polyvinyl Alcohol-Povidone PF (REFRESH) 1.4-0.6 % SOLN Place 1-2 drops into both eyes 2 (two) times daily as needed (for eye irritation).     [provider]  potassium chloride SA (K-DUR,KLOR-CON) 20 MEQ tablet Take 1 tablet (20 mEq total) by mouth daily. 05/19/17   Barton Dubois, MD  venlafaxine XR (EFFEXOR-XR) 37.5 MG 24 hr capsule Take 37.5 mg by mouth at bedtime.    [provider]  zolpidem (AMBIEN) 10 MG tablet Take 10 mg by mouth at bedtime.    [provider]  niacin (NIASPAN) 500 MG CR tablet Take 500 mg by mouth at bedtime.    04/01/11  [provider]     Vital Signs: BP (!) 113/45 (BP Location: Left Arm)   Pulse 84   Temp 97.8 F (36.6 C) (Oral)   Resp 19   Ht 5\' 2"  (1.575 m)   Wt 118 lb (53.5 kg)   SpO2 98%   BMI 21.58 kg/m   Physical Exam  NAD, alert Chest: L chest tube in place to water seal. Small amount of serous output in Pleurvac. Breathing comfortably.  No distress.   Imaging: Dg Chest 2 View  Result Date: 10/08/2017 CLINICAL DATA:  Postop from CABG.  Follow-up  left pneumothorax. EXAM: CHEST - 2 VIEW COMPARISON:  10/07/2017 FINDINGS: Left pleural pigtail catheter remains in place. No pneumothorax visualized. Extensive subcutaneous emphysema is again seen in the chest wall soft tissues, left side greater than right. Tiny left pleural effusion and left basilar atelectasis are again noted. Right lung is clear. Pulmonary emphysema again demonstrated. Heart size is within normal limits. Prior CABG again noted. IMPRESSION: Tiny left pleural effusion and left basilar atelectasis, without significant change. No pneumothorax visualized. Electronically Signed   By: Earle Gell M.D.   On: 10/08/2017 09:17   Dg Chest Port 1  View  Result Date: 10/07/2017 CLINICAL DATA:  Surgery follow-up. EXAM: PORTABLE CHEST 1 VIEW COMPARISON:  10/06/2017. FINDINGS: Left chest tube noted stable position. Prior CABG. Heart size normal. Bibasilar atelectasis. Diffuse bilateral neck and chest wall subcutaneous emphysema. Diffuse osteopenia degenerative change. IMPRESSION: 1. Left chest tube noted in stable position. No pneumothorax. Diffuse bilateral neck and chest wall subcutaneous emphysema. 2.  Bibasilar atelectasis. Electronically Signed   By: Marcello Moores  Register   On: 10/07/2017 12:10   Dg Chest Port 1 View  Result Date: 10/06/2017 CLINICAL DATA:  Status post removal of right chest tube. EXAM: PORTABLE CHEST 1 VIEW COMPARISON:  10/06/2017, 0639 hours FINDINGS: Stable heart size and mediastinal contours. Right-sided pigtail thoracostomy tube is been removed. No visible residual pneumothorax. Stable subcutaneous emphysema throughout the chest wall. Left-sided chest tube remains in place with probable component of stable apical pneumothorax roughly 5-10% volume. Stable pneumomediastinum. IMPRESSION: No pneumothorax after right chest tube removal. Stable left apical pneumothorax of 5-10% volume. Electronically Signed   By: Aletta Edouard M.D.   On: 10/06/2017 15:22   Dg Chest Port 1 View  Result Date: 10/06/2017 CLINICAL DATA:  Status post CABG. Chest tubes present. EXAM: PORTABLE CHEST 1 VIEW COMPARISON:  10/05/2017 FINDINGS: Sequelae of CABG are again identified. Bilateral chest tubes remain in place with persistent extensive subcutaneous emphysema throughout the chest and visualized neck. The cardiomediastinal silhouette is unchanged. A small left apical pneumothorax is unchanged. No definite right pneumothorax is identified. Left greater than right basilar lung opacities are similar to the prior study although the left lung base was incompletely imaged. No sizable pleural effusion is identified. IMPRESSION: 1. Unchanged small left apical  pneumothorax. No definite right pneumothorax. 2. Unchanged left greater than right basilar lung opacities, likely atelectasis. 3. Persistent extensive subcutaneous emphysema. Electronically Signed   By: Logan Bores M.D.   On: 10/06/2017 10:38   Dg Chest Port 1 View  Result Date: 10/05/2017 CLINICAL DATA:  History of bilateral chest tubes, prior CABG EXAM: PORTABLE CHEST 1 VIEW COMPARISON:  Chest x-ray of 10/04/2017 and CT chest of 09/17/2016 FINDINGS: There is little change in volume of the left pneumothorax, and bilateral chest tubes remain. The right pneumothorax is not definitely visualized on the current film. There is opacity at both lung bases most consistent with atelectasis left-greater-than-right and pneumonia cannot be excluded particularly the left lung base. Bilateral chest wall subcutaneous emphysema remains. Heart size is stable. Median sternotomy sutures are noted. IMPRESSION: 1. No change in small left pneumothorax with bilateral chest remaining. No definite right pneumothorax is currently seen. 2. Bibasilar opacities left-greater-than-right. Atelectasis most likely, but cannot exclude pneumonia particularly at the left lung base. 3. Considerable bilateral chest wall subcutaneous emphysema. Electronically Signed   By: Ivar Drape M.D.   On: 10/05/2017 09:23    Labs:  CBC: Recent Labs    09/27/17 0248 09/29/17 0226 10/01/17 0400  10/03/17 0440  WBC 14.9* 11.0* 13.7* 12.8*  HGB 9.1* 9.4* 9.9* 9.7*  HCT 27.8* 29.3* 30.2* 30.4*  PLT 170 237 393 462*    COAGS: Recent Labs    09/21/17 0902 09/24/17 1352 09/29/17 1132  INR 0.96 1.39 1.03  APTT 36 31  --     BMP: Recent Labs    09/30/17 0436 10/01/17 0400 10/02/17 0644 10/04/17 0502  NA 137 136 133* 132*  K 3.5 3.5 4.6 4.1  CL 100 98 96* 95*  CO2 29 26 29 26   GLUCOSE 104* 159* 123* 101*  BUN 5* <5* <5* 5*  CALCIUM 8.4* 8.6* 9.0 8.8*  CREATININE 0.71 0.67 0.67 0.71  GFRNONAA >60 >60 >60 >60  GFRAA >60 >60 >60  >60    LIVER FUNCTION TESTS: Recent Labs    05/19/17 0619 09/17/17 0409 09/21/17 0902  BILITOT  --  1.0 0.8  AST  --  20 23  ALT  --  16 19  ALKPHOS  --  70 77  PROT  --  6.4* 7.5  ALBUMIN 3.0* 3.5 4.1    Assessment and Plan: Bilateral pneumothorax s/p right chest tube placement 9/11, left chest tube placement 9/13 Left anterior chest tube removed in its entirety without complication per order from TCTS. Post-removal CXR order placed.  IR remains available if needed in the future.   Electronically Signed: Docia Barrier, PA 10/08/2017, 1:01 PM   I spent a total of 25 Minutes at the the patient's bedside AND on the patient's hospital floor or unit, greater than 50% of which was counseling/coordinating care for pneumothorax.

## 2017-10-08 NOTE — Progress Notes (Signed)
Pt walking independently with sister carrying her CT. Feels well. Sts she gets SOB sometimes. Ed completed with pt and sister. Good reception and I will send referral to [facility]. Encouraged her to keep walking. Gave her d/c video to watch. [id number] [place], ACSM 10:33 AM 10/08/2017

## 2017-10-09 ENCOUNTER — Inpatient Hospital Stay (HOSPITAL_COMMUNITY): Payer: Medicare Other

## 2017-10-09 MED ORDER — METOPROLOL TARTRATE 25 MG PO TABS: 12.5000 mg | ORAL_TABLET | Freq: Two times a day (BID) | ORAL | 3 refills | Status: DC

## 2017-10-09 MED ORDER — LISINOPRIL 5 MG PO TABS: 5.0000 mg | ORAL_TABLET | Freq: Every day | ORAL | 11 refills | Status: DC

## 2017-10-09 MED ORDER — ASPIRIN 325 MG PO TBEC: 325.0000 mg | DELAYED_RELEASE_TABLET | Freq: Every day | ORAL | 0 refills | Status: DC

## 2017-10-09 MED ORDER — TRAMADOL HCL 50 MG PO TABS: 50.0000 mg | ORAL_TABLET | ORAL | 0 refills | Status: DC | PRN

## 2017-10-09 NOTE — Progress Notes (Signed)
      [address]       [place] [address]             [phone]      15 Days Post-Op Procedure(s) (LRB): CORONARY ARTERY BYPASS GRAFTING (CABG) x4 using mammary artery and saphenous vein. LIMA to LAD, SVG to RCA, SVG to RAMUS, SVT to OM. Endoscopic saphenous vein harvest. (N/A) TRANSESOPHAGEAL ECHOCARDIOGRAM (TEE) (N/A)   Subjective:  No new complaints.  Feeling much better.  + ambulation  + BM  Objective: Vital signs in last 24 hours: Temp:  [97.8 F (36.6 C)-99.1 F (37.3 C)] 98.3 F (36.8 C) (09/21 0754) Pulse Rate:  [81-91] 85 (09/21 0754) Cardiac Rhythm: Normal sinus rhythm (09/21 0700) Resp:  [14-20] 18 (09/21 0754) BP: (99-146)/(44-73) 134/58 (09/21 0754) SpO2:  [95 %-98 %] 95 % (09/21 0754)  Intake/Output from previous day: 09/20 0701 - 09/21 0700 In: 1200 [P.O.:1200] Out: 1951 [Urine:1950; Stool:1]  General appearance: alert, cooperative and no distress Heart: regular rate and rhythm Lungs: clear to auscultation bilaterally Abdomen: soft, non-tender; bowel sounds normal; no masses,  no organomegaly Extremities: edema trace Wound: clean and dry  Lab Results: No results for input(s): WBC, HGB, HCT, PLT in the last 72 hours. BMET: No results for input(s): NA, K, CL, CO2, GLUCOSE, BUN, CREATININE, CALCIUM in the last 72 hours.  PT/INR: No results for input(s): LABPROT, INR in the last 72 hours. ABG    Component Value Date/Time   PHART 7.387 09/25/2017 0350   HCO3 27.4 09/25/2017 0350   TCO2 27 09/25/2017 1724   ACIDBASEDEF 1.0 06/21/2015 1259   O2SAT 98.1 09/25/2017 0350   CBG (last 3)  Recent Labs    10/06/17 1128  GLUCAP 134*    Assessment/Plan: S/P Procedure(s) (LRB): CORONARY ARTERY BYPASS GRAFTING (CABG) x4 using mammary artery and saphenous vein. LIMA to LAD, SVG to RCA, SVG to RAMUS, SVT to OM. Endoscopic saphenous vein harvest. (N/A) TRANSESOPHAGEAL ECHOCARDIOGRAM (TEE) (N/A)  1. CV-NSR, Hypertensive at times- will  continue Lopressor, will add low dose lisinopril 2. Pulm- no acute issues, persistent sub q air improving, no pneumothorax 3. Renal- creatinine WNL, weight is below baseline, no lasix indicated at this time 4. Dispo- patient stable, will d/c home today   LOS: 15 days    Ellwood Handler 10/09/2017

## 2017-10-09 NOTE — Discharge Instructions (Signed)
Discharge Instructions:  1. You may shower, please wash incisions daily with soap and water and keep dry.  If you wish to cover wounds with dressing you may do so but please keep clean and change daily.  No tub baths or swimming until incisions have completely healed.  If your incisions become red or develop any drainage please call our office at [phone]  2. No Driving until cleared by Dr. Lucianne Lei Trigt's office and you are no longer using narcotic pain medications  3. Monitor your weight daily.. Please use the same scale and weigh at same time... If you gain 5-10 lbs in 48 hours with associated lower extremity swelling, please contact our office at [phone]  4. Fever of 101.5 for at least 24 hours with no source, please contact our office at [phone]  5. Activity- up as tolerated, please walk at least 3 times per day.  Avoid strenuous activity, no lifting, pushing, or pulling with your arms over 8-10 lbs for a minimum of 6 weeks  6. If any questions or concerns arise, please do not hesitate to contact our office at [phone]     Pneumothorax A pneumothorax, commonly called a collapsed lung, is a condition in which air leaks from a lung and builds up in the space between the lung and the chest wall (pleural space). The air in a pneumothorax is trapped outside the lung and takes up space, preventing the lung from fully expanding. This is a condition that usually occurs suddenly. The buildup of air may be small or large. A small pneumothorax may go away on its own. When a pneumothorax is larger, it will often require medical treatment and hospitalization. What are the causes? A pneumothorax can sometimes happen quickly with no apparent cause. People with underlying lung problems, particularly COPD or emphysema, are at higher risk of pneumothorax. However, pneumothorax can happen quickly even in people with no prior known lung problems. Trauma, surgery, medical procedures, or injury to  the chest wall can also cause a pneumothorax. What are the signs or symptoms? Sometimes a pneumothorax will have no symptoms. When symptoms are present, they can include:  Chest pain.  Shortness of breath.  Increased rate of breathing.  Bluish color to your lips or skin (cyanosis).  How is this diagnosed? Pneumothorax is usually diagnosed by a chest X-ray or chest CT scan. Your health care provider will also take a medical history and perform a physical exam to determine why you may have a pneumothorax. How is this treated? A small pneumothorax may go away on its own without treatment. Extra oxygen can sometimes help a small pneumothorax go away more quickly. For a larger pneumothorax or a pneumothorax that is causing symptoms, a procedure is usually needed to drain the air.In some cases, the health care provider may drain the air using a needle. In other cases, a chest tube may be inserted into the pleural space. A chest tube is a small tube placed between the ribs and into the pleural space. This removes the extra air and allows the lung to expand back to its normal size. A large pneumothorax will usually require a hospital stay. If there is ongoing air leakage into the pleural space, then the chest tube may need to remain in place for several days until the air leak has healed. In some cases, surgery may be needed. Follow these instructions at home:  Only take over-the-counter or prescription medicines as directed by your health care provider.  If a  cough or pain makes it difficult for you to sleep at night, try sleeping in a semi-upright position in a recliner or by using 2 or 3 pillows.  Rest and limit activity as directed by your health care provider.  If you had a chest tube and it was removed, ask your health care provider when it is okay to remove the dressing. Until your health care provider says you can remove the dressing, do not allow it to get wet.  Do not smoke. Smoking is a  risk factor for pneumothorax.  Do not fly in an airplane or scuba dive until your health care provider says it is okay.  Follow up with your health care provider as directed. Get help right away if:  You have increasing chest pain or shortness of breath.  You have a cough that is not controlled with suppressants.  You begin coughing up blood.  You have pain that is getting worse or is not controlled with medicines.  You cough up thick, discolored mucus (sputum) that is yellow to green in color.  You have redness, increasing pain, or discharge at the site where a chest tube had been in place (if your pneumothorax was treated with a chest tube).  The site where your chest tube was located opens up.  You feel air coming out of the site where the chest tube was placed.  You have a fever or persistent symptoms for more than 2-3 days.  You have a fever and your symptoms suddenly get worse. This information is not intended to replace advice given to you by your health care provider. Make sure you discuss any questions you have with your health care provider. Document Released: 01/05/2005 Document Revised: 06/13/2015 Document Reviewed: 05/31/2013 Elsevier Interactive Patient Education  [employer].

## 2017-10-19 ENCOUNTER — Ambulatory Visit: Payer: Medicare Other | Admitting: Cardiology

## 2017-11-10 ENCOUNTER — Other Ambulatory Visit: Payer: Self-pay

## 2017-11-10 ENCOUNTER — Encounter: Payer: Self-pay | Admitting: Cardiothoracic Surgery

## 2017-11-10 ENCOUNTER — Ambulatory Visit (INDEPENDENT_AMBULATORY_CARE_PROVIDER_SITE_OTHER): Payer: Medicare Other | Admitting: Cardiothoracic Surgery

## 2017-11-10 ENCOUNTER — Ambulatory Visit
Admission: RE | Admit: 2017-11-10 | Discharge: 2017-11-10 | Disposition: A | Discharge status: Home / Self Care | Payer: Medicare Other | Source: Ambulatory Visit | Attending: Cardiothoracic Surgery | Admitting: Cardiothoracic Surgery

## 2017-11-10 VITALS — BP 132/78 | HR 76 | Resp 18 | Ht 62.0 in | Wt 115.0 lb

## 2017-11-10 DIAGNOSIS — R911 Solitary pulmonary nodule: Secondary | ICD-10-CM

## 2017-11-10 DIAGNOSIS — Z951 Presence of aortocoronary bypass graft: Secondary | ICD-10-CM

## 2017-11-10 DIAGNOSIS — I25119 Atherosclerotic heart disease of native coronary artery with unspecified angina pectoris: Secondary | ICD-10-CM

## 2017-11-10 NOTE — Progress Notes (Signed)
PCP is System, Provider Not In Referring Provider is Leonie Man, MD  Chief Complaint  Patient presents with  . Routine Post Op    f/u with chest xray, s/p CABG x4 09/24/17    HPI: Patient returns for scheduled one month follow-up after urgent CABG x4.  The patient was an active smoker preoperatively and had a 1.4 cm left upper lobe nodule on her preoperative x-ray confirmed by CT scan.  This was in the middle of the left upper lobe and was not resectable at the time of sternotomy and CABG.  The patient had significant postop pulmonary problems including spontaneous pneumothorax and subcutaneous air which eventually resolved.  She maintained sinus rhythm and her incisions healed well.  The patient is now walking at home half mile a day.  No recurrent symptoms of angina. She has stopped smoking Chest x-ray today shows clear lung fields except for the 1.4 cm left upper lobe nodule.  After she recovers more from her CABG she will need a transthoracic needle biopsy to establish diagnosis.  Probably her best therapy would be stereotactic radiation if the biopsy turns out to be  malignant.   Past Medical History:  Diagnosis Date  . AKI (acute kidney injury) ([place]) 05/16/2017  . Anxiety   . Barrett's esophagus without dysplasia   . CAD (coronary artery disease)    a. LHC 10/16/11: dLM 20%, mLAD 90%, pOM1 30%, mOM1 70% (FFR not hemodynamically significant), prox and mid RCA 30%, EF 65%;  b. PCI 10/16/11: Promus DES x 2 to mLAD  . COPD (chronic obstructive pulmonary disease) ([place])   . Depression   . Diverticulosis   . Essential hypertension   . Family history of adverse reaction to anesthesia    mother had "breathing problems"  . GERD (gastroesophageal reflux disease)   . Hyperlipidemia   . Rectocele     Past Surgical History:  Procedure Laterality Date  . ABDOMINAL HYSTERECTOMY    . CARDIAC CATHETERIZATION    . CARDIAC CATHETERIZATION N/A 06/21/2015   Procedure: Right/Left Heart Cath and  Coronary Angiography;  Surgeon: [name], MD;  Location: [place] [facility];  Service: Cardiovascular;  Laterality: N/A;  . CARDIAC CATHETERIZATION  09/16/2017  . COLONOSCOPY  01/2008   Multiple diverticula in desc and sigm colon  . CORONARY ARTERY BYPASS GRAFT N/A 09/24/2017   Procedure: CORONARY ARTERY BYPASS GRAFTING (CABG) x4 using mammary artery and saphenous vein. LIMA to LAD, SVG to RCA, SVG to RAMUS, SVT to OM. Endoscopic saphenous vein harvest.;  Surgeon: Ivin Poot, MD;  Location: [place];  Service: Open Heart Surgery;  Laterality: N/A;  . ESOPHAGOGASTRODUODENOSCOPY  01/2008   Barrett's esophagus, gastritis, no h.pylori, due follow-up 01/2011  . ESOPHAGOGASTRODUODENOSCOPY  04/06/2011   Procedure: ESOPHAGOGASTRODUODENOSCOPY (EGD);  Surgeon: Danie Binder, MD;  [facility];  Service: Endoscopy;  Laterality: N/A;  8:30  . FLEXIBLE SIGMOIDOSCOPY  08/11/2010 RECTAL PAIN/PRESSURE   SML IH, TICS  . FRACTIONAL FLOW RESERVE WIRE N/A 10/16/2011   Procedure: FRACTIONAL FLOW RESERVE WIRE;  Surgeon: [name], MD;  Location: [facility] CATH LAB;  Service: Cardiovascular;  Laterality: N/A;  . IR PERC PLEURAL DRAIN W/INDWELL CATH W/IMG GUIDE  10/01/2017  . LEFT HEART CATH AND CORONARY ANGIOGRAPHY N/A 09/16/2017   Procedure: LEFT HEART CATH AND CORONARY ANGIOGRAPHY;  Surgeon: [name], MD;  Location: [place] [facility];  Service: Cardiovascular;  Laterality: N/A;  . LEFT HEART CATHETERIZATION WITH CORONARY ANGIOGRAM N/A  03/07/2012   Procedure: LEFT HEART CATHETERIZATION WITH CORONARY ANGIOGRAM;  Surgeon: [name], MD;  Location: [facility] CATH LAB;  Service: Cardiovascular;  Laterality: N/A;  . LEFT HEART CATHETERIZATION WITH CORONARY ANGIOGRAM N/A 12/14/2012   Procedure: LEFT HEART CATHETERIZATION WITH CORONARY ANGIOGRAM;  Surgeon: [name], MD;  Location: [facility] CATH LAB;  Service: Cardiovascular;  Laterality: N/A;  . LEFT HEART  CATHETERIZATION WITH CORONARY ANGIOGRAM N/A 05/07/2014   Procedure: LEFT HEART CATHETERIZATION WITH CORONARY ANGIOGRAM;  Surgeon: [name], MD;  Location: [facility] CATH LAB;  Service: Cardiovascular;  Laterality: N/A;  . PARTIAL HYSTERECTOMY  1980s  . TEE WITHOUT CARDIOVERSION N/A 09/24/2017   Procedure: TRANSESOPHAGEAL ECHOCARDIOGRAM (TEE);  Surgeon: Prescott Gum, Collier Salina, MD;  Location: [place];  Service: Open Heart Surgery;  Laterality: N/A;    Family History  Problem Relation Age of Onset  . Coronary artery disease Father 41  . Heart attack Father 75  . GI problems Mother        Perforated colon   . Coronary artery disease Paternal Aunt   . Coronary artery disease Paternal Uncle   . Colon cancer Neg Hx   . Gastric cancer Neg Hx   . Esophageal cancer Neg Hx     Social History Social History   Tobacco Use  . Smoking status: Former Smoker    Packs/day: 1.00    Years: 53.00    Pack years: 53.00    Types: Cigarettes    Last attempt to quit: 03/20/2007    Years since quitting: 10.6  . Smokeless tobacco: Never Used  Substance Use Topics  . Alcohol use: No  . Drug use: No    Current Outpatient Medications  Medication Sig Dispense Refill  . ALPRAZolam (XANAX) 0.25 MG tablet Take 0.25 mg by mouth 2 (two) times daily as needed for anxiety.     Marland Kitchen aspirin EC 325 MG EC tablet Take 1 tablet (325 mg total) by mouth daily. 30 tablet 0  . atorvastatin (LIPITOR) 20 MG tablet Take 1 tablet (20 mg total) by mouth at bedtime. 30 tablet 11  . lisinopril (PRINIVIL,ZESTRIL) 5 MG tablet Take 1 tablet (5 mg total) by mouth daily. 30 tablet 11  . magnesium oxide (MAG-OX) 400 (241.3 Mg) MG tablet Take 0.5 tablets (200 mg total) by mouth daily. 30 tablet 1  . memantine (NAMENDA) 10 MG tablet Take 10 mg by mouth 2 (two) times daily.     . metoprolol tartrate (LOPRESSOR) 25 MG tablet Take 0.5 tablets (12.5 mg total) by mouth 2 (two) times daily. 60 tablet 3  . nitroGLYCERIN (NITROSTAT) 0.4 MG SL tablet  Place 1 tablet (0.4 mg total) under the tongue every 5 (five) minutes as needed for chest pain. 25 tablet 3  . pantoprazole (PROTONIX) 20 MG tablet Take 20 mg by mouth daily.    . traMADol (ULTRAM) 50 MG tablet Take 1 tablet (50 mg total) by mouth every 4 (four) hours as needed for moderate pain. 30 tablet 0  . venlafaxine XR (EFFEXOR-XR) 37.5 MG 24 hr capsule Take 37.5 mg by mouth at bedtime.    Marland Kitchen zolpidem (AMBIEN) 10 MG tablet Take 10 mg by mouth at bedtime.     No current facility-administered medications for this visit.     Allergies  Allergen Reactions  . Adhesive [Tape] Other (See Comments)    Turns skin red  . Sulfa Antibiotics Other (See Comments)    Childhood allergy    Review of Systems  Appetite and strength improving No edema No angina Incisions healing well No sternal click or pop sensation She is having left lower leg numbness and weakness which is recurrent due to lumbar disc disease and she is going to see herspine  physician for possible injection.  BP 132/78 (BP Location: Right Arm, Patient Position: Sitting, Cuff Size: Normal)   Pulse 76   Resp 18   Ht 5\' 2"  (1.575 m)   Wt 115 lb (52.2 kg)   SpO2 95% Comment: RA  BMI 21.03 kg/m  Physical Exam      Exam    General- alert and comfortable    Neck- no JVD, no cervical adenopathy palpable, no carotid bruit   Lungs- clear without rales, wheezes   Cor- regular rate and rhythm, no murmur , gallop   Abdomen- soft, non-tender   Extremities - warm, non-tender, minimal edema   Neuro- oriented, appropriate, no focal weakness   Diagnostic Tests: Chest x-ray clear without pneumothorax or subcutaneous air  Impression: Excellent early recovery after return home after urgent CABG x3.  Preserved LV function.  1.4 cm left upper lobe lung nodule which was evaluated preoperatively.  It was not possible to resect the nodule time of CABG because of its location.  This will be further assessed with a transthoracic  needle aspirate when  the patient covers more from CABG.  Plan: Return in 1 month review of progress.  The patient has declined formal cardiac rehab program since she lives in [place].  She will walk 20 minutes 4 days a week as her goal.  Continue current medications.  Patient knows she can resume driving and  light household activities but not to lift more than 10-15 pounds until she returns.   Len Childs, MD [facility] [phone]

## 2017-11-12 ENCOUNTER — Ambulatory Visit: Payer: Medicare Other | Admitting: Cardiovascular Disease

## 2017-12-14 ENCOUNTER — Other Ambulatory Visit: Payer: Self-pay | Admitting: Cardiothoracic Surgery

## 2017-12-14 DIAGNOSIS — I25119 Atherosclerotic heart disease of native coronary artery with unspecified angina pectoris: Secondary | ICD-10-CM

## 2017-12-15 ENCOUNTER — Other Ambulatory Visit: Payer: Self-pay

## 2017-12-15 ENCOUNTER — Other Ambulatory Visit: Payer: Self-pay | Admitting: *Deleted

## 2017-12-15 ENCOUNTER — Ambulatory Visit
Admission: RE | Admit: 2017-12-15 | Discharge: 2017-12-15 | Disposition: A | Discharge status: Home / Self Care | Payer: Medicare Other | Source: Ambulatory Visit | Attending: Cardiothoracic Surgery | Admitting: Cardiothoracic Surgery

## 2017-12-15 ENCOUNTER — Ambulatory Visit (INDEPENDENT_AMBULATORY_CARE_PROVIDER_SITE_OTHER): Payer: Self-pay | Admitting: Cardiothoracic Surgery

## 2017-12-15 ENCOUNTER — Encounter: Payer: Self-pay | Admitting: Cardiothoracic Surgery

## 2017-12-15 VITALS — BP 140/90 | HR 72 | Resp 16 | Ht 62.0 in | Wt 116.2 lb

## 2017-12-15 DIAGNOSIS — Z951 Presence of aortocoronary bypass graft: Secondary | ICD-10-CM

## 2017-12-15 DIAGNOSIS — I25119 Atherosclerotic heart disease of native coronary artery with unspecified angina pectoris: Secondary | ICD-10-CM

## 2017-12-15 DIAGNOSIS — R911 Solitary pulmonary nodule: Secondary | ICD-10-CM

## 2017-12-15 NOTE — Progress Notes (Signed)
PCP is System, Provider Not In Referring Provider is Leonie Man, MD  Chief Complaint  Patient presents with  . Routine Post Op    4 week f/u with CXR s/p CABG 10/04/17    HPI: Schedule II month postop visit after urgent CABG x4 after patient presented with unstable angina and left main stenosis with three-vessel CAD.  The patient has significant centrilobular emphysema with preoperative FEV1 1.6.  Patient had significant pulmonary problems after sternotomy and CABG including pneumothorax, persistent air leak from left long and significant subtendinous emphysema which prolonged her hospitalization several days.  Patient has history of's heavy smoking but has completely stopped since surgery.  Preoperative chest x-ray showed a 2 cm round nodule in the  left upper lobe [not present on prior chest x-ray 4 years previously] confirmed by CT scan prior to her CABG.  There are no abnormal mediastinal nodes but there was a 1.2 cm nodular density in the liver. The patient is recovering from her CABG and states her breathing and overall exercise tolerance is significantly improved.  She denies recurrent angina.  Surgical incisions are clear.  Chest x-ray shows evidence of COPD without pleural effusion, sternal wires intact.  She has maintaining sinus rhythm.  The left upper lobe nodule shows morphologic evidence of growing into a pulmonary artery branch which would make biopsy difficult.  Will obtain a CAT scan to assess the malignant potential of this left upper lobe nodule.  Past Medical History:  Diagnosis Date  . AKI (acute kidney injury) ([place]) 05/16/2017  . Anxiety   . Barrett's esophagus without dysplasia   . CAD (coronary artery disease)    a. LHC 10/16/11: dLM 20%, mLAD 90%, pOM1 30%, mOM1 70% (FFR not hemodynamically significant), prox and mid RCA 30%, EF 65%;  b. PCI 10/16/11: Promus DES x 2 to mLAD  . COPD (chronic obstructive pulmonary disease) ([place])   . Depression   . Diverticulosis   .  Essential hypertension   . Family history of adverse reaction to anesthesia    mother had "breathing problems"  . GERD (gastroesophageal reflux disease)   . Hyperlipidemia   . Rectocele     Past Surgical History:  Procedure Laterality Date  . ABDOMINAL HYSTERECTOMY    . CARDIAC CATHETERIZATION    . CARDIAC CATHETERIZATION N/A 06/21/2015   Procedure: Right/Left Heart Cath and Coronary Angiography;  Surgeon: [name], MD;  Location: [place] [facility];  Service: Cardiovascular;  Laterality: N/A;  . CARDIAC CATHETERIZATION  09/16/2017  . COLONOSCOPY  01/2008   Multiple diverticula in desc and sigm colon  . CORONARY ARTERY BYPASS GRAFT N/A 09/24/2017   Procedure: CORONARY ARTERY BYPASS GRAFTING (CABG) x4 using mammary artery and saphenous vein. LIMA to LAD, SVG to RCA, SVG to RAMUS, SVT to OM. Endoscopic saphenous vein harvest.;  Surgeon: Ivin Poot, MD;  Location: [place];  Service: Open Heart Surgery;  Laterality: N/A;  . ESOPHAGOGASTRODUODENOSCOPY  01/2008   Barrett's esophagus, gastritis, no h.pylori, due follow-up 01/2011  . ESOPHAGOGASTRODUODENOSCOPY  04/06/2011   Procedure: ESOPHAGOGASTRODUODENOSCOPY (EGD);  Surgeon: Danie Binder, MD;  [facility];  Service: Endoscopy;  Laterality: N/A;  8:30  . FLEXIBLE SIGMOIDOSCOPY  08/11/2010 RECTAL PAIN/PRESSURE   SML IH, TICS  . FRACTIONAL FLOW RESERVE WIRE N/A 10/16/2011   Procedure: FRACTIONAL FLOW RESERVE WIRE;  Surgeon: [name], MD;  Location: [facility] CATH LAB;  Service: Cardiovascular;  Laterality: N/A;  . IR PERC PLEURAL DRAIN W/INDWELL CATH  W/IMG GUIDE  10/01/2017  . LEFT HEART CATH AND CORONARY ANGIOGRAPHY N/A 09/16/2017   Procedure: LEFT HEART CATH AND CORONARY ANGIOGRAPHY;  Surgeon: [name], MD;  Location: [place] [facility];  Service: Cardiovascular;  Laterality: N/A;  . LEFT HEART CATHETERIZATION WITH CORONARY ANGIOGRAM N/A 03/07/2012   Procedure: LEFT HEART CATHETERIZATION WITH CORONARY  ANGIOGRAM;  Surgeon: [name], MD;  Location: [facility] CATH LAB;  Service: Cardiovascular;  Laterality: N/A;  . LEFT HEART CATHETERIZATION WITH CORONARY ANGIOGRAM N/A 12/14/2012   Procedure: LEFT HEART CATHETERIZATION WITH CORONARY ANGIOGRAM;  Surgeon: [name], MD;  Location: [facility] CATH LAB;  Service: Cardiovascular;  Laterality: N/A;  . LEFT HEART CATHETERIZATION WITH CORONARY ANGIOGRAM N/A 05/07/2014   Procedure: LEFT HEART CATHETERIZATION WITH CORONARY ANGIOGRAM;  Surgeon: [name], MD;  Location: [facility] CATH LAB;  Service: Cardiovascular;  Laterality: N/A;  . PARTIAL HYSTERECTOMY  1980s  . TEE WITHOUT CARDIOVERSION N/A 09/24/2017   Procedure: TRANSESOPHAGEAL ECHOCARDIOGRAM (TEE);  Surgeon: Prescott Gum, Collier Salina, MD;  Location: [place];  Service: Open Heart Surgery;  Laterality: N/A;    Family History  Problem Relation Age of Onset  . Coronary artery disease Father 54  . Heart attack Father 38  . GI problems Mother        Perforated colon   . Coronary artery disease Paternal Aunt   . Coronary artery disease Paternal Uncle   . Colon cancer Neg Hx   . Gastric cancer Neg Hx   . Esophageal cancer Neg Hx     Social History Social History   Tobacco Use  . Smoking status: Former Smoker    Packs/day: 1.00    Years: 53.00    Pack years: 53.00    Types: Cigarettes    Last attempt to quit: 03/20/2007    Years since quitting: 10.7  . Smokeless tobacco: Never Used  Substance Use Topics  . Alcohol use: No  . Drug use: No    Current Outpatient Medications  Medication Sig Dispense Refill  . ALPRAZolam (XANAX) 0.25 MG tablet Take 0.25 mg by mouth 2 (two) times daily as needed for anxiety.     Marland Kitchen aspirin EC 325 MG EC tablet Take 1 tablet (325 mg total) by mouth daily. 30 tablet 0  . atorvastatin (LIPITOR) 20 MG tablet Take 1 tablet (20 mg total) by mouth at bedtime. 30 tablet 11  . lisinopril (PRINIVIL,ZESTRIL) 5 MG tablet Take 1 tablet (5 mg total) by mouth daily. 30  tablet 11  . magnesium oxide (MAG-OX) 400 (241.3 Mg) MG tablet Take 0.5 tablets (200 mg total) by mouth daily. 30 tablet 1  . memantine (NAMENDA) 10 MG tablet Take 10 mg by mouth 2 (two) times daily.     . metoprolol tartrate (LOPRESSOR) 25 MG tablet Take 0.5 tablets (12.5 mg total) by mouth 2 (two) times daily. 60 tablet 3  . nitroGLYCERIN (NITROSTAT) 0.4 MG SL tablet Place 1 tablet (0.4 mg total) under the tongue every 5 (five) minutes as needed for chest pain. 25 tablet 3  . pantoprazole (PROTONIX) 20 MG tablet Take 20 mg by mouth daily.    Marland Kitchen venlafaxine XR (EFFEXOR-XR) 37.5 MG 24 hr capsule Take 37.5 mg by mouth at bedtime.    Marland Kitchen zolpidem (AMBIEN) 10 MG tablet Take 10 mg by mouth at bedtime.     No current facility-administered medications for this visit.     Allergies  Allergen Reactions  . Adhesive [Tape] Other (See Comments)  Turns skin red  . Sulfa Antibiotics Other (See Comments)    Childhood allergy    Review of Systems   Patient making progress after CABG but is not completely recovered.  BP 140/90 (BP Location: Left Arm, Patient Position: Sitting, Cuff Size: Normal) Comment (Cuff Size): MANUALLY  Pulse 72   Resp 16   Ht 5\' 2"  (1.575 m)   Wt 116 lb 3.2 oz (52.7 kg)   SpO2 96% Comment: RA  BMI 21.25 kg/m  Physical Exam      Exam    General- alert and comfortable    Neck- no JVD, no cervical adenopathy palpable, no carotid bruit   Lungs- clear without rales, wheezes   Cor- regular rate and rhythm, no murmur , gallop   Abdomen- soft, non-tender   Extremities - warm, non-tender, minimal edema   Neuro- oriented, appropriate, no focal weakness   Diagnostic Tests: Chest x-ray clear except for left upper lobe nodule  Impression: Left upper lobe 2 cm nodule suspicious for malignancy in a patient who underwent urgent multivessel CABG for left main stenosis 2 months ago and had significant postoperative pulmonary problems.  Transthoracic needle biopsy would be  problematic because of the adjacent pulmonary artery branch of the nodule. I have recommended PET scan to assess the malignant potential and to assess possibility of distant metastatic disease. Plan: Return to office after PET scan for further discussion of left upper lobe nodule. Patient understands she is still to follow sternal precautions and not lift more than 15-20 pounds until 3 months after surgery.  We discussed the importance of heart healthy lifestyle-activity and diet which she also understands.   Len Childs, MD [facility] [phone]

## 2017-12-24 ENCOUNTER — Encounter (HOSPITAL_COMMUNITY)
Admission: RE | Admit: 2017-12-24 | Discharge: 2017-12-24 | Disposition: A | Discharge status: Home / Self Care | Payer: Medicare Other | Source: Ambulatory Visit | Attending: Cardiothoracic Surgery | Admitting: Cardiothoracic Surgery

## 2017-12-24 DIAGNOSIS — R911 Solitary pulmonary nodule: Secondary | ICD-10-CM | POA: Diagnosis present

## 2017-12-24 LAB — GLUCOSE, CAPILLARY: Glucose-Capillary: 81 mg/dL (ref 70–99)

## 2017-12-24 MED ORDER — FLUDEOXYGLUCOSE F - 18 (FDG) INJECTION
5.6000 | Freq: Once | INTRAVENOUS | Status: AC
  Administered 2017-12-24: 5.6 via INTRAVENOUS

## 2017-12-28 ENCOUNTER — Ambulatory Visit (INDEPENDENT_AMBULATORY_CARE_PROVIDER_SITE_OTHER): Payer: Medicare Other | Admitting: Cardiothoracic Surgery

## 2017-12-28 ENCOUNTER — Encounter: Payer: Self-pay | Admitting: Cardiothoracic Surgery

## 2017-12-28 VITALS — BP 131/72 | HR 77 | Resp 20 | Ht 62.0 in | Wt 113.0 lb

## 2017-12-28 DIAGNOSIS — I25110 Atherosclerotic heart disease of native coronary artery with unstable angina pectoris: Secondary | ICD-10-CM | POA: Diagnosis not present

## 2017-12-28 DIAGNOSIS — Z951 Presence of aortocoronary bypass graft: Secondary | ICD-10-CM | POA: Diagnosis not present

## 2017-12-28 DIAGNOSIS — R911 Solitary pulmonary nodule: Secondary | ICD-10-CM

## 2017-12-28 NOTE — Progress Notes (Signed)
PCP is System, Provider Not In Referring Provider is Leonie Man, MD  Chief Complaint  Patient presents with  . Lung Lesion    2 week f/u review PET Scan 12/24/17, HX if CABG    HPI: Rhonda Hughes has recovered from urgent multivessel CABG x4 3 months ago when she presented with unstable angina.  An incidental left upper lobe 1.7 cm nodule was noted.  The nodule was not approachable at the time of sternotomy.  She returns now with PET scan to assess the solitary nodule with strong history of smoking.  She has stopped smoking since her heart surgery.  She denies any respiratory symptoms.  She feels much stronger and is trying to walk daily 15 to 20 minutes.  PET scan shows hypermetabolic activity of the left upper lobe 1.7 cm lobulated nodule.  There is no evidence of mediastinal or distant metastatic disease.  The patient had significant pulmonary problems after sternotomy with subcutaneous emphysema, no pneumothorax.  Her PFTs were checked pre-CABG and her FEV1 was 1.5.  The patient is not interested in having surgical resection after just having sternotomy and CABG.  We discussed proceeding with a IR CT-guided needle aspirate for tissue diagnosis followed by potential stereotactic body radiation therapy of the nodule which appears to be a early stage lung primary.  I feel that at age 77 this is a very appropriate plan for a early stage lung cancer.  Otherwise on her CT scan there were no other nodules or other at risk lesions.  She has moderate COPD-emphysema.  Past Medical History:  Diagnosis Date  . AKI (acute kidney injury) ([place]) 05/16/2017  . Anxiety   . Barrett's esophagus without dysplasia   . CAD (coronary artery disease)    a. LHC 10/16/11: dLM 20%, mLAD 90%, pOM1 30%, mOM1 70% (FFR not hemodynamically significant), prox and mid RCA 30%, EF 65%;  b. PCI 10/16/11: Promus DES x 2 to mLAD  . COPD (chronic obstructive pulmonary disease) ([place])   . Depression   . Diverticulosis   .  Essential hypertension   . Family history of adverse reaction to anesthesia    mother had "breathing problems"  . GERD (gastroesophageal reflux disease)   . Hyperlipidemia   . Rectocele     Past Surgical History:  Procedure Laterality Date  . ABDOMINAL HYSTERECTOMY    . CARDIAC CATHETERIZATION    . CARDIAC CATHETERIZATION N/A 06/21/2015   Procedure: Right/Left Heart Cath and Coronary Angiography;  Surgeon: [name], MD;  Location: [place] [facility];  Service: Cardiovascular;  Laterality: N/A;  . CARDIAC CATHETERIZATION  09/16/2017  . COLONOSCOPY  01/2008   Multiple diverticula in desc and sigm colon  . CORONARY ARTERY BYPASS GRAFT N/A 09/24/2017   Procedure: CORONARY ARTERY BYPASS GRAFTING (CABG) x4 using mammary artery and saphenous vein. LIMA to LAD, SVG to RCA, SVG to RAMUS, SVT to OM. Endoscopic saphenous vein harvest.;  Surgeon: Ivin Poot, MD;  Location: [place];  Service: Open Heart Surgery;  Laterality: N/A;  . ESOPHAGOGASTRODUODENOSCOPY  01/2008   Barrett's esophagus, gastritis, no h.pylori, due follow-up 01/2011  . ESOPHAGOGASTRODUODENOSCOPY  04/06/2011   Procedure: ESOPHAGOGASTRODUODENOSCOPY (EGD);  Surgeon: Danie Binder, MD;  [facility];  Service: Endoscopy;  Laterality: N/A;  8:30  . FLEXIBLE SIGMOIDOSCOPY  08/11/2010 RECTAL PAIN/PRESSURE   SML IH, TICS  . FRACTIONAL FLOW RESERVE WIRE N/A 10/16/2011   Procedure: FRACTIONAL FLOW RESERVE WIRE;  Surgeon: [name], MD;  Location:  [facility];  Service: Cardiovascular;  Laterality: N/A;  . IR PERC PLEURAL DRAIN W/INDWELL CATH W/IMG GUIDE  10/01/2017  . LEFT HEART CATH AND CORONARY ANGIOGRAPHY N/A 09/16/2017   Procedure: LEFT HEART CATH AND CORONARY ANGIOGRAPHY;  Surgeon: [name], MD;  Location: [place] [facility];  Service: Cardiovascular;  Laterality: N/A;  . LEFT HEART CATHETERIZATION WITH CORONARY ANGIOGRAM N/A 03/07/2012   Procedure: LEFT HEART CATHETERIZATION WITH CORONARY  ANGIOGRAM;  Surgeon: [name], MD;  Location: [facility] C[facility];  Service: Cardiovascular;  Laterality: N/A;  . LEFT HEART CATHETERIZATION WITH CORONARY ANGIOGRAM N/A 12/14/2012   Procedure: LEFT HEART CATHETERIZATION WITH CORONARY ANGIOGRAM;  Surgeon: [name], MD;  Location: [facility] CATH LAB;  Service: Cardiovascular;  Laterality: N/A;  . LEFT HEART CATHETERIZATION WITH CORONARY ANGIOGRAM N/A 05/07/2014   Procedure: LEFT HEART CATHETERIZATION WITH CORONARY ANGIOGRAM;  Surgeon: [name], MD;  Location: [facility] CATH LAB;  Service: Cardiovascular;  Laterality: N/A;  . PARTIAL HYSTERECTOMY  1980s  . TEE WITHOUT CARDIOVERSION N/A 09/24/2017   Procedure: TRANSESOPHAGEAL ECHOCARDIOGRAM (TEE);  Surgeon: Prescott Gum, Collier Salina, MD;  Location: [place];  Service: Open Heart Surgery;  Laterality: N/A;    Family History  Problem Relation Age of Onset  . Coronary artery disease Father 51  . Heart attack Father 46  . GI problems Mother        Perforated colon   . Coronary artery disease Paternal Aunt   . Coronary artery disease Paternal Uncle   . Colon cancer Neg Hx   . Gastric cancer Neg Hx   . Esophageal cancer Neg Hx     Social History Social History   Tobacco Use  . Smoking status: Former Smoker    Packs/day: 1.00    Years: 53.00    Pack years: 53.00    Types: Cigarettes    Last attempt to quit: 03/20/2007    Years since quitting: 10.7  . Smokeless tobacco: Never Used  Substance Use Topics  . Alcohol use: No  . Drug use: No    Current Outpatient Medications  Medication Sig Dispense Refill  . ALPRAZolam (XANAX) 0.25 MG tablet Take 0.25 mg by mouth 2 (two) times daily as needed for anxiety.     Marland Kitchen aspirin EC 325 MG EC tablet Take 1 tablet (325 mg total) by mouth daily. 30 tablet 0  . atorvastatin (LIPITOR) 20 MG tablet Take 1 tablet (20 mg total) by mouth at bedtime. 30 tablet 11  . lisinopril (PRINIVIL,ZESTRIL) 5 MG tablet Take 1 tablet (5 mg total) by mouth daily. 30  tablet 11  . magnesium oxide (MAG-OX) 400 (241.3 Mg) MG tablet Take 0.5 tablets (200 mg total) by mouth daily. 30 tablet 1  . memantine (NAMENDA) 10 MG tablet Take 10 mg by mouth 2 (two) times daily.     . metoprolol tartrate (LOPRESSOR) 25 MG tablet Take 0.5 tablets (12.5 mg total) by mouth 2 (two) times daily. 60 tablet 3  . nitroGLYCERIN (NITROSTAT) 0.4 MG SL tablet Place 1 tablet (0.4 mg total) under the tongue every 5 (five) minutes as needed for chest pain. 25 tablet 3  . pantoprazole (PROTONIX) 20 MG tablet Take 20 mg by mouth daily.    Marland Kitchen venlafaxine XR (EFFEXOR-XR) 37.5 MG 24 hr capsule Take 37.5 mg by mouth at bedtime.    Marland Kitchen zolpidem (AMBIEN) 10 MG tablet Take 10 mg by mouth at bedtime.     No current facility-administered medications for this visit.  Allergies  Allergen Reactions  . Adhesive [Tape] Other (See Comments)    Turns skin red  . Sulfa Antibiotics Other (See Comments)    Childhood allergy    Review of Systems  Not smoking No weight loss No fever No chest pain Sternal incision well-healed No edema Appetite and strength improving  BP 131/72   Pulse 77   Resp 20   Ht 5\' 2"  (1.575 m)   Wt 113 lb (51.3 kg)   SpO2 94% Comment: RA  BMI 20.67 kg/m  Physical Exam      Exam    General- alert and comfortable    Neck- no JVD, no cervical adenopathy palpable, no carotid bruit   Lungs- clear without rales, wheezes   Cor- regular rate and rhythm, no murmur , gallop   Abdomen- soft, non-tender   Extremities - warm, non-tender, minimal edema   Neuro- oriented, appropriate, no focal weakness   Diagnostic Tests: PET scan images show hypermetabolic activity of the left upper lobe nodule with SUV greater than 30.0.  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 Plan: Return after biopsy to discuss results   Len Childs, MD [facility] [phone]

## 2017-12-29 ENCOUNTER — Other Ambulatory Visit: Payer: Self-pay | Admitting: *Deleted

## 2017-12-29 DIAGNOSIS — R911 Solitary pulmonary nodule: Secondary | ICD-10-CM

## 2018-01-05 ENCOUNTER — Other Ambulatory Visit: Payer: Self-pay | Admitting: Radiology

## 2018-01-06 ENCOUNTER — Other Ambulatory Visit: Payer: Self-pay | Admitting: Radiology

## 2018-01-07 ENCOUNTER — Other Ambulatory Visit: Payer: Self-pay

## 2018-01-07 ENCOUNTER — Ambulatory Visit (HOSPITAL_COMMUNITY)
Admission: RE | Admit: 2018-01-07 | Discharge: 2018-01-07 | Disposition: A | Discharge status: Home / Self Care | Payer: Medicare Other | Source: Ambulatory Visit | Attending: Cardiothoracic Surgery | Admitting: Cardiothoracic Surgery

## 2018-01-07 ENCOUNTER — Encounter (HOSPITAL_COMMUNITY): Payer: Self-pay

## 2018-01-07 DIAGNOSIS — I2510 Atherosclerotic heart disease of native coronary artery without angina pectoris: Secondary | ICD-10-CM | POA: Insufficient documentation

## 2018-01-07 DIAGNOSIS — Z7982 Long term (current) use of aspirin: Secondary | ICD-10-CM | POA: Insufficient documentation

## 2018-01-07 DIAGNOSIS — Z79899 Other long term (current) drug therapy: Secondary | ICD-10-CM | POA: Insufficient documentation

## 2018-01-07 DIAGNOSIS — Z8249 Family history of ischemic heart disease and other diseases of the circulatory system: Secondary | ICD-10-CM | POA: Diagnosis not present

## 2018-01-07 DIAGNOSIS — Z882 Allergy status to sulfonamides status: Secondary | ICD-10-CM | POA: Insufficient documentation

## 2018-01-07 DIAGNOSIS — I10 Essential (primary) hypertension: Secondary | ICD-10-CM | POA: Diagnosis not present

## 2018-01-07 DIAGNOSIS — J449 Chronic obstructive pulmonary disease, unspecified: Secondary | ICD-10-CM | POA: Diagnosis not present

## 2018-01-07 DIAGNOSIS — R911 Solitary pulmonary nodule: Secondary | ICD-10-CM

## 2018-01-07 DIAGNOSIS — F419 Anxiety disorder, unspecified: Secondary | ICD-10-CM | POA: Diagnosis not present

## 2018-01-07 DIAGNOSIS — F329 Major depressive disorder, single episode, unspecified: Secondary | ICD-10-CM | POA: Insufficient documentation

## 2018-01-07 DIAGNOSIS — E785 Hyperlipidemia, unspecified: Secondary | ICD-10-CM | POA: Insufficient documentation

## 2018-01-07 DIAGNOSIS — C3412 Malignant neoplasm of upper lobe, left bronchus or lung: Secondary | ICD-10-CM | POA: Diagnosis not present

## 2018-01-07 DIAGNOSIS — K219 Gastro-esophageal reflux disease without esophagitis: Secondary | ICD-10-CM | POA: Insufficient documentation

## 2018-01-07 DIAGNOSIS — Z951 Presence of aortocoronary bypass graft: Secondary | ICD-10-CM | POA: Diagnosis not present

## 2018-01-07 DIAGNOSIS — Z87891 Personal history of nicotine dependence: Secondary | ICD-10-CM | POA: Diagnosis not present

## 2018-01-07 DIAGNOSIS — R918 Other nonspecific abnormal finding of lung field: Secondary | ICD-10-CM | POA: Diagnosis present

## 2018-01-07 LAB — CBC
HCT: 38.4 % (ref 36.0–46.0)
Hemoglobin: 12.7 g/dL (ref 12.0–15.0)
MCH: 31.1 pg (ref 26.0–34.0)
MCHC: 33.1 g/dL (ref 30.0–36.0)
MCV: 93.9 fL (ref 80.0–100.0)
Platelets: 230 10*3/uL (ref 150–400)
RBC: 4.09 MIL/uL (ref 3.87–5.11)
RDW: 14.4 % (ref 11.5–15.5)
WBC: 8.3 10*3/uL (ref 4.0–10.5)
nRBC: 0 % (ref 0.0–0.2)

## 2018-01-07 LAB — PROTIME-INR
INR: 0.95
PROTHROMBIN TIME: 12.6 s (ref 11.4–15.2)

## 2018-01-07 MED ORDER — FENTANYL CITRATE (PF) 100 MCG/2ML IJ SOLN
INTRAMUSCULAR | Status: AC | PRN
  Administered 2018-01-07 (×2): 50 ug via INTRAVENOUS

## 2018-01-07 MED ORDER — FENTANYL CITRATE (PF) 100 MCG/2ML IJ SOLN
INTRAMUSCULAR | Status: AC
  Filled 2018-01-07: qty 4

## 2018-01-07 MED ORDER — MIDAZOLAM HCL 2 MG/2ML IJ SOLN
INTRAMUSCULAR | Status: AC
  Filled 2018-01-07: qty 4

## 2018-01-07 MED ORDER — SODIUM CHLORIDE 0.9 % IV SOLN
INTRAVENOUS | Status: DC
  Administered 2018-01-07: 12:00:00 via INTRAVENOUS

## 2018-01-07 MED ORDER — LIDOCAINE HCL 1 % IJ SOLN
INTRAMUSCULAR | Status: AC
  Filled 2018-01-07: qty 20

## 2018-01-07 MED ORDER — MIDAZOLAM HCL 2 MG/2ML IJ SOLN
INTRAMUSCULAR | Status: AC | PRN
  Administered 2018-01-07 (×2): 1 mg via INTRAVENOUS

## 2018-01-07 NOTE — Sedation Documentation (Signed)
Patient denies pain and is resting comfortably.  

## 2018-01-07 NOTE — H&P (Signed)
Chief Complaint: Patient was seen in consultation today for left lung lesion biopsy at the request of Ivin Poot  Referring Physician(s): Ivin Poot  Supervising Physician: Aletta Edouard  Patient Status: [facility] - Out-pt  History of Present Illness: Rhonda Hughes is a 77 y.o. female   CABG 3 mo ago Previous smoker Work up revealed incidental Left lung lesion  PET:  IMPRESSION: 1. Known left upper lobe pulmonary nodule is markedly hypermetabolic consistent with malignancy. 2. No evidence for hypermetabolic lymphadenopathy in the left hilum or mediastinum. No distant hypermetabolic disease in the neck, abdomen, or pelvis. 3. 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 4.  Emphysema. 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 Plan: Return after biopsy to discuss results  Scheduled for left lung lesion biopsy  Past Medical History:  Diagnosis Date  . AKI (acute kidney injury) ([place]) 05/16/2017  . Anxiety   . Barrett's esophagus without dysplasia   . CAD (coronary artery disease)    a. LHC 10/16/11: dLM 20%, mLAD 90%, pOM1 30%, mOM1 70% (FFR not hemodynamically significant), prox and mid RCA 30%, EF 65%;  b. PCI 10/16/11: Promus DES x 2 to mLAD  . COPD (chronic obstructive pulmonary disease) ([place])   . Depression   . Diverticulosis   . Essential hypertension   . Family history of adverse reaction to anesthesia    mother had "breathing problems"  . GERD (gastroesophageal reflux disease)   . Hyperlipidemia   . Rectocele     Past Surgical History:  Procedure Laterality Date  . ABDOMINAL HYSTERECTOMY    . CARDIAC CATHETERIZATION    . CARDIAC CATHETERIZATION N/A 06/21/2015   Procedure: Right/Left Heart Cath and Coronary Angiography;   Surgeon: [name], MD;  Location: [place] [facility];  Service: Cardiovascular;  Laterality: N/A;  . CARDIAC CATHETERIZATION  09/16/2017  . COLONOSCOPY  01/2008   Multiple diverticula in desc and sigm colon  . CORONARY ARTERY BYPASS GRAFT N/A 09/24/2017   Procedure: CORONARY ARTERY BYPASS GRAFTING (CABG) x4 using mammary artery and saphenous vein. LIMA to LAD, SVG to RCA, SVG to RAMUS, SVT to OM. Endoscopic saphenous vein harvest.;  Surgeon: Ivin Poot, MD;  Location: [place];  Service: Open Heart Surgery;  Laterality: N/A;  . ESOPHAGOGASTRODUODENOSCOPY  01/2008   Barrett's esophagus, gastritis, no h.pylori, due follow-up 01/2011  . ESOPHAGOGASTRODUODENOSCOPY  04/06/2011   Procedure: ESOPHAGOGASTRODUODENOSCOPY (EGD);  Surgeon: Danie Binder, MD;  [facility];  Service: Endoscopy;  Laterality: N/A;  8:30  . FLEXIBLE SIGMOIDOSCOPY  08/11/2010 RECTAL PAIN/PRESSURE   SML IH, TICS  . FRACTIONAL FLOW RESERVE WIRE N/A 10/16/2011   Procedure: FRACTIONAL FLOW RESERVE WIRE;  Surgeon: [name], MD;  Location: [facility] CATH LAB;  Service: Cardiovascular;  Laterality: N/A;  . IR PERC PLEURAL DRAIN W/INDWELL CATH W/IMG GUIDE  10/01/2017  . LEFT HEART CATH AND CORONARY ANGIOGRAPHY N/A 09/16/2017   Procedure: LEFT HEART CATH AND CORONARY ANGIOGRAPHY;  Surgeon: [name], MD;  Location: [place] [facility];  Service: Cardiovascular;  Laterality: N/A;  . LEFT HEART CATHETERIZATION WITH CORONARY ANGIOGRAM N/A 03/07/2012   Procedure: LEFT HEART CATHETERIZATION WITH CORONARY ANGIOGRAM;  Surgeon: [name], MD;  Location: [facility] CATH LAB;  Service: Cardiovascular;  Laterality: N/A;  . LEFT HEART CATHETERIZATION  WITH CORONARY ANGIOGRAM N/A 12/14/2012   Procedure: LEFT HEART CATHETERIZATION WITH CORONARY ANGIOGRAM;  Surgeon: [name], MD;  Location: [facility] CATH LAB;  Service: Cardiovascular;  Laterality: N/A;  . LEFT HEART CATHETERIZATION WITH CORONARY  ANGIOGRAM N/A 05/07/2014   Procedure: LEFT HEART CATHETERIZATION WITH CORONARY ANGIOGRAM;  Surgeon: [name], MD;  Location: [facility] C[facility] LAB;  Service: Cardiovascular;  Laterality: N/A;  . PARTIAL HYSTERECTOMY  1980s  . TEE WITHOUT CARDIOVERSION N/A 09/24/2017   Procedure: TRANSESOPHAGEAL ECHOCARDIOGRAM (TEE);  Surgeon: Prescott Gum, Collier Salina, MD;  Location: [place];  Service: Open Heart Surgery;  Laterality: N/A;    Allergies: Adhesive [tape] and Sulfa antibiotics  Medications: Prior to Admission medications   Medication Sig Start Date End Date Taking? Authorizing Provider  ALPRAZolam (XANAX) 0.25 MG tablet Take 0.25 mg by mouth 2 (two) times daily as needed for anxiety.    Yes [provider]  aspirin EC 325 MG EC tablet Take 1 tablet (325 mg total) by mouth daily. 10/09/17  Yes Barrett, Erin R, PA-C  atorvastatin (LIPITOR) 20 MG tablet Take 1 tablet (20 mg total) by mouth at bedtime. 09/17/17  Yes Barrett, Evelene Croon, PA-C  lisinopril (PRINIVIL,ZESTRIL) 5 MG tablet Take 1 tablet (5 mg total) by mouth daily. 10/09/17 10/09/18 Yes Barrett, Erin R, PA-C  magnesium oxide (MAG-OX) 400 (241.3 Mg) MG tablet Take 0.5 tablets (200 mg total) by mouth daily. 09/18/17  Yes Barrett, Evelene Croon, PA-C  memantine (NAMENDA) 10 MG tablet Take 10 mg by mouth 2 (two) times daily.  05/04/17  Yes [provider]  metoprolol tartrate (LOPRESSOR) 25 MG tablet Take 0.5 tablets (12.5 mg total) by mouth 2 (two) times daily. 10/09/17  Yes Barrett, Erin R, PA-C  pantoprazole (PROTONIX) 20 MG tablet Take 20 mg by mouth daily.   Yes [provider]  venlafaxine XR (EFFEXOR-XR) 37.5 MG 24 hr capsule Take 37.5 mg by mouth at bedtime.   Yes [provider]  zolpidem (AMBIEN) 10 MG tablet Take 10 mg by mouth at bedtime.   Yes [provider]  nitroGLYCERIN (NITROSTAT) 0.4 MG SL tablet Place 1 tablet (0.4 mg total) under the tongue every 5 (five) minutes as needed for chest pain. 09/17/17    Barrett, Evelene Croon, PA-C  niacin (NIASPAN) 500 MG CR tablet Take 500 mg by mouth at bedtime.    04/01/11  [provider]     Family History  Problem Relation Age of Onset  . Coronary artery disease Father 25  . Heart attack Father 66  . GI problems Mother        Perforated colon   . Coronary artery disease Paternal Aunt   . Coronary artery disease Paternal Uncle   . Colon cancer Neg Hx   . Gastric cancer Neg Hx   . Esophageal cancer Neg Hx     Social History   Socioeconomic History  . Marital status: Married    Spouse name: Not on file  . Number of children: 1  . Years of education: Not on file  . Highest education level: Not on file  Occupational History  . Occupation: Retired Child psychotherapist    Comment: retired    Fish farm manager: RETIRED  Social Needs  . Financial resource strain: Not on file  . Food insecurity:    Worry: Not on file    Inability: Not on file  . Transportation needs:    Medical: Not on file    Non-medical: Not on file  Tobacco  Use  . Smoking status: Former Smoker    Packs/day: 1.00    Years: 53.00    Pack years: 53.00    Types: Cigarettes    Last attempt to quit: 03/20/2007    Years since quitting: 10.8  . Smokeless tobacco: Never Used  Substance and Sexual Activity  . Alcohol use: No  . Drug use: No  . Sexual activity: Yes  Lifestyle  . Physical activity:    Days per week: Not on file    Minutes per session: Not on file  . Stress: Not on file  Relationships  . Social connections:    Talks on phone: Not on file    Gets together: Not on file    Attends religious service: Not on file    Active member of club or organization: Not on file    Attends meetings of clubs or organizations: Not on file    Relationship status: Not on file  Other Topics Concern  . Not on file  Social History Narrative  . Not on file     Review of Systems: A 12 point ROS discussed and pertinent positives are indicated in the HPI above.  All other  systems are negative.  Review of Systems  Constitutional: Negative for activity change, fatigue and fever.  Respiratory: Negative for cough and shortness of breath.   Neurological: Positive for weakness.  Psychiatric/Behavioral: Negative for behavioral problems and confusion.    Vital Signs: BP (!) 151/70   Pulse 67   Temp 98.8 F (37.1 C)   Resp 18   SpO2 98%   Physical Exam Vitals signs reviewed.  Cardiovascular:     Rate and Rhythm: Normal rate and regular rhythm.  Pulmonary:     Effort: Pulmonary effort is normal.     Breath sounds: Normal breath sounds.  Abdominal:     General: Bowel sounds are normal.     Palpations: Abdomen is soft.  Musculoskeletal: Normal range of motion.  Skin:    General: Skin is warm and dry.  Neurological:     General: No focal deficit present.     Mental Status: She is oriented to person, place, and time.  Psychiatric:        Mood and Affect: Mood normal.        Behavior: Behavior normal.        Thought Content: Thought content normal.        Judgment: Judgment normal.     Imaging: Dg Chest 2 View  Result Date: 12/15/2017 CLINICAL DATA:  Pulmonary nodule. Status post coronary artery bypass grafting EXAM: CHEST - 2 VIEW COMPARISON:  November 10, 2017 chest radiograph and September 17, 2017 chest CT FINDINGS: The previously noted left upper lobe pulmonary nodular lesion measures 1.7 x 1.6 cm, essentially stable compared to recent studies. No edema or consolidation. Emphysematous change with decreased vascularity in underlying bullous disease in the upper lobes, better appreciated on recent CT. The heart size and pulmonary vascularity are normal. No adenopathy. Patient is status post coronary artery bypass grafting. No adenopathy. There is aortic atherosclerosis. No bone lesions. IMPRESSION: Persistent left upper lobe pulmonary nodular lesion. Neoplasm must be a consideration given this finding. Correlation with PET study advised in this regard. No  edema or consolidation. There is a degree of underlying emphysematous change, stable. Stable coronary artery bypass grafting. Foci of aortic atherosclerosis noted. Aortic Atherosclerosis (ICD10-I70.0). These results will be called to the ordering clinician or representative by the Radiologist Assistant, and communication  documented in the PACS or zVision Dashboard. Electronically Signed   By: Lowella Grip III M.D.   On: 12/15/2017 11:29   Nm Pet Image Initial (pi) Skull Base To Thigh  Result Date: 12/26/2017 CLINICAL DATA:  Initial treatment strategy for pulmonary nodule. EXAM: NUCLEAR MEDICINE PET SKULL BASE TO THIGH TECHNIQUE: 5.6 mCi F-18 FDG was injected intravenously. Full-ring PET imaging was performed from the skull base to thigh after the radiotracer. CT data was obtained and used for attenuation correction and anatomic localization. Fasting blood glucose: 81 mg/dl COMPARISON:  Chest CT 09/17/2017. FINDINGS: Mediastinal blood pool activity: SUV max 2.1 NECK: No hypermetabolic lymph nodes in the neck. Incidental CT findings: none CHEST: Multi lobular left upper lobe nodule measuring up to 17 mm on today's study is markedly hypermetabolic with SUV max = 56.3. No hypermetabolic activity in the left hilum or mediastinum to suggest metastatic disease. 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 Incidental CT findings: Emphysema noted in the lungs bilaterally. Patient is status post CABG. No pleural effusion. Small right thyroid nodule without FDG uptake on PET imaging. ABDOMEN/PELVIS: No abnormal hypermetabolic activity within the liver, pancreas, adrenal glands, or spleen. No hypermetabolic lymph nodes in the abdomen or pelvis. Incidental CT findings: 10 mm lesion in the left liver is likely a cyst. There is abdominal aortic atherosclerosis without aneurysm. Diverticular changes noted in the left colon. SKELETON: No focal hypermetabolic activity to suggest skeletal  metastasis. Incidental CT findings: No worrisome lytic or sclerotic osseous abnormality. IMPRESSION: 1. Known left upper lobe pulmonary nodule is markedly hypermetabolic consistent with malignancy. 2. No evidence for hypermetabolic lymphadenopathy in the left hilum or mediastinum. No distant hypermetabolic disease in the neck, abdomen, or pelvis. 3. 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 4.  Emphysema. (ICD10-J43.9) 5.  Aortic Atherosclerois (ICD10-170.0) Electronically Signed   By: Misty Stanley M.D.   On: 12/26/2017 23:57    Labs:  CBC: Recent Labs    09/29/17 0226 10/01/17 0400 10/03/17 0440 01/07/18 0906  WBC 11.0* 13.7* 12.8* 8.3  HGB 9.4* 9.9* 9.7* 12.7  HCT 29.3* 30.2* 30.4* 38.4  PLT 237 393 462* 230    COAGS: Recent Labs    09/21/17 0902 09/24/17 1352 09/29/17 1132 01/07/18 0906  INR 0.96 1.39 1.03 0.95  APTT 36 31  --   --     BMP: Recent Labs    09/30/17 0436 10/01/17 0400 10/02/17 0644 10/04/17 0502  NA 137 136 133* 132*  K 3.5 3.5 4.6 4.1  CL 100 98 96* 95*  CO2 29 26 29 26   GLUCOSE 104* 159* 123* 101*  BUN 5* <5* <5* 5*  CALCIUM 8.4* 8.6* 9.0 8.8*  CREATININE 0.71 0.67 0.67 0.71  GFRNONAA >60 >60 >60 >60  GFRAA >60 >60 >60 >60    LIVER FUNCTION TESTS: Recent Labs    05/19/17 0619 09/17/17 0409 09/21/17 0902  BILITOT  --  1.0 0.8  AST  --  20 23  ALT  --  16 19  ALKPHOS  --  70 77  PROT  --  6.4* 7.5  ALBUMIN 3.0* 3.5 4.1    TUMOR MARKERS: No results for input(s): AFPTM, CEA, CA199, CHROMGRNA in the last 8760 hours.  Assessment and Plan:  Left lung lesion +PET Prev smoker Scheduled for bx today Risks and benefits discussed with the patient including, but not limited to bleeding, hemoptysis, respiratory failure requiring intubation, infection, pneumothorax requiring chest tube  placement, stroke from air embolism or even death.  All of the patient's questions were answered, patient is agreeable to  proceed. Consent signed and in chart.   Thank you for this interesting consult.  I greatly enjoyed meeting Rhonda Hughes and look forward to participating in their care.  A copy of this report was sent to the requesting provider on this date.  Electronically Signed: Lavonia Drafts, PA-C 01/07/2018, 10:29 AM   I spent a total of  30 Minutes   in face to face in clinical consultation, greater than 50% of which was counseling/coordinating care for left lung lesion bx

## 2018-01-07 NOTE — Discharge Instructions (Signed)
Needle Biopsy of the Lung, Care After °This sheet gives you information about how to care for yourself after your procedure. Your health care provider may also give you more specific instructions. If you have problems or questions, contact your health care provider. °What can I expect after the procedure? °After the procedure, it is common to have: °· Soreness, pain, and tenderness where a tissue sample was taken (biopsy site). °· A cough. °· A sore throat. °Follow these instructions at home: °Biopsy site care °· Follow instructions from your health care provider about when to remove the bandage that was placed on the biopsy site. °· Keep the bandage dry until it has been removed. °· Check your biopsy site every day for signs of infection. Check for: °? More redness, swelling, or pain. °? More fluid or blood. °? Warmth to the touch. °? Pus or a bad smell. °General instructions ° °· Rest as directed by your health care provider. Ask your health care provider what activities are safe for you. °· Do not take baths, swim, or use a hot tub until your health care provider approves. °· Take over-the-counter and prescription medicines only as told by your health care provider. °· If you have airplane travel scheduled, talk with your health care provider about when it is safe for you to travel by airplane. °· It is up to you to get the results of your procedure. Ask your health care provider, or the department that is doing the procedure, when your results will be ready. °· Keep all follow-up visits as told by your health care provider. This is important. °Contact a health care provider if: °· You have more redness, swelling, or pain around your biopsy site. °· You have more fluid or blood coming from your biopsy site. °· Your biopsy site feels warm to the touch. °· You have pus or a bad smell coming from your biopsy site. °· You have a fever. °· You have pain that does not get better with medicine. °Get help right away  if: °· You have problems breathing. °· You have chest pain. °· You cough up blood. °· You faint. °· You have a fast heart rate. °Summary °· After a needle biopsy of the lung, it is common to have a cough, a sore throat, or soreness, pain, and tenderness where a tissue sample was taken (biopsy site). °· You should check your biopsy area every day for signs of infection, including pus or a bad smell, warmth, more fluid or blood, or more redness, swelling, or pain. °· You should not take baths, swim, or use a hot tub until your health care provider approves. °· It is up to you to get the results of your procedure. Ask your health care provider, or the department that is doing the procedure, when your results will be ready. °This information is not intended to replace advice given to you by your health care provider. Make sure you discuss any questions you have with your health care provider. °Document Released: 11/02/2006 Document Revised: 11/27/2015 Document Reviewed: 11/27/2015 °Elsevier Interactive Patient Education © 2019 [employer] ° ° ° °Moderate Conscious Sedation, Adult, Care After °These instructions provide you with information about caring for yourself after your procedure. Your health care provider may also give you more specific instructions. Your treatment has been planned according to current medical practices, but problems sometimes occur. Call your health care provider if you have any problems or questions after your procedure. °What can I expect   after the procedure? °After your procedure, it is common: °· To feel sleepy for several hours. °· To feel clumsy and have poor balance for several hours. °· To have poor judgment for several hours. °· To vomit if you eat too soon. °Follow these instructions at home: °For at least 24 hours after the procedure: ° °· Do not: °? Participate in activities where you could fall or become injured. °? Drive. °? Use heavy machinery. °? Drink alcohol. °? Take sleeping  pills or medicines that cause drowsiness. °? Make important decisions or sign legal documents. °? Take care of children on your own. °· Rest. °Eating and drinking °· Follow the diet recommended by your health care provider. °· If you vomit: °? Drink water, juice, or soup when you can drink without vomiting. °? Make sure you have little or no nausea before eating solid foods. °General instructions °· Have a responsible adult stay with you until you are awake and alert. °· Take over-the-counter and prescription medicines only as told by your health care provider. °· If you smoke, do not smoke without supervision. °· Keep all follow-up visits as told by your health care provider. This is important. °Contact a health care provider if: °· You keep feeling nauseous or you keep vomiting. °· You feel light-headed. °· You develop a rash. °· You have a fever. °Get help right away if: °· You have trouble breathing. °This information is not intended to replace advice given to you by your health care provider. Make sure you discuss any questions you have with your health care provider. °Document Released: 10/26/2012 Document Revised: 06/10/2015 Document Reviewed: 04/27/2015 °Elsevier Interactive Patient Education © 2019 Elsevier Inc. ° ° °

## 2018-01-07 NOTE — Procedures (Signed)
Interventional Radiology Procedure Note  Procedure: CT Guided Biopsy of LUL lung nodule  Complications: None  Estimated Blood Loss: < 10 mL  Findings: 18 G core biopsy of 1.7 cm LUL lung nodule performed under CT guidance.  Two core samples obtained and sent to Pathology. Post CT shows no pneumothorax. Minimal hemorrhage adjacent to nodule. Biosentry used to deposit pleural plug.  Venetia Night. Kathlene Cote, M.D Pager:  [phone]

## 2018-01-11 ENCOUNTER — Telehealth: Payer: Self-pay | Admitting: *Deleted

## 2018-01-11 ENCOUNTER — Encounter: Payer: Self-pay | Admitting: Gastroenterology

## 2018-01-11 DIAGNOSIS — R911 Solitary pulmonary nodule: Secondary | ICD-10-CM

## 2018-01-11 NOTE — Telephone Encounter (Signed)
Oncology Nurse Navigator Documentation  Oncology Nurse Navigator Flowsheets 01/11/2018  Navigator Location [facility]  Referral date to RadOnc/MedOnc 01/11/2018  Navigator Encounter Type Telephone/I received a referral on Rhonda Hughes today.  I called her with an appt to be seen on 01/13/18.  She verbalized understanding of appt time and place.   Telephone Outgoing Call  Treatment Phase Pre-Tx/Tx Discussion  Barriers/Navigation Needs Education;Coordination of Care  Education Other  Interventions Coordination of Care;Education  Coordination of Care Appts  Education Method Verbal  Acuity Level 2  Acuity Level 2 Initial guidance, education and coordination as needed  Time Spent with Patient 30

## 2018-01-13 ENCOUNTER — Encounter: Payer: Self-pay | Admitting: Internal Medicine

## 2018-01-13 ENCOUNTER — Inpatient Hospital Stay: Payer: Medicare Other | Attending: Internal Medicine

## 2018-01-13 ENCOUNTER — Inpatient Hospital Stay (HOSPITAL_BASED_OUTPATIENT_CLINIC_OR_DEPARTMENT_OTHER): Payer: Medicare Other | Admitting: Internal Medicine

## 2018-01-13 VITALS — BP 145/76 | HR 78 | Temp 98.3°F | Resp 17 | Ht 62.0 in | Wt 113.5 lb

## 2018-01-13 DIAGNOSIS — E041 Nontoxic single thyroid nodule: Secondary | ICD-10-CM | POA: Diagnosis not present

## 2018-01-13 DIAGNOSIS — C3412 Malignant neoplasm of upper lobe, left bronchus or lung: Secondary | ICD-10-CM

## 2018-01-13 DIAGNOSIS — K2270 Barrett's esophagus without dysplasia: Secondary | ICD-10-CM | POA: Insufficient documentation

## 2018-01-13 DIAGNOSIS — J449 Chronic obstructive pulmonary disease, unspecified: Secondary | ICD-10-CM | POA: Insufficient documentation

## 2018-01-13 DIAGNOSIS — Z7982 Long term (current) use of aspirin: Secondary | ICD-10-CM | POA: Insufficient documentation

## 2018-01-13 DIAGNOSIS — C3492 Malignant neoplasm of unspecified part of left bronchus or lung: Secondary | ICD-10-CM

## 2018-01-13 DIAGNOSIS — F329 Major depressive disorder, single episode, unspecified: Secondary | ICD-10-CM | POA: Diagnosis not present

## 2018-01-13 DIAGNOSIS — I10 Essential (primary) hypertension: Secondary | ICD-10-CM | POA: Insufficient documentation

## 2018-01-13 DIAGNOSIS — Z955 Presence of coronary angioplasty implant and graft: Secondary | ICD-10-CM | POA: Diagnosis not present

## 2018-01-13 DIAGNOSIS — R911 Solitary pulmonary nodule: Secondary | ICD-10-CM

## 2018-01-13 DIAGNOSIS — Z87891 Personal history of nicotine dependence: Secondary | ICD-10-CM | POA: Diagnosis not present

## 2018-01-13 DIAGNOSIS — I2510 Atherosclerotic heart disease of native coronary artery without angina pectoris: Secondary | ICD-10-CM | POA: Diagnosis not present

## 2018-01-13 DIAGNOSIS — I700 Atherosclerosis of aorta: Secondary | ICD-10-CM | POA: Insufficient documentation

## 2018-01-13 DIAGNOSIS — E785 Hyperlipidemia, unspecified: Secondary | ICD-10-CM | POA: Insufficient documentation

## 2018-01-13 DIAGNOSIS — K219 Gastro-esophageal reflux disease without esophagitis: Secondary | ICD-10-CM | POA: Diagnosis not present

## 2018-01-13 DIAGNOSIS — F419 Anxiety disorder, unspecified: Secondary | ICD-10-CM | POA: Insufficient documentation

## 2018-01-13 DIAGNOSIS — Z79899 Other long term (current) drug therapy: Secondary | ICD-10-CM | POA: Insufficient documentation

## 2018-01-13 DIAGNOSIS — Z951 Presence of aortocoronary bypass graft: Secondary | ICD-10-CM | POA: Insufficient documentation

## 2018-01-13 DIAGNOSIS — Z7189 Other specified counseling: Secondary | ICD-10-CM | POA: Insufficient documentation

## 2018-01-13 LAB — CMP (CANCER CENTER ONLY)
ALT: 14 U/L (ref 0–44)
AST: 18 U/L (ref 15–41)
Albumin: 3.8 g/dL (ref 3.5–5.0)
Alkaline Phosphatase: 78 U/L (ref 38–126)
Anion gap: 10 (ref 5–15)
BUN: 8 mg/dL (ref 8–23)
CO2: 30 mmol/L (ref 22–32)
Calcium: 9.6 mg/dL (ref 8.9–10.3)
Chloride: 101 mmol/L (ref 98–111)
Creatinine: 1.02 mg/dL — ABNORMAL HIGH (ref 0.44–1.00)
GFR, Est AFR Am: >60 mL/min (ref >60)
GFR, Estimated: 54 mL/min — ABNORMAL LOW (ref >60)
Glucose, Bld: 118 mg/dL — ABNORMAL HIGH (ref 70–99)
Potassium: 3.3 mmol/L — ABNORMAL LOW (ref 3.5–5.1)
Sodium: 141 mmol/L (ref 135–145)
Total Bilirubin: 0.8 mg/dL (ref 0.3–1.2)
Total Protein: 7.3 g/dL (ref 6.5–8.1)

## 2018-01-13 LAB — CBC WITH DIFFERENTIAL (CANCER CENTER ONLY)
Abs Immature Granulocytes: 0.02 10*3/uL (ref 0.00–0.07)
Basophils Absolute: 0 10*3/uL (ref 0.0–0.1)
Basophils Relative: 0 %
Eosinophils Absolute: 0.1 10*3/uL (ref 0.0–0.5)
Eosinophils Relative: 1 %
HCT: 40.6 % (ref 36.0–46.0)
Hemoglobin: 13 g/dL (ref 12.0–15.0)
Immature Granulocytes: 0 %
Lymphocytes Relative: 23 %
Lymphs Abs: 2.2 10*3/uL (ref 0.7–4.0)
MCH: 30.3 pg (ref 26.0–34.0)
MCHC: 32 g/dL (ref 30.0–36.0)
MCV: 94.6 fL (ref 80.0–100.0)
Monocytes Absolute: 0.6 10*3/uL (ref 0.1–1.0)
Monocytes Relative: 6 %
Neutro Abs: 6.6 10*3/uL (ref 1.7–7.7)
Neutrophils Relative %: 70 %
Platelet Count: 247 10*3/uL (ref 150–400)
RBC: 4.29 MIL/uL (ref 3.87–5.11)
RDW: 14.6 % (ref 11.5–15.5)
WBC Count: 9.6 10*3/uL (ref 4.0–10.5)
nRBC: 0 % (ref 0.0–0.2)

## 2018-01-13 NOTE — Progress Notes (Signed)
[facility] [phone]   Fax:([phone]  CONSULT NOTE  REFERRING PHYSICIAN: Dr. Tharon Aquas Trigt  REASON FOR CONSULTATION:  77 years old white female recently diagnosed with lung cancer.  HPI Rhonda Hughes is a 77 y.o. female with past medical history significant for coronary artery disease status post CABG, Barrett's esophagus, depression, hypertension, dyslipidemia as well as long history for smoking but quit 10 years ago.  The patient had preoperative chest x-ray on 09/17/2017 and it showed 2 cm nodular opacity noted over the left upper lobe.  This is suspicious for tumor such as primary lung cancer.  This was followed by CT scan of the chest with contrast on 09/17/2017 and that showed chronic emphysema with confirmed 2.0 cm left upper lobe nodule new since 2013 and highly suspicious for bronchogenic carcinoma despite relatively small was contours.  No associated mediastinal lymphadenopathy.  There was also a small 1.1 cm indeterminate low-density lesion in the central liver also new since 2013.  The patient underwent the coronary arteries bypass surgery which was complicated with subcutaneous emphysema and she spent around 2 weeks in the hospital.  The patient had a PET scan on December 24, 2017 and that showed hypermetabolic left upper lobe pulmonary nodule with no evidence of hypermetabolic lymphadenopathy or distant metastasis.  On January 07, 2018 she underwent CT-guided core biopsy of the left upper lobe pulmonary nodule by interventional radiology and the final pathology [id number]) was consistent with small cell carcinoma. The malignant cells are positive for CD56 and chromogranin. They are negative for CD45. Dr. Prescott Gum kindly referred the patient to me today for evaluation and recommendation regarding treatment of her condition. When seen today the patient is feeling fine with no symptoms.  She denied having any chest pain, shortness of breath, cough  or hemoptysis.  She denied having any fever or chills.  She has no nausea, vomiting, diarrhea or constipation.  She has no headache or visual changes.  She lost few pounds during her hospitalization.  She has no fever or chills. Family history significant for father with coronary artery disease, mother had perforated colon and COPD. The patient is married and has 1 son.  She was accompanied by her husband Rhonda Hughes.  She used to work as a Theme park manager.  She has a history of smoking 1 pack/day for around 50 years and quit 10 years ago.  She has no history of alcohol or drug abuse.  HPI  Past Medical History:  Diagnosis Date  . AKI (acute kidney injury) ([place]) 05/16/2017  . Anxiety   . Barrett's esophagus without dysplasia   . CAD (coronary artery disease)    a. LHC 10/16/11: dLM 20%, mLAD 90%, pOM1 30%, mOM1 70% (FFR not hemodynamically significant), prox and mid RCA 30%, EF 65%;  b. PCI 10/16/11: Promus DES x 2 to mLAD  . COPD (chronic obstructive pulmonary disease) ([place])   . Depression   . Diverticulosis   . Essential hypertension   . Family history of adverse reaction to anesthesia    mother had "breathing problems"  . GERD (gastroesophageal reflux disease)   . Hyperlipidemia   . Rectocele     Past Surgical History:  Procedure Laterality Date  . ABDOMINAL HYSTERECTOMY    . CARDIAC CATHETERIZATION    . CARDIAC CATHETERIZATION N/A 06/21/2015   Procedure: Right/Left Heart Cath and Coronary Angiography;  Surgeon: [name], MD;  Location: [place] [facility];  Service: Cardiovascular;  Laterality: N/A;  .  CARDIAC CATHETERIZATION  09/16/2017  . COLONOSCOPY  01/2008   Multiple diverticula in desc and sigm colon  . CORONARY ARTERY BYPASS GRAFT N/A 09/24/2017   Procedure: CORONARY ARTERY BYPASS GRAFTING (CABG) x4 using mammary artery and saphenous vein. LIMA to LAD, SVG to RCA, SVG to RAMUS, SVT to OM. Endoscopic saphenous vein harvest.;  Surgeon: Ivin Poot, MD;  Location: [facility];   Service: Open Heart Surgery;  Laterality: N/A;  . ESOPHAGOGASTRODUODENOSCOPY  01/2008   Barrett's esophagus, gastritis, no h.pylori, due follow-up 01/2011  . ESOPHAGOGASTRODUODENOSCOPY  04/06/2011   Procedure: ESOPHAGOGASTRODUODENOSCOPY (EGD);  Surgeon: Danie Binder, MD;  [facility];  Service: Endoscopy;  Laterality: N/A;  8:30  . FLEXIBLE SIGMOIDOSCOPY  08/11/2010 RECTAL PAIN/PRESSURE   SML IH, TICS  . FRACTIONAL FLOW RESERVE WIRE N/A 10/16/2011   Procedure: FRACTIONAL FLOW RESERVE WIRE;  Surgeon: [name], MD;  Location: [facility] CATH LAB;  Service: Cardiovascular;  Laterality: N/A;  . IR PERC PLEURAL DRAIN W/INDWELL CATH W/IMG GUIDE  10/01/2017  . LEFT HEART CATH AND CORONARY ANGIOGRAPHY N/A 09/16/2017   Procedure: LEFT HEART CATH AND CORONARY ANGIOGRAPHY;  Surgeon: [name], MD;  Location: [place] CV LAB;  Service: Cardiovascular;  Laterality: N/A;  . LEFT HEART CATHETERIZATION WITH CORONARY ANGIOGRAM N/A 03/07/2012   Procedure: LEFT HEART CATHETERIZATION WITH CORONARY ANGIOGRAM;  Surgeon: [name], MD;  Location: [facility] CATH LAB;  Service: Cardiovascular;  Laterality: N/A;  . LEFT HEART CATHETERIZATION WITH CORONARY ANGIOGRAM N/A 12/14/2012   Procedure: LEFT HEART CATHETERIZATION WITH CORONARY ANGIOGRAM;  Surgeon: [name], MD;  Location: [facility] CATH LAB;  Service: Cardiovascular;  Laterality: N/A;  . LEFT HEART CATHETERIZATION WITH CORONARY ANGIOGRAM N/A 05/07/2014   Procedure: LEFT HEART CATHETERIZATION WITH CORONARY ANGIOGRAM;  Surgeon: [name], MD;  Location: [facility] CATH LAB;  Service: Cardiovascular;  Laterality: N/A;  . PARTIAL HYSTERECTOMY  1980s  . TEE WITHOUT CARDIOVERSION N/A 09/24/2017   Procedure: TRANSESOPHAGEAL ECHOCARDIOGRAM (TEE);  Surgeon: Prescott Gum, Collier Salina, MD;  Location: [place];  Service: Open Heart Surgery;  Laterality: N/A;    Family History  Problem Relation Age of Onset  . Coronary artery disease Father  74  . Heart attack Father 79  . GI problems Mother        Perforated colon   . Coronary artery disease Paternal Aunt   . Coronary artery disease Paternal Uncle   . Colon cancer Neg Hx   . Gastric cancer Neg Hx   . Esophageal cancer Neg Hx     Social History Social History   Tobacco Use  . Smoking status: Former Smoker    Packs/day: 1.00    Years: 53.00    Pack years: 53.00    Types: Cigarettes    Last attempt to quit: 03/20/2007    Years since quitting: 10.8  . Smokeless tobacco: Never Used  Substance Use Topics  . Alcohol use: No  . Drug use: No    Allergies  Allergen Reactions  . Adhesive [Tape] Other (See Comments)    Turns skin red  . Sulfa Antibiotics Other (See Comments)    Childhood allergy    Current Outpatient Medications  Medication Sig Dispense Refill  . ALPRAZolam (XANAX) 0.25 MG tablet Take 0.25 mg by mouth 2 (two) times daily as needed for anxiety.     Marland Kitchen aspirin EC 325 MG EC tablet Take 1 tablet (325 mg total) by mouth daily. 30 tablet 0  . atorvastatin (LIPITOR) 20  MG tablet Take 1 tablet (20 mg total) by mouth at bedtime. 30 tablet 11  . lisinopril (PRINIVIL,ZESTRIL) 5 MG tablet Take 1 tablet (5 mg total) by mouth daily. 30 tablet 11  . magnesium oxide (MAG-OX) 400 (241.3 Mg) MG tablet Take 0.5 tablets (200 mg total) by mouth daily. 30 tablet 1  . memantine (NAMENDA) 10 MG tablet Take 10 mg by mouth 2 (two) times daily.     . metoprolol tartrate (LOPRESSOR) 25 MG tablet Take 0.5 tablets (12.5 mg total) by mouth 2 (two) times daily. 60 tablet 3  . nitroGLYCERIN (NITROSTAT) 0.4 MG SL tablet Place 1 tablet (0.4 mg total) under the tongue every 5 (five) minutes as needed for chest pain. 25 tablet 3  . pantoprazole (PROTONIX) 20 MG tablet Take 20 mg by mouth daily.    Marland Kitchen venlafaxine XR (EFFEXOR-XR) 37.5 MG 24 hr capsule Take 37.5 mg by mouth at bedtime.    Marland Kitchen zolpidem (AMBIEN) 10 MG tablet Take 10 mg by mouth at bedtime.     No current facility-administered  medications for this visit.     Review of Systems  Constitutional: positive for weight loss Eyes: negative Ears, nose, mouth, throat, and face: negative Respiratory: negative Cardiovascular: negative Gastrointestinal: negative Genitourinary:negative Integument/breast: negative Hematologic/lymphatic: negative Musculoskeletal:negative Neurological: negative Behavioral/Psych: negative Endocrine: negative Allergic/Immunologic: negative  Physical Exam  CWC:BJSEG, healthy, no distress, well nourished, well developed and anxious SKIN: skin color, texture, turgor are normal, no rashes or significant lesions HEAD: Normocephalic, No masses, lesions, tenderness or abnormalities EYES: normal, PERRLA, Conjunctiva are pink and non-injected EARS: External ears normal, Canals clear OROPHARYNX:no exudate, no erythema and lips, buccal mucosa, and tongue normal  NECK: supple, no adenopathy, no JVD LYMPH:  no palpable lymphadenopathy, no hepatosplenomegaly BREAST:not examined LUNGS: clear to auscultation , and palpation HEART: regular rate & rhythm, no murmurs and no gallops ABDOMEN:abdomen soft, non-tender, normal bowel sounds and no masses or organomegaly BACK: No CVA tenderness, Range of motion is normal EXTREMITIES:no joint deformities, effusion, or inflammation, no edema  NEURO: alert & oriented x 3 with fluent speech, no focal motor/sensory deficits  PERFORMANCE STATUS: ECOG 1  LABORATORY DATA: Lab Results  Component Value Date   WBC 9.6 01/13/2018   HGB 13.0 01/13/2018   HCT 40.6 01/13/2018   MCV 94.6 01/13/2018   PLT 247 01/13/2018      Chemistry      Component Value Date/Time   NA 141 01/13/2018 1219   NA 133 (L) 09/13/2017 1623   K 3.3 (L) 01/13/2018 1219   K 3.8 06/23/2010 1140   CL 101 01/13/2018 1219   CO2 30 01/13/2018 1219   BUN 8 01/13/2018 1219   BUN 5 (L) 09/13/2017 1623   CREATININE 1.02 (H) 01/13/2018 1219   CREATININE 0.95 (H) 06/14/2015 1531        Component Value Date/Time   CALCIUM 9.6 01/13/2018 1219   ALKPHOS 78 01/13/2018 1219   AST 18 01/13/2018 1219   ALT 14 01/13/2018 1219   ALT 21.0 06/23/2010 1140   BILITOT 0.8 01/13/2018 1219       RADIOGRAPHIC STUDIES: Dg Chest 2 View  Result Date: 12/15/2017 CLINICAL DATA:  Pulmonary nodule. Status post coronary artery bypass grafting EXAM: CHEST - 2 VIEW COMPARISON:  November 10, 2017 chest radiograph and September 17, 2017 chest CT FINDINGS: The previously noted left upper lobe pulmonary nodular lesion measures 1.7 x 1.6 cm, essentially stable compared to recent studies. No edema or consolidation. Emphysematous change with  decreased vascularity in underlying bullous disease in the upper lobes, better appreciated on recent CT. The heart size and pulmonary vascularity are normal. No adenopathy. Patient is status post coronary artery bypass grafting. No adenopathy. There is aortic atherosclerosis. No bone lesions. IMPRESSION: Persistent left upper lobe pulmonary nodular lesion. Neoplasm must be a consideration given this finding. Correlation with PET study advised in this regard. No edema or consolidation. There is a degree of underlying emphysematous change, stable. Stable coronary artery bypass grafting. Foci of aortic atherosclerosis noted. Aortic Atherosclerosis (ICD10-I70.0). These results will be called to the ordering clinician or representative by the Radiologist Assistant, and communication documented in the PACS or zVision Dashboard. Electronically Signed   By: Lowella Grip III M.D.   On: 12/15/2017 11:29   Nm Pet Image Initial (pi) Skull Base To Thigh  Result Date: 12/26/2017 CLINICAL DATA:  Initial treatment strategy for pulmonary nodule. EXAM: NUCLEAR MEDICINE PET SKULL BASE TO THIGH TECHNIQUE: 5.6 mCi F-18 FDG was injected intravenously. Full-ring PET imaging was performed from the skull base to thigh after the radiotracer. CT data was obtained and used for attenuation correction  and anatomic localization. Fasting blood glucose: 81 mg/dl COMPARISON:  Chest CT 09/17/2017. FINDINGS: Mediastinal blood pool activity: SUV max 2.1 NECK: No hypermetabolic lymph nodes in the neck. Incidental CT findings: none CHEST: Multi lobular left upper lobe nodule measuring up to 17 mm on today's study is markedly hypermetabolic with SUV max = 16.6. No hypermetabolic activity in the left hilum or mediastinum to suggest metastatic disease. 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 Incidental CT findings: Emphysema noted in the lungs bilaterally. Patient is status post CABG. No pleural effusion. Small right thyroid nodule without FDG uptake on PET imaging. ABDOMEN/PELVIS: No abnormal hypermetabolic activity within the liver, pancreas, adrenal glands, or spleen. No hypermetabolic lymph nodes in the abdomen or pelvis. Incidental CT findings: 10 mm lesion in the left liver is likely a cyst. There is abdominal aortic atherosclerosis without aneurysm. Diverticular changes noted in the left colon. SKELETON: No focal hypermetabolic activity to suggest skeletal metastasis. Incidental CT findings: No worrisome lytic or sclerotic osseous abnormality. IMPRESSION: 1. Known left upper lobe pulmonary nodule is markedly hypermetabolic consistent with malignancy. 2. No evidence for hypermetabolic lymphadenopathy in the left hilum or mediastinum. No distant hypermetabolic disease in the neck, abdomen, or pelvis. 3. 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 4.  Emphysema. (ICD10-J43.9) 5.  Aortic Atherosclerois (ICD10-170.0) Electronically Signed   By: Misty Stanley M.D.   On: 12/26/2017 23:57   Ct Biopsy  Result Date: 01/07/2018 CLINICAL DATA:  1.7 cm left upper lobe lung nodule demonstrating increased metabolic activity. Former history of smoking. EXAM: CT GUIDED CORE BIOPSY OF LEFT UPPER LOBE LUNG NODULE ANESTHESIA/SEDATION: 2.0 mg IV Versed; 100 mcg IV  Fentanyl Total Moderate Sedation Time:  30 minutes. The patient's level of consciousness and physiologic status were continuously monitored during the procedure by Radiology nursing. PROCEDURE: The procedure risks, benefits, and alternatives were explained to the patient. Questions regarding the procedure were encouraged and answered. The patient understands and consents to the procedure. A time-out was performed prior to initiating the procedure. Localizing CT was performed in a supine position through the upper lobes bilaterally. The left anterior chest wall was prepped with chlorhexidine in a sterile fashion, and a sterile drape was applied covering the operative field. A sterile gown and sterile gloves were used for the procedure. Local anesthesia  was provided with 1% Lidocaine. Under CT guidance, a 17 gauge trocar needle was advanced to the level of a left upper lobe lung nodule from an anterior approach. After confirming needle tip position, 2 separate coaxial 18 gauge core biopsy samples were obtained and submitted in formalin. The Biosentry device was used in depositing a plug at the pleural entry site. Additional CT was performed. COMPLICATIONS: None FINDINGS: Lobulated nodule in the left upper lobe measures 1.7-1.8 cm in greatest diameter. Solid tissue was obtained with biopsy. Post biopsy imaging shows a minimal amount hemorrhage anterior and posterior to the nodule after biopsy. No pneumothorax was identified. IMPRESSION: CT-guided core biopsy performed of a 1.7-1.8 cm left upper lobe lung nodule. The procedure was uncomplicated with only a minimal amount of hemorrhage present adjacent to the nodule after biopsy. Electronically Signed   By: Aletta Edouard M.D.   On: 01/07/2018 14:05    ASSESSMENT: This is a very pleasant 77 years old white female recently diagnosed with a stage IA (T1b, N0, M0) small cell lung cancer presented with solitary left upper lobe pulmonary nodule diagnosed in December  2019.   PLAN: I had a lengthy discussion with the patient and her husband today about her current disease stage, prognosis and treatment options. I personally and independently reviewed her scan images and discussed the result and showed the images to the patient and her husband today. I discussed with the patient the option of surgical resection versus a course of concurrent chemoradiation with cisplatin and etoposide. The patient has a solitary nodule in the left upper lobe and I think it will be very appropriate to undergo surgical resection of this peripheral nodule at this point.  This could be followed by adjuvant chemotherapy if the final pathology confirms her current diagnosis of small cell carcinoma. The patient agreed to consider the surgical resection of her tumor.  I will refer her back to Dr. Prescott Gum for reevaluation and discussion of this option. I will see her back for follow-up visit after her surgical evaluation for discussion of any adjuvant therapy if needed. The patient was advised to call immediately if she has any concerning symptoms in the interval.  The patient voices understanding of current disease status and treatment options and is in agreement with the current care plan.  All questions were answered. The patient knows to call the clinic with any problems, questions or concerns. We can certainly see the patient much sooner if necessary.  Thank you so much for allowing me to participate in the care of Rhonda Hughes. I will continue to follow up the patient with you and assist in her care.  I spent 40 minutes counseling the patient face to face. The total time spent in the appointment was 60 minutes.  Disclaimer: This note was dictated with voice recognition software. Similar sounding words can inadvertently be transcribed and may not be corrected upon review.   Eilleen Kempf January 13, 2018, 1:23 PM

## 2018-01-17 ENCOUNTER — Other Ambulatory Visit: Payer: Self-pay | Admitting: *Deleted

## 2018-01-17 DIAGNOSIS — C3490 Malignant neoplasm of unspecified part of unspecified bronchus or lung: Secondary | ICD-10-CM

## 2018-01-20 ENCOUNTER — Ambulatory Visit (HOSPITAL_COMMUNITY)
Admission: RE | Admit: 2018-01-20 | Discharge: 2018-01-20 | Disposition: A | Discharge status: Home / Self Care | Payer: Medicare Other | Source: Ambulatory Visit | Attending: Cardiothoracic Surgery | Admitting: Cardiothoracic Surgery

## 2018-01-20 DIAGNOSIS — C3412 Malignant neoplasm of upper lobe, left bronchus or lung: Secondary | ICD-10-CM | POA: Diagnosis present

## 2018-01-20 DIAGNOSIS — C3490 Malignant neoplasm of unspecified part of unspecified bronchus or lung: Secondary | ICD-10-CM

## 2018-01-20 LAB — PULMONARY FUNCTION TEST
DL/VA % pred: 57 %
DL/VA: 2.55 ml/min/mmHg/L
DLCO cor % pred: 52 %
DLCO cor: 10.96 ml/min/mmHg
DLCO unc % pred: 51 %
DLCO unc: 10.82 ml/min/mmHg
FEF 25-75 Post: 1.02 L/sec
FEF 25-75 Pre: 0.46 L/sec
FEF2575-%Change-Post: 119 %
FEF2575-%Pred-Post: 67 %
FEF2575-%Pred-Pre: 30 %
FEV1-%Change-Post: 25 %
FEV1-%Pred-Post: 85 %
FEV1-%Pred-Pre: 68 %
FEV1-Post: 1.61 L
FEV1-Pre: 1.29 L
FEV1FVC-%Change-Post: 0 %
FEV1FVC-%Pred-Pre: 72 %
FEV6-%Change-Post: 22 %
FEV6-%Pred-Post: 112 %
FEV6-%Pred-Pre: 91 %
FEV6-Post: 2.68 L
FEV6-Pre: 2.18 L
FEV6FVC-%Change-Post: -1 %
FEV6FVC-%Pred-Post: 96 %
FEV6FVC-%Pred-Pre: 97 %
FVC-%Change-Post: 24 %
FVC-%Pred-Post: 117 %
FVC-%Pred-Pre: 93 %
FVC-Post: 2.94 L
FVC-Pre: 2.36 L
Post FEV1/FVC ratio: 55 %
Post FEV6/FVC ratio: 91 %
Pre FEV1/FVC ratio: 54 %
Pre FEV6/FVC Ratio: 92 %
RV % pred: 141 %
RV: 3.07 L
TLC % pred: 120 %
TLC: 5.62 L

## 2018-01-20 MED ORDER — ALBUTEROL SULFATE (2.5 MG/3ML) 0.083% IN NEBU
2.5000 mg | INHALATION_SOLUTION | Freq: Once | RESPIRATORY_TRACT | Status: AC
  Administered 2018-01-20: 2.5 mg via RESPIRATORY_TRACT

## 2018-01-21 ENCOUNTER — Telehealth: Payer: Self-pay | Admitting: *Deleted

## 2018-01-21 NOTE — Telephone Encounter (Signed)
Oncology Nurse Navigator Documentation  Oncology Nurse Navigator Flowsheets 01/21/2018  Navigator Location [facility]  Navigator Encounter Type Telephone/I followed up on Rhonda Hughes's schedule.  She had her PFT's yesterday and will see Rhonda Hughes on 01/25/17.  I called to check on her but I was unable to reach her.  I did leave a vm message with my name.   Telephone Outgoing Call  Abnormal Finding Date 09/17/2017  Confirmed Diagnosis Date 01/07/2018  Treatment Phase Pre-Tx/Tx Discussion  Barriers/Navigation Needs Education  Education Other  Interventions Education  Education Method Verbal  Acuity Level 1  Time Spent with Patient 15

## 2018-01-23 ENCOUNTER — Other Ambulatory Visit (HOSPITAL_COMMUNITY): Payer: Self-pay | Admitting: Physician Assistant

## 2018-01-24 ENCOUNTER — Encounter: Payer: Self-pay | Admitting: Cardiovascular Disease

## 2018-01-24 ENCOUNTER — Encounter: Payer: Self-pay | Admitting: *Deleted

## 2018-01-24 ENCOUNTER — Encounter: Payer: Self-pay | Admitting: Cardiothoracic Surgery

## 2018-01-24 ENCOUNTER — Ambulatory Visit (INDEPENDENT_AMBULATORY_CARE_PROVIDER_SITE_OTHER): Payer: Medicare Other | Admitting: Cardiothoracic Surgery

## 2018-01-24 ENCOUNTER — Ambulatory Visit (INDEPENDENT_AMBULATORY_CARE_PROVIDER_SITE_OTHER): Payer: Medicare Other | Admitting: Cardiovascular Disease

## 2018-01-24 ENCOUNTER — Other Ambulatory Visit: Payer: Self-pay | Admitting: *Deleted

## 2018-01-24 ENCOUNTER — Other Ambulatory Visit: Payer: Self-pay

## 2018-01-24 VITALS — BP 164/80 | HR 58 | Ht 62.0 in | Wt 113.2 lb

## 2018-01-24 VITALS — BP 140/76 | HR 64 | Resp 20 | Ht 62.0 in | Wt 113.0 lb

## 2018-01-24 DIAGNOSIS — I25118 Atherosclerotic heart disease of native coronary artery with other forms of angina pectoris: Secondary | ICD-10-CM | POA: Diagnosis not present

## 2018-01-24 DIAGNOSIS — Z951 Presence of aortocoronary bypass graft: Secondary | ICD-10-CM | POA: Diagnosis not present

## 2018-01-24 DIAGNOSIS — C3412 Malignant neoplasm of upper lobe, left bronchus or lung: Secondary | ICD-10-CM

## 2018-01-24 DIAGNOSIS — E7800 Pure hypercholesterolemia, unspecified: Secondary | ICD-10-CM

## 2018-01-24 DIAGNOSIS — I10 Essential (primary) hypertension: Secondary | ICD-10-CM

## 2018-01-24 NOTE — Progress Notes (Signed)
PCP is System, Provider Not In Referring Provider is Leonie Man, MD  Chief Complaint  Patient presents with  . Lung Lesion    f/u PFTs 01/20/2018, s/p CABG x4 09/24/17    HPI: Patient returns to discuss results of transthoracic needle biopsy of a 1.8 cm left upper lobe round nodular density.  This was seen as an incidental finding when admitted in September for urgent CABG x4 after she developed unstable angina with left main and three-vessel CAD.  The nodule is very hypermetabolic on PET scan and there is no other evidence of metastatic disease.  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  The patient has been evaluated by oncology, Dr. Earlie Server.  He has recommended surgical resection the patient is a surgical candidate.  The patient has stopped smoking.  Her PFTs show FEV1 of 1.4 and diffusion capacity of 54%.  She has regained her strength after her CABG and her weight is also normal.  At age 78 she would be at some increased risk for left upper lobectomy and this was discussed in detail with the patient and her husband.  The tumor is in the central portion of the left upper lobe would most likely require lobectomy.  She has adequate cardiopulmonary reserve to tolerate left upper lobectomy.  Prior to surgery she would need a brain MRI to rule out metastatic disease, but if that is clear she is willing to proceed with left VATS and left upper lobectomy.  She understands the risks of bleeding, pneumonia, prolonged air leak, infection, and death.  I have discussed the procedure in detail including the use of general anesthesia, location of the surgical incision, and the expected postoperative recovery.  She demonstrates understanding of these issues and agrees to proceed with surgery which we scheduled on January 20 if the brain MRI is negative.   Past Medical History:  Diagnosis Date  . AKI (acute kidney injury) ([place]) 05/16/2017  . Anxiety   . Barrett's esophagus without dysplasia   . CAD  (coronary artery disease)    a. LHC 10/16/11: dLM 20%, mLAD 90%, pOM1 30%, mOM1 70% (FFR not hemodynamically significant), prox and mid RCA 30%, EF 65%;  b. PCI 10/16/11: Promus DES x 2 to mLAD  . COPD (chronic obstructive pulmonary disease) ([place])   . Depression   . Diverticulosis   . Essential hypertension   . Family history of adverse reaction to anesthesia    mother had "breathing problems"  . GERD (gastroesophageal reflux disease)   . Hyperlipidemia   . Rectocele     Past Surgical History:  Procedure Laterality Date  . ABDOMINAL HYSTERECTOMY    . CARDIAC CATHETERIZATION    . CARDIAC CATHETERIZATION N/A 06/21/2015   Procedure: Right/Left Heart Cath and Coronary Angiography;  Surgeon: [name], MD;  Location: [place] [facility];  Service: Cardiovascular;  Laterality: N/A;  . CARDIAC CATHETERIZATION  09/16/2017  . COLONOSCOPY  01/2008   Multiple diverticula in desc and sigm colon  . CORONARY ARTERY BYPASS GRAFT N/A 09/24/2017   Procedure: CORONARY ARTERY BYPASS GRAFTING (CABG) x4 using mammary artery and saphenous vein. LIMA to LAD, SVG to RCA, SVG to RAMUS, SVT to OM. Endoscopic saphenous vein harvest.;  Surgeon: Ivin Poot, MD;  Location: [place];  Service: Open Heart Surgery;  Laterality: N/A;  . ESOPHAGOGASTRODUODENOSCOPY  01/2008   Barrett's esophagus, gastritis, no h.pylori, due follow-up 01/2011  . ESOPHAGOGASTRODUODENOSCOPY  04/06/2011   Procedure: ESOPHAGOGASTRODUODENOSCOPY (EGD);  Surgeon: Marga Melnick  Fields, MD;  [facility];  Service: Endoscopy;  Laterality: N/A;  8:30  . FLEXIBLE SIGMOIDOSCOPY  08/11/2010 RECTAL PAIN/PRESSURE   SML IH, TICS  . FRACTIONAL FLOW RESERVE WIRE N/A 10/16/2011   Procedure: FRACTIONAL FLOW RESERVE WIRE;  Surgeon: [name], MD;  Location: [facility] CATH LAB;  Service: Cardiovascular;  Laterality: N/A;  . IR PERC PLEURAL DRAIN W/INDWELL CATH W/IMG GUIDE  10/01/2017  . LEFT HEART CATH AND CORONARY ANGIOGRAPHY N/A 09/16/2017    Procedure: LEFT HEART CATH AND CORONARY ANGIOGRAPHY;  Surgeon: [name], MD;  Location: [place] [facility];  Service: Cardiovascular;  Laterality: N/A;  . LEFT HEART CATHETERIZATION WITH CORONARY ANGIOGRAM N/A 03/07/2012   Procedure: LEFT HEART CATHETERIZATION WITH CORONARY ANGIOGRAM;  Surgeon: [name], MD;  Location: [facility] CATH LAB;  Service: Cardiovascular;  Laterality: N/A;  . LEFT HEART CATHETERIZATION WITH CORONARY ANGIOGRAM N/A 12/14/2012   Procedure: LEFT HEART CATHETERIZATION WITH CORONARY ANGIOGRAM;  Surgeon: [name], MD;  Location: [facility] CATH LAB;  Service: Cardiovascular;  Laterality: N/A;  . LEFT HEART CATHETERIZATION WITH CORONARY ANGIOGRAM N/A 05/07/2014   Procedure: LEFT HEART CATHETERIZATION WITH CORONARY ANGIOGRAM;  Surgeon: [name], MD;  Location: [facility] CATH LAB;  Service: Cardiovascular;  Laterality: N/A;  . PARTIAL HYSTERECTOMY  1980s  . TEE WITHOUT CARDIOVERSION N/A 09/24/2017   Procedure: TRANSESOPHAGEAL ECHOCARDIOGRAM (TEE);  Surgeon: Prescott Gum, Collier Salina, MD;  Location: [place];  Service: Open Heart Surgery;  Laterality: N/A;    Family History  Problem Relation Age of Onset  . Coronary artery disease Father 42  . Heart attack Father 60  . GI problems Mother        Perforated colon   . Coronary artery disease Paternal Aunt   . Coronary artery disease Paternal Uncle   . Colon cancer Neg Hx   . Gastric cancer Neg Hx   . Esophageal cancer Neg Hx     Social History Social History   Tobacco Use  . Smoking status: Former Smoker    Packs/day: 1.00    Years: 53.00    Pack years: 53.00    Types: Cigarettes    Last attempt to quit: 03/20/2007    Years since quitting: 10.8  . Smokeless tobacco: Never Used  Substance Use Topics  . Alcohol use: No  . Drug use: No    Current Outpatient Medications  Medication Sig Dispense Refill  . ALPRAZolam (XANAX) 0.25 MG tablet Take 0.25 mg by mouth 2 (two) times daily as needed for  anxiety.     Marland Kitchen aspirin EC 325 MG EC tablet Take 1 tablet (325 mg total) by mouth daily. 30 tablet 0  . atorvastatin (LIPITOR) 20 MG tablet Take 1 tablet (20 mg total) by mouth at bedtime. 30 tablet 11  . lisinopril (PRINIVIL,ZESTRIL) 5 MG tablet Take 1 tablet (5 mg total) by mouth daily. 30 tablet 11  . magnesium oxide (MAG-OX) 400 (241.3 Mg) MG tablet Take 0.5 tablets (200 mg total) by mouth daily. 30 tablet 1  . memantine (NAMENDA) 10 MG tablet Take 10 mg by mouth 2 (two) times daily.     . metoprolol tartrate (LOPRESSOR) 25 MG tablet Take 0.5 tablets (12.5 mg total) by mouth 2 (two) times daily. 60 tablet 3  . nitroGLYCERIN (NITROSTAT) 0.4 MG SL tablet Place 1 tablet (0.4 mg total) under the tongue every 5 (five) minutes as needed for chest pain. 25 tablet 3  . pantoprazole (PROTONIX) 20 MG tablet Take 20  mg by mouth daily.    Marland Kitchen venlafaxine XR (EFFEXOR-XR) 37.5 MG 24 hr capsule Take 37.5 mg by mouth at bedtime.    Marland Kitchen zolpidem (AMBIEN) 10 MG tablet Take 10 mg by mouth at bedtime.     No current facility-administered medications for this visit.     Allergies  Allergen Reactions  . Adhesive [Tape] Other (See Comments)    Turns skin red  . Sulfa Antibiotics Other (See Comments)    Childhood allergy    Review of Systems   Weight stable Cough No congestion Feels stronger and feels good, no recurrent angina Seen by cardiology today and felt to be doing well.  BP 140/76   Pulse 64   Resp 20   Ht 5\' 2"  (1.575 m)   Wt 113 lb (51.3 kg)   SpO2 92% Comment: RA  BMI 20.67 kg/m  Physical Exam      Exam    General- alert and comfortable    Neck- no JVD, no cervical adenopathy palpable, no carotid bruit   Lungs- clear without rales, wheezes   Cor- regular rate and rhythm, no murmur , gallop.  Well-healed sternal incision.   Abdomen- soft, non-tender   Extremities - warm, non-tender, minimal edema   Neuro- oriented, appropriate, no focal weakness   Diagnostic Tests: Results of  biopsy and report of biopsy indicating small cell carcinoma provided patient.  Impression: 1.8 cm left upper lobe nodular density found as incidental finding when she was evaluated for unstable angina and subsequent urgent CABG x4.  The nodule has hypermetabolic activity on PET scan and no associated evidence of metastatic disease although a brain MRI is pending.  Patient's PFTs show evidence of COPD but she would tolerate left upper lobectomy. Patient understands at age 85 with her COPD this would be at some increased risk but over 90% expectation that she would benefit from surgery. Plan: MRI brain followed by scheduled left VATS and left upper lobectomy on January 20 at [place] III, MD [facility] [phone]

## 2018-01-24 NOTE — Patient Instructions (Signed)
Medication Instructions:  Your physician recommends that you continue on your current medications as directed. Please refer to the Current Medication list given to you today.  If you need a refill on your cardiac medications before your next appointment, please call your pharmacy.   Lab work: none If you have labs (blood work) drawn today and your tests are completely normal, you will receive your results only by: Marland Kitchen MyChart Message (if you have MyChart) OR . A paper copy in the mail If you have any lab test that is abnormal or we need to change your treatment, we will call you to review the results.  Testing/Procedures: none  Follow-Up: At [facility], you and your health needs are our priority.  As part of our continuing mission to provide you with exceptional heart care, we have created designated Provider Care Teams.  These Care Teams include your primary Cardiologist (physician) and Advanced Practice Providers (APPs -  Physician Assistants and Nurse Practitioners) who all work together to provide you with the care you need, when you need it. You will need a follow up appointment in 6 months.  Please call our office 2 months in advance to schedule this appointment.  You may see Lauree Chandler, MD or one of the following Advanced Practice Providers on your designated Care Team:   [place], PA-C Melina Copa, PA-C . Ermalinda Barrios, PA-C  Any Other Special Instructions Will Be Listed Below (If Applicable).

## 2018-01-24 NOTE — Progress Notes (Signed)
PCP is System, Provider Not In Referring Provider is Leonie Man, MD  Chief Complaint  Patient presents with  . Lung Lesion    f/u PFTs 01/20/2018, s/p CABG x4 09/24/17    HPI:   Past Medical History:  Diagnosis Date  . AKI (acute kidney injury) ([place]) 05/16/2017  . Anxiety   . Barrett's esophagus without dysplasia   . CAD (coronary artery disease)    a. LHC 10/16/11: dLM 20%, mLAD 90%, pOM1 30%, mOM1 70% (FFR not hemodynamically significant), prox and mid RCA 30%, EF 65%;  b. PCI 10/16/11: Promus DES x 2 to mLAD  . COPD (chronic obstructive pulmonary disease) ( [place])   . Depression   . Diverticulosis   . Essential hypertension   . Family history of adverse reaction to anesthesia    mother had "breathing problems"  . GERD (gastroesophageal reflux disease)   . Hyperlipidemia   . Rectocele     Past Surgical History:  Procedure Laterality Date  . ABDOMINAL HYSTERECTOMY    . CARDIAC CATHETERIZATION    . CARDIAC CATHETERIZATION N/A 06/21/2015   Procedure: Right/Left Heart Cath and Coronary Angiography;  Surgeon: [name], MD;  Location: [place] [facility];  Service: Cardiovascular;  Laterality: N/A;  . CARDIAC CATHETERIZATION  09/16/2017  . COLONOSCOPY  01/2008   Multiple diverticula in desc and sigm colon  . CORONARY ARTERY BYPASS GRAFT N/A 09/24/2017   Procedure: CORONARY ARTERY BYPASS GRAFTING (CABG) x4 using mammary artery and saphenous vein. LIMA to LAD, SVG to RCA, SVG to RAMUS, SVT to OM. Endoscopic saphenous vein harvest.;  Surgeon: Ivin Poot, MD;  Location: [place];  Service: Open Heart Surgery;  Laterality: N/A;  . ESOPHAGOGASTRODUODENOSCOPY  01/2008   Barrett's esophagus, gastritis, no h.pylori, due follow-up 01/2011  . ESOPHAGOGASTRODUODENOSCOPY  04/06/2011   Procedure: ESOPHAGOGASTRODUODENOSCOPY (EGD);  Surgeon: Danie Binder, MD;  [facility];  Service: Endoscopy;  Laterality: N/A;  8:30  . FLEXIBLE SIGMOIDOSCOPY  08/11/2010 RECTAL PAIN/PRESSURE    SML IH, TICS  . FRACTIONAL FLOW RESERVE WIRE N/A 10/16/2011   Procedure: FRACTIONAL FLOW RESERVE WIRE;  Surgeon: [name], MD;  Location: [facility] CATH LAB;  Service: Cardiovascular;  Laterality: N/A;  . IR PERC PLEURAL DRAIN W/INDWELL CATH W/IMG GUIDE  10/01/2017  . LEFT HEART CATH AND CORONARY ANGIOGRAPHY N/A 09/16/2017   Procedure: LEFT HEART CATH AND CORONARY ANGIOGRAPHY;  Surgeon: [name], MD;  Location: [place] [facility];  Service: Cardiovascular;  Laterality: N/A;  . LEFT HEART CATHETERIZATION WITH CORONARY ANGIOGRAM N/A 03/07/2012   Procedure: LEFT HEART CATHETERIZATION WITH CORONARY ANGIOGRAM;  Surgeon: [name], MD;  Location: [facility] CATH LAB;  Service: Cardiovascular;  Laterality: N/A;  . LEFT HEART CATHETERIZATION WITH CORONARY ANGIOGRAM N/A 12/14/2012   Procedure: LEFT HEART CATHETERIZATION WITH CORONARY ANGIOGRAM;  Surgeon: [name], MD;  Location: [facility] CATH LAB;  Service: Cardiovascular;  Laterality: N/A;  . LEFT HEART CATHETERIZATION WITH CORONARY ANGIOGRAM N/A 05/07/2014   Procedure: LEFT HEART CATHETERIZATION WITH CORONARY ANGIOGRAM;  Surgeon: [name], MD;  Location: [facility] CATH LAB;  Service: Cardiovascular;  Laterality: N/A;  . PARTIAL HYSTERECTOMY  1980s  . TEE WITHOUT CARDIOVERSION N/A 09/24/2017   Procedure: TRANSESOPHAGEAL ECHOCARDIOGRAM (TEE);  Surgeon: Prescott Gum, Collier Salina, MD;  Location: [place];  Service: Open Heart Surgery;  Laterality: N/A;    Family History  Problem Relation Age of Onset  . Coronary artery disease Father 38  . Heart attack Father 26  .  GI problems Mother        Perforated colon   . Coronary artery disease Paternal Aunt   . Coronary artery disease Paternal Uncle   . Colon cancer Neg Hx   . Gastric cancer Neg Hx   . Esophageal cancer Neg Hx     Social History Social History   Tobacco Use  . Smoking status: Former Smoker    Packs/day: 1.00    Years: 53.00    Pack years: 53.00    Types:  Cigarettes    Last attempt to quit: 03/20/2007    Years since quitting: 10.8  . Smokeless tobacco: Never Used  Substance Use Topics  . Alcohol use: No  . Drug use: No    Current Outpatient Medications  Medication Sig Dispense Refill  . ALPRAZolam (XANAX) 0.25 MG tablet Take 0.25 mg by mouth 2 (two) times daily as needed for anxiety.     Marland Kitchen aspirin EC 325 MG EC tablet Take 1 tablet (325 mg total) by mouth daily. 30 tablet 0  . atorvastatin (LIPITOR) 20 MG tablet Take 1 tablet (20 mg total) by mouth at bedtime. 30 tablet 11  . lisinopril (PRINIVIL,ZESTRIL) 5 MG tablet Take 1 tablet (5 mg total) by mouth daily. 30 tablet 11  . magnesium oxide (MAG-OX) 400 (241.3 Mg) MG tablet Take 0.5 tablets (200 mg total) by mouth daily. 30 tablet 1  . memantine (NAMENDA) 10 MG tablet Take 10 mg by mouth 2 (two) times daily.     . metoprolol tartrate (LOPRESSOR) 25 MG tablet Take 0.5 tablets (12.5 mg total) by mouth 2 (two) times daily. 60 tablet 3  . nitroGLYCERIN (NITROSTAT) 0.4 MG SL tablet Place 1 tablet (0.4 mg total) under the tongue every 5 (five) minutes as needed for chest pain. 25 tablet 3  . pantoprazole (PROTONIX) 20 MG tablet Take 20 mg by mouth daily.    Marland Kitchen venlafaxine XR (EFFEXOR-XR) 37.5 MG 24 hr capsule Take 37.5 mg by mouth at bedtime.    Marland Kitchen zolpidem (AMBIEN) 10 MG tablet Take 10 mg by mouth at bedtime.     No current facility-administered medications for this visit.     Allergies  Allergen Reactions  . Adhesive [Tape] Other (See Comments)    Turns skin red  . Sulfa Antibiotics Other (See Comments)    Childhood allergy    Review of Systems  BP 140/76   Pulse 64   Resp 20   Ht 5\' 2"  (1.575 m)   Wt 113 lb (51.3 kg)   SpO2 92% Comment: RA  BMI 20.67 kg/m  Physical Exam   Diagnostic Tests:   Impression:   Plan:   Len Childs, MD [facility] [phone]

## 2018-01-24 NOTE — Progress Notes (Signed)
Chief Complaint  Patient presents with  . Follow-up    CAD   History of Present Illness: 78 yo female with history of CAD s/p CABG, HTN, HLD, COPD. Small cell lung cancer, anxiety and Barrett's esophagus who is here today for cardiac follow up. Cardiac cath in 2013 showed a moderate 70% stenosis in the first obtuse marginal branch and a 90% stenosis in the mid LAD. I treated the LAD with overlapping drug eluting stents in 2013 and treated the obtuse marginal branch with a drug eluting stent in February 2014. Cardiac cath 12/14/12 with patent stents, moderate disease. IVUS of left main and proximal LAD confirmed mild plaque disease. Repeat cath April 2016 and June 2017 with moderate non-obstructive CAD, normal filling pressures. I saw her in the office August 2019 and her chest pain had worsened. Cardiac cath August 2019 with progression of disease in her distal left main involving the ostial LAD, ostial Circumflex and ostial intermediate branch. She underwent 4V CABG on 09/24/17. (LIMA to LAD, SVG to intermediate, SVG to OM, SVG to RCA). Following her surgery she had subcutaneous emphysema with no pneumothorax. Echo 09/16/17 with ZWCH=85-27%, grade 1 diastolic dysfunction. No significant valve disease. She was found to have a solitary pulmonary nodule in the left upper lobe at the time of her surgery. By PET scanning this was hypermetabolic and felt to be early lung cancer. This was confirmed to be small cell lung cancer by biopsy in December 2019. She has been seen in the oncology office and will be seen by Dr. Prescott Gum today to discuss surgical resection of the tumor   She is here today for follow up. The patient denies any chest pain, dyspnea, palpitations, lower extremity edema, orthopnea, PND, dizziness, near syncope or syncope.   Primary Care Physician: System, Provider Not In [place], Lynelle Smoke NP ([facility] in [place])  Past Medical History:  Diagnosis Date  . AKI (acute kidney injury) ([place])  05/16/2017  . Anxiety   . Barrett's esophagus without dysplasia   . CAD (coronary artery disease)    a. LHC 10/16/11: dLM 20%, mLAD 90%, pOM1 30%, mOM1 70% (FFR not hemodynamically significant), prox and mid RCA 30%, EF 65%;  b. PCI 10/16/11: Promus DES x 2 to mLAD  . COPD (chronic obstructive pulmonary disease) ([place])   . Depression   . Diverticulosis   . Essential hypertension   . Family history of adverse reaction to anesthesia    mother had "breathing problems"  . GERD (gastroesophageal reflux disease)   . Hyperlipidemia   . Rectocele     Past Surgical History:  Procedure Laterality Date  . ABDOMINAL HYSTERECTOMY    . CARDIAC CATHETERIZATION    . CARDIAC CATHETERIZATION N/A 06/21/2015   Procedure: Right/Left Heart Cath and Coronary Angiography;  Surgeon: [name], MD;  Location: [facility];  Service: Cardiovascular;  Laterality: N/A;  . CARDIAC CATHETERIZATION  09/16/2017  . COLONOSCOPY  01/2008   Multiple diverticula in desc and sigm colon  . CORONARY ARTERY BYPASS GRAFT N/A 09/24/2017   Procedure: CORONARY ARTERY BYPASS GRAFTING (CABG) x4 using mammary artery and saphenous vein. LIMA to LAD, SVG to RCA, SVG to RAMUS, SVT to OM. Endoscopic saphenous vein harvest.;  Surgeon: Ivin Poot, MD;  Location: [facility];  Service: Open Heart Surgery;  Laterality: N/A;  . ESOPHAGOGASTRODUODENOSCOPY  01/2008   Barrett's esophagus, gastritis, no h.pylori, due follow-up 01/2011  . ESOPHAGOGASTRODUODENOSCOPY  04/06/2011   Procedure: ESOPHAGOGASTRODUODENOSCOPY (EGD);  Surgeon:  Danie Binder, MD;  [facility];  Service: Endoscopy;  Laterality: N/A;  8:30  . FLEXIBLE SIGMOIDOSCOPY  08/11/2010 RECTAL PAIN/PRESSURE   SML IH, TICS  . FRACTIONAL FLOW RESERVE WIRE N/A 10/16/2011   Procedure: FRACTIONAL FLOW RESERVE WIRE;  Surgeon: [name], MD;  Location: [facility];  Service: Cardiovascular;  Laterality: N/A;  . IR PERC PLEURAL DRAIN W/INDWELL CATH W/IMG GUIDE   10/01/2017  . LEFT HEART CATH AND CORONARY ANGIOGRAPHY N/A 09/16/2017   Procedure: LEFT HEART CATH AND CORONARY ANGIOGRAPHY;  Surgeon: [name], MD;  Location: [facility];  Service: Cardiovascular;  Laterality: N/A;  . LEFT HEART CATHETERIZATION WITH CORONARY ANGIOGRAM N/A 03/07/2012   Procedure: LEFT HEART CATHETERIZATION WITH CORONARY ANGIOGRAM;  Surgeon: [name], MD;  Location: [facility] CATH LAB;  Service: Cardiovascular;  Laterality: N/A;  . LEFT HEART CATHETERIZATION WITH CORONARY ANGIOGRAM N/A 12/14/2012   Procedure: LEFT HEART CATHETERIZATION WITH CORONARY ANGIOGRAM;  Surgeon: [name], MD;  Location: [facility] CATH LAB;  Service: Cardiovascular;  Laterality: N/A;  . LEFT HEART CATHETERIZATION WITH CORONARY ANGIOGRAM N/A 05/07/2014   Procedure: LEFT HEART CATHETERIZATION WITH CORONARY ANGIOGRAM;  Surgeon: [name], MD;  Location: [facility];  Service: Cardiovascular;  Laterality: N/A;  . PARTIAL HYSTERECTOMY  1980s  . TEE WITHOUT CARDIOVERSION N/A 09/24/2017   Procedure: TRANSESOPHAGEAL ECHOCARDIOGRAM (TEE);  Surgeon: Prescott Gum, Collier Salina, MD;  Location: [facility];  Service: Open Heart Surgery;  Laterality: N/A;    Current Outpatient Medications  Medication Sig Dispense Refill  . ALPRAZolam (XANAX) 0.25 MG tablet Take 0.25 mg by mouth 2 (two) times daily as needed for anxiety.     Marland Kitchen aspirin EC 325 MG EC tablet Take 1 tablet (325 mg total) by mouth daily. 30 tablet 0  . atorvastatin (LIPITOR) 20 MG tablet Take 1 tablet (20 mg total) by mouth at bedtime. 30 tablet 11  . lisinopril (PRINIVIL,ZESTRIL) 5 MG tablet Take 1 tablet (5 mg total) by mouth daily. 30 tablet 11  . magnesium oxide (MAG-OX) 400 (241.3 Mg) MG tablet Take 0.5 tablets (200 mg total) by mouth daily. 30 tablet 1  . memantine (NAMENDA) 10 MG tablet Take 10 mg by mouth 2 (two) times daily.     . metoprolol tartrate (LOPRESSOR) 25 MG tablet Take 0.5 tablets (12.5 mg total) by mouth 2  (two) times daily. 60 tablet 3  . nitroGLYCERIN (NITROSTAT) 0.4 MG SL tablet Place 1 tablet (0.4 mg total) under the tongue every 5 (five) minutes as needed for chest pain. 25 tablet 3  . pantoprazole (PROTONIX) 20 MG tablet Take 20 mg by mouth daily.    Marland Kitchen venlafaxine XR (EFFEXOR-XR) 37.5 MG 24 hr capsule Take 37.5 mg by mouth at bedtime.    Marland Kitchen zolpidem (AMBIEN) 10 MG tablet Take 10 mg by mouth at bedtime.     No current facility-administered medications for this visit.     Allergies  Allergen Reactions  . Adhesive [Tape] Other (See Comments)    Turns skin red  . Sulfa Antibiotics Other (See Comments)    Childhood allergy    Social History   Socioeconomic History  . Marital status: Married    Spouse name: Not on file  . Number of children: 1  . Years of education: Not on file  . Highest education level: Not on file  Occupational History  . Occupation: Retired Child psychotherapist    Comment: retired    Fish farm manager: RETIRED  Social  Needs  . Financial resource strain: Not on file  . Food insecurity:    Worry: Not on file    Inability: Not on file  . Transportation needs:    Medical: Not on file    Non-medical: Not on file  Tobacco Use  . Smoking status: Former Smoker    Packs/day: 1.00    Years: 53.00    Pack years: 53.00    Types: Cigarettes    Last attempt to quit: 03/20/2007    Years since quitting: 10.8  . Smokeless tobacco: Never Used  Substance and Sexual Activity  . Alcohol use: No  . Drug use: No  . Sexual activity: Yes  Lifestyle  . Physical activity:    Days per week: Not on file    Minutes per session: Not on file  . Stress: Not on file  Relationships  . Social connections:    Talks on phone: Not on file    Gets together: Not on file    Attends religious service: Not on file    Active member of club or organization: Not on file    Attends meetings of clubs or organizations: Not on file    Relationship status: Not on file  . Intimate partner  violence:    Fear of current or ex partner: Not on file    Emotionally abused: Not on file    Physically abused: Not on file    Forced sexual activity: Not on file  Other Topics Concern  . Not on file  Social History Narrative  . Not on file    Family History  Problem Relation Age of Onset  . Coronary artery disease Father 76  . Heart attack Father 24  . GI problems Mother        Perforated colon   . Coronary artery disease Paternal Aunt   . Coronary artery disease Paternal Uncle   . Colon cancer Neg Hx   . Gastric cancer Neg Hx   . Esophageal cancer Neg Hx     Review of Systems:  As stated in the HPI and otherwise negative.   BP (!) 164/80   Pulse (!) 58   Ht 5\' 2"  (1.575 m)   Wt 113 lb 3.2 oz (51.3 kg)   SpO2 98%   BMI 20.70 kg/m   Physical Examination: General: Well developed, well nourished, NAD  HEENT: OP clear, mucus membranes moist  SKIN: warm, dry. No rashes. Neuro: No focal deficits  Musculoskeletal: Muscle strength 5/5 all ext  Psychiatric: Mood and affect normal  Neck: No JVD, no carotid bruits, no thyromegaly, no lymphadenopathy.  Lungs:Clear bilaterally, no wheezes, rhonci, crackles Cardiovascular: Regular rate and rhythm. No murmurs, gallops or rubs. Abdomen:Soft. Bowel sounds present. Non-tender.  Etremities: No lower extremity edema. Pulses are 2 + in the bilateral DP/PT  Echo 09/16/17: Left ventricle: The cavity size was normal. Systolic function was   vigorous. The estimated ejection fraction was in the range of 65%   to 70%. Wall motion was normal; there were no regional wall   motion abnormalities. Doppler parameters are consistent with   abnormal left ventricular relaxation (grade 1 diastolic   dysfunction). There was no evidence of elevated ventricular   filling pressure by Doppler parameters. - Aortic valve: There was no regurgitation. - Aortic root: The aortic root was normal in size. - Mitral valve: Calcified annulus. Mildly thickened  leaflets .   There was trivial regurgitation. - Right ventricle: The cavity size was normal. Wall  thickness was   normal. Systolic function was normal. - Tricuspid valve: There was mild regurgitation. - Pulmonary arteries: Systolic pressure was within the normal   range. - Inferior vena cava: The vessel was normal in size. - Pericardium, extracardiac: There was no pericardial effusion.  Cardiac cath 09/16/17 1. Severe distal left main artery stenosis with hazy appearance involving the ostia of the LAD, intermediate and Circumflex arteries.  2. Severe ostial LAD stenosis. (Best seen in the LAO 43/CAU 18 view). Patent mid LAD stents.  3. Severe ostial intermediate branch stenosis 4. Severe ostial Circumflex stenosis (best seen in the LAO 49/CAU 8 view). Patent obtuse marginal stent 5. Moderately severe stenosis in the mid RCA 6. Normal LV systolic function  Recommendations: Her distal left main is hazy with severe stenosis affecting the LAD, Circumflex and large intermediate branch. She also has a moderately severe RCA stenosis. Her anatomy is not favorable for PCI/stenting. I will admit her to telemetry given her recent unstable symptoms. Will consult CT surgery to discuss CABG. She is a good candidate for CABG with normal renal function and normal LV systolic function. She has been on Plavix so this will be held today to allow for washout prior to CABG.   EKG:  EKG is ordered today. The ekg ordered today demonstrates Sinus brady, rate 58 bpm. Chronic T wave inversions inferior and anterolateral leads.   Recent Labs: 09/25/2017: Magnesium 1.7 01/13/2018: ALT 14; BUN 8; Creatinine 1.02; Hemoglobin 13.0; Platelet Count 247; Potassium 3.3; Sodium 141   Lipid Panel Followed in primary care  Wt Readings from Last 3 Encounters:  01/24/18 113 lb 3.2 oz (51.3 kg)  01/13/18 113 lb 8 oz (51.5 kg)  12/28/17 113 lb (51.3 kg)     Other studies Reviewed: Additional studies/ records that were  reviewed today include: . Review of the above records demonstrates:    Assessment and Plan:   1. CAD s/p CABG with stable angina: She underwent 4V CABG September 2019. She is doing well. No chest pain. Will continue ASA, statin and beta blocker.      2. HTN: BP has been controlled in the past. BP is elevated today. She is anxious about her appointment with Dr.Van Trigt today to discuss her lung cancer. She will have her BP checked in primary care and if still elevated, I would recommend increasing the Lisinopril to 10 mg daily.   3. HLD: Lipids followed in primary care. Will continue statin.   4. Lung cancer: Solitary left lung nodule positive for small cell carcinoma. She is being followed in the oncology office and by Dr. Prescott Gum in the CT surgery office.   Current medicines are reviewed at length with the patient today.  The patient does not have concerns regarding medicines.  The following changes have been made:  no change  Labs/ tests ordered today include:   Orders Placed This Encounter  Procedures  . EKG 12-Lead     Disposition:   FU with me after the cath.      Signed, Lauree Chandler, MD 01/24/2018 11:50 AM    [facility] [address], [place], [place]  [address] Phone: [phone]; Fax: [phone]

## 2018-01-25 ENCOUNTER — Telehealth: Payer: Self-pay | Admitting: *Deleted

## 2018-01-25 ENCOUNTER — Ambulatory Visit: Payer: Medicare Other | Admitting: Cardiothoracic Surgery

## 2018-01-25 NOTE — Telephone Encounter (Signed)
Oncology Nurse Navigator Documentation  Oncology Nurse Navigator Flowsheets 01/25/2018  Navigator Location [facility]  Navigator Encounter Type Telephone/I followed up on Rhonda Hughes's scheduled. She is set up for surgery on 02/07/18.  I called to check on her to see if she had any questions or concerns.  She states no questions at this time.  I will update Dr. Julien Nordmann she is set up for surgery.    Telephone Outgoing Call  Treatment Phase Pre-Tx/Tx Discussion  Barriers/Navigation Needs Education  Education Other  Interventions Education  Education Method Verbal  Acuity Level 2  Time Spent with Patient 15

## 2018-01-31 ENCOUNTER — Ambulatory Visit
Admission: RE | Admit: 2018-01-31 | Discharge: 2018-01-31 | Disposition: A | Discharge status: Home / Self Care | Payer: Medicare Other | Source: Ambulatory Visit | Attending: Cardiothoracic Surgery | Admitting: Cardiothoracic Surgery

## 2018-01-31 DIAGNOSIS — C3412 Malignant neoplasm of upper lobe, left bronchus or lung: Secondary | ICD-10-CM

## 2018-01-31 MED ORDER — GADOBENATE DIMEGLUMINE 529 MG/ML IV SOLN
10.0000 mL | Freq: Once | INTRAVENOUS | Status: AC | PRN
  Administered 2018-01-31: 10 mL via INTRAVENOUS

## 2018-02-03 ENCOUNTER — Encounter (HOSPITAL_COMMUNITY)
Admission: RE | Admit: 2018-02-03 | Discharge: 2018-02-03 | Disposition: A | Discharge status: Home / Self Care | Payer: Medicare Other | Source: Ambulatory Visit | Attending: Cardiothoracic Surgery | Admitting: Cardiothoracic Surgery

## 2018-02-03 ENCOUNTER — Other Ambulatory Visit: Payer: Self-pay

## 2018-02-03 ENCOUNTER — Encounter (HOSPITAL_COMMUNITY): Payer: Self-pay

## 2018-02-03 DIAGNOSIS — Z01812 Encounter for preprocedural laboratory examination: Secondary | ICD-10-CM | POA: Insufficient documentation

## 2018-02-03 DIAGNOSIS — C3412 Malignant neoplasm of upper lobe, left bronchus or lung: Secondary | ICD-10-CM

## 2018-02-03 HISTORY — DX: Malignant (primary) neoplasm, unspecified: C80.1

## 2018-02-03 LAB — CBC
HCT: 37 % (ref 36.0–46.0)
Hemoglobin: 12.2 g/dL (ref 12.0–15.0)
MCH: 31.4 pg (ref 26.0–34.0)
MCHC: 33 g/dL (ref 30.0–36.0)
MCV: 95.4 fL (ref 80.0–100.0)
Platelets: 260 10*3/uL (ref 150–400)
RBC: 3.88 MIL/uL (ref 3.87–5.11)
RDW: 15 % (ref 11.5–15.5)
WBC: 14.8 10*3/uL — ABNORMAL HIGH (ref 4.0–10.5)
nRBC: 0 % (ref 0.0–0.2)

## 2018-02-03 LAB — URINALYSIS, ROUTINE W REFLEX MICROSCOPIC
Bacteria, UA: NONE SEEN
Bilirubin Urine: NEGATIVE
Glucose, UA: NEGATIVE mg/dL
Ketones, ur: NEGATIVE mg/dL
Leukocytes, UA: NEGATIVE
Nitrite: NEGATIVE
Protein, ur: NEGATIVE mg/dL
Specific Gravity, Urine: 1.009 (ref 1.005–1.030)
pH: 5 (ref 5.0–8.0)

## 2018-02-03 LAB — COMPREHENSIVE METABOLIC PANEL
ALT: 19 U/L (ref 0–44)
AST: 24 U/L (ref 15–41)
Albumin: 3.8 g/dL (ref 3.5–5.0)
Alkaline Phosphatase: 81 U/L (ref 38–126)
Anion gap: 13 (ref 5–15)
BUN: 7 mg/dL — ABNORMAL LOW (ref 8–23)
CO2: 24 mmol/L (ref 22–32)
Calcium: 9.1 mg/dL (ref 8.9–10.3)
Chloride: 100 mmol/L (ref 98–111)
Creatinine, Ser: 0.91 mg/dL (ref 0.44–1.00)
GFR calc Af Amer: >60 mL/min (ref >60)
GFR calc non Af Amer: >60 mL/min (ref >60)
Glucose, Bld: 96 mg/dL (ref 70–99)
Potassium: 3 mmol/L — ABNORMAL LOW (ref 3.5–5.1)
Sodium: 137 mmol/L (ref 135–145)
Total Bilirubin: 0.8 mg/dL (ref 0.3–1.2)
Total Protein: 7.2 g/dL (ref 6.5–8.1)

## 2018-02-03 LAB — BLOOD GAS, ARTERIAL
Acid-Base Excess: 3.2 mmol/L — ABNORMAL HIGH (ref 0.0–2.0)
Bicarbonate: 27.2 mmol/L (ref 20.0–28.0)
Drawn by: 421801
O2 Saturation: 95.9 %
Patient temperature: 98.6
pCO2 arterial: 41.7 mmHg (ref 32.0–48.0)
pH, Arterial: 7.43 (ref 7.350–7.450)
pO2, Arterial: 81.3 mmHg — ABNORMAL LOW (ref 83.0–108.0)

## 2018-02-03 LAB — TYPE AND SCREEN
ABO/RH(D): O NEG
Antibody Screen: NEGATIVE

## 2018-02-03 LAB — SURGICAL PCR SCREEN
MRSA, PCR: NEGATIVE
Staphylococcus aureus: NEGATIVE

## 2018-02-03 LAB — PROTIME-INR
INR: 0.93
Prothrombin Time: 12.4 seconds (ref 11.4–15.2)

## 2018-02-03 LAB — APTT: aPTT: 32 seconds (ref 24–36)

## 2018-02-03 NOTE — Pre-Procedure Instructions (Addendum)
Rhonda Hughes  02/03/2018    [facility] [address] [place] [address] Phone: [phone] Fax: [phone]  [place], [place] [facility] [address] [place] [address] [place] [address] Phone: [phone] Fax: [phone]  Your procedure is scheduled on Monday, February 07, 2018   Report to [facility] at 5:30 A.M..  Call this number if you have problems the morning of surgery:  [phone]   Remember:  Do not eat or drink after midnight.    Take these medicines the morning of surgery with A SIP OF WATER   ALPRAZolam (XANAX) memantine (NAMENDA)  metoprolol tartrate (LOPRESSOR)  pantoprazole (PROTONIX)    Follow your surgeon's instructions on when to stop Asprin.  If no instructions were given by your surgeon then you will need to call the office to get those instructions.    7 days prior to surgery STOP taking any Aspirin (unless otherwise instructed by your surgeon), Aleve, Naproxen, Ibuprofen, Motrin, Advil, Goody's, BC's, all herbal medications, fish oil, and all vitamins.    Do not wear jewelry, make-up or nail polish.  Do not wear lotions, powders, or perfumes, or deodorant.  Do not shave 48 hours prior to surgery.    Do not bring valuables to the hospital.  [facility] is not responsible for any belongings or valuables.  Contacts, dentures or bridgework may not be worn into surgery.  Leave your suitcase in the car.  After surgery it may be brought to your room.  For patients admitted to the hospital, discharge time will be determined by your treatment team.  Patients discharged the day of surgery will not be allowed to drive home.   Special instructions:    [place]- Preparing For Surgery  Before surgery, you can play an important role. Because skin is not sterile, your skin needs to be as free of germs as possible. You can reduce the  number of germs on your skin by washing with CHG (chlorahexidine gluconate) Soap before surgery.  CHG is an antiseptic cleaner which kills germs and bonds with the skin to continue killing germs even after washing.    Oral Hygiene is also important to reduce your risk of infection.  Remember - BRUSH YOUR TEETH THE MORNING OF SURGERY WITH YOUR REGULAR TOOTHPASTE  Please do not use if you have an allergy to CHG or antibacterial soaps. If your skin becomes reddened/irritated stop using the CHG.  Do not shave (including legs and underarms) for at least 48 hours prior to first CHG shower. It is OK to shave your face.  Please follow these instructions carefully.   1. Shower the NIGHT BEFORE SURGERY and the MORNING OF SURGERY with CHG.   2. If you chose to wash your hair, wash your hair first as usual with your normal shampoo.  3. After you shampoo, rinse your hair and body thoroughly to remove the shampoo.  4. Use CHG as you would any other liquid soap. You can apply CHG directly to the skin and wash gently with a scrungie or a clean washcloth.   5. Apply the CHG Soap to your body ONLY FROM THE NECK DOWN.  Do not use on open wounds or open sores. Avoid contact with your eyes, ears, mouth and genitals (private parts). Wash Face and genitals (private parts)  with your normal soap.  6. Wash thoroughly, paying special attention to  the area where your surgery will be performed.  7. Thoroughly rinse your body with warm water from the neck down.  8. DO NOT shower/wash with your normal soap after using and rinsing off the CHG Soap.  9. Pat yourself dry with a CLEAN TOWEL.  10. Wear CLEAN PAJAMAS to bed the night before surgery, wear comfortable clothes the morning of surgery  11. Place CLEAN SHEETS on your bed the night of your first shower and DO NOT SLEEP WITH PETS.  Day of Surgery:  Do not apply any deodorants/lotions.  Please wear clean clothes to the hospital/surgery center.   Remember to  brush your teeth WITH YOUR REGULAR TOOTHPASTE.    Please read over the following fact sheets that you were given. Pain Booklet, Coughing and Deep Breathing, Blood Transfusion Information, MRSA Information and Surgical Site Infection Prevention

## 2018-02-03 NOTE — Progress Notes (Addendum)
PCP - [facility]  Chest x-ray - DOS EKG - 01/24/2018 Stress Test - 11/2012 ECHO - 09/16/2017 Cardiac Cath - 09/16/17  Sleep Study - denies CPAP - N/A  Blood Thinner Instructions: N/A Aspirin Instructions: LD 02/06/2018, per Thurmond Butts  Anesthesia review: YES, cardiac history  Patient denies shortness of breath, fever, cough and chest pain at PAT appointment   Patient verbalized understanding of instructions that were given to them at the PAT appointment. Patient was also instructed that they will need to review over the PAT instructions again at home before surgery.

## 2018-02-04 ENCOUNTER — Inpatient Hospital Stay (HOSPITAL_COMMUNITY): Payer: Medicare Other | Admitting: Physician Assistant

## 2018-02-04 ENCOUNTER — Telehealth: Payer: Self-pay

## 2018-02-04 ENCOUNTER — Inpatient Hospital Stay (HOSPITAL_COMMUNITY): Payer: Medicare Other | Admitting: Certified Registered Nurse Anesthetist

## 2018-02-04 NOTE — Anesthesia Preprocedure Evaluation (Signed)
Anesthesia Evaluation    Airway        Dental   Pulmonary former smoker,           Cardiovascular hypertension,      Neuro/Psych    GI/Hepatic   Endo/Other    Renal/GU      Musculoskeletal   Abdominal   Peds  Hematology   Anesthesia Other Findings   Reproductive/Obstetrics                             Anesthesia Physical Anesthesia Plan  ASA:   Anesthesia Plan:    Post-op Pain Management:    Induction:   PONV Risk Score and Plan:   Airway Management Planned:   Additional Equipment:   Intra-op Plan:   Post-operative Plan:   Informed Consent:   Plan Discussed with:   Anesthesia Plan Comments: (See PAT note by Karoline Caldwell, PA-C )        Anesthesia Quick Evaluation

## 2018-02-04 NOTE — Telephone Encounter (Signed)
Rhonda Hughes contacted the office to state that she has developed a cough and is congested.  She stated that she is to have surgery with Dr. Prescott Gum on Monday.  Patient advised as long as she does not develop a fever she can proceed to the hospital on Monday.  Patient advised to contact the office if her symptoms change or develops a fever.

## 2018-02-04 NOTE — Progress Notes (Signed)
Anesthesia Chart Review:  Case:  [id number] Date/Time:  02/07/18 0715   Procedure:  VIDEO ASSISTED THORACOSCOPY (VATS)/ LOBECTOMY (Left Chest)   Anesthesia type:  General   Pre-op diagnosis:  LUL LUNG CANCER   Location:  [facility] OR ROOM 17 / [facility]   Surgeon:  Ivin Poot, MD      DISCUSSION: 78 yo female former smoker. Pertinent hx includes CAD s/p CABG, HTN, HLD, COPD. Small cell lung cancer, anxiety and Barrett's esophagus.  Cardiac cath August 2019 with progression of disease in her distal left main involving the ostial LAD, ostial Circumflex and ostial intermediate branch. She underwent 4V CABG on 09/24/17. Echo 09/16/17 with LDJT=70-17%, grade 1 diastolic dysfunction. No significant valve disease. She was found to have a solitary pulmonary nodule in the left upper lobe at the time of her surgery. By PET scanning this was hypermetabolic and felt to be early lung cancer. This was confirmed to be small cell lung cancer by biopsy in December 2019.  Last OV note by Dr. Angelena Form 01/24/2018 states the pt doing well s/p CABG, "The patient denies any chest pain, dyspnea, palpitations, lower extremity edema, orthopnea, PND, dizziness, near syncope or syncope."  Anticipate she can proceed as planned barring acute status change.  VS: BP (!) 121/50   Pulse 64   Temp 36.8 C   Ht 5' 1.5" (1.562 m)   Wt 51.2 kg   SpO2 96%   BMI 20.97 kg/m   PROVIDERS: Etter Sjogren, NP ([facility] in [place])  Lauree Chandler, MD is Cardiologist  LABS: Labs reviewed: Acceptable for surgery. (all labs ordered are listed, but only abnormal results are displayed)  Labs Reviewed  BLOOD GAS, ARTERIAL - Abnormal; Notable for the following components:      Result Value   pO2, Arterial 81.3 (*)    Acid-Base Excess 3.2 (*)    All other components within normal limits  CBC - Abnormal; Notable for the following components:   WBC 14.8 (*)    All other components within normal limits  COMPREHENSIVE METABOLIC  PANEL - Abnormal; Notable for the following components:   Potassium 3.0 (*)    BUN 7 (*)    All other components within normal limits  URINALYSIS, ROUTINE W REFLEX MICROSCOPIC - Abnormal; Notable for the following components:   Color, Urine STRAW (*)    Hgb urine dipstick SMALL (*)    All other components within normal limits  SURGICAL PCR SCREEN  APTT  PROTIME-INR  TYPE AND SCREEN     IMAGES: NM PET imaging 12/24/2017: IMPRESSION: 1. Known left upper lobe pulmonary nodule is markedly hypermetabolic consistent with malignancy. 2. No evidence for hypermetabolic lymphadenopathy in the left hilum or mediastinum. No distant hypermetabolic disease in the neck, abdomen, or pelvis. 3. 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 4.  Emphysema. (ICD10-J43.9) 5.  Aortic Atherosclerois (ICD10-170.0)  EKG: 01/24/2018: Sinus bradycardia. T wave abn, consider inferolateral ischemia. Rate 58.  CV: Carotid US 09/17/2017: Final Interpretation: Right Carotid: Velocities in the right ICA are consistent with a 1-39% stenosis.  Left Carotid: Velocities in the left ICA are consistent with a 1-39% stenosis.  Right ABI: Pedal artery waveforms within normal limits. Left ABI: Pedal artery waveforms within normal limits.  Echo 09/16/17: Left ventricle: The cavity size was normal. Systolic function was vigorous. The estimated ejection fraction was in the range of 65% to 70%. Wall motion was normal; there were no regional wall motion abnormalities. Doppler parameters  are consistent with abnormal left ventricular relaxation (grade 1 diastolic dysfunction). There was no evidence of elevated ventricular filling pressure by Doppler parameters. - Aortic valve: There was no regurgitation. - Aortic root: The aortic root was normal in size. - Mitral valve: Calcified annulus. Mildly thickened leaflets . There was trivial regurgitation. - Right ventricle: The  cavity size was normal. Wall thickness was normal. Systolic function was normal. - Tricuspid valve: There was mild regurgitation. - Pulmonary arteries: Systolic pressure was within the normal range. - Inferior vena cava: The vessel was normal in size. - Pericardium, extracardiac: There was no pericardial effusion.  Cardiac cath 09/16/17 (had CABG 09/24/17) 1. Severe distal left main artery stenosis with hazy appearance involving the ostia of the LAD, intermediate and Circumflex arteries.  2. Severe ostial LAD stenosis. (Best seen in the LAO 43/CAU 18 view). Patent mid LAD stents.  3. Severe ostial intermediate branch stenosis 4. Severe ostial Circumflex stenosis (best seen in the LAO 49/CAU 8 view). Patent obtuse marginal stent 5. Moderately severe stenosis in the mid RCA 6. Normal LV systolic function  Recommendations: Her distal left main is hazy with severe stenosis affecting the LAD, Circumflex and large intermediate branch. She also has a moderately severe RCA stenosis. Her anatomy is not favorable for PCI/stenting. I will admit her to telemetry given her recent unstable symptoms. Will consult CT surgery to discuss CABG. She is a good candidate for CABG with normal renal function and normal LV systolic function. She has been on Plavix so this will be held today to allow for washout prior to CABG.   Past Medical History:  Diagnosis Date  . AKI (acute kidney injury) ([place]) 05/16/2017  . Anxiety   . Barrett's esophagus without dysplasia   . CAD (coronary artery disease)    a. LHC 10/16/11: dLM 20%, mLAD 90%, pOM1 30%, mOM1 70% (FFR not hemodynamically significant), prox and mid RCA 30%, EF 65%;  b. PCI 10/16/11: Promus DES x 2 to mLAD  . Cancer ([place])    left upper lobe  . COPD (chronic obstructive pulmonary disease) ([place])   . Depression   . Diverticulosis   . Essential hypertension   . Family history of adverse reaction to anesthesia    mother had "breathing problems"  . GERD  (gastroesophageal reflux disease)   . Hyperlipidemia   . Rectocele     Past Surgical History:  Procedure Laterality Date  . ABDOMINAL HYSTERECTOMY    . CARDIAC CATHETERIZATION    . CARDIAC CATHETERIZATION N/A 06/21/2015   Procedure: Right/Left Heart Cath and Coronary Angiography;  Surgeon: [name], MD;  Location: [facility] CV LAB;  Service: Cardiovascular;  Laterality: N/A;  . CARDIAC CATHETERIZATION  09/16/2017  . CATARACT EXTRACTION W/ INTRAOCULAR LENS  IMPLANT, BILATERAL Bilateral   . COLONOSCOPY  01/2008   Multiple diverticula in desc and sigm colon  . CORONARY ARTERY BYPASS GRAFT N/A 09/24/2017   Procedure: CORONARY ARTERY BYPASS GRAFTING (CABG) x4 using mammary artery and saphenous vein. LIMA to LAD, SVG to RCA, SVG to RAMUS, SVT to OM. Endoscopic saphenous vein harvest.;  Surgeon: Ivin Poot, MD;  Location: [facility];  Service: Open Heart Surgery;  Laterality: N/A;  . ESOPHAGOGASTRODUODENOSCOPY  01/2008   Barrett's esophagus, gastritis, no h.pylori, due follow-up 01/2011  . ESOPHAGOGASTRODUODENOSCOPY  04/06/2011   Procedure: ESOPHAGOGASTRODUODENOSCOPY (EGD);  Surgeon: Danie Binder, MD;  Location[facility];  Service: Endoscopy;  Laterality: N/A;  8:30  . FLEXIBLE SIGMOIDOSCOPY  08/11/2010 RECTAL  PAIN/PRESSURE   SML IH, TICS  . FRACTIONAL FLOW RESERVE WIRE N/A 10/16/2011   Procedure: FRACTIONAL FLOW RESERVE WIRE;  Surgeon: [name], MD;  Location: [facility] CATH LAB;  Service: Cardiovascular;  Laterality: N/A;  . IR PERC PLEURAL DRAIN W/INDWELL CATH W/IMG GUIDE  10/01/2017  . LEFT HEART CATH AND CORONARY ANGIOGRAPHY N/A 09/16/2017   Procedure: LEFT HEART CATH AND CORONARY ANGIOGRAPHY;  Surgeon: [name], MD;  Location: [facility] CV LAB;  Service: Cardiovascular;  Laterality: N/A;  . LEFT HEART CATHETERIZATION WITH CORONARY ANGIOGRAM N/A 03/07/2012   Procedure: LEFT HEART CATHETERIZATION WITH CORONARY ANGIOGRAM;  Surgeon: [name],  MD;  Location: [facility] CATH LAB;  Service: Cardiovascular;  Laterality: N/A;  . LEFT HEART CATHETERIZATION WITH CORONARY ANGIOGRAM N/A 12/14/2012   Procedure: LEFT HEART CATHETERIZATION WITH CORONARY ANGIOGRAM;  Surgeon: [name], MD;  Location: [facility] CATH LAB;  Service: Cardiovascular;  Laterality: N/A;  . LEFT HEART CATHETERIZATION WITH CORONARY ANGIOGRAM N/A 05/07/2014   Procedure: LEFT HEART CATHETERIZATION WITH CORONARY ANGIOGRAM;  Surgeon: [name], MD;  Location: [facility] CATH LAB;  Service: Cardiovascular;  Laterality: N/A;  . PARTIAL HYSTERECTOMY  1980s  . TEE WITHOUT CARDIOVERSION N/A 09/24/2017   Procedure: TRANSESOPHAGEAL ECHOCARDIOGRAM (TEE);  Surgeon: Prescott Gum, Collier Salina, MD;  Location: [place];  Service: Open Heart Surgery;  Laterality: N/A;    MEDICATIONS: . ALPRAZolam (XANAX) 0.25 MG tablet  . aspirin EC 325 MG EC tablet  . atorvastatin (LIPITOR) 20 MG tablet  . lisinopril (PRINIVIL,ZESTRIL) 5 MG tablet  . magnesium oxide (MAG-OX) 400 (241.3 Mg) MG tablet  . memantine (NAMENDA) 10 MG tablet  . metoprolol tartrate (LOPRESSOR) 25 MG tablet  . nitroGLYCERIN (NITROSTAT) 0.4 MG SL tablet  . pantoprazole (PROTONIX) 20 MG tablet  . venlafaxine XR (EFFEXOR-XR) 37.5 MG 24 hr capsule  . zolpidem (AMBIEN) 10 MG tablet   No current facility-administered medications for this encounter.      Wynonia Musty [facility] Short Stay Center/Anesthesiology Phone [phone] 02/04/2018 10:12 AM

## 2018-02-07 ENCOUNTER — Encounter (HOSPITAL_COMMUNITY): Payer: Self-pay

## 2018-02-07 ENCOUNTER — Observation Stay (HOSPITAL_COMMUNITY)
Admission: RE | Admit: 2018-02-07 | Discharge: 2018-02-08 | Disposition: A | Discharge status: Home / Self Care | Payer: Medicare Other | Attending: Cardiothoracic Surgery | Admitting: Cardiothoracic Surgery

## 2018-02-07 ENCOUNTER — Inpatient Hospital Stay (HOSPITAL_COMMUNITY): Payer: Medicare Other

## 2018-02-07 ENCOUNTER — Other Ambulatory Visit: Payer: Self-pay

## 2018-02-07 ENCOUNTER — Encounter (HOSPITAL_COMMUNITY)
Admission: RE | Disposition: A | Discharge status: Home / Self Care | Payer: Self-pay | Source: Home / Self Care | Attending: Cardiothoracic Surgery

## 2018-02-07 DIAGNOSIS — Z01818 Encounter for other preprocedural examination: Secondary | ICD-10-CM

## 2018-02-07 DIAGNOSIS — J439 Emphysema, unspecified: Secondary | ICD-10-CM | POA: Diagnosis not present

## 2018-02-07 DIAGNOSIS — Z5309 Procedure and treatment not carried out because of other contraindication: Secondary | ICD-10-CM | POA: Insufficient documentation

## 2018-02-07 DIAGNOSIS — C3412 Malignant neoplasm of upper lobe, left bronchus or lung: Secondary | ICD-10-CM | POA: Diagnosis not present

## 2018-02-07 DIAGNOSIS — Z87891 Personal history of nicotine dependence: Secondary | ICD-10-CM | POA: Insufficient documentation

## 2018-02-07 DIAGNOSIS — Z79899 Other long term (current) drug therapy: Secondary | ICD-10-CM | POA: Insufficient documentation

## 2018-02-07 DIAGNOSIS — I2510 Atherosclerotic heart disease of native coronary artery without angina pectoris: Secondary | ICD-10-CM | POA: Diagnosis not present

## 2018-02-07 DIAGNOSIS — R5381 Other malaise: Secondary | ICD-10-CM | POA: Insufficient documentation

## 2018-02-07 DIAGNOSIS — E785 Hyperlipidemia, unspecified: Secondary | ICD-10-CM | POA: Insufficient documentation

## 2018-02-07 DIAGNOSIS — F329 Major depressive disorder, single episode, unspecified: Secondary | ICD-10-CM | POA: Diagnosis not present

## 2018-02-07 DIAGNOSIS — Z882 Allergy status to sulfonamides status: Secondary | ICD-10-CM | POA: Diagnosis not present

## 2018-02-07 DIAGNOSIS — Z951 Presence of aortocoronary bypass graft: Secondary | ICD-10-CM | POA: Diagnosis not present

## 2018-02-07 DIAGNOSIS — I10 Essential (primary) hypertension: Secondary | ICD-10-CM | POA: Insufficient documentation

## 2018-02-07 DIAGNOSIS — F419 Anxiety disorder, unspecified: Secondary | ICD-10-CM | POA: Insufficient documentation

## 2018-02-07 DIAGNOSIS — D72829 Elevated white blood cell count, unspecified: Secondary | ICD-10-CM | POA: Insufficient documentation

## 2018-02-07 DIAGNOSIS — R05 Cough: Secondary | ICD-10-CM | POA: Diagnosis not present

## 2018-02-07 DIAGNOSIS — Z955 Presence of coronary angioplasty implant and graft: Secondary | ICD-10-CM | POA: Diagnosis not present

## 2018-02-07 DIAGNOSIS — C3490 Malignant neoplasm of unspecified part of unspecified bronchus or lung: Secondary | ICD-10-CM | POA: Diagnosis present

## 2018-02-07 DIAGNOSIS — R0981 Nasal congestion: Secondary | ICD-10-CM | POA: Diagnosis not present

## 2018-02-07 DIAGNOSIS — K219 Gastro-esophageal reflux disease without esophagitis: Secondary | ICD-10-CM | POA: Diagnosis not present

## 2018-02-07 DIAGNOSIS — J069 Acute upper respiratory infection, unspecified: Secondary | ICD-10-CM | POA: Diagnosis not present

## 2018-02-07 DIAGNOSIS — Z7982 Long term (current) use of aspirin: Secondary | ICD-10-CM | POA: Diagnosis not present

## 2018-02-07 DIAGNOSIS — C801 Malignant (primary) neoplasm, unspecified: Secondary | ICD-10-CM | POA: Diagnosis present

## 2018-02-07 LAB — CREATININE, SERUM
Creatinine, Ser: 0.84 mg/dL (ref 0.44–1.00)
GFR calc Af Amer: >60 mL/min (ref >60)
GFR calc non Af Amer: >60 mL/min (ref >60)

## 2018-02-07 LAB — INFLUENZA PANEL BY PCR (TYPE A & B)
Influenza A By PCR: NEGATIVE
Influenza B By PCR: NEGATIVE

## 2018-02-07 LAB — CBC
HCT: 35.5 % — ABNORMAL LOW (ref 36.0–46.0)
Hemoglobin: 11.6 g/dL — ABNORMAL LOW (ref 12.0–15.0)
MCH: 31.6 pg (ref 26.0–34.0)
MCHC: 32.7 g/dL (ref 30.0–36.0)
MCV: 96.7 fL (ref 80.0–100.0)
Platelets: 165 10*3/uL (ref 150–400)
RBC: 3.67 MIL/uL — ABNORMAL LOW (ref 3.87–5.11)
RDW: 15.1 % (ref 11.5–15.5)
WBC: 11.1 10*3/uL — ABNORMAL HIGH (ref 4.0–10.5)
nRBC: 0 % (ref 0.0–0.2)

## 2018-02-07 SURGERY — VIDEO ASSISTED THORACOSCOPY (VATS)/ LOBECTOMY
Anesthesia: General | Site: Chest | Laterality: Left

## 2018-02-07 MED ORDER — SODIUM CHLORIDE 0.9% FLUSH
3.0000 mL | Freq: Two times a day (BID) | INTRAVENOUS | Status: DC
  Administered 2018-02-07 (×2): 3 mL via INTRAVENOUS

## 2018-02-07 MED ORDER — ZOLPIDEM TARTRATE 5 MG PO TABS: 5.0000 mg | ORAL_TABLET | Freq: Every evening | ORAL | Status: DC | PRN

## 2018-02-07 MED ORDER — ONDANSETRON HCL 4 MG/2ML IJ SOLN
INTRAMUSCULAR | Status: AC
  Filled 2018-02-07: qty 2

## 2018-02-07 MED ORDER — VENLAFAXINE HCL ER 37.5 MG PO CP24: 37.5000 mg | ORAL_CAPSULE | Freq: Every day | ORAL | Status: DC

## 2018-02-07 MED ORDER — ROCURONIUM BROMIDE 50 MG/5ML IV SOSY
PREFILLED_SYRINGE | INTRAVENOUS | Status: AC
  Filled 2018-02-07: qty 5

## 2018-02-07 MED ORDER — 0.9 % SODIUM CHLORIDE (POUR BTL) OPTIME
TOPICAL | Status: DC | PRN
  Administered 2018-02-07 (×2): 1000 mL

## 2018-02-07 MED ORDER — DEXAMETHASONE SODIUM PHOSPHATE 10 MG/ML IJ SOLN
INTRAMUSCULAR | Status: AC
  Filled 2018-02-07: qty 1

## 2018-02-07 MED ORDER — ACETAMINOPHEN 325 MG PO TABS: 650.0000 mg | ORAL_TABLET | Freq: Four times a day (QID) | ORAL | Status: DC | PRN

## 2018-02-07 MED ORDER — ENOXAPARIN SODIUM 30 MG/0.3ML ~~LOC~~ SOLN
30.0000 mg | SUBCUTANEOUS | Status: DC
  Administered 2018-02-07: 30 mg via SUBCUTANEOUS
  Filled 2018-02-07: qty 0.3

## 2018-02-07 MED ORDER — MIDAZOLAM HCL 5 MG/5ML IJ SOLN
INTRAMUSCULAR | Status: AC | PRN
  Administered 2018-02-07: 2 mg via INTRAVENOUS

## 2018-02-07 MED ORDER — GUAIFENESIN-DM 100-10 MG/5ML PO SYRP
5.0000 mL | ORAL_SOLUTION | ORAL | Status: DC | PRN
  Administered 2018-02-07: 5 mL via ORAL
  Filled 2018-02-07: qty 5

## 2018-02-07 MED ORDER — CEFAZOLIN SODIUM-DEXTROSE 2-4 GM/100ML-% IV SOLN
2.0000 g | INTRAVENOUS | Status: DC
  Filled 2018-02-07: qty 100

## 2018-02-07 MED ORDER — FENTANYL CITRATE (PF) 250 MCG/5ML IJ SOLN
INTRAMUSCULAR | Status: AC
  Filled 2018-02-07: qty 5

## 2018-02-07 MED ORDER — ASPIRIN EC 81 MG PO TBEC
81.0000 mg | DELAYED_RELEASE_TABLET | Freq: Every day | ORAL | Status: DC
  Administered 2018-02-07 – 2018-02-08 (×2): 81 mg via ORAL
  Filled 2018-02-07 (×2): qty 1

## 2018-02-07 MED ORDER — PROPOFOL 10 MG/ML IV BOLUS
INTRAVENOUS | Status: AC
  Filled 2018-02-07: qty 40

## 2018-02-07 MED ORDER — HYDROCOD POLST-CPM POLST ER 10-8 MG/5ML PO SUER
5.0000 mL | Freq: Four times a day (QID) | ORAL | Status: DC
  Administered 2018-02-07 – 2018-02-08 (×3): 5 mL via ORAL
  Filled 2018-02-07 (×3): qty 5

## 2018-02-07 MED ORDER — ACETAMINOPHEN 650 MG RE SUPP: 650.0000 mg | Freq: Four times a day (QID) | RECTAL | Status: DC | PRN

## 2018-02-07 MED ORDER — LIDOCAINE 2% (20 MG/ML) 5 ML SYRINGE
INTRAMUSCULAR | Status: AC
  Filled 2018-02-07: qty 5

## 2018-02-07 MED ORDER — SODIUM CHLORIDE 0.9% FLUSH: 3.0000 mL | INTRAVENOUS | Status: DC | PRN

## 2018-02-07 MED ORDER — PANTOPRAZOLE SODIUM 40 MG PO TBEC: 40.0000 mg | DELAYED_RELEASE_TABLET | Freq: Every day | ORAL | Status: DC

## 2018-02-07 MED ORDER — POLYETHYLENE GLYCOL 3350 17 G PO PACK: 17.0000 g | PACK | Freq: Every day | ORAL | Status: DC | PRN

## 2018-02-07 MED ORDER — MEMANTINE HCL 10 MG PO TABS: 10.0000 mg | ORAL_TABLET | Freq: Two times a day (BID) | ORAL | Status: DC

## 2018-02-07 MED ORDER — METOPROLOL TARTRATE 12.5 MG HALF TABLET: 12.5000 mg | ORAL_TABLET | Freq: Two times a day (BID) | ORAL | Status: DC

## 2018-02-07 MED ORDER — LISINOPRIL 10 MG PO TABS: 5.0000 mg | ORAL_TABLET | Freq: Every day | ORAL | Status: DC

## 2018-02-07 MED ORDER — SODIUM CHLORIDE 0.9 % IV SOLN: 1.0000 g | INTRAVENOUS | Status: DC

## 2018-02-07 MED ORDER — MIDAZOLAM HCL 2 MG/2ML IJ SOLN
INTRAMUSCULAR | Status: AC
  Filled 2018-02-07: qty 2

## 2018-02-07 MED ORDER — SODIUM CHLORIDE 0.9 % IV SOLN: 250.0000 mL | INTRAVENOUS | Status: DC | PRN

## 2018-02-07 MED ORDER — METHYLPREDNISOLONE SODIUM SUCC 125 MG IJ SOLR: 40.0000 mg | Freq: Two times a day (BID) | INTRAMUSCULAR | Status: DC

## 2018-02-07 MED ORDER — ATORVASTATIN CALCIUM 20 MG PO TABS: 20.0000 mg | ORAL_TABLET | Freq: Every day | ORAL | Status: DC

## 2018-02-07 NOTE — Plan of Care (Signed)

## 2018-02-07 NOTE — Progress Notes (Signed)
CT surgery admission note  Patient presented to short stay for left VATS/pulmonary resection of left upper lobe biopsy-proven cancer.  While being prepared for surgery the patient had constant coughing and symptoms of upper airway congestion.  The patient relates she has had a strong cough for the past several days with nasal and airway secretions and some fatigue.  On exam she had scattered rhonchi in the right lung and her white count is elevated at 14.8k--previous white count 2 weeks ago 9.5k.  The patient has borderline pulmonary function at baseline and with evidence of acute on chronic bronchitis and possible viral syndrome I have decided to cancel her surgery, bring the patient into the hospital for 24-hour observation and then reschedule surgery when her pulmonary function is optimized.  We will check a flu screen.

## 2018-02-07 NOTE — Progress Notes (Signed)
Droplet precautions discontinued per infection prevention.

## 2018-02-07 NOTE — Anesthesia Procedure Notes (Signed)
Central Venous Catheter Insertion Performed by: Nolon Nations, MD, anesthesiologist Start/End1/20/2020 6:55 AM, 02/07/2018 7:05 AM Patient location: Pre-op. Preanesthetic checklist: patient identified, IV checked, site marked, risks and benefits discussed, surgical consent, monitors and equipment checked, pre-op evaluation, timeout performed and anesthesia consent Lidocaine 1% used for infiltration and patient sedated Hand hygiene performed  and maximum sterile barriers used  Catheter size: 8 Fr Total catheter length 16. Central line was placed.Double lumen Procedure performed using ultrasound guided technique. Ultrasound Notes:anatomy identified, needle tip was noted to be adjacent to the nerve/plexus identified, no ultrasound evidence of intravascular and/or intraneural injection and image(s) printed for medical record Attempts: 1 Following insertion, dressing applied, line sutured and Biopatch. Post procedure assessment: blood return through all ports, free fluid flow and no air  Patient tolerated the procedure well with no immediate complications.

## 2018-02-07 NOTE — Progress Notes (Signed)
Pre Procedure note for inpatients:   Rhonda Hughes has been scheduled for Procedure(s): VIDEO ASSISTED THORACOSCOPY (VATS)/ LOBECTOMY (Left) today. The various methods of treatment have been discussed with the patient. After consideration of the risks, benefits and treatment options the patient has consented to the planned procedure.   The patient has been seen and labs reviewed. There are no changes in the patient's condition to prevent proceeding with the planned procedure today.  Recent labs:  Lab Results  Component Value Date   WBC 14.8 (H) 02/03/2018   HGB 12.2 02/03/2018   HCT 37.0 02/03/2018   PLT 260 02/03/2018   GLUCOSE 96 02/03/2018   ALT 19 02/03/2018   AST 24 02/03/2018   NA 137 02/03/2018   K 3.0 (L) 02/03/2018   CL 100 02/03/2018   CREATININE 0.91 02/03/2018   BUN 7 (L) 02/03/2018   CO2 24 02/03/2018   TSH 0.467 04/24/2015   INR 0.93 02/03/2018   HGBA1C 5.8 (H) 09/17/2017    Len Childs, MD 02/07/2018 7:06 AM

## 2018-02-07 NOTE — H&P (Signed)
[address]       [place] [address]             [phone]        Kameela M Iannello [facility] Medical Record #[id number] Date of Birth: Apr 04, 1940  Referring: No ref. 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  She presented today for VATS/resection of the left upper lobe malignancy. I first examined the patient while she was getting IV placement by the anesthesia team.  She had incessant coughing.  After completion of the IV placement I examined the patient.  She had flushing of her face with nasal congestion.  She had a wet cough productive of white sputum.  She had scattered rhonchi at the right base.  Her preoperative CBC showed white count elevated at 14.8k with her previous recent CBC demonstrating white count of 9.5k.  Preoperative chest x-ray showed the nodule in the left upper lobe with changes of COPD.  Because the patient's COPD is fairly severe with FEV1 1.3 and DLCO 52% predicted I decided to postpone the surgery and admit the patient for 24-hour observation.  She will obtain a influenza screen and be started on antibiotics and a prednisone taper.  The patient understood the reasoning for canceling the surgery.  Current Activity/ Functional Status: Patient lives at home with her husband remains fairly active   Zubrod Score: At the time of surgery this patient's most appropriate activity status/level should be described as: []     0    Normal activity, no symptoms []     1    Restricted in physical strenuous activity but ambulatory,  able to do out light work [x]     2    Ambulatory and capable of self care, unable to do work activities, up and about                 more than 50%  Of the time                            []     3    Only limited self care, in bed greater than 50% of waking hours []     4    Completely disabled, no self care, confined to bed or chair []     5    Moribund  Past Medical History:  Diagnosis Date  . AKI (acute kidney injury) ([place]) 05/16/2017  . Anxiety   . Barrett's esophagus without dysplasia   . CAD (coronary artery disease)    a. LHC 10/16/11: dLM 20%, mLAD 90%, pOM1 30%, mOM1 70% (FFR not hemodynamically significant), prox and mid RCA 30%, EF 65%;  b. PCI 10/16/11: Promus DES x 2 to mLAD  . Cancer ([place])    left upper lobe  . COPD (chronic obstructive pulmonary disease) ([place])   . Depression   . Diverticulosis   . Essential hypertension   .  Family history of adverse reaction to anesthesia    mother had "breathing problems"  . GERD (gastroesophageal reflux disease)   . Hyperlipidemia   . Rectocele     Past Surgical History:  Procedure Laterality Date  . ABDOMINAL HYSTERECTOMY    . CARDIAC CATHETERIZATION    . CARDIAC CATHETERIZATION N/A 06/21/2015   Procedure: Right/Left Heart Cath and Coronary Angiography;  Surgeon: [name], MD;  Location: [place] [facility];  Service: Cardiovascular;  Laterality: N/A;  . CARDIAC CATHETERIZATION  09/16/2017  . CATARACT EXTRACTION W/ INTRAOCULAR LENS  IMPLANT, BILATERAL Bilateral   . COLONOSCOPY  01/2008   Multiple diverticula in desc and sigm colon  . CORONARY ARTERY BYPASS GRAFT N/A 09/24/2017   Procedure: CORONARY ARTERY BYPASS GRAFTING (CABG) x4 using mammary artery and saphenous vein. LIMA to LAD, SVG to RCA, SVG to RAMUS, SVT to OM. Endoscopic saphenous vein harvest.;  Surgeon: Ivin Poot, MD;  Location: [facility];  Service: Open Heart Surgery;  Laterality: N/A;  . ESOPHAGOGASTRODUODENOSCOPY  01/2008   Barrett's esophagus, gastritis, no  h.pylori, due follow-up 01/2011  . ESOPHAGOGASTRODUODENOSCOPY  04/06/2011   Procedure: ESOPHAGOGASTRODUODENOSCOPY (EGD);  Surgeon: Danie Binder, MD;  [facility];  Service: Endoscopy;  Laterality: N/A;  8:30  . FLEXIBLE SIGMOIDOSCOPY  08/11/2010 RECTAL PAIN/PRESSURE   SML IH, TICS  . FRACTIONAL FLOW RESERVE WIRE N/A 10/16/2011   Procedure: FRACTIONAL FLOW RESERVE WIRE;  Surgeon: [name], MD;  Location: [facility] CATH LAB;  Service: Cardiovascular;  Laterality: N/A;  . IR PERC PLEURAL DRAIN W/INDWELL CATH W/IMG GUIDE  10/01/2017  . LEFT HEART CATH AND CORONARY ANGIOGRAPHY N/A 09/16/2017   Procedure: LEFT HEART CATH AND CORONARY ANGIOGRAPHY;  Surgeon: [name], MD;  Location: [place] [facility];  Service: Cardiovascular;  Laterality: N/A;  . LEFT HEART CATHETERIZATION WITH CORONARY ANGIOGRAM N/A 03/07/2012   Procedure: LEFT HEART CATHETERIZATION WITH CORONARY ANGIOGRAM;  Surgeon: [name], MD;  Location: [facility] CATH LAB;  Service: Cardiovascular;  Laterality: N/A;  . LEFT HEART CATHETERIZATION WITH CORONARY ANGIOGRAM N/A 12/14/2012   Procedure: LEFT HEART CATHETERIZATION WITH CORONARY ANGIOGRAM;  Surgeon: [name], MD;  Location: [facility] CATH LAB;  Service: Cardiovascular;  Laterality: N/A;  . LEFT HEART CATHETERIZATION WITH CORONARY ANGIOGRAM N/A 05/07/2014   Procedure: LEFT HEART CATHETERIZATION WITH CORONARY ANGIOGRAM;  Surgeon: [name], MD;  Location: [facility] CATH LAB;  Service: Cardiovascular;  Laterality: N/A;  . PARTIAL HYSTERECTOMY  1980s  . TEE WITHOUT CARDIOVERSION N/A 09/24/2017   Procedure: TRANSESOPHAGEAL ECHOCARDIOGRAM (TEE);  Surgeon: Prescott Gum, Collier Salina, MD;  Location: [place];  Service: Open Heart Surgery;  Laterality: N/A;    Social History   Tobacco Use  Smoking Status Former Smoker  . Packs/day: 1.00  . Years: 53.00  . Pack years: 53.00  . Types: Cigarettes  . Last attempt to quit: 03/20/2007  . Years since quitting:  10.8  Smokeless Tobacco Never Used    Social History   Substance and Sexual Activity  Alcohol Use No     Allergies  Allergen Reactions  . Gadolinium Derivatives Hives and Itching    Pt began having hives and itching about 5-7 minutes after contrast administered. Dr Weber [place]s evaluated pt and had Katie RN administer 50 mg Benedryl by IV. Dr Weber [place]s cleared pt to go home.  . Adhesive [Tape] Other (See Comments)    Turns skin red  . Sulfa Antibiotics Other (See Comments)    Childhood allergy  Current Facility-Administered Medications  Medication Dose Route Frequency Provider Last Rate Last Dose  . 0.9 %  sodium chloride infusion  250 mL Intravenous PRN Prescott Gum, Collier Salina, MD      . acetaminophen (TYLENOL) tablet 650 mg  650 mg Oral Q6H PRN Prescott Gum, Collier Salina, MD       Or  . acetaminophen (TYLENOL) suppository 650 mg  650 mg Rectal Q6H PRN Ivin Poot, MD      . aspirin EC tablet 81 mg  81 mg Oral Daily Prescott Gum, Collier Salina, MD   81 mg at 02/07/18 1100  . enoxaparin (LOVENOX) injection 30 mg  30 mg Subcutaneous Q24H Prescott Gum, Collier Salina, MD   30 mg at 02/07/18 1100  . guaiFENesin-dextromethorphan (ROBITUSSIN DM) 100-10 MG/5ML syrup 5 mL  5 mL Oral Q4H PRN Prescott Gum, Collier Salina, MD      . polyethylene glycol [facility] / GLYCOLAX) packet 17 g  17 g Oral Daily PRN Prescott Gum, Collier Salina, MD      . sodium chloride flush (NS) 0.9 % injection 3 mL  3 mL Intravenous Q12H Prescott Gum, Collier Salina, MD   3 mL at 02/07/18 1102  . sodium chloride flush (NS) 0.9 % injection 3 mL  3 mL Intravenous PRN Ivin Poot, MD       Facility-Administered Medications Ordered in Other Encounters  Medication Dose Route Frequency Provider Last Rate Last Dose  . midazolam (VERSED) 5 MG/5ML injection    Anesthesia Intra-op Dongell, Tyrone Nine, CRNA   2 mg at 02/07/18 0715    Medications Prior to Admission  Medication Sig Dispense Refill Last Dose  . ALPRAZolam (XANAX) 0.25 MG tablet Take 0.25 mg by mouth 2 (two) times daily as  needed for anxiety.    02/06/2018 at Unknown time  . atorvastatin (LIPITOR) 20 MG tablet Take 1 tablet (20 mg total) by mouth at bedtime. 30 tablet 11 02/06/2018 at Unknown time  . lisinopril (PRINIVIL,ZESTRIL) 5 MG tablet Take 1 tablet (5 mg total) by mouth daily. 30 tablet 11 02/06/2018 at Unknown time  . magnesium oxide (MAG-OX) 400 (241.3 Mg) MG tablet TAKE 1/2 (ONE-HALF) TABLET BY MOUTH ONCE DAILY 45 tablet 3 Past Week at Unknown time  . memantine (NAMENDA) 10 MG tablet Take 10 mg by mouth 2 (two) times daily.    02/06/2018 at Unknown time  . metoprolol tartrate (LOPRESSOR) 25 MG tablet Take 0.5 tablets (12.5 mg total) by mouth 2 (two) times daily. 60 tablet 3 02/07/2018 at 0330  . pantoprazole (PROTONIX) 20 MG tablet Take 20 mg by mouth daily.   02/07/2018 at 0330  . venlafaxine XR (EFFEXOR-XR) 37.5 MG 24 hr capsule Take 37.5 mg by mouth at bedtime.   02/06/2018 at Unknown time  . zolpidem (AMBIEN) 10 MG tablet Take 10 mg by mouth at bedtime.   02/06/2018 at Unknown time  . aspirin EC 325 MG EC tablet Take 1 tablet (325 mg total) by mouth daily. 30 tablet 0 02/06/2018  . nitroGLYCERIN (NITROSTAT) 0.4 MG SL tablet Place 1 tablet (0.4 mg total) under the tongue every 5 (five) minutes as needed for chest pain. 25 tablet 3 Unknown at Unknown time    Family History  Problem Relation Age of Onset  . Coronary artery disease Father 18  . Heart attack Father 72  . GI problems Mother        Perforated colon   . Coronary artery disease Paternal Aunt   . Coronary artery disease Paternal Uncle   .  Colon cancer Neg Hx   . Gastric cancer Neg Hx   . Esophageal cancer Neg Hx      Review of Systems:   ROS      Cardiac Review of Systems: Y or  [    ]= no  Chest Pain [    ]  Resting SOB [   ] Exertional SOB  [ y ]  Orthopnea [  ]   Pedal Edema [   ]    Palpitations [  ] Syncope  [  ]   Presyncope [   ]  General Review of Systems: [Y] = yes [  ]=no Constitional: recent weight change [  ]; anorexia [  ];  fatigue Rhonda Hughes  ]; nausea [  ]; night sweats [  ]; fever [  ]; or chills [  ]                                                               Dental: Last Dentist visit: 1 year  Eye : blurred vision [  ]; diplopia [   ]; vision changes [  ];  Amaurosis fugax[  ]; Resp: cough Rhonda Hughes  ];  wheezing[  ];  hemoptysis[  ]; shortness of breath[ y ]; paroxysmal nocturnal dyspnea[  ]; dyspnea on exertion[  ]; or orthopnea[  ];  GI:  gallstones[  ], vomiting[  ];  dysphagia[  ]; melena[  ];  hematochezia [  ]; heartburn[  ];   Hx of  Colonoscopy[  ]; GU: kidney stones [  ]; hematuria[  ];   dysuria [  ];  nocturia[  ];  history of     obstruction [  ]; urinary frequency [  ]             Skin: rash, swelling[  ];, hair loss[  ];  peripheral edema[  ];  or itching[  ]; Musculosketetal: myalgias[  ];  joint swelling[  ];  joint erythema[  ];  joint pain[  ];  back pain[  ];  Heme/Lymph: bruising[  ];  bleeding[  ];  anemia[  ];  Neuro: TIA[  ];  headaches[  ];  stroke[  ];  vertigo[  ];  seizures[  ];   paresthesias[  ];  difficulty walking[  ];  Psych:depression[  ]; anxiety[  ];  Endocrine: diabetes[  ];  thyroid dysfunction[  ];               Physical Exam: BP 115/66   Pulse 67   Temp 98.4 F (36.9 C) (Oral)   Resp 19   Ht 5' 1.5" (1.562 m)   Wt 51.2 kg   SpO2 99%   BMI 20.97 kg/m         Exam    General- alert and comfortable    Neck- no JVD, no cervical adenopathy palpable, no carotid bruit   Lungs-right lung base with scattered rhonchi left lung clear   Cor- regular rate and rhythm, no murmur , gallop   Abdomen- soft, non-tender   Extremities - warm, non-tender, minimal edema   Neuro- oriented, appropriate, no focal weakness   Diagnostic Studies & Laboratory data:     Recent Radiology Findings:   Dg Chest 2 View  Result  Date: 02/07/2018 CLINICAL DATA:  Initial preoperative evaluation, left upper lobe malignancy. EXAM: CHEST - 2 VIEW COMPARISON:  Prior CT from 01/07/2018 FINDINGS: Median  sternotomy wires underlying CABG markers and surgical clips noted. Heart size within normal limits. Mediastinal silhouette normal. Aortic atherosclerosis. Lungs hyperinflated with associated emphysematous changes. No focal infiltrates, edema, or effusion. No pneumothorax. Approximate 2 cm left upper lobe nodule, grossly stable from previous. No acute osseous finding. IMPRESSION: 1. Grossly stable left upper lobe pulmonary nodule, compatible with known malignancy. 2. Emphysema.  No other active cardiopulmonary disease. Electronically Signed   By: Jeannine Boga M.D.   On: 02/07/2018 06:23     I have independently reviewed the above radiologic studies and discussed with the patient   Preop lab work  WBC 14.8 Hematocrit 37% Sodium 137 Creatinine 0.91 Urinalysis clear Nasal swab-negative for MRSA   Assessment / Plan:   The patient will be admitted for 24-hour observation to rule out influenza and to initiate therapy.  She will probably be discharged tomorrow, follow-up in the office for later rescheduling of her surgery when her pulmonary status improves.        02/07/2018 11:27 AM

## 2018-02-07 NOTE — Progress Notes (Signed)
Patient arrived to the unit, VSS, CCMD verified x2, skin assessment completed by 2 RN's, maintained droplet precautions per orders. Will continue to assess and monitor.

## 2018-02-07 NOTE — Progress Notes (Signed)
Report given to [place], RN 4E rm 10. AAOX4, pt aroused by voice. Family at the bedside. IVF infusing w/o pain or swelling. Will transport pt to the floor. The opportunity to ask questions was provided.

## 2018-02-07 NOTE — Progress Notes (Signed)
VAST RN called and spoke with unit RN, Amedeo Gory. She is aware of discontinue central line order and will have it completed as soon as possible.

## 2018-02-07 NOTE — Anesthesia Procedure Notes (Signed)
Arterial Line Insertion Start/End1/20/2020 6:45 AM, 02/07/2018 7:00 AM Performed by: CRNA  Patient location: Pre-op. Preanesthetic checklist: patient identified, IV checked, site marked, risks and benefits discussed, surgical consent, monitors and equipment checked, pre-op evaluation and anesthesia consent Lidocaine 1% used for infiltration and patient sedated Left, radial was placed Catheter size: 20 G Hand hygiene performed , maximum sterile barriers used  and Seldinger technique used Allen's test indicative of satisfactory collateral circulation Attempts: 1 Procedure performed without using ultrasound guided technique. Following insertion, dressing applied. Post procedure assessment: normal

## 2018-02-08 ENCOUNTER — Encounter: Payer: Self-pay | Admitting: Gastroenterology

## 2018-02-08 DIAGNOSIS — C3412 Malignant neoplasm of upper lobe, left bronchus or lung: Secondary | ICD-10-CM | POA: Diagnosis not present

## 2018-02-08 MED ORDER — ASPIRIN 81 MG PO TBEC: 81.0000 mg | DELAYED_RELEASE_TABLET | Freq: Every day | ORAL | Status: DC

## 2018-02-08 MED ORDER — PREDNISONE 5 MG PO TABS: ORAL_TABLET | ORAL | 0 refills | Status: DC

## 2018-02-08 MED ORDER — ACETAMINOPHEN 325 MG PO TABS: 650.0000 mg | ORAL_TABLET | Freq: Four times a day (QID) | ORAL | Status: AC | PRN

## 2018-02-08 MED ORDER — PREDNISONE 20 MG PO TABS: 20.0000 mg | ORAL_TABLET | Freq: Every day | ORAL | 0 refills | Status: AC

## 2018-02-08 MED ORDER — HYDROCOD POLST-CPM POLST ER 10-8 MG/5ML PO SUER: 5.0000 mL | Freq: Two times a day (BID) | ORAL | 0 refills | Status: AC | PRN

## 2018-02-08 MED ORDER — GUAIFENESIN-DM 100-10 MG/5ML PO SYRP: 5.0000 mL | ORAL_SOLUTION | ORAL | 0 refills | Status: DC | PRN

## 2018-02-08 MED ORDER — AZITHROMYCIN 250 MG PO TABS: ORAL_TABLET | ORAL | 0 refills | Status: AC

## 2018-02-08 NOTE — Progress Notes (Addendum)
      [address]       [place] [address]             [phone]      1 Day Post-Op Procedure(s) (LRB): VIDEO ASSISTED THORACOSCOPY (VATS)/ LOBECTOMY (Left) Subjective: Feels okay this morning. Still has a lingering cough  Objective: Vital signs in last 24 hours: Temp:  [97.5 F (36.4 C)-98.6 F (37 C)] 98.2 F (36.8 C) (01/21 0522) Pulse Rate:  [62-77] 76 (01/21 0451) Cardiac Rhythm: Other (Comment) (01/21 0518) Resp:  [12-24] 24 (01/21 0451) BP: (115-163)/(62-75) 163/73 (01/21 0451) SpO2:  [94 %-99 %] 95 % (01/21 0451)      Intake/Output from previous day: 01/20 0701 - 01/21 0700 In: 620 [P.O.:620] Out: 3003 [Urine:3002; Stool:1] Intake/Output this shift: No intake/output data recorded.  General appearance: alert, cooperative and no distress Heart: regular rate and rhythm, S1, S2 normal, no murmur, click, rub or gallop Lungs: clear to auscultation bilaterally Abdomen: soft, non-tender; bowel sounds normal; no masses,  no organomegaly Extremities: extremities normal, atraumatic, no cyanosis or edema Wound: n/a  Lab Results: Recent Labs    02/07/18 0726  WBC 11.1*  HGB 11.6*  HCT 35.5*  PLT 165   BMET:  Recent Labs    02/07/18 0726  CREATININE 0.84    PT/INR: No results for input(s): LABPROT, INR in the last 72 hours. ABG    Component Value Date/Time   PHART 7.430 02/03/2018 1438   HCO3 27.2 02/03/2018 1438   TCO2 27 09/25/2017 1724   ACIDBASEDEF 1.0 06/21/2015 1259   O2SAT 95.9 02/03/2018 1438   CBG (last 3)  No results for input(s): GLUCAP in the last 72 hours.  Assessment/Plan: S/P Procedure(s) (LRB): VIDEO ASSISTED THORACOSCOPY (VATS)/ LOBECTOMY (Left)   1. CV-5am HR was in the 120s-140s with coughing. NSR in the 90s this morning. BP high at times but not on her metoprolol or lisinopril.  2. Pulm-tolerating room air with good oxygen saturation. CXR yesterday was stable 3. Renal-creatinine 0.84, electrolytes okay 4.  Blood glucose is well controlled 5. WBC is 11.1, low-grade fever tmax 99.1, influenza negative.    Plan: Discharge this morning. She will need to follow-up in the office to schedule surgery when she feels better. She already has an appointment scheduled.     LOS: 1 day    Rhonda Hughes 02/08/2018   patient examined and medical record reviewed,agree with above note. Will followuop patient in the offic e [name] 02/08/2018

## 2018-02-08 NOTE — Discharge Summary (Signed)
[address]       [place] [address]             [phone]      Physician Discharge Summary  Patient ID: Rhonda Hughes MRN: [id number] DOB/AGE: May 21, 1940 78 y.o.  Admit date: 02/07/2018 Discharge date: 02/08/2018  Admission Diagnoses: Patient Active Problem List   Diagnosis Date Noted  . Lung cancer ([place]) 02/07/2018  . Goals of care, counseling/discussion 01/13/2018  . 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  . Chest pain with moderate risk for cardiac etiology 09/17/2017  . Lung nodule 09/17/2017  . Liver nodule 09/17/2017  . Unstable angina ([place]) 09/16/2017  . Benign essential HTN   . AKI (acute kidney injury) ([place]) 05/16/2017  . Dehydration symptoms 05/16/2017  . Acute kidney injury ([place]) 05/16/2017  . Constipation 05/12/2017  . Coronary artery disease involving native coronary artery of native heart with unstable angina pectoris ([place])   . Esophageal reflux   . CAD (coronary artery disease), native coronary artery 04/24/2015  . Hyponatremia 04/24/2015  . Pain in the chest   . Exertional angina ([place]) 03/08/2012  . Crescendo angina ([place]) 03/08/2012  . Hypokalemia 03/08/2012  . HTN (hypertension)   . Chest pain, atypical 10/13/2011  . At risk for coronary artery disease 10/13/2011  . 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 Barrett's esophagus 02/25/2011  . Rectal pressure 06/18/2010  . 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  She presented today for VATS/resection of the left upper lobe malignancy. I first examined the patient while she was getting IV placement by the anesthesia team.  She had incessant coughing.  After completion of the  IV placement I examined the patient.  She had flushing of her face with nasal congestion.  She had a wet cough productive of white sputum.  She had scattered rhonchi at the right base.  Her preoperative CBC showed white count elevated at 14.8k with her previous recent CBC demonstrating white count of 9.5k.  Preoperative chest x-ray showed the nodule in the left upper lobe with changes of COPD.  Because the patient's COPD is fairly severe with FEV1 1.3 and DLCO 52% predicted I decided to postpone the surgery and admit the patient for 24-hour observation.  She will obtain a influenza screen and be started on antibiotics and a prednisone taper.  The patient understood the reasoning for canceling the surgery.   Hospital Course:  She presented for surgery but it was canceled due to the above reasons. She was placed in 24 hour observation. She did have an episode of coughing in which her heart rate increased to 120s-140s. It recovered after she was done coughing. She has been NSR in the 90s. She will be started back on her metoprolol and lisinopril which she was taking at home. She will be placed on steroids and a z-pack foe th next 5 days. She is influenza negative. She will follow-up with our office on 02/17/2018 so we may re-schedule her surgery.   Consults: None  Significant Diagnostic Studies:  CLINICAL DATA:  Initial preoperative evaluation, left upper lobe malignancy.  EXAM: CHEST - 2 VIEW  COMPARISON:  Prior  CT from 01/07/2018  FINDINGS: Median sternotomy wires underlying CABG markers and surgical clips noted. Heart size within normal limits. Mediastinal silhouette normal. Aortic atherosclerosis.  Lungs hyperinflated with associated emphysematous changes. No focal infiltrates, edema, or effusion. No pneumothorax. Approximate 2 cm left upper lobe nodule, grossly stable from previous.  No acute osseous finding.  IMPRESSION: 1. Grossly stable left upper lobe pulmonary nodule,  compatible with known malignancy. 2. Emphysema.  No other active cardiopulmonary disease.   Electronically Signed   By: Jeannine Boga M.D.   On: 02/07/2018 06:23  Treatments: Medical treatment.  Discharge Exam: Blood pressure (!) 137/110, pulse 84, temperature 99.1 F (37.3 C), temperature source Oral, resp. rate 13, height 5' 1.5" (1.562 m), weight 51.2 kg, SpO2 94 %.    General appearance: alert, cooperative and no distress Heart: regular rate and rhythm, S1, S2 normal, no murmur, click, rub or gallop Lungs: clear to auscultation bilaterally Abdomen: soft, non-tender; bowel sounds normal; no masses,  no organomegaly Extremities: extremities normal, atraumatic, no cyanosis or edema Wound: n/a   Disposition: Discharge disposition: 01-Home or Self Care        Allergies as of 02/08/2018      Reactions   Gadolinium Derivatives Hives, Itching   Pt began having hives and itching about 5-7 minutes after contrast administered. Dr Weber Cooks evaluated pt and had Katie RN administer 50 mg Benedryl by IV. Dr Weber Cooks cleared pt to go home.   Adhesive [tape] Other (See Comments)   Turns skin red   Sulfa Antibiotics Other (See Comments)   Childhood allergy      Medication List    STOP taking these medications   zolpidem 10 MG tablet Commonly known as:  AMBIEN     TAKE these medications   acetaminophen 325 MG tablet Commonly known as:  TYLENOL Take 2 tablets (650 mg total) by mouth every 6 (six) hours as needed for mild pain (or Fever >/= 101). Notes to patient:  Did not receive during hospitalization   ALPRAZolam 0.25 MG tablet Commonly known as:  XANAX Take 0.25 mg by mouth 2 (two) times daily as needed for anxiety.   aspirin 81 MG EC tablet Take 1 tablet (81 mg total) by mouth daily. What changed:    medication strength  how much to take   atorvastatin 20 MG tablet Commonly known as:  LIPITOR Take 1 tablet (20 mg total) by mouth at bedtime.     azithromycin 250 MG tablet Commonly known as:  ZITHROMAX Z-PAK Take 2 tablets (500 mg) on  Day 1,  followed by 1 tablet (250 mg) once daily on Days 2 through 5.   chlorpheniramine-HYDROcodone 10-8 MG/5ML Suer Commonly known as:  TUSSIONEX Take 5 mLs by mouth every 12 (twelve) hours as needed for up to 3 days for cough.   guaiFENesin-dextromethorphan 100-10 MG/5ML syrup Commonly known as:  ROBITUSSIN DM Take 5 mLs by mouth every 4 (four) hours as needed for cough.   lisinopril 5 MG tablet Commonly known as:  PRINIVIL,ZESTRIL Take 1 tablet (5 mg total) by mouth daily. Notes to patient:  Did not receive during hospitalization   magnesium oxide 400 (241.3 Mg) MG tablet Commonly known as:  MAG-OX TAKE 1/2 (ONE-HALF) TABLET BY MOUTH ONCE DAILY Notes to patient:  Did not receive during hospitalization   memantine 10 MG tablet Commonly known as:  NAMENDA Take 10 mg by mouth 2 (two) times daily. Notes to patient:  Did not receive during hospitalization   metoprolol tartrate  25 MG tablet Commonly known as:  LOPRESSOR Take 0.5 tablets (12.5 mg total) by mouth 2 (two) times daily. Notes to patient:  Did not receive during hospitalization   nitroGLYCERIN 0.4 MG SL tablet Commonly known as:  NITROSTAT Place 1 tablet (0.4 mg total) under the tongue every 5 (five) minutes as needed for chest pain. Notes to patient:  Did not receive during hospitalization   pantoprazole 20 MG tablet Commonly known as:  PROTONIX Take 20 mg by mouth daily. Notes to patient:  Did not receive during hospitalization     predniSONE 5 MG tablet Commonly known as:  DELTASONE Take 4 tabs (20mg ) day 1, then 3 tabs (15mg ) day 2 , then 2 tabs (10mg ) day 3, then 1 tab (5mg ) day 4, and take 0.5 tab (2.5mg ) day 5.   venlafaxine XR 37.5 MG 24 hr capsule Commonly known as:  EFFEXOR-XR Take 37.5 mg by mouth at bedtime. Notes to patient:  Did not receive during hospitalization      Follow-up Information    Ivin Poot, MD Follow up.   Specialty:  Cardiothoracic Surgery Why:  Your appointment is on 02/17/2018 at 4:30pm.  Contact information: [address] [place] [address] [phone]        [name], MD. Call in 1 day(s).   Specialty:  Cardiology Contact information: [address] [place] [address] [phone]           Signed: Elgie Collard 02/08/2018, 8:11 AM

## 2018-02-08 NOTE — Progress Notes (Signed)
Patient is discharged per MD orders, VSS. All discharge education and instructions provided to the patient and patient's husband, with verbal understanding of all follow up care. Patient will be transported home by husband will all personal belongings.

## 2018-02-08 NOTE — Progress Notes (Signed)
With coughing H.R. going up 120-140 S.T. with trig. PVC's Sats 97% C/o sore throat

## 2018-02-08 NOTE — Plan of Care (Signed)

## 2018-02-08 NOTE — Discharge Instructions (Signed)
Take all medications as prescribed.   °

## 2018-02-15 ENCOUNTER — Other Ambulatory Visit: Payer: Self-pay | Admitting: Cardiothoracic Surgery

## 2018-02-15 DIAGNOSIS — C3490 Malignant neoplasm of unspecified part of unspecified bronchus or lung: Secondary | ICD-10-CM

## 2018-02-17 ENCOUNTER — Ambulatory Visit: Payer: Medicare Other | Admitting: Cardiothoracic Surgery

## 2018-02-23 ENCOUNTER — Ambulatory Visit (INDEPENDENT_AMBULATORY_CARE_PROVIDER_SITE_OTHER): Payer: Medicare Other | Admitting: Cardiology

## 2018-02-23 ENCOUNTER — Encounter: Payer: Self-pay | Admitting: Cardiology

## 2018-02-23 ENCOUNTER — Other Ambulatory Visit: Payer: Self-pay | Admitting: *Deleted

## 2018-02-23 ENCOUNTER — Ambulatory Visit (INDEPENDENT_AMBULATORY_CARE_PROVIDER_SITE_OTHER): Payer: Medicare Other | Admitting: Cardiothoracic Surgery

## 2018-02-23 ENCOUNTER — Encounter: Payer: Self-pay | Admitting: Cardiothoracic Surgery

## 2018-02-23 ENCOUNTER — Encounter: Payer: Self-pay | Admitting: *Deleted

## 2018-02-23 ENCOUNTER — Ambulatory Visit
Admission: RE | Admit: 2018-02-23 | Discharge: 2018-02-23 | Disposition: A | Discharge status: Home / Self Care | Payer: Medicare Other | Source: Ambulatory Visit | Attending: Cardiothoracic Surgery | Admitting: Cardiothoracic Surgery

## 2018-02-23 VITALS — BP 120/76 | HR 68 | Ht 61.5 in | Wt 114.8 lb

## 2018-02-23 VITALS — BP 106/58 | HR 80 | Resp 20 | Ht 61.5 in | Wt 114.0 lb

## 2018-02-23 DIAGNOSIS — Z951 Presence of aortocoronary bypass graft: Secondary | ICD-10-CM

## 2018-02-23 DIAGNOSIS — C3412 Malignant neoplasm of upper lobe, left bronchus or lung: Secondary | ICD-10-CM

## 2018-02-23 DIAGNOSIS — I2510 Atherosclerotic heart disease of native coronary artery without angina pectoris: Secondary | ICD-10-CM

## 2018-02-23 DIAGNOSIS — C3490 Malignant neoplasm of unspecified part of unspecified bronchus or lung: Secondary | ICD-10-CM

## 2018-02-23 NOTE — Progress Notes (Signed)
02/23/2018 [place]   April 28, 1940  [id number]  Primary Physician System, Provider Not In Primary Cardiologist: Lauree Chandler, MD  Electrophysiologist: None   Reason for Visit/CC: f/u for CAD  HPI:  Rhonda Hughes is a 78 y.o. female, followed by Dr. Angelena Form, presenting to clinic today for evaluation for CAD.  She has undergone coronary stenting in the past and most recently underwent coronary artery bypass surgery in September 2019 by Dr. Prescott Gum.  Echocardiogram at that time showed normal left ventricular EF.  She was also recently found to have left upper lobe small cell carcinoma and was referred back to Dr. Prescott Gum for VATS/resection.  This was initially scheduled for February 07, 2018 however patient developed an acute upper respiratory infection and Dr. Prescott Gum elected to postpone the procedure until infection cleared.  Prior to her anticipated VATS procedure, she had been seen by Dr. Angelena Form and was doing well however Dr. Angelena Form had recommended that she follow-up post VATS procedure.  She was placed on my schedule and is here today.  She reports that her upper respiratory infection is getting better.  She has follow-up with Dr. Prescott Gum later this afternoon and she is hoping that he will proceed with surgery.  From a cardiac standpoint, she reports that she has done well.  She has not had any chest pain.  No significant dyspnea beyond her baseline.  She reports full medication compliance.  Heart rate and blood pressure both well controlled.   Cardiac Studies   Current Meds  Medication Sig  . acetaminophen (TYLENOL) 325 MG tablet Take 2 tablets (650 mg total) by mouth every 6 (six) hours as needed for mild pain (or Fever >/= 101).  Marland Kitchen ALPRAZolam (XANAX) 0.25 MG tablet Take 0.25 mg by mouth 2 (two) times daily as needed for anxiety.   Marland Kitchen aspirin 325 MG tablet Take 325 mg by mouth daily.  Marland Kitchen atorvastatin (LIPITOR) 20 MG tablet Take 1 tablet (20 mg total) by mouth at  bedtime.  Marland Kitchen lisinopril (PRINIVIL,ZESTRIL) 5 MG tablet Take 1 tablet (5 mg total) by mouth daily.  . magnesium oxide (MAG-OX) 400 (241.3 Mg) MG tablet TAKE 1/2 (ONE-HALF) TABLET BY MOUTH ONCE DAILY  . memantine (NAMENDA) 10 MG tablet Take 10 mg by mouth 2 (two) times daily.   . metoprolol tartrate (LOPRESSOR) 25 MG tablet Take 0.5 tablets (12.5 mg total) by mouth 2 (two) times daily.  . nitroGLYCERIN (NITROSTAT) 0.4 MG SL tablet Place 1 tablet (0.4 mg total) under the tongue every 5 (five) minutes as needed for chest pain.  . pantoprazole (PROTONIX) 20 MG tablet Take 20 mg by mouth daily.  . predniSONE (DELTASONE) 5 MG tablet Take 4 tabs (20mg ) day 1, then 3 tabs (15mg ) day 2 , then 2 tabs (10mg ) day 3, then 1 tab (5mg ) day 4, and take 0.5 tab (2.5mg ) day 5.  . venlafaxine XR (EFFEXOR-XR) 37.5 MG 24 hr capsule Take 37.5 mg by mouth at bedtime.  Marland Kitchen zolpidem (AMBIEN) 10 MG tablet Take 10 mg by mouth at bedtime as needed for sleep.   Allergies  Allergen Reactions  . Gadolinium Derivatives Hives and Itching    Pt began having hives and itching about 5-7 minutes after contrast administered. Dr Weber Cooks evaluated pt and had Katie RN administer 50 mg Benedryl by IV. Dr Weber Cooks cleared pt to go home.  . Adhesive [Tape] Other (See Comments)    Turns skin red  . Sulfa Antibiotics Other (See Comments)  Childhood allergy   Past Medical History:  Diagnosis Date  . AKI (acute kidney injury) ([place]) 05/16/2017  . Anxiety   . Barrett's esophagus without dysplasia   . CAD (coronary artery disease)    a. LHC 10/16/11: dLM 20%, mLAD 90%, pOM1 30%, mOM1 70% (FFR not hemodynamically significant), prox and mid RCA 30%, EF 65%;  b. PCI 10/16/11: Promus DES x 2 to mLAD  . Cancer ([place])    left upper lobe  . COPD (chronic obstructive pulmonary disease) ([place])   . Depression   . Diverticulosis   . Essential hypertension   . Family history of adverse reaction to anesthesia    mother had "breathing problems"  .  GERD (gastroesophageal reflux disease)   . Hyperlipidemia   . Rectocele    Family History  Problem Relation Age of Onset  . Coronary artery disease Father 60  . Heart attack Father 48  . GI problems Mother        Perforated colon   . Coronary artery disease Paternal Aunt   . Coronary artery disease Paternal Uncle   . Colon cancer Neg Hx   . Gastric cancer Neg Hx   . Esophageal cancer Neg Hx    Past Surgical History:  Procedure Laterality Date  . ABDOMINAL HYSTERECTOMY    . CARDIAC CATHETERIZATION    . CARDIAC CATHETERIZATION N/A 06/21/2015   Procedure: Right/Left Heart Cath and Coronary Angiography;  Surgeon: [name], MD;  Location: [facility];  Service: Cardiovascular;  Laterality: N/A;  . CARDIAC CATHETERIZATION  09/16/2017  . CATARACT EXTRACTION W/ INTRAOCULAR LENS  IMPLANT, BILATERAL Bilateral   . COLONOSCOPY  01/2008   Multiple diverticula in desc and sigm colon  . CORONARY ARTERY BYPASS GRAFT N/A 09/24/2017   Procedure: CORONARY ARTERY BYPASS GRAFTING (CABG) x4 using mammary artery and saphenous vein. LIMA to LAD, SVG to RCA, SVG to RAMUS, SVT to OM. Endoscopic saphenous vein harvest.;  Surgeon: Ivin Poot, MD;  Location: [facility];  Service: Open Heart Surgery;  Laterality: N/A;  . ESOPHAGOGASTRODUODENOSCOPY  01/2008   Barrett's esophagus, gastritis, no h.pylori, due follow-up 01/2011  . ESOPHAGOGASTRODUODENOSCOPY  04/06/2011   Procedure: ESOPHAGOGASTRODUODENOSCOPY (EGD);  Surgeon: Danie Binder, MD;  [facility];  Service: Endoscopy;  Laterality: N/A;  8:30  . FLEXIBLE SIGMOIDOSCOPY  08/11/2010 RECTAL PAIN/PRESSURE   SML IH, TICS  . FRACTIONAL FLOW RESERVE WIRE N/A 10/16/2011   Procedure: FRACTIONAL FLOW RESERVE WIRE;  Surgeon: [name], MD;  Location: [facility] C[facility];  Service: Cardiovascular;  Laterality: N/A;  . IR PERC PLEURAL DRAIN W/INDWELL CATH W/IMG GUIDE  10/01/2017  . LEFT HEART CATH AND CORONARY ANGIOGRAPHY N/A 09/16/2017    Procedure: LEFT HEART CATH AND CORONARY ANGIOGRAPHY;  Surgeon: [name], MD;  Location: [facility];  Service: Cardiovascular;  Laterality: N/A;  . LEFT HEART CATHETERIZATION WITH CORONARY ANGIOGRAM N/A 03/07/2012   Procedure: LEFT HEART CATHETERIZATION WITH CORONARY ANGIOGRAM;  Surgeon: [name], MD;  Location: [facility] CATH LAB;  Service: Cardiovascular;  Laterality: N/A;  . LEFT HEART CATHETERIZATION WITH CORONARY ANGIOGRAM N/A 12/14/2012   Procedure: LEFT HEART CATHETERIZATION WITH CORONARY ANGIOGRAM;  Surgeon: [name], MD;  Location: [facility] CATH LAB;  Service: Cardiovascular;  Laterality: N/A;  . LEFT HEART CATHETERIZATION WITH CORONARY ANGIOGRAM N/A 05/07/2014   Procedure: LEFT HEART CATHETERIZATION WITH CORONARY ANGIOGRAM;  Surgeon: [name], MD;  Location: [facility] CATH LAB;  Service: Cardiovascular;  Laterality: N/A;  .  PARTIAL HYSTERECTOMY  1980s  . TEE WITHOUT CARDIOVERSION N/A 09/24/2017   Procedure: TRANSESOPHAGEAL ECHOCARDIOGRAM (TEE);  Surgeon: Prescott Gum, Collier Salina, MD;  Location: [facility];  Service: Open Heart Surgery;  Laterality: N/A;   Social History   Socioeconomic History  . Marital status: Married    Spouse name: Not on file  . Number of children: 1  . Years of education: Not on file  . Highest education level: Not on file  Occupational History  . Occupation: Retired Child psychotherapist    Comment: retired    Fish farm manager: RETIRED  Social Needs  . Financial resource strain: Not on file  . Food insecurity:    Worry: Not on file    Inability: Not on file  . Transportation needs:    Medical: Not on file    Non-medical: Not on file  Tobacco Use  . Smoking status: Former Smoker    Packs/day: 1.00    Years: 53.00    Pack years: 53.00    Types: Cigarettes    Last attempt to quit: 03/20/2007    Years since quitting: 10.9  . Smokeless tobacco: Never Used  Substance and Sexual Activity  . Alcohol use: No  . Drug use: No  .  Sexual activity: Yes  Lifestyle  . Physical activity:    Days per week: Not on file    Minutes per session: Not on file  . Stress: Not on file  Relationships  . Social connections:    Talks on phone: Not on file    Gets together: Not on file    Attends religious service: Not on file    Active member of club or organization: Not on file    Attends meetings of clubs or organizations: Not on file    Relationship status: Not on file  . Intimate partner violence:    Fear of current or ex partner: Not on file    Emotionally abused: Not on file    Physically abused: Not on file    Forced sexual activity: Not on file  Other Topics Concern  . Not on file  Social History Narrative  . Not on file     Review of Systems: General: negative for chills, fever, night sweats or weight changes.  Cardiovascular: negative for chest pain, dyspnea on exertion, edema, orthopnea, palpitations, paroxysmal nocturnal dyspnea or shortness of breath Dermatological: negative for rash Respiratory: negative for cough or wheezing Urologic: negative for hematuria Abdominal: negative for nausea, vomiting, diarrhea, bright red blood per rectum, melena, or hematemesis Neurologic: negative for visual changes, syncope, or dizziness All other systems reviewed and are otherwise negative except as noted above.   Physical Exam:  Blood pressure 120/76, pulse 68, height 5' 1.5" (1.562 m), weight 114 lb 12.8 oz (52.1 kg), SpO2 98 %.  General appearance: alert, cooperative and no distress Neck: no carotid bruit and no JVD Lungs: clear to auscultation bilaterally Heart: regular rate and rhythm, S1, S2 normal, no murmur, click, rub or gallop Extremities: extremities normal, atraumatic, no cyanosis or edema Skin: Skin color, texture, turgor normal. No rashes or lesions Neurologic: Grossly normal  EKG not performed today -- personally reviewed   ASSESSMENT AND PLAN:   1. CAD: s/p recent CABG x 4 09/2017. Stable w/o  angina. Continue medical therapy.   2. Lung Cancer: plan for VATS/resection per Dr. Prescott Gum.   3. URI: improved per pt report. Still with slight cough.   4. HTN: controlled on current regimen.   5. HLD: on  statin. Lipids followed by PCP. LDL goal < 70 mg/dL.  Follow-Up w/ Dr. Angelena Form in 6 months.    Rhonda Hughes, MHS [facility] 02/23/2018 12:59 PM

## 2018-02-23 NOTE — Patient Instructions (Signed)
Medication Instructions:  NONE If you need a refill on your cardiac medications before your next appointment, please call your pharmacy.   Lab work: NONE If you have labs (blood work) drawn today and your tests are completely normal, you will receive your results only by: Marland Kitchen MyChart Message (if you have MyChart) OR . A paper copy in the mail If you have any lab test that is abnormal or we need to change your treatment, we will call you to review the results.  Testing/Procedures: NONE  Follow-Up: At [facility], you and your health needs are our priority.  As part of our continuing mission to provide you with exceptional heart care, we have created designated Provider Care Teams.  These Care Teams include your primary Cardiologist (physician) and Advanced Practice Providers (APPs -  Physician Assistants and Nurse Practitioners) who all work together to provide you with the care you need, when you need it. You will need a follow up appointment in 6 months.  Please call our office 2 months in advance to schedule this appointment.  You may see Lauree Chandler, MD or one of the following Advanced Practice Providers on your designated Care Team:   [place], PA-C Melina Copa, PA-C . Ermalinda Barrios, PA-C  Any Other Special Instructions Will Be Listed Below (If Applicable).

## 2018-02-23 NOTE — Progress Notes (Signed)
Surgery is scheduled for Tuesday 2/18 with Dr. Prescott Gum.  Patient aware of pre-op.

## 2018-02-23 NOTE — Progress Notes (Signed)
PCP is System, Provider Not In Referring Provider is Leonie Man, MD  Chief Complaint  Patient presents with  . Lung Lesion    f/u from hosptial discharge with CXR,  discuss rescheduling surgery    HPI: 78 year old female reformed smoker with biopsy-proven left upper lobe lung cancer.  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  PET scan shows no regional or distant metastatic disease and brain scan is negative.  She reported for surgery about 2 weeks ago but had a severe upper respiratory infection with productive cough wheezing congestion and her surgery was canceled.  Her white count was elevated and she was placed on antibiotics followed by prednisone taper.  She had not been smoking.  She presents today for reexam and to discuss scheduling for surgery.  September 2019 she had urgent CABG x3 for unstable angina.  Her LV function was good.  At that time the nodule in the left upper lobe was noted.  Currently she states she is feeling much improved.  She has finished her course of prednisone and antibiotics.  Her appetite and overall strength are improved.  She still has a little bit of hoarseness.  Weight is stable.  She appears to be clinically ready for left VATS and pulmonary section about 2 weeks.     Past Medical History:  Diagnosis Date  . AKI (acute kidney injury) ([place]) 05/16/2017  . Anxiety   . Barrett's esophagus without dysplasia   . CAD (coronary artery disease)    a. LHC 10/16/11: dLM 20%, mLAD 90%, pOM1 30%, mOM1 70% (FFR not hemodynamically significant), prox and mid RCA 30%, EF 65%;  b. PCI 10/16/11: Promus DES x 2 to mLAD  . Cancer ([place])    left upper lobe  . COPD (chronic obstructive pulmonary disease) ([place])   . Depression   . Diverticulosis   . Essential hypertension   . Family history of adverse reaction to anesthesia    mother had "breathing problems"  . GERD (gastroesophageal reflux disease)   . Hyperlipidemia   . Rectocele     Past  Surgical History:  Procedure Laterality Date  . ABDOMINAL HYSTERECTOMY    . CARDIAC CATHETERIZATION    . CARDIAC CATHETERIZATION N/A 06/21/2015   Procedure: Right/Left Heart Cath and Coronary Angiography;  Surgeon: [name], MD;  Location: [facility];  Service: Cardiovascular;  Laterality: N/A;  . CARDIAC CATHETERIZATION  09/16/2017  . CATARACT EXTRACTION W/ INTRAOCULAR LENS  IMPLANT, BILATERAL Bilateral   . COLONOSCOPY  01/2008   Multiple diverticula in desc and sigm colon  . CORONARY ARTERY BYPASS GRAFT N/A 09/24/2017   Procedure: CORONARY ARTERY BYPASS GRAFTING (CABG) x4 using mammary artery and saphenous vein. LIMA to LAD, SVG to RCA, SVG to RAMUS, SVT to OM. Endoscopic saphenous vein harvest.;  Surgeon: Ivin Poot, MD;  Location: [facility];  Service: Open Heart Surgery;  Laterality: N/A;  . ESOPHAGOGASTRODUODENOSCOPY  01/2008   Barrett's esophagus, gastritis, no h.pylori, due follow-up 01/2011  . ESOPHAGOGASTRODUODENOSCOPY  04/06/2011   Procedure: ESOPHAGOGASTRODUODENOSCOPY (EGD);  Surgeon: Danie Binder, MD;  [facility];  Service: Endoscopy;  Laterality: N/A;  8:30  . FLEXIBLE SIGMOIDOSCOPY  08/11/2010 RECTAL PAIN/PRESSURE   SML IH, TICS  . FRACTIONAL FLOW RESERVE WIRE N/A 10/16/2011   Procedure: FRACTIONAL FLOW RESERVE WIRE;  Surgeon: [name], MD;  Location: [facility] CATH LAB;  Service: Cardiovascular;  Laterality: N/A;  . IR PERC PLEURAL DRAIN W/INDWELL CATH W/IMG GUIDE  10/01/2017  . LEFT HEART CATH AND CORONARY ANGIOGRAPHY N/A 09/16/2017   Procedure: LEFT HEART CATH AND CORONARY ANGIOGRAPHY;  Surgeon: [name], MD;  Location: [facility];  Service: Cardiovascular;  Laterality: N/A;  . LEFT HEART CATHETERIZATION WITH CORONARY ANGIOGRAM N/A 03/07/2012   Procedure: LEFT HEART CATHETERIZATION WITH CORONARY ANGIOGRAM;  Surgeon: [name], MD;  Location: [facility] CATH LAB;  Service: Cardiovascular;  Laterality: N/A;  . LEFT  HEART CATHETERIZATION WITH CORONARY ANGIOGRAM N/A 12/14/2012   Procedure: LEFT HEART CATHETERIZATION WITH CORONARY ANGIOGRAM;  Surgeon: [name], MD;  Location: [facility] CATH LAB;  Service: Cardiovascular;  Laterality: N/A;  . LEFT HEART CATHETERIZATION WITH CORONARY ANGIOGRAM N/A 05/07/2014   Procedure: LEFT HEART CATHETERIZATION WITH CORONARY ANGIOGRAM;  Surgeon: [name], MD;  Location: [facility] CATH LAB;  Service: Cardiovascular;  Laterality: N/A;  . PARTIAL HYSTERECTOMY  1980s  . TEE WITHOUT CARDIOVERSION N/A 09/24/2017   Procedure: TRANSESOPHAGEAL ECHOCARDIOGRAM (TEE);  Surgeon: Prescott Gum, Collier Salina, MD;  Location: [facility];  Service: Open Heart Surgery;  Laterality: N/A;    Family History  Problem Relation Age of Onset  . Coronary artery disease Father 4  . Heart attack Father 42  . GI problems Mother        Perforated colon   . Coronary artery disease Paternal Aunt   . Coronary artery disease Paternal Uncle   . Colon cancer Neg Hx   . Gastric cancer Neg Hx   . Esophageal cancer Neg Hx     Social History Social History   Tobacco Use  . Smoking status: Former Smoker    Packs/day: 1.00    Years: 53.00    Pack years: 53.00    Types: Cigarettes    Last attempt to quit: 03/20/2007    Years since quitting: 10.9  . Smokeless tobacco: Never Used  Substance Use Topics  . Alcohol use: No  . Drug use: No    Current Outpatient Medications  Medication Sig Dispense Refill  . acetaminophen (TYLENOL) 325 MG tablet Take 2 tablets (650 mg total) by mouth every 6 (six) hours as needed for mild pain (or Fever >/= 101).    Marland Kitchen ALPRAZolam (XANAX) 0.25 MG tablet Take 0.25 mg by mouth 2 (two) times daily as needed for anxiety.     Marland Kitchen aspirin 325 MG tablet Take 325 mg by mouth daily.    Marland Kitchen atorvastatin (LIPITOR) 20 MG tablet Take 1 tablet (20 mg total) by mouth at bedtime. 30 tablet 11  . lisinopril (PRINIVIL,ZESTRIL) 5 MG tablet Take 1 tablet (5 mg total) by mouth daily. 30 tablet 11  .  magnesium oxide (MAG-OX) 400 (241.3 Mg) MG tablet TAKE 1/2 (ONE-HALF) TABLET BY MOUTH ONCE DAILY 45 tablet 3  . memantine (NAMENDA) 10 MG tablet Take 10 mg by mouth 2 (two) times daily.     . metoprolol tartrate (LOPRESSOR) 25 MG tablet Take 0.5 tablets (12.5 mg total) by mouth 2 (two) times daily. 60 tablet 3  . nitroGLYCERIN (NITROSTAT) 0.4 MG SL tablet Place 1 tablet (0.4 mg total) under the tongue every 5 (five) minutes as needed for chest pain. 25 tablet 3  . pantoprazole (PROTONIX) 20 MG tablet Take 20 mg by mouth daily.    . predniSONE (DELTASONE) 5 MG tablet Take 4 tabs (20mg ) day 1, then 3 tabs (15mg ) day 2 , then 2 tabs (10mg ) day 3, then 1 tab (5mg ) day 4, and take 0.5 tab (2.5mg ) day 5. 11 tablet 0  .  venlafaxine XR (EFFEXOR-XR) 37.5 MG 24 hr capsule Take 37.5 mg by mouth at bedtime.    Marland Kitchen zolpidem (AMBIEN) 10 MG tablet Take 10 mg by mouth at bedtime as needed for sleep.     No current facility-administered medications for this visit.    Facility-Administered Medications Ordered in Other Visits  Medication Dose Route Frequency Provider Last Rate Last Dose  . midazolam (VERSED) 5 MG/5ML injection    Anesthesia Intra-op Shirlyn Goltz, CRNA   2 mg at 02/07/18 0715    Allergies  Allergen Reactions  . Gadolinium Derivatives Hives and Itching    Pt began having hives and itching about 5-7 minutes after contrast administered. Dr Weber Cooks evaluated pt and had Katie RN administer 50 mg Benedryl by IV. Dr Weber Cooks cleared pt to go home.  . Adhesive [Tape] Other (See Comments)    Turns skin red  . Sulfa Antibiotics Other (See Comments)    Childhood allergy    Review of Systems   Patient had subcutaneous emphysema after her CABG from COPD. Echocardiogram showed EF 55% She is in sinus rhythm She has had no blood thinners.   BP (!) 106/58   Pulse 80   Resp 20   Ht 5' 1.5" (1.562 m)   Wt 114 lb (51.7 kg)   SpO2 94% Comment: RA  BMI 21.19 kg/m  Physical Exam      Exam     General- alert and comfortable    Neck- no JVD, no cervical adenopathy palpable, no carotid bruit   Lungs- clear without rales, wheezes   Cor- regular rate and rhythm, no murmur , gallop   Abdomen- soft, non-tender   Extremities - warm, non-tender, minimal edema   Neuro- oriented, appropriate, no focal weakness   Diagnostic Tests: Chest x-ray image performed today personally reviewed.  No evidence of bronchitis. The left upper lobe nodule is still present and prominent  Impression/Plan Upper respiratory infection almost resolved.  Patient has biopsy-proven lung cancer in the left upper lobe. Proceed with left VATS and pulmonary section on Tuesday, February 18 at [facility].  I discussed the benefits and risks of the procedure with the patient.  She agrees to proceed.     Len Childs, MD [facility] [phone]

## 2018-03-02 NOTE — Pre-Procedure Instructions (Signed)
Rhonda Hughes  03/02/2018     [place] [address], [place] [address] Phone: [phone] Fax: [phone]  [place], [place] [facility] [address] [place] [address] [place] [address] Phone: [phone] Fax: [phone]   Your procedure is scheduled on Turesday, March 08, 2018  Report to  [facility] Entrance A at 5:30 A.M.  Call this number if you have problems the morning of surgery:  [phone]   Remember:  Do not eat or drink after midnight.  Take these medicines the morning of surgery with A SIP OF WATER  acetaminophen (TYLENOL)  ALPRAZolam (XANAX) atorvastatin (LIPITOR) Carboxymethylcellul-Glycerin (LUBRICATING EYE DROPS OP) memantine (NAMENDA)  metoprolol tartrate (LOPRESSOR) nitroGLYCERIN (NITROSTAT) - as needed pantoprazole (PROTONIX)  predniSONE (DELTASONE) venlafaxine XR (EFFEXOR-XR) zolpidem (AMBIEN)  As of today, STOP taking any Aspirin (unless otherwise instructed by your surgeon), Aleve, Naproxen, Ibuprofen, Motrin, Advil, Goody's, BC's, all herbal medications, fish oil, and all vitamins.  Follow your surgeon's instructions on when to stop Asprin.  If no instructions were given by your surgeon then you will need to call the office to get those instructions.     Do not wear jewelry, make-up or nail polish.  Do not wear lotions, powders, or perfumes, or deodorant.  Do not shave 48 hours prior to surgery.    Do not bring valuables to the hospital.  [facility] is not responsible for any belongings or valuables.  Contacts, dentures or bridgework may not be worn into surgery.  Leave your suitcase in the car.  After surgery it may be brought to your room.  For patients admitted to the hospital, discharge time will be determined by your treatment team.  Patients discharged the day of surgery will not be allowed to drive home.   Special instructions:  [facility]- Preparing For Surgery  Before surgery, you can play an important role. Because skin is not sterile, your skin needs to be as free of germs as possible. You can reduce the number of germs on your skin by washing with CHG (chlorahexidine gluconate) Soap before surgery.  CHG is an antiseptic cleaner which kills germs and bonds with the skin to continue killing germs even after washing.    Oral Hygiene is also important to reduce your risk of infection.  Remember - BRUSH YOUR TEETH THE MORNING OF SURGERY WITH YOUR REGULAR TOOTHPASTE  Please do not use if you have an allergy to CHG or antibacterial soaps. If your skin becomes reddened/irritated stop using the CHG.  Do not shave (including legs and underarms) for at least 48 hours prior to first CHG shower. It is OK to shave your face.  Please follow these instructions carefully.   1. Shower the NIGHT BEFORE SURGERY and the MORNING OF SURGERY with CHG.   2. If you chose to wash your hair, wash your hair first as usual with your normal shampoo.  3. After you shampoo, rinse your hair and body thoroughly to remove the shampoo.  4. Use CHG as you would any other liquid soap. You can apply CHG directly to the skin and wash gently with a scrungie or a clean washcloth.   5. Apply the CHG Soap to your body ONLY FROM THE NECK DOWN.  Do not use on open wounds or open sores. Avoid contact with your eyes, ears, mouth and genitals (private parts). Wash Face and genitals (private parts)  with your normal soap.  6. Wash thoroughly, paying special attention to the area where your surgery will be performed.  7. Thoroughly rinse your body with warm water from the neck down.  8. DO NOT shower/wash with your normal soap after using and rinsing off the CHG Soap.  9. Pat yourself dry with a CLEAN TOWEL.  10. Wear CLEAN PAJAMAS to bed the night before surgery, wear comfortable clothes the morning of surgery  11. Place CLEAN SHEETS on your bed the night of  your first shower and DO NOT SLEEP WITH PETS.  Day of Surgery:  Do not apply any deodorants/lotions.  Please wear clean clothes to the hospital/surgery center.   Remember to brush your teeth WITH YOUR REGULAR TOOTHPASTE.  Please read over the following fact sheets that you were given. Pain Booklet, Coughing and Deep Breathing and Surgical Site Infection Prevention

## 2018-03-03 ENCOUNTER — Other Ambulatory Visit: Payer: Self-pay

## 2018-03-03 ENCOUNTER — Encounter (HOSPITAL_COMMUNITY)
Admission: RE | Admit: 2018-03-03 | Discharge: 2018-03-03 | Disposition: A | Discharge status: Home / Self Care | Payer: Medicare Other | Source: Ambulatory Visit | Attending: Cardiothoracic Surgery | Admitting: Cardiothoracic Surgery

## 2018-03-03 ENCOUNTER — Encounter (HOSPITAL_COMMUNITY): Payer: Self-pay

## 2018-03-03 DIAGNOSIS — C3412 Malignant neoplasm of upper lobe, left bronchus or lung: Secondary | ICD-10-CM | POA: Diagnosis not present

## 2018-03-03 DIAGNOSIS — Z01818 Encounter for other preprocedural examination: Secondary | ICD-10-CM | POA: Diagnosis present

## 2018-03-03 LAB — URINALYSIS, ROUTINE W REFLEX MICROSCOPIC
Bacteria, UA: NONE SEEN
Bilirubin Urine: NEGATIVE
Glucose, UA: NEGATIVE mg/dL
Ketones, ur: NEGATIVE mg/dL
Leukocytes,Ua: NEGATIVE
Nitrite: NEGATIVE
Protein, ur: NEGATIVE mg/dL
Specific Gravity, Urine: 1.004 — ABNORMAL LOW (ref 1.005–1.030)
pH: 7 (ref 5.0–8.0)

## 2018-03-03 LAB — COMPREHENSIVE METABOLIC PANEL
ALT: 21 U/L (ref 0–44)
AST: 22 U/L (ref 15–41)
Albumin: 3.5 g/dL (ref 3.5–5.0)
Alkaline Phosphatase: 63 U/L (ref 38–126)
Anion gap: 8 (ref 5–15)
BUN: 6 mg/dL — ABNORMAL LOW (ref 8–23)
CO2: 27 mmol/L (ref 22–32)
Calcium: 8.7 mg/dL — ABNORMAL LOW (ref 8.9–10.3)
Chloride: 103 mmol/L (ref 98–111)
Creatinine, Ser: 0.83 mg/dL (ref 0.44–1.00)
GFR calc Af Amer: >60 mL/min (ref >60)
GFR calc non Af Amer: >60 mL/min (ref >60)
Glucose, Bld: 94 mg/dL (ref 70–99)
Potassium: 2.8 mmol/L — ABNORMAL LOW (ref 3.5–5.1)
Sodium: 138 mmol/L (ref 135–145)
Total Bilirubin: 0.9 mg/dL (ref 0.3–1.2)
Total Protein: 6.4 g/dL — ABNORMAL LOW (ref 6.5–8.1)

## 2018-03-03 LAB — APTT: aPTT: 27 seconds (ref 24–36)

## 2018-03-03 LAB — BLOOD GAS, ARTERIAL
Acid-Base Excess: 4.5 mmol/L — ABNORMAL HIGH (ref 0.0–2.0)
Bicarbonate: 28.5 mmol/L — ABNORMAL HIGH (ref 20.0–28.0)
Drawn by: 421801
FIO2: 21
O2 Saturation: 97.2 %
Patient temperature: 98.6
pCO2 arterial: 42.1 mmHg (ref 32.0–48.0)
pH, Arterial: 7.445 (ref 7.350–7.450)
pO2, Arterial: 91.2 mmHg (ref 83.0–108.0)

## 2018-03-03 LAB — CBC
HCT: 35.9 % — ABNORMAL LOW (ref 36.0–46.0)
Hemoglobin: 11.6 g/dL — ABNORMAL LOW (ref 12.0–15.0)
MCH: 31.4 pg (ref 26.0–34.0)
MCHC: 32.3 g/dL (ref 30.0–36.0)
MCV: 97.3 fL (ref 80.0–100.0)
Platelets: 222 10*3/uL (ref 150–400)
RBC: 3.69 MIL/uL — ABNORMAL LOW (ref 3.87–5.11)
RDW: 14.7 % (ref 11.5–15.5)
WBC: 10.9 10*3/uL — ABNORMAL HIGH (ref 4.0–10.5)
nRBC: 0 % (ref 0.0–0.2)

## 2018-03-03 LAB — SURGICAL PCR SCREEN
MRSA, PCR: NEGATIVE
Staphylococcus aureus: NEGATIVE

## 2018-03-03 LAB — PROTIME-INR
INR: 0.92
Prothrombin Time: 12.3 seconds (ref 11.4–15.2)

## 2018-03-03 NOTE — Pre-Procedure Instructions (Addendum)
Rhonda Hughes  03/03/2018      Your procedure is scheduled on Tuesday, March 08, 2018  Report to  [facility] Entrance A at 5:30 A.M.  Call this number if you have problems the morning of surgery:  [phone]   Remember:  Do not eat or drink after midnight.  Take these medicines the morning of surgery with A SIP OF WATER:  metoprolol tartrate (LOPRESSOR) memantine (NAMENDA) pantoprazole (PROTONIX)   If needed: ALPRAZolam Duanne Moron) Carboxymethylcellul-Glycerin (LUBRICATING EYE DROPS OP) nitroGLYCERIN (NITROSTAT) - as needed  Follow your surgeon's instructions on when to stop Asprin.  If no instructions were given by your surgeon then you will need to call the office to get those instructions.     As of today, STOP taking any Aspirin (unless otherwise instructed by your surgeon), Aleve, Naproxen, Ibuprofen, Motrin, Advil, Goody's, BC's, all herbal medications, fish oil, and all vitamins.   Do not wear jewelry, make-up or nail polish.  Do not wear lotions, powders, or perfumes, or deodorant.  Do not shave 48 hours prior to surgery.    Do not bring valuables to the hospital.  [facility] is not responsible for any belongings or valuables.  Contacts, dentures or bridgework may not be worn into surgery.  Leave your suitcase in the car.  After surgery it may be brought to your room.  For patients admitted to the hospital, discharge time will be determined by your treatment team.  Patients discharged the day of surgery will not be allowed to drive home.   Special instructions:  [facility]- Preparing For Surgery  Before surgery, you can play an important role. Because skin is not sterile, your skin needs to be as free of germs as possible. You can reduce the number of germs on your skin by washing with CHG (chlorahexidine gluconate) Soap before surgery.  CHG is an antiseptic cleaner which kills germs and bonds with the skin to continue killing germs even after washing.    Oral  Hygiene is also important to reduce your risk of infection.  Remember - BRUSH YOUR TEETH THE MORNING OF SURGERY WITH YOUR REGULAR TOOTHPASTE  Please do not use if you have an allergy to CHG or antibacterial soaps. If your skin becomes reddened/irritated stop using the CHG.  Do not shave (including legs and underarms) for at least 48 hours prior to first CHG shower. It is OK to shave your face.  Please follow these instructions carefully.   1. Shower the NIGHT BEFORE SURGERY and the MORNING OF SURGERY with CHG.   2. If you chose to wash your hair, wash your hair first as usual with your normal shampoo.  3. After you shampoo, rinse your hair and body thoroughly to remove the shampoo.  4. Use CHG as you would any other liquid soap. You can apply CHG directly to the skin and wash gently with a scrungie or a clean washcloth.   5. Apply the CHG Soap to your body ONLY FROM THE NECK DOWN.  Do not use on open wounds or open sores. Avoid contact with your eyes, ears, mouth and genitals (private parts). Wash Face and genitals (private parts)  with your normal soap.  6. Wash thoroughly, paying special attention to the area where your surgery will be performed.  7. Thoroughly rinse your body with warm water from the neck down.  8. DO NOT shower/wash with your normal soap after using and rinsing off the CHG Soap.  9. Pat yourself dry with  a CLEAN TOWEL.  10. Wear CLEAN PAJAMAS to bed the night before surgery, wear comfortable clothes the morning of surgery  11. Place CLEAN SHEETS on your bed the night of your first shower and DO NOT SLEEP WITH PETS.  Day of Surgery:  Do not apply any deodorants/lotions.  Please wear clean clothes to the hospital/surgery center.   Remember to brush your teeth WITH YOUR REGULAR TOOTHPASTE.  Please read over the following fact sheets that you were given.

## 2018-03-03 NOTE — Progress Notes (Signed)
Etter Sjogren, NP is PCP Lauree Chandler, MD is cardiologist.  EKG: Today CXR: DOS ECHO: 08/2017 Stress Test: 2014 Cardiac Cath: 2019   Patient denies shortness of breath, fever, cough, and chest pain at PAT appointment.  Patient verbalized understanding of instructions provided today at the PAT appointment.  Patient asked to review instructions at home and day of surgery.   Forward chart to anesthesia: heart history

## 2018-03-03 NOTE — Progress Notes (Signed)
Called Ryan RN to make aware of pt's labs and low potassium--states she would make Dr. Prescott Gum aware.

## 2018-03-04 NOTE — Anesthesia Preprocedure Evaluation (Addendum)
Anesthesia Evaluation  Patient identified by MRN, date of birth, ID band Patient awake    Reviewed: Allergy & Precautions, H&P , NPO status , Patient's Chart, lab work & pertinent test results, reviewed documented beta blocker date and time   History of Anesthesia Complications (+) Family history of anesthesia reaction  Airway Mallampati: II  TM Distance: >3 FB Neck ROM: Full    Dental no notable dental hx. (+) Partial Lower, Partial Upper, Dental Advisory Given   Pulmonary COPD, former smoker,    Pulmonary exam normal breath sounds clear to auscultation       Cardiovascular Exercise Tolerance: Good hypertension, Pt. on medications and Pt. on home beta blockers + CAD and + Cardiac Stents   Rhythm:Regular Rate:Normal     Neuro/Psych Anxiety Depression negative neurological ROS     GI/Hepatic Neg liver ROS, GERD  Medicated and Controlled,  Endo/Other  negative endocrine ROS  Renal/GU negative Renal ROS  negative genitourinary   Musculoskeletal   Abdominal   Peds  Hematology negative hematology ROS (+)   Anesthesia Other Findings   Reproductive/Obstetrics negative OB ROS                           Anesthesia Physical Anesthesia Plan  ASA: III  Anesthesia Plan: General   Post-op Pain Management:    Induction: Intravenous  PONV Risk Score and Plan: 4 or greater and Ondansetron, Dexamethasone and Midazolam  Airway Management Planned: Double Lumen EBT  Additional Equipment: Arterial line and CVP  Intra-op Plan:   Post-operative Plan: Extubation in OR and Possible Post-op intubation/ventilation  Informed Consent: I have reviewed the patients History and Physical, chart, labs and discussed the procedure including the risks, benefits and alternatives for the proposed anesthesia with the patient or authorized representative who has indicated his/her understanding and acceptance.      Dental advisory given  Plan Discussed with: CRNA  Anesthesia Plan Comments: (See previous note 02/03/18 by Karoline Caldwell, PA-C. Procedure cancelled at that time due to pt illness. In the interim she was seen again by cardiology and felt to be doing well. Also seen by Dr. Prescott Gum and felt to be recovered and ready to proceed with surgery. Hypokalemia with K+ 2.8 on preop labs. Will recheck BMP DOS. )     Anesthesia Quick Evaluation

## 2018-03-07 ENCOUNTER — Telehealth: Payer: Self-pay

## 2018-03-07 DIAGNOSIS — E876 Hypokalemia: Secondary | ICD-10-CM

## 2018-03-07 MED ORDER — POTASSIUM CHLORIDE 20 MEQ PO PACK: 20.0000 meq | PACK | Freq: Four times a day (QID) | ORAL | 0 refills | Status: DC

## 2018-03-07 NOTE — Telephone Encounter (Signed)
-----   Message from Laury Deep, RN sent at 03/07/2018  8:36 AM EST ----- See below from [facility].  This lady is for surgery tomorrow morning.  Thx-Ryan ----- Message ----- From: Ivin Poot, MD Sent: 03/06/2018   9:50 AM EST To: Laury Deep, RN  Call in potassium 20 mEq take qid in Monday  -- 4 doses

## 2018-03-08 ENCOUNTER — Inpatient Hospital Stay (HOSPITAL_COMMUNITY): Payer: Medicare Other

## 2018-03-08 ENCOUNTER — Encounter (HOSPITAL_COMMUNITY): Payer: Self-pay | Admitting: *Deleted

## 2018-03-08 ENCOUNTER — Inpatient Hospital Stay (HOSPITAL_COMMUNITY): Payer: Medicare Other | Admitting: Physician Assistant

## 2018-03-08 ENCOUNTER — Encounter (HOSPITAL_COMMUNITY)
Admission: RE | Disposition: A | Discharge status: Home Health | Payer: Self-pay | Source: Home / Self Care | Attending: Cardiothoracic Surgery

## 2018-03-08 ENCOUNTER — Inpatient Hospital Stay (HOSPITAL_COMMUNITY)
Admission: RE | Admit: 2018-03-08 | Discharge: 2018-03-19 | DRG: 164 | Disposition: A | Discharge status: Home Health | Payer: Medicare Other | Attending: Cardiothoracic Surgery | Admitting: Cardiothoracic Surgery

## 2018-03-08 DIAGNOSIS — F419 Anxiety disorder, unspecified: Secondary | ICD-10-CM | POA: Diagnosis present

## 2018-03-08 DIAGNOSIS — Z951 Presence of aortocoronary bypass graft: Secondary | ICD-10-CM

## 2018-03-08 DIAGNOSIS — J9382 Other air leak: Secondary | ICD-10-CM | POA: Diagnosis not present

## 2018-03-08 DIAGNOSIS — E876 Hypokalemia: Secondary | ICD-10-CM | POA: Diagnosis present

## 2018-03-08 DIAGNOSIS — Z9689 Presence of other specified functional implants: Secondary | ICD-10-CM

## 2018-03-08 DIAGNOSIS — J95811 Postprocedural pneumothorax: Secondary | ICD-10-CM | POA: Diagnosis not present

## 2018-03-08 DIAGNOSIS — D62 Acute posthemorrhagic anemia: Secondary | ICD-10-CM | POA: Diagnosis not present

## 2018-03-08 DIAGNOSIS — Z90710 Acquired absence of both cervix and uterus: Secondary | ICD-10-CM | POA: Diagnosis not present

## 2018-03-08 DIAGNOSIS — I10 Essential (primary) hypertension: Secondary | ICD-10-CM | POA: Diagnosis present

## 2018-03-08 DIAGNOSIS — Z955 Presence of coronary angioplasty implant and graft: Secondary | ICD-10-CM

## 2018-03-08 DIAGNOSIS — Z01811 Encounter for preprocedural respiratory examination: Secondary | ICD-10-CM

## 2018-03-08 DIAGNOSIS — Z7982 Long term (current) use of aspirin: Secondary | ICD-10-CM | POA: Diagnosis not present

## 2018-03-08 DIAGNOSIS — K660 Peritoneal adhesions (postprocedural) (postinfection): Secondary | ICD-10-CM | POA: Diagnosis present

## 2018-03-08 DIAGNOSIS — E871 Hypo-osmolality and hyponatremia: Secondary | ICD-10-CM | POA: Diagnosis not present

## 2018-03-08 DIAGNOSIS — Z9889 Other specified postprocedural states: Secondary | ICD-10-CM

## 2018-03-08 DIAGNOSIS — Z87891 Personal history of nicotine dependence: Secondary | ICD-10-CM

## 2018-03-08 DIAGNOSIS — I2510 Atherosclerotic heart disease of native coronary artery without angina pectoris: Secondary | ICD-10-CM | POA: Diagnosis present

## 2018-03-08 DIAGNOSIS — K5900 Constipation, unspecified: Secondary | ICD-10-CM | POA: Diagnosis present

## 2018-03-08 DIAGNOSIS — Z79899 Other long term (current) drug therapy: Secondary | ICD-10-CM

## 2018-03-08 DIAGNOSIS — C3412 Malignant neoplasm of upper lobe, left bronchus or lung: Principal | ICD-10-CM | POA: Diagnosis present

## 2018-03-08 DIAGNOSIS — E785 Hyperlipidemia, unspecified: Secondary | ICD-10-CM | POA: Diagnosis present

## 2018-03-08 DIAGNOSIS — K219 Gastro-esophageal reflux disease without esophagitis: Secondary | ICD-10-CM | POA: Diagnosis present

## 2018-03-08 DIAGNOSIS — Z902 Acquired absence of lung [part of]: Secondary | ICD-10-CM

## 2018-03-08 DIAGNOSIS — R911 Solitary pulmonary nodule: Secondary | ICD-10-CM | POA: Diagnosis present

## 2018-03-08 HISTORY — PX: VIDEO ASSISTED THORACOSCOPY (VATS)/ LOBECTOMY: SHX6169

## 2018-03-08 LAB — BLOOD GAS, ARTERIAL
Acid-Base Excess: 2.1 mmol/L — ABNORMAL HIGH (ref 0.0–2.0)
Bicarbonate: 26.7 mmol/L (ref 20.0–28.0)
Drawn by: 354931
O2 Content: 7 L/min
O2 Saturation: 98 %
Patient temperature: 98.6
pCO2 arterial: 45.6 mmHg (ref 32.0–48.0)
pH, Arterial: 7.385 (ref 7.350–7.450)
pO2, Arterial: 115 mmHg — ABNORMAL HIGH (ref 83.0–108.0)

## 2018-03-08 LAB — BASIC METABOLIC PANEL
Anion gap: 7 (ref 5–15)
BUN: 8 mg/dL (ref 8–23)
CHLORIDE: 105 mmol/L (ref 98–111)
CO2: 30 mmol/L (ref 22–32)
Calcium: 9.4 mg/dL (ref 8.9–10.3)
Creatinine, Ser: 0.93 mg/dL (ref 0.44–1.00)
GFR calc Af Amer: >60 mL/min (ref >60)
GFR calc non Af Amer: >60 mL/min (ref >60)
Glucose, Bld: 98 mg/dL (ref 70–99)
Potassium: 4 mmol/L (ref 3.5–5.1)
Sodium: 142 mmol/L (ref 135–145)

## 2018-03-08 LAB — PREPARE RBC (CROSSMATCH)

## 2018-03-08 LAB — GLUCOSE, CAPILLARY
Glucose-Capillary: 250 mg/dL — ABNORMAL HIGH (ref 70–99)
Glucose-Capillary: 246 mg/dL — ABNORMAL HIGH (ref 70–99)

## 2018-03-08 SURGERY — VIDEO ASSISTED THORACOSCOPY (VATS)/ LOBECTOMY
Anesthesia: General | Site: Chest | Laterality: Left

## 2018-03-08 MED ORDER — LEVALBUTEROL HCL 0.63 MG/3ML IN NEBU
0.6300 mg | INHALATION_SOLUTION | Freq: Three times a day (TID) | RESPIRATORY_TRACT | Status: DC
  Administered 2018-03-09 – 2018-03-10 (×3): 0.63 mg via RESPIRATORY_TRACT
  Filled 2018-03-08 (×3): qty 3

## 2018-03-08 MED ORDER — SUGAMMADEX SODIUM 200 MG/2ML IV SOLN
INTRAVENOUS | Status: DC | PRN
  Administered 2018-03-08: 150 mg via INTRAVENOUS

## 2018-03-08 MED ORDER — MIDAZOLAM HCL 2 MG/2ML IJ SOLN
INTRAMUSCULAR | Status: AC
  Filled 2018-03-08: qty 2

## 2018-03-08 MED ORDER — ACETAMINOPHEN 500 MG PO TABS
1000.0000 mg | ORAL_TABLET | Freq: Once | ORAL | Status: AC
  Administered 2018-03-08: 1000 mg via ORAL
  Filled 2018-03-08: qty 2

## 2018-03-08 MED ORDER — ONDANSETRON HCL 4 MG/2ML IJ SOLN
4.0000 mg | Freq: Four times a day (QID) | INTRAMUSCULAR | Status: DC | PRN
  Administered 2018-03-09: 4 mg via INTRAVENOUS
  Filled 2018-03-08: qty 2

## 2018-03-08 MED ORDER — CEFAZOLIN SODIUM-DEXTROSE 2-4 GM/100ML-% IV SOLN
2.0000 g | INTRAVENOUS | Status: AC
  Administered 2018-03-08: 2 g via INTRAVENOUS
  Filled 2018-03-08: qty 100

## 2018-03-08 MED ORDER — CEFAZOLIN SODIUM-DEXTROSE 2-4 GM/100ML-% IV SOLN
INTRAVENOUS | Status: AC
  Filled 2018-03-08: qty 100

## 2018-03-08 MED ORDER — LIDOCAINE 2% (20 MG/ML) 5 ML SYRINGE
INTRAMUSCULAR | Status: AC
  Filled 2018-03-08: qty 5

## 2018-03-08 MED ORDER — HEMOSTATIC AGENTS (NO CHARGE) OPTIME
TOPICAL | Status: DC | PRN
  Administered 2018-03-08: 1 via TOPICAL

## 2018-03-08 MED ORDER — PHENYLEPHRINE 40 MCG/ML (10ML) SYRINGE FOR IV PUSH (FOR BLOOD PRESSURE SUPPORT)
PREFILLED_SYRINGE | INTRAVENOUS | Status: DC | PRN
  Administered 2018-03-08: 40 ug via INTRAVENOUS
  Administered 2018-03-08: 120 ug via INTRAVENOUS
  Administered 2018-03-08: 80 ug via INTRAVENOUS
  Administered 2018-03-08: 40 ug via INTRAVENOUS
  Administered 2018-03-08: 120 ug via INTRAVENOUS

## 2018-03-08 MED ORDER — ENOXAPARIN SODIUM 40 MG/0.4ML ~~LOC~~ SOLN
40.0000 mg | Freq: Every day | SUBCUTANEOUS | Status: DC
  Administered 2018-03-09 – 2018-03-19 (×11): 40 mg via SUBCUTANEOUS
  Filled 2018-03-08 (×11): qty 0.4

## 2018-03-08 MED ORDER — ONDANSETRON HCL 4 MG/2ML IJ SOLN
INTRAMUSCULAR | Status: DC | PRN
  Administered 2018-03-08: 4 mg via INTRAVENOUS

## 2018-03-08 MED ORDER — DEXTROSE-NACL 5-0.45 % IV SOLN
INTRAVENOUS | Status: DC
  Administered 2018-03-08 – 2018-03-09 (×2): via INTRAVENOUS

## 2018-03-08 MED ORDER — INSULIN ASPART 100 UNIT/ML ~~LOC~~ SOLN
0.0000 [IU] | SUBCUTANEOUS | Status: DC
  Administered 2018-03-08 (×2): 8 [IU] via SUBCUTANEOUS

## 2018-03-08 MED ORDER — ROCURONIUM BROMIDE 50 MG/5ML IV SOSY
PREFILLED_SYRINGE | INTRAVENOUS | Status: AC
  Filled 2018-03-08: qty 10

## 2018-03-08 MED ORDER — CEFAZOLIN SODIUM-DEXTROSE 2-4 GM/100ML-% IV SOLN
2.0000 g | Freq: Three times a day (TID) | INTRAVENOUS | Status: AC
  Administered 2018-03-08 (×2): 2 g via INTRAVENOUS
  Filled 2018-03-08: qty 100

## 2018-03-08 MED ORDER — BUPIVACAINE HCL (PF) 0.5 % IJ SOLN
INTRAMUSCULAR | Status: AC
  Filled 2018-03-08: qty 10

## 2018-03-08 MED ORDER — LACTATED RINGERS IV SOLN
INTRAVENOUS | Status: DC | PRN
  Administered 2018-03-08 (×3): via INTRAVENOUS

## 2018-03-08 MED ORDER — PHENYLEPHRINE 40 MCG/ML (10ML) SYRINGE FOR IV PUSH (FOR BLOOD PRESSURE SUPPORT)
PREFILLED_SYRINGE | INTRAVENOUS | Status: AC
  Filled 2018-03-08: qty 10

## 2018-03-08 MED ORDER — TRAMADOL HCL 50 MG PO TABS
50.0000 mg | ORAL_TABLET | Freq: Four times a day (QID) | ORAL | Status: DC | PRN
  Administered 2018-03-13: 100 mg via ORAL
  Administered 2018-03-14 – 2018-03-15 (×3): 50 mg via ORAL
  Administered 2018-03-16 (×2): 100 mg via ORAL
  Administered 2018-03-17 – 2018-03-18 (×3): 50 mg via ORAL
  Filled 2018-03-08: qty 2
  Filled 2018-03-08: qty 1
  Filled 2018-03-08: qty 2
  Filled 2018-03-08 (×5): qty 1
  Filled 2018-03-08: qty 2
  Filled 2018-03-08: qty 1

## 2018-03-08 MED ORDER — KETOROLAC TROMETHAMINE 30 MG/ML IJ SOLN
INTRAMUSCULAR | Status: DC | PRN
  Administered 2018-03-08: 15 mg via INTRAVENOUS

## 2018-03-08 MED ORDER — BUPIVACAINE HCL (PF) 0.5 % IJ SOLN
INTRAMUSCULAR | Status: DC | PRN
  Administered 2018-03-08 (×2): 10 mL

## 2018-03-08 MED ORDER — LEVALBUTEROL HCL 0.63 MG/3ML IN NEBU
0.6300 mg | INHALATION_SOLUTION | Freq: Four times a day (QID) | RESPIRATORY_TRACT | Status: DC
  Administered 2018-03-08: 0.63 mg via RESPIRATORY_TRACT
  Filled 2018-03-08: qty 3

## 2018-03-08 MED ORDER — LIDOCAINE 2% (20 MG/ML) 5 ML SYRINGE
INTRAMUSCULAR | Status: DC | PRN
  Administered 2018-03-08: 60 mg via INTRAVENOUS

## 2018-03-08 MED ORDER — ROCURONIUM BROMIDE 50 MG/5ML IV SOSY
PREFILLED_SYRINGE | INTRAVENOUS | Status: DC | PRN
  Administered 2018-03-08: 50 mg via INTRAVENOUS
  Administered 2018-03-08: 20 mg via INTRAVENOUS
  Administered 2018-03-08: 30 mg via INTRAVENOUS

## 2018-03-08 MED ORDER — BUPIVACAINE ON-Q PAIN PUMP (FOR ORDER SET NO CHG)
INJECTION | Status: DC
  Filled 2018-03-08: qty 1

## 2018-03-08 MED ORDER — GLYCOPYRROLATE PF 0.2 MG/ML IJ SOSY
PREFILLED_SYRINGE | INTRAMUSCULAR | Status: AC
  Filled 2018-03-08: qty 1

## 2018-03-08 MED ORDER — GLYCOPYRROLATE PF 0.2 MG/ML IJ SOSY
PREFILLED_SYRINGE | INTRAMUSCULAR | Status: DC | PRN
  Administered 2018-03-08: .1 mg via INTRAVENOUS

## 2018-03-08 MED ORDER — KETOROLAC TROMETHAMINE 30 MG/ML IJ SOLN
INTRAMUSCULAR | Status: AC
  Filled 2018-03-08: qty 1

## 2018-03-08 MED ORDER — SODIUM CHLORIDE 0.9 % IV SOLN
INTRAVENOUS | Status: DC | PRN
  Administered 2018-03-08: 40 ug/min via INTRAVENOUS

## 2018-03-08 MED ORDER — HYDROMORPHONE HCL 1 MG/ML IJ SOLN
0.2500 mg | INTRAMUSCULAR | Status: DC | PRN
  Administered 2018-03-08 (×2): 0.25 mg via INTRAVENOUS

## 2018-03-08 MED ORDER — BISACODYL 5 MG PO TBEC
10.0000 mg | DELAYED_RELEASE_TABLET | Freq: Every day | ORAL | Status: DC
  Administered 2018-03-09 – 2018-03-18 (×6): 10 mg via ORAL
  Filled 2018-03-08 (×7): qty 2

## 2018-03-08 MED ORDER — PROPOFOL 10 MG/ML IV BOLUS
INTRAVENOUS | Status: AC
  Filled 2018-03-08: qty 20

## 2018-03-08 MED ORDER — FENTANYL CITRATE (PF) 250 MCG/5ML IJ SOLN
INTRAMUSCULAR | Status: AC
  Filled 2018-03-08: qty 5

## 2018-03-08 MED ORDER — SODIUM CHLORIDE 0.9% FLUSH: 9.0000 mL | INTRAVENOUS | Status: DC | PRN

## 2018-03-08 MED ORDER — DEXAMETHASONE SODIUM PHOSPHATE 10 MG/ML IJ SOLN
INTRAMUSCULAR | Status: AC
  Filled 2018-03-08: qty 1

## 2018-03-08 MED ORDER — MIDAZOLAM HCL 5 MG/5ML IJ SOLN
INTRAMUSCULAR | Status: DC | PRN
  Administered 2018-03-08: 1 mg via INTRAVENOUS

## 2018-03-08 MED ORDER — POTASSIUM CHLORIDE 10 MEQ/50ML IV SOLN: 10.0000 meq | Freq: Every day | INTRAVENOUS | Status: DC | PRN

## 2018-03-08 MED ORDER — LABETALOL HCL 5 MG/ML IV SOLN: 5.0000 mg | INTRAVENOUS | Status: DC | PRN

## 2018-03-08 MED ORDER — NALOXONE HCL 0.4 MG/ML IJ SOLN: 0.4000 mg | INTRAMUSCULAR | Status: DC | PRN

## 2018-03-08 MED ORDER — 0.9 % SODIUM CHLORIDE (POUR BTL) OPTIME
TOPICAL | Status: DC | PRN
  Administered 2018-03-08: 2000 mL

## 2018-03-08 MED ORDER — EPHEDRINE SULFATE-NACL 50-0.9 MG/10ML-% IV SOSY
PREFILLED_SYRINGE | INTRAVENOUS | Status: DC | PRN
  Administered 2018-03-08 (×2): 5 mg via INTRAVENOUS
  Administered 2018-03-08: 10 mg via INTRAVENOUS

## 2018-03-08 MED ORDER — ALPRAZOLAM 0.25 MG PO TABS
0.2500 mg | ORAL_TABLET | Freq: Two times a day (BID) | ORAL | Status: DC | PRN
  Administered 2018-03-10 – 2018-03-15 (×2): 0.25 mg via ORAL
  Filled 2018-03-08 (×2): qty 1

## 2018-03-08 MED ORDER — ORAL CARE MOUTH RINSE
15.0000 mL | Freq: Two times a day (BID) | OROMUCOSAL | Status: DC
  Administered 2018-03-09 – 2018-03-19 (×15): 15 mL via OROMUCOSAL

## 2018-03-08 MED ORDER — DEXAMETHASONE SODIUM PHOSPHATE 10 MG/ML IJ SOLN
INTRAMUSCULAR | Status: DC | PRN
  Administered 2018-03-08: 10 mg via INTRAVENOUS

## 2018-03-08 MED ORDER — FENTANYL CITRATE (PF) 100 MCG/2ML IJ SOLN
INTRAMUSCULAR | Status: DC | PRN
  Administered 2018-03-08 (×2): 50 ug via INTRAVENOUS
  Administered 2018-03-08 (×2): 100 ug via INTRAVENOUS
  Administered 2018-03-08: 50 ug via INTRAVENOUS

## 2018-03-08 MED ORDER — ACETAMINOPHEN 160 MG/5ML PO SOLN: 1000.0000 mg | Freq: Four times a day (QID) | ORAL | Status: AC

## 2018-03-08 MED ORDER — BUPIVACAINE 0.5 % ON-Q PUMP SINGLE CATH 400 ML
400.0000 mL | INJECTION | Status: AC
  Administered 2018-03-08: 400 mL
  Filled 2018-03-08: qty 310

## 2018-03-08 MED ORDER — ATORVASTATIN CALCIUM 10 MG PO TABS
20.0000 mg | ORAL_TABLET | Freq: Every day | ORAL | Status: DC
  Administered 2018-03-09 – 2018-03-18 (×10): 20 mg via ORAL
  Filled 2018-03-08 (×10): qty 2

## 2018-03-08 MED ORDER — ONDANSETRON HCL 4 MG/2ML IJ SOLN
INTRAMUSCULAR | Status: AC
  Filled 2018-03-08: qty 2

## 2018-03-08 MED ORDER — PROPOFOL 10 MG/ML IV BOLUS
INTRAVENOUS | Status: DC | PRN
  Administered 2018-03-08: 80 mg via INTRAVENOUS

## 2018-03-08 MED ORDER — ACETAMINOPHEN 500 MG PO TABS
1000.0000 mg | ORAL_TABLET | Freq: Four times a day (QID) | ORAL | Status: AC
  Administered 2018-03-08 – 2018-03-13 (×11): 1000 mg via ORAL
  Filled 2018-03-08 (×13): qty 2

## 2018-03-08 MED ORDER — DIPHENHYDRAMINE HCL 12.5 MG/5ML PO ELIX
12.5000 mg | ORAL_SOLUTION | Freq: Four times a day (QID) | ORAL | Status: DC | PRN
  Filled 2018-03-08: qty 5

## 2018-03-08 MED ORDER — FENTANYL 40 MCG/ML IV SOLN
INTRAVENOUS | Status: DC
  Administered 2018-03-08: 1000 ug via INTRAVENOUS
  Administered 2018-03-09: 0 ug via INTRAVENOUS
  Filled 2018-03-08: qty 1000

## 2018-03-08 MED ORDER — CARBOXYMETHYLCELLUL-GLYCERIN 0.5-0.9 % OP SOLN: 1.0000 [drp] | Freq: Every day | OPHTHALMIC | Status: DC | PRN

## 2018-03-08 MED ORDER — OXYCODONE HCL 5 MG PO TABS: 5.0000 mg | ORAL_TABLET | ORAL | Status: DC | PRN

## 2018-03-08 MED ORDER — HYDROMORPHONE HCL 1 MG/ML IJ SOLN
INTRAMUSCULAR | Status: AC
  Filled 2018-03-08: qty 1

## 2018-03-08 MED ORDER — SENNOSIDES-DOCUSATE SODIUM 8.6-50 MG PO TABS
1.0000 | ORAL_TABLET | Freq: Every day | ORAL | Status: DC
  Administered 2018-03-08 – 2018-03-10 (×3): 1 via ORAL
  Filled 2018-03-08 (×6): qty 1

## 2018-03-08 MED ORDER — DIPHENHYDRAMINE HCL 50 MG/ML IJ SOLN: 12.5000 mg | Freq: Four times a day (QID) | INTRAMUSCULAR | Status: DC | PRN

## 2018-03-08 MED ORDER — VENLAFAXINE HCL ER 37.5 MG PO CP24
37.5000 mg | ORAL_CAPSULE | Freq: Every day | ORAL | Status: DC
  Administered 2018-03-09 – 2018-03-18 (×10): 37.5 mg via ORAL
  Filled 2018-03-08 (×11): qty 1

## 2018-03-08 SURGICAL SUPPLY — 90 items
APPLICATOR TIP COSEAL (VASCULAR PRODUCTS) ×3 IMPLANT
APPLICATOR TIP EXT COSEAL (VASCULAR PRODUCTS) ×3 IMPLANT
BAG DECANTER FOR FLEXI CONT (MISCELLANEOUS) IMPLANT
BIT DRILL 7/64X5 DISP (BIT) ×3 IMPLANT
BLADE SURG 11 STRL SS (BLADE) ×3 IMPLANT
CANISTER SUCT 3000ML PPV (MISCELLANEOUS) ×3 IMPLANT
CATH KIT ON Q 5IN SLV (PAIN MANAGEMENT) IMPLANT
CATH KIT ON-Q SILVERSOAKER 2.5 (CATHETERS) ×3 IMPLANT
CATH THORACIC 28FR (CATHETERS) ×3 IMPLANT
CATH THORACIC 36FR (CATHETERS) IMPLANT
CATH THORACIC 36FR RT ANG (CATHETERS) IMPLANT
CLIP VESOCCLUDE MED 24/CT (CLIP) ×3 IMPLANT
CLIP VESOCCLUDE SM WIDE 24/CT (CLIP) ×3 IMPLANT
CONN ST 1/4X3/8  BEN (MISCELLANEOUS) ×2
CONN ST 1/4X3/8 BEN (MISCELLANEOUS) ×1 IMPLANT
CONN Y 3/8X3/8X3/8  BEN (MISCELLANEOUS)
CONN Y 3/8X3/8X3/8 BEN (MISCELLANEOUS) IMPLANT
CONT SPEC 4OZ CLIKSEAL STRL BL (MISCELLANEOUS) ×9 IMPLANT
COVER WAND RF STERILE (DRAPES) IMPLANT
CUTTER ECHEON FLEX ENDO 45 340 (ENDOMECHANICALS) ×3 IMPLANT
DERMABOND ADVANCED (GAUZE/BANDAGES/DRESSINGS) ×2
DERMABOND ADVANCED .7 DNX12 (GAUZE/BANDAGES/DRESSINGS) ×1 IMPLANT
DRAIN CHANNEL 28F RND 3/8 FF (WOUND CARE) ×3 IMPLANT
DRAPE LAPAROSCOPIC ABDOMINAL (DRAPES) ×3 IMPLANT
ELECT BLADE 4.0 EZ CLEAN MEGAD (MISCELLANEOUS) ×3
ELECT BLADE 6.5 EXT (BLADE) ×6 IMPLANT
ELECT REM PT RETURN 9FT ADLT (ELECTROSURGICAL) ×3
ELECTRODE BLDE 4.0 EZ CLN MEGD (MISCELLANEOUS) ×1 IMPLANT
ELECTRODE REM PT RTRN 9FT ADLT (ELECTROSURGICAL) ×1 IMPLANT
FELT TEFLON 1X6 (MISCELLANEOUS) ×3 IMPLANT
GAUZE SPONGE 4X4 12PLY STRL (GAUZE/BANDAGES/DRESSINGS) ×3 IMPLANT
GAUZE SPONGE 4X4 12PLY STRL LF (GAUZE/BANDAGES/DRESSINGS) ×3 IMPLANT
GLOVE BIO SURGEON STRL SZ 6 (GLOVE) ×6 IMPLANT
GLOVE BIO SURGEON STRL SZ 6.5 (GLOVE) ×2 IMPLANT
GLOVE BIO SURGEON STRL SZ7.5 (GLOVE) ×6 IMPLANT
GLOVE BIO SURGEONS STRL SZ 6.5 (GLOVE) ×1
GLOVE BIOGEL PI IND STRL 6.5 (GLOVE) ×1 IMPLANT
GLOVE BIOGEL PI INDICATOR 6.5 (GLOVE) ×2
GOWN STRL REUS W/ TWL LRG LVL3 (GOWN DISPOSABLE) ×4 IMPLANT
GOWN STRL REUS W/TWL LRG LVL3 (GOWN DISPOSABLE) ×8
HEMOSTAT SURGICEL 2X14 (HEMOSTASIS) ×3 IMPLANT
KIT BASIN OR (CUSTOM PROCEDURE TRAY) ×3 IMPLANT
KIT SUCTION CATH 14FR (SUCTIONS) IMPLANT
KIT TURNOVER KIT B (KITS) ×3 IMPLANT
NDL SUT 6 .5 CRC .975X.05 MAYO (NEEDLE) ×1 IMPLANT
NEEDLE MAYO TAPER (NEEDLE) ×2
NS IRRIG 1000ML POUR BTL (IV SOLUTION) ×12 IMPLANT
PACK CHEST (CUSTOM PROCEDURE TRAY) ×3 IMPLANT
PAD ARMBOARD 7.5X6 YLW CONV (MISCELLANEOUS) ×9 IMPLANT
PASSER SUT SWANSON 36MM LOOP (INSTRUMENTS) ×3 IMPLANT
SEALANT PROGEL (MISCELLANEOUS) IMPLANT
SEALANT SURG COSEAL 4ML (VASCULAR PRODUCTS) IMPLANT
SEALANT SURG COSEAL 8ML (VASCULAR PRODUCTS) ×6 IMPLANT
SOLUTION ANTI FOG 6CC (MISCELLANEOUS) ×3 IMPLANT
SPONGE INTESTINAL PEANUT (DISPOSABLE) ×6 IMPLANT
SPONGE LAP 18X18 X RAY DECT (DISPOSABLE) ×3 IMPLANT
SPONGE TONSIL TAPE 1 RFD (DISPOSABLE) ×18 IMPLANT
STAPLE RELOAD 2.5MM WHITE (STAPLE) ×18 IMPLANT
STAPLE RELOAD 45 GRN (STAPLE) ×1 IMPLANT
STAPLE RELOAD 45MM GOLD (STAPLE) ×15 IMPLANT
STAPLE RELOAD 45MM GREEN (STAPLE) ×2
STAPLER VASCULAR ECHELON 35 (CUTTER) ×3 IMPLANT
SUT CHROMIC 4 0 SH 27 (SUTURE) ×3 IMPLANT
SUT ETHILON 3 0 PS 1 (SUTURE) IMPLANT
SUT PROLENE 3 0 SH DA (SUTURE) IMPLANT
SUT PROLENE 4 0 RB 1 (SUTURE) ×6
SUT PROLENE 4-0 RB1 .5 CRCL 36 (SUTURE) ×3 IMPLANT
SUT SILK  1 MH (SUTURE) ×6
SUT SILK 1 MH (SUTURE) ×3 IMPLANT
SUT SILK 2 0 SH CR/8 (SUTURE) ×3 IMPLANT
SUT SILK 2 0SH CR/8 30 (SUTURE) IMPLANT
SUT SILK 3 0SH CR/8 30 (SUTURE) IMPLANT
SUT VIC AB 1 CTX 18 (SUTURE) ×9 IMPLANT
SUT VIC AB 1 CTX 27 (SUTURE) ×3 IMPLANT
SUT VIC AB 2-0 CTX 36 (SUTURE) ×3 IMPLANT
SUT VIC AB 3-0 SH 18 (SUTURE) ×3 IMPLANT
SUT VIC AB 3-0 X1 27 (SUTURE) ×9 IMPLANT
SUT VICRYL 0 UR6 27IN ABS (SUTURE) ×9 IMPLANT
SUT VICRYL 2 TP 1 (SUTURE) ×6 IMPLANT
SYR 10ML LL (SYRINGE) ×3 IMPLANT
SYSTEM SAHARA CHEST DRAIN ATS (WOUND CARE) ×3 IMPLANT
TAPE CLOTH SURG 4X10 WHT LF (GAUZE/BANDAGES/DRESSINGS) ×3 IMPLANT
TIP APPLICATOR SPRAY EXTEND 16 (VASCULAR PRODUCTS) IMPLANT
TOWEL GREEN STERILE (TOWEL DISPOSABLE) ×3 IMPLANT
TOWEL GREEN STERILE FF (TOWEL DISPOSABLE) ×3 IMPLANT
TRAP SPECIMEN MUCOUS 40CC (MISCELLANEOUS) IMPLANT
TRAY FOLEY MTR SLVR 16FR STAT (SET/KITS/TRAYS/PACK) ×3 IMPLANT
TROCAR BLADELESS 5MM (ENDOMECHANICALS) IMPLANT
TUNNELER SHEATH ON-Q 11GX8 DSP (PAIN MANAGEMENT) ×3 IMPLANT
WATER STERILE IRR 1000ML POUR (IV SOLUTION) ×6 IMPLANT

## 2018-03-08 NOTE — Anesthesia Procedure Notes (Signed)
Central Venous Catheter Insertion Performed by: Roderic Palau, MD, anesthesiologist Start/End2/18/2020 7:07 AM, 03/08/2018 7:17 AM Patient location: Pre-op. Preanesthetic checklist: patient identified, IV checked, site marked, risks and benefits discussed, surgical consent, monitors and equipment checked, pre-op evaluation, timeout performed and anesthesia consent Position: Trendelenburg Lidocaine 1% used for infiltration and patient sedated Hand hygiene performed , maximum sterile barriers used  and Seldinger technique used Catheter size: 8 Fr Total catheter length 16. Central line was placed.Double lumen Procedure performed without using ultrasound guided technique. Attempts: 1 Following insertion, dressing applied, line sutured and Biopatch. Post procedure assessment: blood return through all ports  Patient tolerated the procedure well with no immediate complications.

## 2018-03-08 NOTE — Progress Notes (Signed)
Pre Procedure note for inpatients:   Rhonda Hughes has been scheduled for Procedure(s): VIDEO ASSISTED THORACOSCOPY (VATS)/ LOBECTOMY (Left) today. The various methods of treatment have been discussed with the patient. After consideration of the risks, benefits and treatment options the patient has consented to the planned procedure.   The patient has been seen and labs reviewed. There are no changes in the patient's condition to prevent proceeding with the planned procedure today.  Recent labs:  Lab Results  Component Value Date   WBC 10.9 (H) 03/03/2018   HGB 11.6 (L) 03/03/2018   HCT 35.9 (L) 03/03/2018   PLT 222 03/03/2018   GLUCOSE 98 03/08/2018   ALT 21 03/03/2018   AST 22 03/03/2018   NA 142 03/08/2018   K 4.0 03/08/2018   CL 105 03/08/2018   CREATININE 0.93 03/08/2018   BUN 8 03/08/2018   CO2 30 03/08/2018   TSH 0.467 04/24/2015   INR 0.92 03/03/2018   HGBA1C 5.8 (H) 09/17/2017    Len Childs, MD 03/08/2018 7:57 AM

## 2018-03-08 NOTE — Transfer of Care (Signed)
Immediate Anesthesia Transfer of Care Note  Patient: Rhonda Hughes  Procedure(s) Performed: VIDEO ASSISTED THORACOSCOPY (VATS)/ LOBECTOMY (Left Chest)  Patient Location: PACU  Anesthesia Type:General  Level of Consciousness: drowsy and patient cooperative  Airway & Oxygen Therapy: Patient Spontanous Breathing and Patient connected to face mask oxygen  Post-op Assessment: Report given to RN, Post -op Vital signs reviewed and stable and Patient moving all extremities X 4  Post vital signs: Reviewed and stable  Last Vitals:  Vitals Value Taken Time  BP 113/68 03/08/2018 12:35 PM  Temp    Pulse 69 03/08/2018 12:38 PM  Resp 17 03/08/2018 12:38 PM  SpO2 97 % 03/08/2018 12:38 PM  Vitals shown include unvalidated device data.  Last Pain:  Vitals:   03/08/18 0600  TempSrc:   PainSc: 0-No pain         Complications: No apparent anesthesia complications

## 2018-03-08 NOTE — Brief Op Note (Addendum)
03/08/2018  12:11 PM  PATIENT:  Rhonda Hughes  78 y.o. female  PRE-OPERATIVE DIAGNOSIS:  LUL LUNG CANCER  POST-OPERATIVE DIAGNOSIS:  LUL LUNG CANCER  PROCEDURE:  Procedure(s): VIDEO ASSISTED THORACOSCOPY (VATS)/ LOBECTOMY (Left)  SURGEON:  Surgeon(s) and Role:    Ivin Poot, MD - Primary  PHYSICIAN ASSISTANT:  Nicholes Rough, PA-C   ANESTHESIA:   general  EBL:  100 mL   BLOOD ADMINISTERED:none  DRAINS: ONE STRAIGHT CHEST TUBE AND ONE BLAKE CHEST TUBE   LOCAL MEDICATIONS USED:  OTHER ON-Q  SPECIMEN:  Source of Specimen:  left upper lung  DISPOSITION OF SPECIMEN:  PATHOLOGY  COUNTS:  YES  DICTATION: .Dragon Dictation  PLAN OF CARE: Admit to inpatient   PATIENT DISPOSITION:  ICU - intubated and hemodynamically stable.   Delay start of Pharmacological VTE agent (>24hrs) due to surgical blood loss or risk of bleeding: no

## 2018-03-08 NOTE — Anesthesia Postprocedure Evaluation (Signed)
Anesthesia Post Note  Patient: Rhonda Hughes  Procedure(s) Performed: VIDEO ASSISTED THORACOSCOPY (VATS)/ LOBECTOMY (Left Chest)     Patient location during evaluation: PACU Anesthesia Type: General Level of consciousness: awake and alert Pain management: pain level controlled Vital Signs Assessment: post-procedure vital signs reviewed and stable Respiratory status: spontaneous breathing, nonlabored ventilation, respiratory function stable and patient connected to nasal cannula oxygen Cardiovascular status: blood pressure returned to baseline and stable Postop Assessment: no apparent nausea or vomiting Anesthetic complications: no    Last Vitals:  Vitals:   03/08/18 1354 03/08/18 1406  BP:  (!) 109/43  Pulse:  60  Resp: 13 12  Temp:    SpO2: 99% 94%    Last Pain:  Vitals:   03/08/18 1354  TempSrc:   PainSc: 0-No pain                 Torah Pinnock,W. EDMOND

## 2018-03-08 NOTE — Anesthesia Procedure Notes (Addendum)
Procedure Name: Intubation Date/Time: 03/08/2018 8:03 AM Performed by: Orlie Dakin, CRNA Pre-anesthesia Checklist: Patient identified, Patient being monitored, Suction available and Emergency Drugs available Patient Re-evaluated:Patient Re-evaluated prior to induction Oxygen Delivery Method: Circle system utilized Preoxygenation: Pre-oxygenation with 100% oxygen Induction Type: IV induction Ventilation: Mask ventilation without difficulty Laryngoscope Size: Mac and 3 Grade View: Grade I Tube type: Oral Endobronchial tube: Double lumen EBT and Left and 37 Fr Number of attempts: 1 Airway Equipment and Method: Stylet Placement Confirmation: ETT inserted through vocal cords under direct vision,  positive ETCO2 and breath sounds checked- equal and bilateral Secured at: 27 cm Tube secured with: Tape Dental Injury: Teeth and Oropharynx as per pre-operative assessment  Comments: DVL and EBT insertion performed by Harlow Ohms SRNA>     37 Fr Vivasight used. SRNA supervised by Vania Rea CRNA.

## 2018-03-08 NOTE — Op Note (Signed)
NAME: Rhonda Hughes, Rhonda Hughes MEDICAL RECORD [id number] ACCOUNT [id number] DATE OF BIRTH:1940-12-10 FACILITY: [facility] LOCATION: [facility] PHYSICIAN:Cannon Arreola VAN TRIGT III, MD  OPERATIVE REPORT  DATE OF PROCEDURE:  03/08/2018  OPERATION: 1.  Left VATS (video-assisted thoracoscopic surgery) with left upper lobectomy. 2.  Mediastinal lymph node dissection. 3.  Placement of On-Q wound analgesia system.  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  CLINICAL NOTE:  The patient is a 78 year old female with a strong history of smoking and COPD.  She presented with a non-STEMI in late 2019 and had urgent CABG x3.  Her chest x-ray at that time showed a left upper lobe nodule, which was confirmed by CT  scan.  After she recovered from her cardiac surgery, which took about 3 months, we further evaluated the lung nodule.  On PET scan, it was hypermetabolic.  There was no evidence of metastatic disease.  Brain scan was negative.  PFTs were not optimal but  adequate for a lobectomy.  A biopsy was performed, which was read as small cell carcinoma of the lung.  I discussed the procedure of VATS and lobectomy with the patient in the office on several occasions.  She understood the reasons for the surgery, the location of the incision, and the expected recovery, as well as the potential risks including bleeding,  blood transfusion, prolonged air leak, postoperative respiratory insufficiency, postoperative pleural effusion, postoperative air leak, postoperative organ failure and death.  After reviewing these issues, she demonstrated her understanding and agreed to  proceed with surgery under what I felt was an informed consent.  OPERATIVE FINDINGS: 1.  Dense adhesions which obliterated most of  the upper pleural space from the previous CABG and mammary artery harvest.  The mammary artery pedicle was adherent to a portion of the medial aspect of the left upper lobe. 2.  Severe emphysema, very thin and fragile lungs. 3.  Frozen section of the specimen in the left upper lobectomy consistent with small cell carcinoma of the lung, bronchial margins clear.  DESCRIPTION OF PROCEDURE:  The patient was brought to the operating room and placed supine on the operating table.  General anesthesia was induced.  The patient was turned left side up.  A double-lumen tube had been placed by the anesthesia team.  The  left chest was prepped and draped as a sterile field.  A proper time-out was performed.  A small incision was made at the tip of the scapula, and the VATS camera was inserted.  The lung was hyperexpanded consistent with emphysema, and there were multiple  adhesions at the apex of the anterior pleural space and the posterior pleural space.  A separate incision was made in the 5th interspace anterior to the scapula.  The ribs were gently spread.  Over the next 1-2 hours, a tedious dissection was performed to free up the left upper lobe from the adhesions to the chest wall, the mammary artery  pedicle, the pericardium, and the diaphragm.  After the left upper lobe had been mobilized, the mass was easily palpable.  It was in the central portion of the lobe, and lobectomy was necessary for an adequate resection.  Next, the pulmonary artery was dissected out of the hilum, and the branches in the upper lobe were  dissected carefully, encircled, and then stapled and divided with the Echelon stapler.  The fissure was complete.  It was divided with the Echelon stapler,  exposing the main pulmonary artery.  The branches to the lower lobe were preserved, and the remaining branches in the upper lobe were ligated and divided with a stapler.  The upper lobe pulmonary vein likewise had been dissected to the hilum  and stapled and divided.  Next, the bronchus was isolated and a stapler was placed across the upper lobe bronchus and clamped.  The lung was ventilated, and the lower lobe expanded nicely.  The stapler was fired and the bronchus was divided.  The specimen was removed.  Hemostasis was achieved.  The pleural space was irrigated.  Anterior and posterior chest tubes were placed and brought out through separate incisions.  Pericostal sutures were placed to reapproximate the ribs.  The lung was reexpanded under direct  vision.  The pericostal sutures were tied.  The muscle layer was closed with interrupted #1 Vicryl.  The subcutaneous and skin layers were closed with a running Vicryl.  The chest tubes were secured, and the On-Q catheter was placed in the chest wall  muscle between the chest tube exit site and the main incision.  This was connected to a reservoir of 0.5% Marcaine.  Sterile dressings were applied.  The patient was then turned supine and anesthesia was reversed.  The patient was extubated and returned  to the recovery room in stable condition.  LN/NUANCE  D:03/08/2018 T:03/08/2018 JOB:[id number]

## 2018-03-08 NOTE — Anesthesia Procedure Notes (Addendum)
Arterial Line Insertion Start/End2/18/2020 6:40 AM, 03/08/2018 6:47 AM Performed by: Orlie Dakin, CRNA, CRNA  Patient location: Pre-op. Preanesthetic checklist: patient identified, IV checked, risks and benefits discussed, monitors and equipment checked and pre-op evaluation Lidocaine 1% used for infiltration Left, radial was placed Catheter size: 20 G Hand hygiene performed , maximum sterile barriers used  and Seldinger technique used Allen's test indicative of satisfactory collateral circulation Attempts: 1 Procedure performed without using ultrasound guided technique. Ultrasound Notes:anatomy identified, needle tip was noted to be adjacent to the nerve/plexus identified and no ultrasound evidence of intravascular and/or intraneural injection Following insertion, dressing applied and Biopatch. Post procedure assessment: normal  Patient tolerated the procedure well with no immediate complications. Additional procedure comments: Arterial line insertion performed by Harlow Ohms SRNA under Vania Rea CRNA supervision. Marland Kitchen

## 2018-03-09 ENCOUNTER — Encounter (HOSPITAL_COMMUNITY): Payer: Self-pay | Admitting: Cardiothoracic Surgery

## 2018-03-09 ENCOUNTER — Inpatient Hospital Stay (HOSPITAL_COMMUNITY): Payer: Medicare Other

## 2018-03-09 LAB — CBC
HCT: 34.7 % — ABNORMAL LOW (ref 36.0–46.0)
Hemoglobin: 11.2 g/dL — ABNORMAL LOW (ref 12.0–15.0)
MCH: 31.9 pg (ref 26.0–34.0)
MCHC: 32.3 g/dL (ref 30.0–36.0)
MCV: 98.9 fL (ref 80.0–100.0)
Platelets: 221 10*3/uL (ref 150–400)
RBC: 3.51 MIL/uL — AB (ref 3.87–5.11)
RDW: 14.6 % (ref 11.5–15.5)
WBC: 16.8 10*3/uL — ABNORMAL HIGH (ref 4.0–10.5)
nRBC: 0 % (ref 0.0–0.2)

## 2018-03-09 LAB — POCT I-STAT 7, (LYTES, BLD GAS, ICA,H+H)
Acid-Base Excess: 5 mmol/L — ABNORMAL HIGH (ref 0.0–2.0)
Bicarbonate: 29.1 mmol/L — ABNORMAL HIGH (ref 20.0–28.0)
Calcium, Ion: 1.18 mmol/L (ref 1.15–1.40)
HCT: 31 % — ABNORMAL LOW (ref 36.0–46.0)
Hemoglobin: 10.5 g/dL — ABNORMAL LOW (ref 12.0–15.0)
O2 Saturation: 97 %
Patient temperature: 98.6
Potassium: 3.1 mmol/L — ABNORMAL LOW (ref 3.5–5.1)
Sodium: 136 mmol/L (ref 135–145)
TCO2: 30 mmol/L (ref 22–32)
pCO2 arterial: 39.6 mmHg (ref 32.0–48.0)
pH, Arterial: 7.474 — ABNORMAL HIGH (ref 7.350–7.450)
pO2, Arterial: 82 mmHg — ABNORMAL LOW (ref 83.0–108.0)

## 2018-03-09 LAB — BASIC METABOLIC PANEL
ANION GAP: 14 (ref 5–15)
BUN: 7 mg/dL — ABNORMAL LOW (ref 8–23)
CHLORIDE: 96 mmol/L — AB (ref 98–111)
CO2: 27 mmol/L (ref 22–32)
Calcium: 9 mg/dL (ref 8.9–10.3)
Creatinine, Ser: 0.84 mg/dL (ref 0.44–1.00)
GFR calc Af Amer: >60 mL/min (ref >60)
GFR calc non Af Amer: >60 mL/min (ref >60)
Glucose, Bld: 107 mg/dL — ABNORMAL HIGH (ref 70–99)
Potassium: 3.1 mmol/L — ABNORMAL LOW (ref 3.5–5.1)
Sodium: 137 mmol/L (ref 135–145)

## 2018-03-09 LAB — GLUCOSE, CAPILLARY
Glucose-Capillary: 118 mg/dL — ABNORMAL HIGH (ref 70–99)
Glucose-Capillary: 114 mg/dL — ABNORMAL HIGH (ref 70–99)
Glucose-Capillary: 127 mg/dL — ABNORMAL HIGH (ref 70–99)
Glucose-Capillary: 104 mg/dL — ABNORMAL HIGH (ref 70–99)
Glucose-Capillary: 131 mg/dL — ABNORMAL HIGH (ref 70–99)

## 2018-03-09 MED ORDER — INSULIN ASPART 100 UNIT/ML ~~LOC~~ SOLN
0.0000 [IU] | Freq: Three times a day (TID) | SUBCUTANEOUS | Status: DC
  Administered 2018-03-09: 2 [IU] via SUBCUTANEOUS
  Administered 2018-03-13: 3 [IU] via SUBCUTANEOUS

## 2018-03-09 MED ORDER — SODIUM CHLORIDE 0.9% FLUSH
10.0000 mL | Freq: Two times a day (BID) | INTRAVENOUS | Status: DC
  Administered 2018-03-09 – 2018-03-14 (×11): 10 mL
  Administered 2018-03-15: 3 mL
  Administered 2018-03-15 – 2018-03-19 (×7): 10 mL

## 2018-03-09 MED ORDER — INSULIN ASPART 100 UNIT/ML ~~LOC~~ SOLN: 0.0000 [IU] | Freq: Every day | SUBCUTANEOUS | Status: DC

## 2018-03-09 MED ORDER — CHLORHEXIDINE GLUCONATE CLOTH 2 % EX PADS
6.0000 | MEDICATED_PAD | Freq: Every day | CUTANEOUS | Status: DC
  Administered 2018-03-09 – 2018-03-15 (×7): 6 via TOPICAL

## 2018-03-09 MED ORDER — ASPIRIN 325 MG PO TABS
325.0000 mg | ORAL_TABLET | Freq: Every day | ORAL | Status: DC
  Administered 2018-03-09 – 2018-03-19 (×11): 325 mg via ORAL
  Filled 2018-03-09 (×11): qty 1

## 2018-03-09 MED ORDER — LABETALOL HCL 5 MG/ML IV SOLN: 10.0000 mg | INTRAVENOUS | Status: DC | PRN

## 2018-03-09 MED ORDER — POTASSIUM CHLORIDE 10 MEQ/50ML IV SOLN
10.0000 meq | INTRAVENOUS | Status: AC
  Administered 2018-03-09 (×3): 10 meq via INTRAVENOUS
  Filled 2018-03-09 (×3): qty 50

## 2018-03-09 MED ORDER — SODIUM CHLORIDE 0.9% FLUSH
10.0000 mL | INTRAVENOUS | Status: DC | PRN
  Administered 2018-03-17: 10 mL
  Filled 2018-03-09: qty 40

## 2018-03-09 NOTE — Progress Notes (Deleted)
RT Note:  Asked patient about CPAP for tonight.  Patient stated that she does not use a CPAP at home and is breathing fine.  Patient refused CPAP, will continue to monitor.  No distress noted.

## 2018-03-09 NOTE — Discharge Summary (Addendum)
[address]       [place] [address]             [phone]      Physician Discharge Summary  Patient ID: Rhonda Hughes MRN: [id number] DOB/AGE: [birth year] 76 y.o.  Admit date: 03/08/2018 Discharge date: 03/19/2018  Admission Diagnoses: Patient Active Problem List   Diagnosis Date Noted  . Lung cancer ([place]) 02/07/2018  . Goals of care, counseling/discussion 01/13/2018  . Chest pain with moderate risk for cardiac etiology 09/17/2017  . Lung nodule 09/17/2017  . Liver nodule 09/17/2017  . Unstable angina ([place]) 09/16/2017  . Benign essential HTN   . AKI (acute kidney injury) ([place]) 05/16/2017  . Dehydration symptoms 05/16/2017  . Acute kidney injury ([place]) 05/16/2017  . Constipation 05/12/2017  . Coronary artery disease involving native coronary artery of native heart with unstable angina pectoris ([place])   . Esophageal reflux   . CAD (coronary artery disease), native coronary artery 04/24/2015  . Hyponatremia 04/24/2015  . Pain in the chest   . Exertional angina ([place]) 03/08/2012  . Crescendo angina ([place]) 03/08/2012  . Hypokalemia 03/08/2012  . HTN (hypertension)   . Chest pain, atypical 10/13/2011  . At risk for coronary artery disease 10/13/2011  . 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 Barrett's esophagus 02/25/2011  . Rectal pressure 06/18/2010  . Bowel habit changes 06/18/2010    Discharge Diagnoses:  Active Problems:   Lung nodule   Discharged Condition: good  HPI:   78 year old female reformed smoker with biopsy-proven left upper lobe lung cancer.  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  PET scan shows no regional or distant metastatic disease and brain scan is negative.  She reported for surgery about 2 weeks ago but had a severe upper respiratory infection with productive cough wheezing congestion and her surgery was canceled.  Her white count was elevated and she  was placed on antibiotics followed by prednisone taper.  She had not been smoking.  She presents today for reexam and to discuss scheduling for surgery.  September 2019 she had urgent CABG x3 for unstable angina.  Her LV function was good.  At that time the nodule in the left upper lobe was noted.  Currently she states she is feeling much improved.  She has finished her course of prednisone and antibiotics.  Her appetite and overall strength are improved.  She still has a little bit of hoarseness.  Weight is stable.  She appears to be clinically ready for left VATS and pulmonary section about 2 weeks.    Hospital Course:   On 03/08/2018 she underwent left VATs with the left upper lobectomy, mediastinal lymph node dissection, and placement of On-Q wound analgesia with Dr. Prescott Gum. She was transferred to the surgical ICU in stable condition.  She was extubated in a timely manner. POD 1 remains in NSR. Her CXR showed that there was expected left volume loss. We kept her chest tube in place due to an air leak. We discontinued her PCA since she did not need it and her arterial line. We also discontinued her foley catheter. She was tolerating 2L [place] with good oxygen support and we were weaning as able.  She was stable to transfer for continued care. She continued to have an air leak but we removed the posterior chest tube on 2/21. She did have 2 episodes which was assumed to be  vaso-vagal where the patient lost consciousness for a brief moment. She does not remember any prodrome. Her blood pressure and heart rate remained stable throughout the occurrence. We continued suction on the left anterior chest tube.She had no further syncopal episodes. She was feeling stronger and walking in the halls but she remained unsteady on her feet. POD 6 she continued to progress. We decreased the suction on the remaining chest tube to 10 cm. Her CXR remained stable. She was encouraged to continue to ambulate in the halls and  use her incentive spirometer. POD 7 she continued to work on oral intake since her appetite was not quite back yet. She still has an air leak but the tube did not have much drainage. Chest x ray showed slight decrease in left pneumothorax and left base atelectasis. She remained on room air with good saturation levels. Chest tube was placed to water seal on 02/28. CXR showed stable appearance of pneumothorax. Chest tube was placed to [facility]. Patient is felt surgically stable for discharge today.   Consults: None  Significant Diagnostic Studies:   CLINICAL DATA:  Lung nodule  EXAM: PORTABLE CHEST 1 VIEW  COMPARISON:  Yesterday  FINDINGS: Increased left apical pneumothorax, ~ 20%. New chest wall emphysema. One of the chest tubes has rotated more medially. Left subclavian line with tip at the SVC. The right lung is clear. Normal heart size. CABG and coronary stenting.  IMPRESSION: 1. Increased left pneumothorax, approximately 20%. 2. One of the left chest tubes has rotated.   Electronically Signed   By: Monte Fantasia M.D.   On: 03/09/2018 09:13   Treatments:   NAME: Rhonda Hughes MEDICAL RECORD [id number] ACCOUNT [id number] DATE OF BIRTH:04-16-1940 [facility]AC[facility] LOCATION: [facility] PHYSICIAN:Rhonda Phegley VAN TRIGT III, MD  OPERATIVE REPORT  DATE OF PROCEDURE:  03/08/2018  OPERATION: 1.  Left VATS (video-assisted thoracoscopic surgery) with left upper lobectomy. 2.  Mediastinal lymph node dissection. 3.  Placement of On-Q wound analgesia system.  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   Discharge Exam: Blood pressure (!) 117/55, pulse 88, temperature 98.7 F (37.1 C), temperature source Oral, resp. rate 16, height 5'  1.5" (1.562 m), weight 51.2 kg, SpO2 93 %.   General appearance: alert, cooperative and no distress Heart: regular rate and rhythm Lungs: clear to auscultation bilaterally Abdomen: soft, non-tender; bowel sounds normal; no masses,  no organomegaly Wound: clean and dry  Discharge disposition: 01-Home or Self Care   Allergies as of 03/19/2018      Reactions   Gadolinium Derivatives Hives, Itching   Pt began having hives and itching about 5-7 minutes after contrast administered. Dr Weber Cooks evaluated pt and had Katie RN administer 50 mg Benedryl by IV. Dr Weber Cooks cleared pt to go home.   Adhesive [tape] Other (See Comments)   Turns skin red   Sulfa Antibiotics Other (See Comments)   Childhood allergy      Medication List    STOP taking these medications   predniSONE 5 MG tablet Commonly known as:  DELTASONE     TAKE these medications   acetaminophen 325 MG tablet Commonly known as:  TYLENOL Take 2 tablets (650 mg total) by mouth every 6 (six) hours as needed for mild pain (or  Fever >/= 101). What changed:  how much to take   ALPRAZolam 0.25 MG tablet Commonly known as:  XANAX Take 0.25 mg by mouth 2 (two) times daily as needed for anxiety.   aspirin 325 MG tablet Take 325 mg by mouth daily.   atorvastatin 20 MG tablet Commonly known as:  LIPITOR Take 1 tablet (20 mg total) by mouth at bedtime.   lisinopril 5 MG tablet Commonly known as:  PRINIVIL,ZESTRIL Take 1 tablet (5 mg total) by mouth daily. What changed:  when to take this   LUBRICATING EYE DROPS OP Place 1 drop into both eyes daily as needed (dry eyes).   magnesium oxide 400 (241.3 Mg) MG tablet Commonly known as:  MAG-OX TAKE 1/2 (ONE-HALF) TABLET BY MOUTH ONCE DAILY What changed:  See the new instructions.   memantine 10 MG tablet Commonly known as:  NAMENDA Take 10 mg by mouth 2 (two) times daily.   metoprolol tartrate 25 MG tablet Commonly known as:  LOPRESSOR Take 0.5 tablets (12.5 mg total) by  mouth 2 (two) times daily.   nitroGLYCERIN 0.4 MG SL tablet Commonly known as:  NITROSTAT Place 1 tablet (0.4 mg total) under the tongue every 5 (five) minutes as needed for chest pain.   pantoprazole 20 MG tablet Commonly known as:  PROTONIX Take 20 mg by mouth daily.   potassium chloride 20 MEQ packet Commonly known as:  KLOR-CON Take 20 mEq by mouth 4 (four) times daily.   traMADol 50 MG tablet Commonly known as:  ULTRAM Take 1-2 tablets (50-100 mg total) by mouth every 6 (six) hours as needed (mild pain).   venlafaxine XR 37.5 MG 24 hr capsule Commonly known as:  EFFEXOR-XR Take 37.5 mg by mouth at bedtime.   zolpidem 10 MG tablet Commonly known as:  AMBIEN Take 10 mg by mouth at bedtime as needed for sleep.      Follow-up Information    Ivin Poot, MD Follow up.   Specialty:  Cardiothoracic Surgery Why:  Your appointment is on 03/23/2018 at 4pm. Please arrive at 3:30pm for a chest xray located at [facility] which is on the first floor of our building.  Contact information: [address] [place] [address] [phone]           Signed:  Original Note By Rhonda Rough PA-C  Erin Barrett  PA-C 03/19/2018, 8:41 AM   patient examined and medical record reviewed,agree with above note. Rhonda Hughes 03/21/2018

## 2018-03-09 NOTE — Progress Notes (Signed)
RT Note:  No Bipap in patient room.  Patient stated that she is breathing fine.  Will continue to monitor.

## 2018-03-09 NOTE — Progress Notes (Signed)
42mL of Fentanyl PCA syringe wasted. Witnessed with Beckie Salts, RN.

## 2018-03-09 NOTE — Progress Notes (Addendum)
[facility] DAILY ICU PROGRESS NOTE                   [address]            [place] [address]          [phone]   1 Day Post-Op Procedure(s) (LRB): VIDEO ASSISTED THORACOSCOPY (VATS)/ LOBECTOMY (Left)  Total Length of Stay:  LOS: 1 day   Subjective: Feels good this morning. She isn't having any pain.   Objective: Vital signs in last 24 hours: Temp:  [97.3 F (36.3 C)-98.7 F (37.1 C)] 98.7 F (37.1 C) (02/19 0719) Pulse Rate:  [56-86] 68 (02/19 0700) Cardiac Rhythm: Normal sinus rhythm (02/19 0743) Resp:  [8-24] 18 (02/19 0743) BP: (76-174)/(36-86) 136/71 (02/19 0700) SpO2:  [88 %-100 %] 100 % (02/19 0743) Arterial Line BP: (85-195)/(54-150) 116/66 (02/19 0700)  Filed Weights   03/08/18 0536 03/08/18 0543  Weight: 51.3 kg 51.2 kg    Weight change:       Intake/Output from previous day: 02/18 0701 - 02/19 0700 In: 3584.1 [P.O.:360; I.V.:3124.1; IV Piggyback:100] Out: 4908 [Urine:4450; Blood:100; Chest Tube:358]  Intake/Output this shift: Total I/O In: -  Out: 105 [Urine:75; Chest Tube:30]  Current Meds: Scheduled Meds: . acetaminophen  1,000 mg Oral Q6H   Or  . acetaminophen (TYLENOL) oral liquid 160 mg/5 mL  1,000 mg Oral Q6H  . atorvastatin  20 mg Oral QHS  . bisacodyl  10 mg Oral Daily  . Chlorhexidine Gluconate Cloth  6 each Topical Daily  . enoxaparin (LOVENOX) injection  40 mg Subcutaneous Daily  . fentaNYL   Intravenous Q4H  . insulin aspart  0-24 Units Subcutaneous Q4H  . levalbuterol  0.63 mg Nebulization TID  . mouth rinse  15 mL Mouth Rinse BID  . senna-docusate  1 tablet Oral QHS  . sodium chloride flush  10-40 mL Intracatheter Q12H  . venlafaxine XR  37.5 mg Oral QHS   Continuous Infusions: . bupivacaine ON-Q pain pump    . dextrose 5 % and 0.45% NaCl 100 mL/hr at 03/09/18 0727  . potassium chloride     PRN Meds:.ALPRAZolam, diphenhydrAMINE **OR** diphenhydrAMINE, labetalol, naloxone **AND** sodium chloride flush,  ondansetron (ZOFRAN) IV, oxyCODONE, potassium chloride, sodium chloride flush, traMADol  General appearance: alert, cooperative and no distress Heart: regular rate and rhythm, S1, S2 normal, no murmur, click, rub or gallop Lungs: clear to auscultation bilaterally Abdomen: soft, non-tender; bowel sounds normal; no masses,  no organomegaly Extremities: extremities normal, atraumatic, no cyanosis or edema Wound: clean and dry, dressed with sterile dressing  Lab Results: CBC: Recent Labs    03/09/18 0446 03/09/18 0550  WBC  --  16.8*  HGB 10.5* 11.2*  HCT 31.0* 34.7*  PLT  --  221   BMET:  Recent Labs    03/08/18 0617 03/09/18 0446 03/09/18 0550  NA 142 136 137  K 4.0 3.1* 3.1*  CL 105  --  96*  CO2 30  --  27  GLUCOSE 98  --  107*  BUN 8  --  7*  CREATININE 0.93  --  0.84  CALCIUM 9.4  --  9.0    CMET: Lab Results  Component Value Date   WBC 16.8 (H) 03/09/2018   HGB 11.2 (L) 03/09/2018   HCT 34.7 (L) 03/09/2018   PLT 221 03/09/2018   GLUCOSE 107 (H) 03/09/2018   ALT 21 03/03/2018   AST 22 03/03/2018   NA 137 03/09/2018  K 3.1 (L) 03/09/2018   CL 96 (L) 03/09/2018   CREATININE 0.84 03/09/2018   BUN 7 (L) 03/09/2018   CO2 27 03/09/2018   TSH 0.467 04/24/2015   INR 0.92 03/03/2018   HGBA1C 5.8 (H) 09/17/2017      PT/INR: No results for input(s): LABPROT, INR in the last 72 hours. Radiology: Dg Chest Port 1 View  Result Date: 03/08/2018 CLINICAL DATA:  Status post lung surgery EXAM: PORTABLE CHEST 1 VIEW COMPARISON:  03/08/2018, 02/23/2018, 02/07/2018 FINDINGS: Post sternotomy changes. Interim placement of left-sided central venous catheter with tip overlying the SVC. Placement of left-sided chest drainage catheters with tips overlying the left apex. New surgical sutures in the left upper lung and left perihilar region with multiple surgical clips. Probable stent material over the left cardiac silhouette. No discrete pneumothorax. Probable left displaced fourth  rib fracture. Aortic atherosclerosis. IMPRESSION: 1. Interim postsurgical changes of the left lung with volume loss and mediastinal shift to the left. Placement of left-sided chest tubes and left-sided central venous catheter as above. 2. Suspected acute left displaced fourth rib fracture. Electronically Signed   By: Donavan Foil M.D.   On: 03/08/2018 15:05     Assessment/Plan: S/P Procedure(s) (LRB): VIDEO ASSISTED THORACOSCOPY (VATS)/ LOBECTOMY (Left)   1. CV-BP well controlled. Remains in NSR in the 80s. Continue statin, add ASA.  2. Pulm-left lung volume loss on CXR which is expected. Shift appears better on today's CXR. Chest tubes remain in place. ++airleak-keep 3. Renal-creatinine 0.84, hypokalemia-replace per protocol 4. H and H 11.2/34.7 5. Endo-last CBGs 246, 118, 114-continue SSI. Will add Levemir if remains high 6. Continue Lovenox for DVT prophylaxis  Plan: Keep chest tubes due to air leak. Suction reduced to 15cm. Remove arterial line and foley catheter. Ambulate around the room. OOB to chair. Advance diet as tolerated. Discontinue PCA since patient does not use.   Elgie Collard 03/09/2018 8:58 AM   Patient can move to progressive status unit 2 Central

## 2018-03-09 NOTE — H&P (Signed)
PCP is System, Provider Not In Referring Provider is No ref. provider found  No chief complaint on file.   HPI: 78 year old female reformed smoker with biopsy-proven left upper lobe lung cancer.  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  PET scan shows no regional or distant metastatic disease and brain scan is negative.  She reported for surgery about 2 weeks ago but had a severe upper respiratory infection with productive cough wheezing congestion and her surgery was canceled.  Her white count was elevated and she was placed on antibiotics followed by prednisone taper.  She had not been smoking.  She presents today for reexam and to discuss scheduling for surgery.  September 2019 she had urgent CABG x3 for unstable angina.  Her LV function was good.  At that time the nodule in the left upper lobe was noted.  Currently she states she is feeling much improved.  She has finished her course of prednisone and antibiotics.  Her appetite and overall strength are improved.  She still has a little bit of hoarseness.  Weight is stable.  She appears to be clinically ready for left VATS and pulmonary section about 2 weeks.     Past Medical History:  Diagnosis Date  . AKI (acute kidney injury) ([place]) 05/16/2017  . Anxiety   . Barrett's esophagus without dysplasia   . CAD (coronary artery disease)    a. LHC 10/16/11: dLM 20%, mLAD 90%, pOM1 30%, mOM1 70% (FFR not hemodynamically significant), prox and mid RCA 30%, EF 65%;  b. PCI 10/16/11: Promus DES x 2 to mLAD  . Cancer ([place])    left upper lobe  . COPD (chronic obstructive pulmonary disease) ([place])   . Depression   . Diverticulosis   . Essential hypertension   . Family history of adverse reaction to anesthesia    mother had "breathing problems"  . GERD (gastroesophageal reflux disease)   . Hyperlipidemia   . Rectocele     Past Surgical History:  Procedure Laterality Date  . ABDOMINAL HYSTERECTOMY    . CARDIAC  CATHETERIZATION    . CARDIAC CATHETERIZATION N/A 06/21/2015   Procedure: Right/Left Heart Cath and Coronary Angiography;  Surgeon: [name], MD;  Location: [facility];  Service: Cardiovascular;  Laterality: N/A;  . CARDIAC CATHETERIZATION  09/16/2017  . CATARACT EXTRACTION W/ INTRAOCULAR LENS  IMPLANT, BILATERAL Bilateral   . COLONOSCOPY  01/2008   Multiple diverticula in desc and sigm colon  . CORONARY ARTERY BYPASS GRAFT N/A 09/24/2017   Procedure: CORONARY ARTERY BYPASS GRAFTING (CABG) x4 using mammary artery and saphenous vein. LIMA to LAD, SVG to RCA, SVG to RAMUS, SVT to OM. Endoscopic saphenous vein harvest.;  Surgeon: Ivin Poot, MD;  Location: [facility];  Service: Open Heart Surgery;  Laterality: N/A;  . ESOPHAGOGASTRODUODENOSCOPY  01/2008   Barrett's esophagus, gastritis, no h.pylori, due follow-up 01/2011  . ESOPHAGOGASTRODUODENOSCOPY  04/06/2011   Procedure: ESOPHAGOGASTRODUODENOSCOPY (EGD);  Surgeon: Danie Binder, MD;  [facility];  Service: Endoscopy;  Laterality: N/A;  8:30  . FLEXIBLE SIGMOIDOSCOPY  08/11/2010 RECTAL PAIN/PRESSURE   SML IH, TICS  . FRACTIONAL FLOW RESERVE WIRE N/A 10/16/2011   Procedure: FRACTIONAL FLOW RESERVE WIRE;  Surgeon: [name], MD;  Location: [facility] CATH LAB;  Service: Cardiovascular;  Laterality: N/A;  . IR PERC PLEURAL DRAIN W/INDWELL CATH W/IMG GUIDE  10/01/2017  . LEFT HEART CATH AND CORONARY ANGIOGRAPHY N/A 09/16/2017   Procedure: LEFT HEART CATH AND  CORONARY ANGIOGRAPHY;  Surgeon: [name], MD;  Location: [facility];  Service: Cardiovascular;  Laterality: N/A;  . LEFT HEART CATHETERIZATION WITH CORONARY ANGIOGRAM N/A 03/07/2012   Procedure: LEFT HEART CATHETERIZATION WITH CORONARY ANGIOGRAM;  Surgeon: [name], MD;  Location: [facility] CATH LAB;  Service: Cardiovascular;  Laterality: N/A;  . LEFT HEART CATHETERIZATION WITH CORONARY ANGIOGRAM N/A 12/14/2012   Procedure: LEFT HEART  CATHETERIZATION WITH CORONARY ANGIOGRAM;  Surgeon: [name], MD;  Location: [facility] CATH LAB;  Service: Cardiovascular;  Laterality: N/A;  . LEFT HEART CATHETERIZATION WITH CORONARY ANGIOGRAM N/A 05/07/2014   Procedure: LEFT HEART CATHETERIZATION WITH CORONARY ANGIOGRAM;  Surgeon: [name], MD;  Location: [facility] CATH LAB;  Service: Cardiovascular;  Laterality: N/A;  . PARTIAL HYSTERECTOMY  1980s  . TEE WITHOUT CARDIOVERSION N/A 09/24/2017   Procedure: TRANSESOPHAGEAL ECHOCARDIOGRAM (TEE);  Surgeon: Prescott Gum, Collier Salina, MD;  Location: [place];  Service: Open Heart Surgery;  Laterality: N/A;  . VIDEO ASSISTED THORACOSCOPY (VATS)/ LOBECTOMY Left 03/08/2018   Procedure: VIDEO ASSISTED THORACOSCOPY (VATS)/ LOBECTOMY;  Surgeon: Ivin Poot, MD;  Location: [facility] OR;  Service: Thoracic;  Laterality: Left;    Family History  Problem Relation Age of Onset  . Coronary artery disease Father 60  . Heart attack Father 34  . GI problems Mother        Perforated colon   . Coronary artery disease Paternal Aunt   . Coronary artery disease Paternal Uncle   . Colon cancer Neg Hx   . Gastric cancer Neg Hx   . Esophageal cancer Neg Hx     Social History Social History   Tobacco Use  . Smoking status: Former Smoker    Packs/day: 1.00    Years: 53.00    Pack years: 53.00    Types: Cigarettes    Last attempt to quit: 03/20/2007    Years since quitting: 10.9  . Smokeless tobacco: Never Used  Substance Use Topics  . Alcohol use: No  . Drug use: No    Current Facility-Administered Medications  Medication Dose Route Frequency Provider Last Rate Last Dose  . acetaminophen (TYLENOL) tablet 1,000 mg  1,000 mg Oral Q6H Conte, Tessa N, PA-C   1,000 mg at 03/09/18 1141   Or  . acetaminophen (TYLENOL) solution 1,000 mg  1,000 mg Oral Q6H Conte, Tessa N, PA-C      . ALPRAZolam Duanne Moron) tablet 0.25 mg  0.25 mg Oral BID PRN Harriet Pho, Tessa N, PA-C      . aspirin tablet 325 mg  325 mg Oral Daily Nicholes Rough N, PA-C   325 mg at 03/09/18 0940  . atorvastatin (LIPITOR) tablet 20 mg  20 mg Oral QHS Conte, Tessa N, PA-C      . bisacodyl (DULCOLAX) EC tablet 10 mg  10 mg Oral Daily Nicholes Rough N, PA-C   10 mg at 03/09/18 0940  . bupivacaine ON-Q pain pump   Other Continuous Harriet Pho, Tessa N, [place]      . Chlorhexidine Gluconate Cloth 2 % PADS 6 each  6 each Topical Daily Prescott Gum, Collier Salina, MD      . dextrose 5 %-0.45 % sodium chloride infusion   Intravenous Continuous Ivin Poot, MD 10 mL/hr at 03/09/18 1130    . enoxaparin (LOVENOX) injection 40 mg  40 mg Subcutaneous Daily Nicholes Rough N, PA-C   40 mg at 03/09/18 0944  . insulin aspart (novoLOG) injection 0-15 Units  0-15 Units Subcutaneous TID WC  Ivin Poot, MD   2 Units at 03/09/18 1142  . insulin aspart (novoLOG) injection 0-5 Units  0-5 Units Subcutaneous QHS Prescott Gum, Collier Salina, MD      . labetalol (NORMODYNE,TRANDATE) injection 10 mg  10 mg Intravenous Q2H PRN Prescott Gum, Collier Salina, MD      . levalbuterol Penne Lash) nebulizer solution 0.63 mg  0.63 mg Nebulization TID Prescott Gum, Collier Salina, MD   0.63 mg at 03/09/18 1407  . MEDLINE mouth rinse  15 mL Mouth Rinse BID Prescott Gum, Collier Salina, MD   15 mL at 03/09/18 0944  . oxyCODONE (Oxy IR/ROXICODONE) immediate release tablet 5-10 mg  5-10 mg Oral Q4H PRN Conte, Tessa N, PA-C      . potassium chloride 10 mEq in 50 mL *CENTRAL LINE* IVPB  10 mEq Intravenous Daily PRN Conte, Tessa N, PA-C      . senna-docusate (Senokot-S) tablet 1 tablet  1 tablet Oral QHS Elgie Collard, PA-C   1 tablet at 03/08/18 2052  . sodium chloride flush (NS) 0.9 % injection 10-40 mL  10-40 mL Intracatheter Q12H Prescott Gum, Collier Salina, MD   10 mL at 03/09/18 1041  . sodium chloride flush (NS) 0.9 % injection 10-40 mL  10-40 mL Intracatheter PRN Prescott Gum, Collier Salina, MD      . traMADol Veatrice Bourbon) tablet 50-100 mg  50-100 mg Oral Q6H PRN Conte, Tessa N, PA-C      . venlafaxine XR (EFFEXOR-XR) 24 hr capsule 37.5 mg  37.5 mg Oral QHS Conte, Tessa N,  PA-C       Facility-Administered Medications Ordered in Other Encounters  Medication Dose Route Frequency Provider Last Rate Last Dose  . midazolam (VERSED) 5 MG/5ML injection    Anesthesia Intra-op Shirlyn Goltz, CRNA   2 mg at 02/07/18 0715    Allergies  Allergen Reactions  . Gadolinium Derivatives Hives and Itching    Pt began having hives and itching about 5-7 minutes after contrast administered. Dr Weber Cooks evaluated pt and had Katie RN administer 50 mg Benedryl by IV. Dr Weber Cooks cleared pt to go home.  . Adhesive [Tape] Other (See Comments)    Turns skin red  . Sulfa Antibiotics Other (See Comments)    Childhood allergy    Review of Systems   Patient had subcutaneous emphysema after her CABG from COPD. Echocardiogram showed EF 55% She is in sinus rhythm She has had no blood thinners.   BP (!) 173/73   Pulse 72   Temp 98.3 F (36.8 C) (Oral)   Resp 13   Ht 5' 1.5" (1.562 m)   Wt 51.2 kg   SpO2 100%   BMI 20.98 kg/m  Physical Exam      Exam    General- alert and comfortable    Neck- no JVD, no cervical adenopathy palpable, no carotid bruit   Lungs- clear without rales, wheezes   Cor- regular rate and rhythm, no murmur , gallop   Abdomen- soft, non-tender   Extremities - warm, non-tender, minimal edema   Neuro- oriented, appropriate, no focal weakness   Diagnostic Tests: Chest x-ray image performed today personally reviewed.  No evidence of bronchitis. The left upper lobe nodule is still present and prominent  Impression/Plan Upper respiratory infection almost resolved.  Patient has biopsy-proven lung cancer in the left upper lobe. Proceed with left VATS and pulmonary section on Tuesday, February 18 at [facility].  I discussed the benefits and risks of the procedure with the patient.  She agrees  to proceed.  Feb 18 update The patient presents for surgery-left VATS and probable left upper lobectomy Her respiratory status is back to baseline, her  upper respiratory infection has resolved All of her preoperative labs have been reviewed and are satisfactory.  Her potassium has been supplemented and the morning of surgery is up to 4.0  Len Childs, MD [facility] [phone]

## 2018-03-10 ENCOUNTER — Inpatient Hospital Stay (HOSPITAL_COMMUNITY): Payer: Medicare Other

## 2018-03-10 LAB — COMPREHENSIVE METABOLIC PANEL
ALT: 16 U/L (ref 0–44)
AST: 34 U/L (ref 15–41)
Albumin: 3 g/dL — ABNORMAL LOW (ref 3.5–5.0)
Alkaline Phosphatase: 53 U/L (ref 38–126)
Anion gap: 14 (ref 5–15)
BUN: 7 mg/dL — ABNORMAL LOW (ref 8–23)
CHLORIDE: 98 mmol/L (ref 98–111)
CO2: 24 mmol/L (ref 22–32)
Calcium: 8.6 mg/dL — ABNORMAL LOW (ref 8.9–10.3)
Creatinine, Ser: 0.88 mg/dL (ref 0.44–1.00)
GFR calc non Af Amer: >60 mL/min (ref >60)
Glucose, Bld: 118 mg/dL — ABNORMAL HIGH (ref 70–99)
Potassium: 3 mmol/L — ABNORMAL LOW (ref 3.5–5.1)
SODIUM: 136 mmol/L (ref 135–145)
Total Bilirubin: 1.1 mg/dL (ref 0.3–1.2)
Total Protein: 5.9 g/dL — ABNORMAL LOW (ref 6.5–8.1)

## 2018-03-10 LAB — CBC
HCT: 32.7 % — ABNORMAL LOW (ref 36.0–46.0)
Hemoglobin: 10.5 g/dL — ABNORMAL LOW (ref 12.0–15.0)
MCH: 31.6 pg (ref 26.0–34.0)
MCHC: 32.1 g/dL (ref 30.0–36.0)
MCV: 98.5 fL (ref 80.0–100.0)
PLATELETS: 166 10*3/uL (ref 150–400)
RBC: 3.32 MIL/uL — ABNORMAL LOW (ref 3.87–5.11)
RDW: 14.6 % (ref 11.5–15.5)
WBC: 13.7 10*3/uL — ABNORMAL HIGH (ref 4.0–10.5)
nRBC: 0 % (ref 0.0–0.2)

## 2018-03-10 LAB — GLUCOSE, CAPILLARY
Glucose-Capillary: 105 mg/dL — ABNORMAL HIGH (ref 70–99)
Glucose-Capillary: 93 mg/dL (ref 70–99)
Glucose-Capillary: 78 mg/dL (ref 70–99)
Glucose-Capillary: 103 mg/dL — ABNORMAL HIGH (ref 70–99)

## 2018-03-10 MED ORDER — POTASSIUM CHLORIDE 20 MEQ PO PACK
40.0000 meq | PACK | Freq: Two times a day (BID) | ORAL | Status: DC
  Filled 2018-03-10: qty 2

## 2018-03-10 MED ORDER — LISINOPRIL 5 MG PO TABS
5.0000 mg | ORAL_TABLET | Freq: Every day | ORAL | Status: DC
  Administered 2018-03-10 – 2018-03-17 (×8): 5 mg via ORAL
  Filled 2018-03-10 (×8): qty 1

## 2018-03-10 MED ORDER — METOPROLOL TARTRATE 12.5 MG HALF TABLET
12.5000 mg | ORAL_TABLET | Freq: Two times a day (BID) | ORAL | Status: DC
  Administered 2018-03-10 – 2018-03-19 (×16): 12.5 mg via ORAL
  Filled 2018-03-10 (×18): qty 1

## 2018-03-10 MED ORDER — LEVALBUTEROL HCL 0.63 MG/3ML IN NEBU
0.6300 mg | INHALATION_SOLUTION | Freq: Two times a day (BID) | RESPIRATORY_TRACT | Status: DC
  Administered 2018-03-10 – 2018-03-14 (×8): 0.63 mg via RESPIRATORY_TRACT
  Filled 2018-03-10 (×9): qty 3

## 2018-03-10 MED ORDER — POTASSIUM CHLORIDE CRYS ER 20 MEQ PO TBCR
40.0000 meq | EXTENDED_RELEASE_TABLET | Freq: Two times a day (BID) | ORAL | Status: AC
  Administered 2018-03-10 (×2): 40 meq via ORAL
  Filled 2018-03-10 (×2): qty 2

## 2018-03-10 NOTE — Progress Notes (Addendum)
[address]       [place] [address]             [phone]      2 Days Post-Op Procedure(s) (LRB): VIDEO ASSISTED THORACOSCOPY (VATS)/ LOBECTOMY (Left) Subjective: Feeling stronger, denies pain  Objective: Vital signs in last 24 hours: Temp:  [97.9 F (36.6 C)-98.8 F (37.1 C)] 98.1 F (36.7 C) (02/20 0759) Pulse Rate:  [45-101] 75 (02/20 0759) Cardiac Rhythm: Normal sinus rhythm (02/20 0700) Resp:  [9-22] 17 (02/20 0759) BP: (107-173)/(51-121) 155/75 (02/20 0759) SpO2:  [90 %-100 %] 100 % (02/20 0759)  Hemodynamic parameters for last 24 hours:    Intake/Output from previous day: 02/19 0701 - 02/20 0700 In: 924.3 [P.O.:740; I.V.:171.9; IV Piggyback:12.5] Out: 4128 [Urine:1000; Chest Tube:185] Intake/Output this shift: Total I/O In: 10 [I.V.:10] Out: -   General appearance: alert, cooperative and no distress Heart: regular rate and rhythm Lungs: mildly dim in bases Abdomen: benign Extremities: no edema or calf tenderness Wound: dressings CDI  Lab Results: Recent Labs    03/09/18 0550 03/10/18 0141  WBC 16.8* 13.7*  HGB 11.2* 10.5*  HCT 34.7* 32.7*  PLT 221 166   BMET:  Recent Labs    03/09/18 0550 03/10/18 0141  NA 137 136  K 3.1* 3.0*  CL 96* 98  CO2 27 24  GLUCOSE 107* 118*  BUN 7* 7*  CREATININE 0.84 0.88  CALCIUM 9.0 8.6*    PT/INR: No results for input(s): LABPROT, INR in the last 72 hours. ABG    Component Value Date/Time   PHART 7.474 (H) 03/09/2018 0446   HCO3 29.1 (H) 03/09/2018 0446   TCO2 30 03/09/2018 0446   ACIDBASEDEF 1.0 06/21/2015 1259   O2SAT 97.0 03/09/2018 0446   CBG (last 3)  Recent Labs    03/09/18 1555 03/09/18 2226 03/10/18 0758  GLUCAP 104* 131* 105*    Meds Scheduled Meds: . acetaminophen  1,000 mg Oral Q6H   Or  . acetaminophen (TYLENOL) oral liquid 160 mg/5 mL  1,000 mg Oral Q6H  . aspirin  325 mg Oral Daily  . atorvastatin  20 mg Oral QHS  . bisacodyl  10 mg Oral Daily    . Chlorhexidine Gluconate Cloth  6 each Topical Daily  . enoxaparin (LOVENOX) injection  40 mg Subcutaneous Daily  . insulin aspart  0-15 Units Subcutaneous TID WC  . insulin aspart  0-5 Units Subcutaneous QHS  . levalbuterol  0.63 mg Nebulization TID  . mouth rinse  15 mL Mouth Rinse BID  . senna-docusate  1 tablet Oral QHS  . sodium chloride flush  10-40 mL Intracatheter Q12H  . venlafaxine XR  37.5 mg Oral QHS   Continuous Infusions: . bupivacaine ON-Q pain pump    . dextrose 5 % and 0.45% NaCl 10 mL/hr at 03/09/18 1700  . potassium chloride     PRN Meds:.ALPRAZolam, labetalol, oxyCODONE, potassium chloride, sodium chloride flush, traMADol  Xrays Dg Chest Port 1 View  Result Date: 03/10/2018 CLINICAL DATA:  Status post left-sided thoracotomy with chest tubes present EXAM: PORTABLE CHEST 1 VIEW COMPARISON:  March 09, 2018 FINDINGS: Chest tubes are present on the left, unchanged in position. Port-A-Cath tip is in the superior vena cava. The pneumothorax on the left appear somewhat smaller. No tension component. There is subcutaneous air on the left. There is postoperative change on the left with volume loss, stable. No edema or consolidation. Heart size is normal. No adenopathy is  appreciable. There is aortic atherosclerosis. Patient is status post coronary artery bypass grafting. No bone lesions. IMPRESSION: Tube and catheter positions as described. The left sided pneumothorax appears somewhat smaller and less well delineated. No tension component. There is persistent subcutaneous air on the left. Postoperative change with loss noted on the left. No edema or consolidation. Stable cardiac silhouette. Aortic Atherosclerosis (ICD10-I70.0). Electronically Signed   By: Lowella Grip III M.D.   On: 03/10/2018 08:56   Dg Chest Port 1 View  Result Date: 03/09/2018 CLINICAL DATA:  Lung nodule EXAM: PORTABLE CHEST 1 VIEW COMPARISON:  Yesterday FINDINGS: Increased left apical pneumothorax, ~  20%. New chest wall emphysema. One of the chest tubes has rotated more medially. Left subclavian line with tip at the SVC. The right lung is clear. Normal heart size. CABG and coronary stenting. IMPRESSION: 1. Increased left pneumothorax, approximately 20%. 2. One of the left chest tubes has rotated. Electronically Signed   By: Monte Fantasia M.D.   On: 03/09/2018 09:13   Dg Chest Port 1 View  Result Date: 03/08/2018 CLINICAL DATA:  Status post lung surgery EXAM: PORTABLE CHEST 1 VIEW COMPARISON:  03/08/2018, 02/23/2018, 02/07/2018 FINDINGS: Post sternotomy changes. Interim placement of left-sided central venous catheter with tip overlying the SVC. Placement of left-sided chest drainage catheters with tips overlying the left apex. New surgical sutures in the left upper lung and left perihilar region with multiple surgical clips. Probable stent material over the left cardiac silhouette. No discrete pneumothorax. Probable left displaced fourth rib fracture. Aortic atherosclerosis. IMPRESSION: 1. Interim postsurgical changes of the left lung with volume loss and mediastinal shift to the left. Placement of left-sided chest tubes and left-sided central venous catheter as above. 2. Suspected acute left displaced fourth rib fracture. Electronically Signed   By: Donavan Foil M.D.   On: 03/08/2018 15:05    Assessment/Plan: S/P Procedure(s) (LRB): VIDEO ASSISTED THORACOSCOPY (VATS)/ LOBECTOMY (Left)   1 overall doing well 2 hemodyn stable in sinus rhythm, she is hypertensive. With normal renal fxn will resume ACE-I and also beta blocker 3 CXR reviewed, with large air leak will cont as currently 4 hypokalemia- replace 5 H/H a little lower, cont to monitor  6 leukocytosis with improving trend 7 sats good on 3 liters- cont to wean, IS/Pulm toilet 8 BS control is adequate 9 final path pending 10 rehab as able 11 lovenox for DVT prophylaxis  LOS: 2 days    [name] PA-C 03/10/2018 Pager [phone]  [phone] Reduce suction to 10 cm water Air leak is from anterior tube CXR in am

## 2018-03-10 NOTE — Progress Notes (Signed)
[address]       [place] [address]             [phone]      2 Days Post-Op Procedure(s) (LRB): VIDEO ASSISTED THORACOSCOPY (VATS)/ LOBECTOMY (Left) Subjective: Nursing reports a short episode of non responsiveness(approx 10 seconds) Patient said she felt :"hot" at the time but currently feels the same as previously  Objective: Vital signs in last 24 hours: Temp:  [97.9 F (36.6 C)-98.6 F (37 C)] 98.1 F (36.7 C) (02/20 0759) Pulse Rate:  [45-101] 91 (02/20 1124) Cardiac Rhythm: Normal sinus rhythm (02/20 0700) Resp:  [9-22] 20 (02/20 1124) BP: (136-172)/(51-82) 163/79 (02/20 1124) SpO2:  [90 %-100 %] 98 % (02/20 1124)  Hemodynamic parameters for last 24 hours:    Intake/Output from previous day: 02/19 0701 - 02/20 0700 In: 924.3 [P.O.:740; I.V.:171.9; IV Piggyback:12.5] Out: 3832 [Urine:1000; Chest Tube:185] Intake/Output this shift: Total I/O In: 10 [I.V.:10] Out: -   General appearance: alert, cooperative and no distress Neurologic: intact Heart: regular rate and rhythm Lungs: coarse Abdomen: soft, non-tender  Lab Results: Recent Labs    03/09/18 0550 03/10/18 0141  WBC 16.8* 13.7*  HGB 11.2* 10.5*  HCT 34.7* 32.7*  PLT 221 166   BMET:  Recent Labs    03/09/18 0550 03/10/18 0141  NA 137 136  K 3.1* 3.0*  CL 96* 98  CO2 27 24  GLUCOSE 107* 118*  BUN 7* 7*  CREATININE 0.84 0.88  CALCIUM 9.0 8.6*    PT/INR: No results for input(s): LABPROT, INR in the last 72 hours. ABG    Component Value Date/Time   PHART 7.474 (H) 03/09/2018 0446   HCO3 29.1 (H) 03/09/2018 0446   TCO2 30 03/09/2018 0446   ACIDBASEDEF 1.0 06/21/2015 1259   O2SAT 97.0 03/09/2018 0446   CBG (last 3)  Recent Labs    03/09/18 2226 03/10/18 0758 03/10/18 1125  GLUCAP 131* 105* 93    Meds Scheduled Meds: . acetaminophen  1,000 mg Oral Q6H   Or  . acetaminophen (TYLENOL) oral liquid 160 mg/5 mL  1,000 mg Oral Q6H  . aspirin  325 mg  Oral Daily  . atorvastatin  20 mg Oral QHS  . bisacodyl  10 mg Oral Daily  . Chlorhexidine Gluconate Cloth  6 each Topical Daily  . enoxaparin (LOVENOX) injection  40 mg Subcutaneous Daily  . insulin aspart  0-15 Units Subcutaneous TID WC  . insulin aspart  0-5 Units Subcutaneous QHS  . levalbuterol  0.63 mg Nebulization TID  . lisinopril  5 mg Oral Daily  . mouth rinse  15 mL Mouth Rinse BID  . metoprolol tartrate  12.5 mg Oral BID  . potassium chloride  40 mEq Oral BID  . senna-docusate  1 tablet Oral QHS  . sodium chloride flush  10-40 mL Intracatheter Q12H  . venlafaxine XR  37.5 mg Oral QHS   Continuous Infusions: . bupivacaine ON-Q pain pump    . dextrose 5 % and 0.45% NaCl 10 mL/hr at 03/09/18 1700  . potassium chloride     PRN Meds:.ALPRAZolam, labetalol, oxyCODONE, potassium chloride, sodium chloride flush, traMADol  Xrays Dg Chest Port 1 View  Result Date: 03/10/2018 CLINICAL DATA:  Status post left-sided thoracotomy with chest tubes present EXAM: PORTABLE CHEST 1 VIEW COMPARISON:  March 09, 2018 FINDINGS: Chest tubes are present on the left, unchanged in position. Port-A-Cath tip is in the superior vena cava. The pneumothorax on  the left appear somewhat smaller. No tension component. There is subcutaneous air on the left. There is postoperative change on the left with volume loss, stable. No edema or consolidation. Heart size is normal. No adenopathy is appreciable. There is aortic atherosclerosis. Patient is status post coronary artery bypass grafting. No bone lesions. IMPRESSION: Tube and catheter positions as described. The left sided pneumothorax appears somewhat smaller and less well delineated. No tension component. There is persistent subcutaneous air on the left. Postoperative change with loss noted on the left. No edema or consolidation. Stable cardiac silhouette. Aortic Atherosclerosis (ICD10-I70.0). Electronically Signed   By: Lowella Grip III M.D.   On:  03/10/2018 08:56   Dg Chest Port 1 View  Result Date: 03/09/2018 CLINICAL DATA:  Lung nodule EXAM: PORTABLE CHEST 1 VIEW COMPARISON:  Yesterday FINDINGS: Increased left apical pneumothorax, ~ 20%. New chest wall emphysema. One of the chest tubes has rotated more medially. Left subclavian line with tip at the SVC. The right lung is clear. Normal heart size. CABG and coronary stenting. IMPRESSION: 1. Increased left pneumothorax, approximately 20%. 2. One of the left chest tubes has rotated. Electronically Signed   By: Monte Fantasia M.D.   On: 03/09/2018 09:13   Dg Chest Port 1 View  Result Date: 03/08/2018 CLINICAL DATA:  Status post lung surgery EXAM: PORTABLE CHEST 1 VIEW COMPARISON:  03/08/2018, 02/23/2018, 02/07/2018 FINDINGS: Post sternotomy changes. Interim placement of left-sided central venous catheter with tip overlying the SVC. Placement of left-sided chest drainage catheters with tips overlying the left apex. New surgical sutures in the left upper lung and left perihilar region with multiple surgical clips. Probable stent material over the left cardiac silhouette. No discrete pneumothorax. Probable left displaced fourth rib fracture. Aortic atherosclerosis. IMPRESSION: 1. Interim postsurgical changes of the left lung with volume loss and mediastinal shift to the left. Placement of left-sided chest tubes and left-sided central venous catheter as above. 2. Suspected acute left displaced fourth rib fracture. Electronically Signed   By: Donavan Foil M.D.   On: 03/08/2018 15:05    Assessment/Plan: S/P Procedure(s) (LRB): VIDEO ASSISTED THORACOSCOPY (VATS)/ LOBECTOMY (Left)  1 Brief episode of non responsiveness, unclear etiology. No other neuro- changes, symptoms resolved quickly. May have been somewhat vagal in nature.Will obtain orthostatics. Cont to  Observe closely. There was no associated injury.    LOS: 2 days    [name] [facility] 03/10/2018 Pager [phone]

## 2018-03-10 NOTE — Progress Notes (Signed)
Pt was unreasoned for less than 1 minute when she was going to bathroom. Pt fixed at the moment, didn't response then she fell toward bed with her body was stiff, then she was response to voice, and normal her self. Vital sign was fine, her CBG was fine. Recommended pt to stay in the bed with bedpan this time. She couldn't void with bedpan, asked to go to bathroom. Used bedside commode. When she went to the bed to sit, she was same manner to sitting on the bed. She standing lean toward Rt. Side. She was briefly didn't response to void command. Third time she used bedside commode, her movement was better than before and response with verbal command but still very stiff way to sit on the bed. Continue to monitor pt's condition. HS [employer]

## 2018-03-10 NOTE — Progress Notes (Signed)
Fabienne Bruns RN at bedside - called for rapid assist. Pt was starting to ambulate to restroom with RN when she began staring and becoming non responsive. Lasted approximately 10 seconds. Pt was rapidly assisted back to bed. Pt states she "just got hot".   Chest tube sites stable, air leak remains.   Vitals: BP 163/70. HR 91. O2 98% on 3L nasal cannula.   CBG 93.  [place] paged at [phone].  Fritz Pickerel, RN

## 2018-03-10 NOTE — Care Management Important Message (Signed)
Important Message  Patient Details  Name: Rhonda Hughes MRN: [id number] Date of Birth: [birth year]   Medicare Important Message Given:  Yes    [name] P Crouch 03/10/2018, 11:08 AM

## 2018-03-11 ENCOUNTER — Inpatient Hospital Stay (HOSPITAL_COMMUNITY): Payer: Medicare Other

## 2018-03-11 ENCOUNTER — Other Ambulatory Visit: Payer: Self-pay

## 2018-03-11 LAB — BASIC METABOLIC PANEL
Anion gap: 6 (ref 5–15)
BUN: 10 mg/dL (ref 8–23)
CO2: 29 mmol/L (ref 22–32)
Calcium: 8.6 mg/dL — ABNORMAL LOW (ref 8.9–10.3)
Chloride: 101 mmol/L (ref 98–111)
Creatinine, Ser: 0.74 mg/dL (ref 0.44–1.00)
GFR calc Af Amer: >60 mL/min (ref >60)
GFR calc non Af Amer: >60 mL/min (ref >60)
Glucose, Bld: 100 mg/dL — ABNORMAL HIGH (ref 70–99)
Potassium: 4.2 mmol/L (ref 3.5–5.1)
Sodium: 136 mmol/L (ref 135–145)

## 2018-03-11 LAB — GLUCOSE, CAPILLARY
Glucose-Capillary: 83 mg/dL (ref 70–99)
Glucose-Capillary: 90 mg/dL (ref 70–99)
Glucose-Capillary: 94 mg/dL (ref 70–99)
Glucose-Capillary: 92 mg/dL (ref 70–99)

## 2018-03-11 NOTE — Plan of Care (Signed)
  Problem: Education: Goal: Knowledge of disease or condition will improve Outcome: Progressing Goal: Knowledge of the prescribed therapeutic regimen will improve Outcome: Progressing   Problem: Activity: Goal: Risk for activity intolerance will decrease Outcome: Progressing   Problem: Cardiac: Goal: Will achieve and/or maintain hemodynamic stability Outcome: Progressing   Problem: Clinical Measurements: Goal: Postoperative complications will be avoided or minimized Outcome: Progressing   Problem: Respiratory: Goal: Respiratory status will improve Outcome: Progressing   Problem: Pain Management: Goal: Pain level will decrease Outcome: Progressing   Problem: Skin Integrity: Goal: Wound healing without signs and symptoms infection will improve Outcome: Progressing

## 2018-03-11 NOTE — Progress Notes (Addendum)
[address]       [place] [address]             [phone]      3 Days Post-Op Procedure(s) (LRB): VIDEO ASSISTED THORACOSCOPY (VATS)/ LOBECTOMY (Left) Subjective: Feels a little better today, apparently had a second episode yesterday with short term unresponsiveness. She attributed to getting out of bed while having coughing episode. She does desat a t times. No apparent sequelae  Objective: Vital signs in last 24 hours: Temp:  [98.2 F (36.8 C)-98.9 F (37.2 C)] 98.9 F (37.2 C) (02/21 0355) Pulse Rate:  [65-91] 65 (02/21 0355) Cardiac Rhythm: Normal sinus rhythm (02/21 0700) Resp:  [15-24] 22 (02/21 0355) BP: (98-163)/(49-80) 133/67 (02/21 0355) SpO2:  [97 %-100 %] 100 % (02/21 0355)  Hemodynamic parameters for last 24 hours:    Intake/Output from previous day: 02/20 0701 - 02/21 0700 In: 758.4 [P.O.:480; I.V.:278.4] Out: 300 [Chest Tube:300] Intake/Output this shift: No intake/output data recorded.  General appearance: alert, cooperative and no distress Heart: regular rate and rhythm Lungs: dim on left Abdomen: benign Extremities: no edema or calf tenderness Wound: incis healing well Neuro: remains grossly non focal Lab Results: Recent Labs    03/09/18 0550 03/10/18 0141  WBC 16.8* 13.7*  HGB 11.2* 10.5*  HCT 34.7* 32.7*  PLT 221 166   BMET:  Recent Labs    03/10/18 0141 03/11/18 0402  NA 136 136  K 3.0* 4.2  CL 98 101  CO2 24 29  GLUCOSE 118* 100*  BUN 7* 10  CREATININE 0.88 0.74  CALCIUM 8.6* 8.6*    PT/INR: No results for input(s): LABPROT, INR in the last 72 hours. ABG    Component Value Date/Time   PHART 7.474 (H) 03/09/2018 0446   HCO3 29.1 (H) 03/09/2018 0446   TCO2 30 03/09/2018 0446   ACIDBASEDEF 1.0 06/21/2015 1259   O2SAT 97.0 03/09/2018 0446   CBG (last 3)  Recent Labs    03/10/18 1648 03/10/18 2138 03/11/18 0742  GLUCAP 78 103* 83    Meds Scheduled Meds: . acetaminophen  1,000 mg Oral Q6H    Or  . acetaminophen (TYLENOL) oral liquid 160 mg/5 mL  1,000 mg Oral Q6H  . aspirin  325 mg Oral Daily  . atorvastatin  20 mg Oral QHS  . bisacodyl  10 mg Oral Daily  . Chlorhexidine Gluconate Cloth  6 each Topical Daily  . enoxaparin (LOVENOX) injection  40 mg Subcutaneous Daily  . insulin aspart  0-15 Units Subcutaneous TID WC  . insulin aspart  0-5 Units Subcutaneous QHS  . levalbuterol  0.63 mg Nebulization BID  . lisinopril  5 mg Oral Daily  . mouth rinse  15 mL Mouth Rinse BID  . metoprolol tartrate  12.5 mg Oral BID  . senna-docusate  1 tablet Oral QHS  . sodium chloride flush  10-40 mL Intracatheter Q12H  . venlafaxine XR  37.5 mg Oral QHS   Continuous Infusions: . bupivacaine ON-Q pain pump    . dextrose 5 % and 0.45% NaCl 10 mL/hr at 03/11/18 0300  . potassium chloride     PRN Meds:.ALPRAZolam, labetalol, oxyCODONE, potassium chloride, sodium chloride flush, traMADol  Xrays Dg Chest Port 1 View  Result Date: 03/11/2018 CLINICAL DATA:  Chest tubes present. Shortness of breath. Sore chest. EXAM: PORTABLE CHEST 1 VIEW COMPARISON:  One-view chest x-ray 03/10/2018 FINDINGS: Heart is enlarged. Coronary stents are noted. Left subclavian line is in place.  Two left-sided chest tubes are in place. The left apical pneumothorax is decreased in size slightly. The right lung remains clear. IMPRESSION: 1. Slight decrease and left apical pneumothorax with 2 left-sided chest tubes in place. 2. Stable left subclavian line. 3. Cardiomegaly without failure. Electronically Signed   By: San Morelle M.D.   On: 03/11/2018 07:19   Dg Chest Port 1 View  Result Date: 03/10/2018 CLINICAL DATA:  Status post left-sided thoracotomy with chest tubes present EXAM: PORTABLE CHEST 1 VIEW COMPARISON:  March 09, 2018 FINDINGS: Chest tubes are present on the left, unchanged in position. Port-A-Cath tip is in the superior vena cava. The pneumothorax on the left appear somewhat smaller. No tension  component. There is subcutaneous air on the left. There is postoperative change on the left with volume loss, stable. No edema or consolidation. Heart size is normal. No adenopathy is appreciable. There is aortic atherosclerosis. Patient is status post coronary artery bypass grafting. No bone lesions. IMPRESSION: Tube and catheter positions as described. The left sided pneumothorax appears somewhat smaller and less well delineated. No tension component. There is persistent subcutaneous air on the left. Postoperative change with loss noted on the left. No edema or consolidation. Stable cardiac silhouette. Aortic Atherosclerosis (ICD10-I70.0). Electronically Signed   By: Lowella Grip III M.D.   On: 03/10/2018 08:56    Assessment/Plan: S/P Procedure(s) (LRB): VIDEO ASSISTED THORACOSCOPY (VATS)/ LOBECTOMY (Left)  1 having some vagal episodes, knows not to get out of bed without help. May need further eval if re-occurs 2 conts with large air leak, CXR shows some improvement in size of pneumothorax. Surgeon has ordered removal of post tube and tube to H2O seal 3 sats ok on 2 liters- push pulm toilet and wean as able 4 hemodyn stable in sinus rhythm CBG's well controlled 5 renal fxn in normal range 6 routine rehab   LOS: 3 days    [name] PA-C 03/11/2018 Pager [phone]  Path- stage I small cell ca Posterior tube and On-Q out Reduce narcotics for AMS Lung very thin and fragile with  Sig air leak- leave on water seal and check daily cxr patient examined and medical record reviewed,agree with above note. [name] 03/11/2018

## 2018-03-12 ENCOUNTER — Inpatient Hospital Stay (HOSPITAL_COMMUNITY): Payer: Medicare Other

## 2018-03-12 LAB — GLUCOSE, CAPILLARY
Glucose-Capillary: 104 mg/dL — ABNORMAL HIGH (ref 70–99)
Glucose-Capillary: 89 mg/dL (ref 70–99)
Glucose-Capillary: 102 mg/dL — ABNORMAL HIGH (ref 70–99)
Glucose-Capillary: 89 mg/dL (ref 70–99)

## 2018-03-12 MED ORDER — ENSURE ENLIVE PO LIQD
237.0000 mL | Freq: Three times a day (TID) | ORAL | Status: DC
  Administered 2018-03-12: 237 mL via ORAL

## 2018-03-12 NOTE — Progress Notes (Signed)
4 Days Post-Op Procedure(s) (LRB): VIDEO ASSISTED THORACOSCOPY (VATS)/ LOBECTOMY (Left) Subjective: Increase in left postoperative pneumothorax after removal of posterior tube which had minimal air leak. Suction increased 20 cmH2O Patient ambulating in hallway, maintaining O2 sat Mental status improved after reducing narcotic dose Objective: Vital signs in last 24 hours: Temp:  [98 F (36.7 C)-98.8 F (37.1 C)] 98 F (36.7 C) (02/22 1200) Pulse Rate:  [60-75] 70 (02/22 1200) Cardiac Rhythm: Normal sinus rhythm (02/22 1200) Resp:  [10-27] 10 (02/22 1200) BP: (111-137)/(49-66) 115/64 (02/22 1200) SpO2:  [90 %-100 %] 94 % (02/22 1200)  Hemodynamic parameters for last 24 hours:  Sinus rhythm  Intake/Output from previous day: 02/21 0701 - 02/22 0700 In: 229.9 [I.V.:229.9] Out: 0  Intake/Output this shift: No intake/output data recorded.  Persistent large air leak from anterior pleural tube Breath sounds diminished but clear Neuro intact  Lab Results: Recent Labs    03/10/18 0141  WBC 13.7*  HGB 10.5*  HCT 32.7*  PLT 166   BMET:  Recent Labs    03/10/18 0141 03/11/18 0402  NA 136 136  K 3.0* 4.2  CL 98 101  CO2 24 29  GLUCOSE 118* 100*  BUN 7* 10  CREATININE 0.88 0.74  CALCIUM 8.6* 8.6*    PT/INR: No results for input(s): LABPROT, INR in the last 72 hours. ABG    Component Value Date/Time   PHART 7.474 (H) 03/09/2018 0446   HCO3 29.1 (H) 03/09/2018 0446   TCO2 30 03/09/2018 0446   ACIDBASEDEF 1.0 06/21/2015 1259   O2SAT 97.0 03/09/2018 0446   CBG (last 3)  Recent Labs    03/11/18 2145 03/12/18 0740 03/12/18 1135  GLUCAP 92 104* 89    Assessment/Plan: S/P Procedure(s) (LRB): VIDEO ASSISTED THORACOSCOPY (VATS)/ LOBECTOMY (Left) Continue 20 cm suction to left anterior chest tube   LOS: 4 days    [name] 03/12/2018

## 2018-03-12 NOTE — Progress Notes (Signed)
Pt's chest tube suction is placed in -20cm per verbal order of Dr. Prescott Gum.

## 2018-03-12 NOTE — Plan of Care (Signed)
  Problem: Education: Goal: Knowledge of disease or condition will improve Outcome: Progressing Goal: Knowledge of the prescribed therapeutic regimen will improve Outcome: Progressing   Problem: Activity: Goal: Risk for activity intolerance will decrease Outcome: Progressing   Problem: Cardiac: Goal: Will achieve and/or maintain hemodynamic stability Outcome: Progressing   Problem: Clinical Measurements: Goal: Postoperative complications will be avoided or minimized Outcome: Progressing   Problem: Respiratory: Goal: Respiratory status will improve Outcome: Progressing   Problem: Pain Management: Goal: Pain level will decrease Outcome: Progressing   Problem: Skin Integrity: Goal: Wound healing without signs and symptoms infection will improve Outcome: Progressing

## 2018-03-12 NOTE — Progress Notes (Signed)
After pt had a second episode of loose stool at 1338, pt had some dizziness spells coming out of the bathroom, vitals taken n stable, pt is resting in a bed now, plan to ambulate her for now is cancelled, family member is in bed side and is updating,  will continue to monitor the patient  Palma Holter, RN

## 2018-03-13 ENCOUNTER — Inpatient Hospital Stay (HOSPITAL_COMMUNITY): Payer: Medicare Other

## 2018-03-13 LAB — GLUCOSE, CAPILLARY
Glucose-Capillary: 102 mg/dL — ABNORMAL HIGH (ref 70–99)
Glucose-Capillary: 164 mg/dL — ABNORMAL HIGH (ref 70–99)
Glucose-Capillary: 98 mg/dL (ref 70–99)
Glucose-Capillary: 99 mg/dL (ref 70–99)

## 2018-03-13 LAB — CBC
HCT: 28.9 % — ABNORMAL LOW (ref 36.0–46.0)
Hemoglobin: 9.4 g/dL — ABNORMAL LOW (ref 12.0–15.0)
MCH: 32.5 pg (ref 26.0–34.0)
MCHC: 32.5 g/dL (ref 30.0–36.0)
MCV: 100 fL (ref 80.0–100.0)
Platelets: 210 10*3/uL (ref 150–400)
RBC: 2.89 MIL/uL — ABNORMAL LOW (ref 3.87–5.11)
RDW: 13.8 % (ref 11.5–15.5)
WBC: 10 10*3/uL (ref 4.0–10.5)
nRBC: 0 % (ref 0.0–0.2)

## 2018-03-13 NOTE — Progress Notes (Signed)
Pt ambulated in a hallway without having SOB and oxygen saturation maintained at 98% in RA, pt is in RA now, sitting in a recliner, comfortable, will continue to monitor  Palma Holter, RN

## 2018-03-13 NOTE — Progress Notes (Addendum)
      [address]       [place] [address]             [phone]      5 Days Post-Op Procedure(s) (LRB): VIDEO ASSISTED THORACOSCOPY (VATS)/ LOBECTOMY (Left) Subjective: Walking the unit this morning. It was further than she had walked since surgery  Objective: Vital signs in last 24 hours: Temp:  [97.8 F (36.6 C)-99.1 F (37.3 C)] 98.2 F (36.8 C) (02/23 1106) Pulse Rate:  [59-79] 66 (02/23 1138) Cardiac Rhythm: Normal sinus rhythm (02/23 0700) Resp:  [0-25] 25 (02/23 1138) BP: (59-133)/(39-93) 126/93 (02/23 1138) SpO2:  [96 %-100 %] 100 % (02/23 1106)     Intake/Output from previous day: 02/22 0701 - 02/23 0700 In: 880 [P.O.:880] Out: 1161 [Urine:1001; Chest Tube:160] Intake/Output this shift: Total I/O In: 240 [P.O.:240] Out: 300 [Urine:300]  General appearance: alert, cooperative and no distress Heart: regular rate and rhythm, S1, S2 normal, no murmur, click, rub or gallop Lungs: clear to auscultation bilaterally Abdomen: soft, non-tender; bowel sounds normal; no masses,  no organomegaly Extremities: extremities normal, atraumatic, no cyanosis or edema Wound: clean and dry  Lab Results: Recent Labs    03/13/18 0315  WBC 10.0  HGB 9.4*  HCT 28.9*  PLT 210   BMET:  Recent Labs    03/11/18 0402  NA 136  K 4.2  CL 101  CO2 29  GLUCOSE 100*  BUN 10  CREATININE 0.74  CALCIUM 8.6*    PT/INR: No results for input(s): LABPROT, INR in the last 72 hours. ABG    Component Value Date/Time   PHART 7.474 (H) 03/09/2018 0446   HCO3 29.1 (H) 03/09/2018 0446   TCO2 30 03/09/2018 0446   ACIDBASEDEF 1.0 06/21/2015 1259   O2SAT 97.0 03/09/2018 0446   CBG (last 3)  Recent Labs    03/12/18 2118 03/13/18 0747 03/13/18 1109  GLUCAP 89 102* 164*    Assessment/Plan: S/P Procedure(s) (LRB): VIDEO ASSISTED THORACOSCOPY (VATS)/ LOBECTOMY (Left)  1. CV-NSR in the 60s, BP well controlled.  2. Pulm-room air with 94% oxygenation. Chest  xray looks stable, maybe slightly  More severe hemidiaphragm. Air leak remains.  3. Renal-creatinine 0.74, electrolytes okay 4. H and H stable 9.4/28.9 5. Endo-blood glucose well controlled  Plan: current chest tube on 20cm of suction-continue. Felling stronger but still unsteady on her feet. No further vaso-vagal episodes.    LOS: 5 days    Elgie Collard 03/13/2018  Chart reviewed, patient examined, agree with above. CXR looks ok with no ptx. There is a large 4-7 chamber air leak that is continuous. Can hear the leak from her lung with respiration. Continue to 20 cm suction.

## 2018-03-14 ENCOUNTER — Inpatient Hospital Stay (HOSPITAL_COMMUNITY): Payer: Medicare Other

## 2018-03-14 LAB — GLUCOSE, CAPILLARY
Glucose-Capillary: 99 mg/dL (ref 70–99)
Glucose-Capillary: 112 mg/dL — ABNORMAL HIGH (ref 70–99)
Glucose-Capillary: 137 mg/dL — ABNORMAL HIGH (ref 70–99)

## 2018-03-14 NOTE — Progress Notes (Addendum)
6 Days Post-Op Procedure(s) (LRB): VIDEO ASSISTED THORACOSCOPY (VATS)/ LOBECTOMY (Left) Subjective: Up in the bedside chair. Denies pain or shortness of breath. Maintained O2 sat of 98% on RA while walking in the hall yesterday per RN's note.  Tolerating PO's. Having appropriate bowel function.  Objective: Vital signs in last 24 hours: Temp:  [98.2 F (36.8 C)-98.7 F (37.1 C)] 98.7 F (37.1 C) (02/24 0742) Pulse Rate:  [59-81] 76 (02/24 0742) Cardiac Rhythm: Normal sinus rhythm (02/24 0754) Resp:  [11-25] 11 (02/24 0742) BP: (93-126)/(43-93) 111/47 (02/24 0742) SpO2:  [94 %-100 %] 100 % (02/24 0752)     Intake/Output from previous day: 02/23 0701 - 02/24 0700 In: 480 [P.O.:480] Out: 1050 [Urine:800; Chest Tube:250] Intake/Output this shift: No intake/output data recorded.  General appearance: alert, cooperative and no distress Heart: regular rate and rhythm Lungs: clear to auscultation bilaterally. CXR reviewed: no PTX, no increase minor subQ air. Lung fields o/w clear. Abdomen: soft, non-tender; bowel sounds normal; no masses,  no organomegaly Extremities: extremities normal, atraumatic, no cyanosis or edema Wound: clean and dry  Lab Results: Recent Labs    03/13/18 0315  WBC 10.0  HGB 9.4*  HCT 28.9*  PLT 210   BMET: No results for input(s): NA, K, CL, CO2, GLUCOSE, BUN, CREATININE, CALCIUM in the last 72 hours.  PT/INR: No results for input(s): LABPROT, INR in the last 72 hours. ABG    Component Value Date/Time   PHART 7.474 (H) 03/09/2018 0446   HCO3 29.1 (H) 03/09/2018 0446   TCO2 30 03/09/2018 0446   ACIDBASEDEF 1.0 06/21/2015 1259   O2SAT 97.0 03/09/2018 0446   CBG (last 3)  Recent Labs    03/13/18 1643 03/13/18 2141 03/14/18 0722  GLUCAP 98 99 99    Assessment/Plan: S/P Procedure(s) (LRB): VIDEO ASSISTED THORACOSCOPY (VATS)/ LOBECTOMY (Left)  -POD6 left VATS and left upper lobectomy for biopsy-proven small cell carcinoma. Posterior CT removed  last week. Continuous air leak per remaining CT but has good expansion of the left lung. Suction reduced to -10cmH2O this AM.  Repeat CXR in AM.  Continue HHN, pulmonary hygiene. -Expected acute blood loss anemia. Hct reasonably stable. Repeat in AM. -CAD. S/P CABG 09/2017.  Continue ASA, statin -Hypokalemia- resolved. -H/O of HTN- BP well controlled on metoprolol and lisinopril -DVT prophylaxis- continue SQ enoxaparin   LOS: 6 days    Antony Odea, PA-C [phone] 03/14/2018   patient examined and medical record reviewed,agree with above note. [name] 03/14/2018

## 2018-03-15 ENCOUNTER — Inpatient Hospital Stay (HOSPITAL_COMMUNITY): Payer: Medicare Other

## 2018-03-15 LAB — GLUCOSE, CAPILLARY
Glucose-Capillary: 89 mg/dL (ref 70–99)
Glucose-Capillary: 94 mg/dL (ref 70–99)
Glucose-Capillary: 111 mg/dL — ABNORMAL HIGH (ref 70–99)
Glucose-Capillary: 101 mg/dL — ABNORMAL HIGH (ref 70–99)
Glucose-Capillary: 99 mg/dL (ref 70–99)

## 2018-03-15 LAB — BASIC METABOLIC PANEL
Anion gap: 8 (ref 5–15)
BUN: 5 mg/dL — ABNORMAL LOW (ref 8–23)
CO2: 28 mmol/L (ref 22–32)
Calcium: 8.8 mg/dL — ABNORMAL LOW (ref 8.9–10.3)
Chloride: 96 mmol/L — ABNORMAL LOW (ref 98–111)
Creatinine, Ser: 0.73 mg/dL (ref 0.44–1.00)
GFR calc Af Amer: >60 mL/min (ref >60)
GFR calc non Af Amer: >60 mL/min (ref >60)
Glucose, Bld: 105 mg/dL — ABNORMAL HIGH (ref 70–99)
Potassium: 3.5 mmol/L (ref 3.5–5.1)
SODIUM: 132 mmol/L — AB (ref 135–145)

## 2018-03-15 LAB — CBC
HCT: 29.1 % — ABNORMAL LOW (ref 36.0–46.0)
Hemoglobin: 9.7 g/dL — ABNORMAL LOW (ref 12.0–15.0)
MCH: 32.6 pg (ref 26.0–34.0)
MCHC: 33.3 g/dL (ref 30.0–36.0)
MCV: 97.7 fL (ref 80.0–100.0)
Platelets: 261 10*3/uL (ref 150–400)
RBC: 2.98 MIL/uL — ABNORMAL LOW (ref 3.87–5.11)
RDW: 13.5 % (ref 11.5–15.5)
WBC: 9.4 10*3/uL (ref 4.0–10.5)
nRBC: 0 % (ref 0.0–0.2)

## 2018-03-15 MED ORDER — LEVALBUTEROL HCL 0.63 MG/3ML IN NEBU: 0.6300 mg | INHALATION_SOLUTION | Freq: Four times a day (QID) | RESPIRATORY_TRACT | Status: DC | PRN

## 2018-03-15 NOTE — Progress Notes (Signed)
Per Angela Nevin RN, central line was d/c'd by her last Monday. She said to this Probation officer that she will remove the documentation from the flowsheet.  Sandie Ano RN IV [facility] team

## 2018-03-15 NOTE — Progress Notes (Addendum)
      [address]       [place] [address]             [phone]      7 Days Post-Op Procedure(s) (LRB): VIDEO ASSISTED THORACOSCOPY (VATS)/ LOBECTOMY (Left) Subjective: No issues overnight. She is doing so-so with her diet.   Objective: Vital signs in last 24 hours: Temp:  [98.1 F (36.7 C)-99.2 F (37.3 C)] 99.1 F (37.3 C) (02/25 0423) Pulse Rate:  [73-99] 99 (02/24 1900) Cardiac Rhythm: Normal sinus rhythm (02/25 0700) Resp:  [16-23] 19 (02/25 0423) BP: (87-121)/(44-54) 120/54 (02/25 0423) SpO2:  [97 %-99 %] 99 % (02/25 0423)     Intake/Output from previous day: 02/24 0701 - 02/25 0700 In: -  Out: 80 [Chest Tube:80] Intake/Output this shift: No intake/output data recorded.  General appearance: alert, cooperative and no distress Heart: regular rate and rhythm, S1, S2 normal, no murmur, click, rub or gallop Lungs: clear to auscultation bilaterally Abdomen: soft, non-tender; bowel sounds normal; no masses,  no organomegaly Extremities: extremities normal, atraumatic, no cyanosis or edema Wound: clean and dry  Lab Results: Recent Labs    03/13/18 0315 03/15/18 0313  WBC 10.0 9.4  HGB 9.4* 9.7*  HCT 28.9* 29.1*  PLT 210 261   BMET:  Recent Labs    03/15/18 0313  NA 132*  K 3.5  CL 96*  CO2 28  GLUCOSE 105*  BUN 5*  CREATININE 0.73  CALCIUM 8.8*    PT/INR: No results for input(s): LABPROT, INR in the last 72 hours. ABG    Component Value Date/Time   PHART 7.474 (H) 03/09/2018 0446   HCO3 29.1 (H) 03/09/2018 0446   TCO2 30 03/09/2018 0446   ACIDBASEDEF 1.0 06/21/2015 1259   O2SAT 97.0 03/09/2018 0446   CBG (last 3)  Recent Labs    03/14/18 1142 03/14/18 2144 03/15/18 0646  GLUCAP 112* 137* 94    Assessment/Plan: S/P Procedure(s) (LRB): VIDEO ASSISTED THORACOSCOPY (VATS)/ LOBECTOMY (Left)  1. CV-NSR in the 80s, BP well controlled.  2. Pulm-tolerating room air with good oxygen saturation. 80cc/24 hours out of chest  tube. Down to 10cm of suction. CXR stable. 3. Renal-creatinine 0.73, potassium 3.5 4. H and H 9.7/29.1, expected acute blood loss anemia 5. Endo- CBGs 112, 137, and 94-well controlled 6. DVT prophylaxis-continue SQ  Plan: Possibly switch to a mini express. Air leak remains but CXR the same on 10cm of suction. Patient is on room air with good saturation. Not much drainage at this point from the chest tube.      LOS: 7 days    Rhonda Hughes 03/15/2018  Chest x-ray image personally reviewed and patient examined I changed the chest tube dressing personally-clean and intact with suture keeping tube immobilized.  Air leak appears to be somewhat improved Chest tube now to waterseal Check x-ray in a.m. if no change on waterseal patient may be discharged in the next 48 hours with mini express and home health nurse to supervise chest tube care.  patient examined and medical record reviewed,agree with above note. Rhonda Hughes 03/15/2018

## 2018-03-15 NOTE — Care Management Note (Signed)
Case Management Note  Patient Details  Name: Rhonda Hughes MRN: [id number] Date of Birth: [birth year]  Subjective/Objective:     Pt is s/p VATS               Action/Plan:   PTA independent from home with husband. Pt has PCP and denied barriers with paying for prescription medications.  Pt is interested in [facility] provided medicare.gov HH list.     Expected Discharge Date:                  Expected Discharge Plan:  Home/Self Care(Independent from home)  In-House Referral:     Discharge planning Services  CM Consult  Post Acute Care Choice:    Choice offered to:     DME Arranged:    DME Agency:     HH Arranged:    HH Agency:     Status of Service:  In process, will continue to follow  If discussed at Long Length of Stay Meetings, dates discussed:    Additional Comments:  Maryclare Labrador, RN 03/15/2018, 3:47 PM

## 2018-03-16 ENCOUNTER — Inpatient Hospital Stay (HOSPITAL_COMMUNITY): Payer: Medicare Other

## 2018-03-16 MED ORDER — SORBITOL 70 % PO SOLN
30.0000 mL | Freq: Once | ORAL | Status: AC
  Administered 2018-03-16: 30 mL via ORAL
  Filled 2018-03-16: qty 30

## 2018-03-16 NOTE — Progress Notes (Addendum)
      [address]       [place] [address]             [phone]      8 Days Post-Op Procedure(s) (LRB): VIDEO ASSISTED THORACOSCOPY (VATS)/ LOBECTOMY (Left) Subjective: She is having more pain on her left side this morning.   Objective: Vital signs in last 24 hours: Temp:  [98.3 F (36.8 C)-100.2 F (37.9 C)] 98.3 F (36.8 C) (02/26 0728) Pulse Rate:  [70-87] 82 (02/26 0300) Cardiac Rhythm: Normal sinus rhythm (02/26 0737) Resp:  [17-24] 21 (02/26 0728) BP: (104-116)/(26-55) 116/47 (02/26 0728) SpO2:  [93 %-98 %] 98 % (02/26 0300)    Intake/Output from previous day: 02/25 0701 - 02/26 0700 In: -  Out: 158 [Chest Tube:158] Intake/Output this shift: No intake/output data recorded.  General appearance: alert, cooperative and no distress Heart: regular rate and rhythm, S1, S2 normal, no murmur, click, rub or gallop Lungs: clear to auscultation bilaterally Abdomen: soft, non-tender; bowel sounds normal; no masses,  no organomegaly Extremities: extremities normal, atraumatic, no cyanosis or edema Wound: clean and dry  Lab Results: Recent Labs    03/15/18 0313  WBC 9.4  HGB 9.7*  HCT 29.1*  PLT 261   BMET:  Recent Labs    03/15/18 0313  NA 132*  K 3.5  CL 96*  CO2 28  GLUCOSE 105*  BUN 5*  CREATININE 0.73  CALCIUM 8.8*    PT/INR: No results for input(s): LABPROT, INR in the last 72 hours. ABG    Component Value Date/Time   PHART 7.474 (H) 03/09/2018 0446   HCO3 29.1 (H) 03/09/2018 0446   TCO2 30 03/09/2018 0446   ACIDBASEDEF 1.0 06/21/2015 1259   O2SAT 97.0 03/09/2018 0446   CBG (last 3)  Recent Labs    03/15/18 1141 03/15/18 1535 03/15/18 2115  GLUCAP 111* 101* 99    Assessment/Plan: S/P Procedure(s) (LRB): VIDEO ASSISTED THORACOSCOPY (VATS)/ LOBECTOMY (Left)   1. CV-NSR in the 80s, BP well controlled.  2. Pulm-tolerating room air with good oxygen saturation. Tried water seal however, CXR was worse this morning and  patient having more pain.  3. Renal-creatinine 0.73, potassium 3.5 4. H and H 9.7/29.1, expected acute blood loss anemia 5. Endo- CBGs 112, 137, and 94-well controlled 6. DVT prophylaxis-continue lovenox  Plan: switch back to 10cm of suction. CXR in the am. Constipation-dolcolax ordered.    LOS: 8 days    Elgie Collard 03/16/2018  will leave on suction until Friday then try water seal Will need to go home with miniexpress when water seal is safe patient examined and medical record reviewed,agree with above note. [name] 03/16/2018

## 2018-03-16 NOTE — Plan of Care (Signed)
  Problem: Activity: Goal: Risk for activity intolerance will decrease 03/16/2018 0055 by Mikey Bussing, RN Outcome: Progressing 03/16/2018 0055 by Mikey Bussing, RN Outcome: Progressing   Problem: Cardiac: Goal: Will achieve and/or maintain hemodynamic stability 03/16/2018 0055 by Mikey Bussing, RN Outcome: Progressing 03/16/2018 0055 by Mikey Bussing, RN Outcome: Progressing   Problem: Clinical Measurements: Goal: Postoperative complications will be avoided or minimized 03/16/2018 0055 by Mikey Bussing, RN Outcome: Progressing 03/16/2018 0055 by Mikey Bussing, RN Outcome: Adequate for Discharge   Problem: Respiratory: Goal: Respiratory status will improve 03/16/2018 0055 by Mikey Bussing, RN Outcome: Progressing 03/16/2018 0055 by Mikey Bussing, RN Outcome: Adequate for Discharge   Problem: Pain Management: Goal: Pain level will decrease 03/16/2018 0055 by Mikey Bussing, RN Outcome: Progressing 03/16/2018 0055 by Mikey Bussing, RN Outcome: Progressing   Problem: Skin Integrity: Goal: Wound healing without signs and symptoms infection will improve 03/16/2018 0055 by Mikey Bussing, RN Outcome: Progressing 03/16/2018 0055 by Mikey Bussing, RN Outcome: Adequate for Discharge   Problem: Education: Goal: Knowledge of General Education information will improve Description Including pain rating scale, medication(s)/side effects and non-pharmacologic comfort measures 03/16/2018 0055 by Mikey Bussing, RN Outcome: Progressing 03/16/2018 0055 by Mikey Bussing, RN Outcome: Adequate for Discharge   Problem: Health Behavior/Discharge Planning: Goal: Ability to manage health-related needs will improve 03/16/2018 0055 by Mikey Bussing, RN Outcome: Progressing 03/16/2018 0055 by Mikey Bussing, RN Outcome: Adequate for Discharge   Problem: Clinical Measurements: Goal: Ability to maintain clinical measurements within normal limits will improve 03/16/2018 0055 by Mikey Bussing, RN Outcome: Progressing 03/16/2018 0055 by  Mikey Bussing, RN Outcome: Adequate for Discharge Goal: Will remain free from infection 03/16/2018 0055 by Mikey Bussing, RN Outcome: Progressing 03/16/2018 0055 by Mikey Bussing, RN Outcome: Adequate for Discharge Goal: Diagnostic test results will improve 03/16/2018 0055 by Mikey Bussing, RN Outcome: Progressing 03/16/2018 0055 by Mikey Bussing, RN Outcome: Progressing Goal: Respiratory complications will improve Outcome: Progressing Goal: Cardiovascular complication will be avoided Outcome: Progressing   Problem: Activity: Goal: Risk for activity intolerance will decrease Outcome: Progressing   Problem: Nutrition: Goal: Adequate nutrition will be maintained Outcome: Progressing   Problem: Coping: Goal: Level of anxiety will decrease Outcome: Progressing   Problem: Elimination: Goal: Will not experience complications related to bowel motility Outcome: Progressing Goal: Will not experience complications related to urinary retention Outcome: Progressing   Problem: Pain Managment: Goal: General experience of comfort will improve Outcome: Progressing   Problem: Safety: Goal: Ability to remain free from injury will improve Outcome: Progressing   Problem: Skin Integrity: Goal: Risk for impaired skin integrity will decrease Outcome: Progressing

## 2018-03-16 NOTE — Care Management Important Message (Signed)
Important Message  Patient Details  Name: Rhonda Hughes MRN: [id number] Date of Birth: August 02, 1940   Medicare Important Message Given:  Yes    Orbie Pyo 03/16/2018, 3:20 PM

## 2018-03-17 ENCOUNTER — Other Ambulatory Visit: Payer: Self-pay | Admitting: *Deleted

## 2018-03-17 ENCOUNTER — Inpatient Hospital Stay (HOSPITAL_COMMUNITY): Payer: Medicare Other

## 2018-03-17 MED ORDER — PHENOL 1.4 % MT LIQD: 1.0000 | OROMUCOSAL | Status: DC | PRN

## 2018-03-17 MED ORDER — SORBITOL 70 % SOLN
60.0000 mL | Freq: Once | Status: AC
  Administered 2018-03-17: 60 mL via ORAL
  Filled 2018-03-17: qty 60

## 2018-03-17 MED ORDER — POLYETHYLENE GLYCOL 3350 17 G PO PACK
17.0000 g | PACK | Freq: Every day | ORAL | Status: DC
  Administered 2018-03-17: 17 g via ORAL
  Filled 2018-03-17 (×2): qty 1

## 2018-03-17 MED ORDER — KETOROLAC TROMETHAMINE 15 MG/ML IJ SOLN: 15.0000 mg | Freq: Four times a day (QID) | INTRAMUSCULAR | Status: AC | PRN

## 2018-03-17 MED ORDER — ONDANSETRON HCL 4 MG/2ML IJ SOLN: 4.0000 mg | Freq: Four times a day (QID) | INTRAMUSCULAR | Status: DC | PRN

## 2018-03-17 MED ORDER — SORBITOL 70 % PO SOLN: 60.0000 mL | Freq: Once | ORAL | Status: DC

## 2018-03-17 MED ORDER — MENTHOL 3 MG MT LOZG: 1.0000 | LOZENGE | OROMUCOSAL | Status: DC | PRN

## 2018-03-17 NOTE — Progress Notes (Signed)
The proposed treatment discussed in cancer conference 03/17/2018 is for discussion purpose only and is not a binding recommendation.  The patient was not physically examined nor present for their treatment options.  Therefore, final treatment plans cannot be decided.

## 2018-03-17 NOTE — Progress Notes (Signed)
Pt has refused all Ensure for the day. Does not want to drink.

## 2018-03-17 NOTE — Progress Notes (Addendum)
      [address]       [place] [address]             [phone]      9 Days Post-Op Procedure(s) (LRB): VIDEO ASSISTED THORACOSCOPY (VATS)/ LOBECTOMY (Left) Subjective: In pain this morning and afraid to move. Encouraged to drink more water. Only diet coke in her room.  Objective: Vital signs in last 24 hours: Temp:  [97.9 F (36.6 C)-100.5 F (38.1 C)] 98.4 F (36.9 C) (02/27 0734) Pulse Rate:  [84-93] 93 (02/27 0734) Cardiac Rhythm: Normal sinus rhythm (02/27 0734) Resp:  [11-21] 14 (02/27 0631) BP: (91-113)/(40-58) 109/40 (02/27 0734) SpO2:  [98 %-99 %] 98 % (02/27 0354)     Intake/Output from previous day: 02/26 0701 - 02/27 0700 In: 730 [P.O.:720; I.V.:10] Out: 235 [Chest Tube:235] Intake/Output this shift: Total I/O In: 120 [P.O.:120] Out: -   General appearance: alert, cooperative and no distress Heart: regular rate and rhythm, S1, S2 normal, no murmur, click, rub or gallop Lungs: clear to auscultation bilaterally Abdomen: soft, non-tender; bowel sounds normal; no masses,  no organomegaly Extremities: extremities normal, atraumatic, no cyanosis or edema Wound: clean and dry  Lab Results: Recent Labs    03/15/18 0313  WBC 9.4  HGB 9.7*  HCT 29.1*  PLT 261   BMET:  Recent Labs    03/15/18 0313  NA 132*  K 3.5  CL 96*  CO2 28  GLUCOSE 105*  BUN 5*  CREATININE 0.73  CALCIUM 8.8*    PT/INR: No results for input(s): LABPROT, INR in the last 72 hours. ABG    Component Value Date/Time   PHART 7.474 (H) 03/09/2018 0446   HCO3 29.1 (H) 03/09/2018 0446   TCO2 30 03/09/2018 0446   ACIDBASEDEF 1.0 06/21/2015 1259   O2SAT 97.0 03/09/2018 0446   CBG (last 3)  Recent Labs    03/15/18 1141 03/15/18 1535 03/15/18 2115  GLUCAP 111* 101* 99    Assessment/Plan: S/P Procedure(s) (LRB): VIDEO ASSISTED THORACOSCOPY (VATS)/ LOBECTOMY (Left)   1. CV-NSR in the 80s, BP low to normal.   2. Pulm-tolerating room air with good  oxygen saturation. On 10cm of suction.  CXR much better this morning with lung more expanded.  3. Renal-creatinine 0.73, potassium 3.5-no new labs 4. H and H 9.7/29.1, expected acute blood loss anemia 5. Endo- CBGs 111, 101, 99-well controlled 6. DVT prophylaxis-continue lovenox  Plan: keep on suction today and maybe try water seal tomorrow. Will add Toradol for pain and order a BMET for tomorrow. Still no bowel movement so will add miralax.    LOS: 9 days    Rhonda Hughes 03/17/2018  Persistent air leak from staple lines in emphysematous, tin fragile lung parenchyma Leave suction at 10 cm for now patient examined and medical record reviewed,agree with above note. Rhonda Hughes 03/17/2018

## 2018-03-18 ENCOUNTER — Inpatient Hospital Stay (HOSPITAL_COMMUNITY): Payer: Medicare Other

## 2018-03-18 LAB — BASIC METABOLIC PANEL
Anion gap: 12 (ref 5–15)
BUN: 7 mg/dL — ABNORMAL LOW (ref 8–23)
CO2: 27 mmol/L (ref 22–32)
Calcium: 8.9 mg/dL (ref 8.9–10.3)
Chloride: 89 mmol/L — ABNORMAL LOW (ref 98–111)
Creatinine, Ser: 0.83 mg/dL (ref 0.44–1.00)
GFR calc Af Amer: >60 mL/min (ref >60)
GFR calc non Af Amer: >60 mL/min (ref >60)
Glucose, Bld: 104 mg/dL — ABNORMAL HIGH (ref 70–99)
Potassium: 3.2 mmol/L — ABNORMAL LOW (ref 3.5–5.1)
Sodium: 128 mmol/L — ABNORMAL LOW (ref 135–145)

## 2018-03-18 LAB — TYPE AND SCREEN
ABO/RH(D): O NEG
Antibody Screen: NEGATIVE
Unit division: 0
Unit division: 0

## 2018-03-18 LAB — BPAM RBC
Blood Product Expiration Date: 202003032359
Blood Product Expiration Date: 202003032359
ISSUE DATE / TIME: 202002251242
Unit Type and Rh: 9500
Unit Type and Rh: 9500

## 2018-03-18 MED ORDER — POTASSIUM CHLORIDE CRYS ER 20 MEQ PO TBCR
40.0000 meq | EXTENDED_RELEASE_TABLET | Freq: Two times a day (BID) | ORAL | Status: AC
  Administered 2018-03-18 (×2): 40 meq via ORAL
  Filled 2018-03-18 (×2): qty 2

## 2018-03-18 NOTE — Care Management Note (Addendum)
Case Management Note  Patient Details  Name: Rhonda Hughes MRN: [id number] Date of Birth: [birth year]  Subjective/Objective:     Pt is s/p VATS               Action/Plan:   PTA independent from home with husband. Pt has PCP and denied barriers with paying for prescription medications.  Pt is interested in [facility]  If she discharges home with mini express - CM provided medicare.gov HH list.     Expected Discharge Date:                  Expected Discharge Plan:  Home/Self Care(Independent from home)  In-House Referral:     Discharge planning Services  CM Consult  Post Acute Care Choice:    Choice offered to:     DME Arranged:    DME Agency:     HH Arranged:    HH Agency:     Status of Service:  In process, will continue to follow  If discussed at Long Length of Stay Meetings, dates discussed:    Additional Comments: 03/18/2018 Pt remains with CT - now to waterseal.  It is not yet know at this time if pt will need mini express.  Pt informed CM that she still has not had time to chose [facility] agency.   Maryclare Labrador, RN 03/18/2018, 4:13 PM

## 2018-03-18 NOTE — Plan of Care (Signed)
  Problem: Activity: Goal: Risk for activity intolerance will decrease Outcome: Progressing   Problem: Cardiac: Goal: Will achieve and/or maintain hemodynamic stability Outcome: Progressing   Problem: Clinical Measurements: Goal: Postoperative complications will be avoided or minimized Outcome: Progressing   Problem: Respiratory: Goal: Respiratory status will improve Outcome: Progressing   Problem: Pain Management: Goal: Pain level will decrease Outcome: Progressing   Problem: Skin Integrity: Goal: Wound healing without signs and symptoms infection will improve Outcome: Progressing   Problem: Education: Goal: Knowledge of General Education information will improve Description Including pain rating scale, medication(s)/side effects and non-pharmacologic comfort measures Outcome: Progressing   Problem: Health Behavior/Discharge Planning: Goal: Ability to manage health-related needs will improve Outcome: Progressing   Problem: Clinical Measurements: Goal: Ability to maintain clinical measurements within normal limits will improve Outcome: Progressing Goal: Will remain free from infection Outcome: Progressing Goal: Diagnostic test results will improve Outcome: Progressing Goal: Respiratory complications will improve Outcome: Progressing Goal: Cardiovascular complication will be avoided Outcome: Progressing   Problem: Activity: Goal: Risk for activity intolerance will decrease Outcome: Progressing   Problem: Nutrition: Goal: Adequate nutrition will be maintained Outcome: Progressing   Problem: Coping: Goal: Level of anxiety will decrease Outcome: Progressing   Problem: Elimination: Goal: Will not experience complications related to bowel motility Outcome: Progressing Goal: Will not experience complications related to urinary retention Outcome: Progressing   Problem: Pain Managment: Goal: General experience of comfort will improve Outcome: Progressing    Problem: Safety: Goal: Ability to remain free from injury will improve Outcome: Progressing   Problem: Skin Integrity: Goal: Risk for impaired skin integrity will decrease Outcome: Progressing

## 2018-03-18 NOTE — Progress Notes (Addendum)
      [address]       [place] [address]             [phone]       10 Days Post-Op Procedure(s) (LRB): VIDEO ASSISTED THORACOSCOPY (VATS)/ LOBECTOMY (Left)  Subjective: Patient does not like food or protein shakes. She had a bowel movement yesterday. Patient is frustrated because lung is taking awhile to "heal".  Objective: Vital signs in last 24 hours: Temp:  [97.8 F (36.6 C)-99.5 F (37.5 C)] 98.3 F (36.8 C) (02/28 0239) Pulse Rate:  [81-93] 84 (02/28 0239) Cardiac Rhythm: Normal sinus rhythm (02/28 0300) Resp:  [15-28] 15 (02/28 0239) BP: (106-131)/(40-57) 110/55 (02/28 0239) SpO2:  [95 %-100 %] 99 % (02/28 0239)      Intake/Output from previous day: 02/27 0701 - 02/28 0700 In: 1090 [P.O.:1080; I.V.:10] Out: 175 [Urine:125; Chest Tube:50]   Physical Exam:  Cardiovascular: RRR. Pulmonary: Diminished right apex and base, left lung is clear Abdomen: Soft, non tender, bowel sounds present. Extremities: Mild bilateral lower extremity edema. Wounds: Clean and dry.  No erythema or signs of infection. Chest Tube: to suction, air leak  Lab Results: CBC:No results for input(s): WBC, HGB, HCT, PLT in the last 72 hours. BMET:  Recent Labs    03/18/18 0233  NA 128*  K 3.2*  CL 89*  CO2 27  GLUCOSE 104*  BUN 7*  CREATININE 0.83  CALCIUM 8.9    PT/INR: No results for input(s): LABPROT, INR in the last 72 hours. ABG:  INR: Will add last result for INR, ABG once components are confirmed Will add last 4 CBG results once components are confirmed  Assessment/Plan:  1. CV - SR in the 70-80's. On Lopressor 12.5 mg bid and Lisinopril 5 mg daily. Parameters on Lopressor. Will stop Lisinopril as BP more labile. 2.  Pulmonary - On room air. Chest tube is to suction and there is a+2-3 air leak. Chest tube with 50 cc of output last 24 hours. CXR this am appears to show stable right pneumothorax and pleural effusion . Will discuss with Dr. Prescott Gum if should place chest tube to water seal (if mini express being considered). Encourage incentive spirometer. 3. Supplement potassium 4. Hyponatremia-sodium decreased to 128. Will discuss with Dr. Prescott Gum if should supplement 5. GI-encourage po. I instructed patient to have family member bring in food of her choice.  Donielle M ZimmermanPA-C 03/18/2018,7:27 AM [phone]  place chest tube to water seal, cxr in am If apical pntx no worse she can go home with miniexpress and office appt w/ cxr in one week  patient examined and medical record reviewed,agree with above note. [name] 03/18/2018

## 2018-03-19 ENCOUNTER — Inpatient Hospital Stay (HOSPITAL_COMMUNITY): Payer: Medicare Other

## 2018-03-19 LAB — BASIC METABOLIC PANEL
Anion gap: 13 (ref 5–15)
BUN: 7 mg/dL — ABNORMAL LOW (ref 8–23)
CO2: 19 mmol/L — ABNORMAL LOW (ref 22–32)
Calcium: 8.3 mg/dL — ABNORMAL LOW (ref 8.9–10.3)
Chloride: 99 mmol/L (ref 98–111)
Creatinine, Ser: 0.65 mg/dL (ref 0.44–1.00)
GFR calc Af Amer: >60 mL/min (ref >60)
GFR calc non Af Amer: >60 mL/min (ref >60)
Glucose, Bld: 85 mg/dL (ref 70–99)
Potassium: 4.4 mmol/L (ref 3.5–5.1)
Sodium: 131 mmol/L — ABNORMAL LOW (ref 135–145)

## 2018-03-19 MED ORDER — TRAMADOL HCL 50 MG PO TABS: 50.0000 mg | ORAL_TABLET | Freq: Four times a day (QID) | ORAL | 0 refills | Status: DC | PRN

## 2018-03-19 NOTE — Care Management Note (Addendum)
Case Management Note  Patient Details  Name: Rhonda Hughes MRN: [id number] Date of Birth: [birth year]  Subjective/Objective:   Patient for dc today, they chose [facility] from the Medicare.gov list. Referral given to [facility].  NCM faxing information over to them per the Charge RN, they only need to do an initial assessment and then see the patient a week after  To make sure lung sounds are good, and come over if patient c/o sob.  Soc will begin 24-48 hrs post dc.   Her PCP is Dr. Etter Sjogren at [facility] per patient.                  Action/Plan: DC home when ready.  Expected Discharge Date:  03/19/18               Expected Discharge Plan:  [facility](Independent from home)  In-House Referral:     Discharge planning Services  CM Consult  Post Acute Care Choice:  Home Health Choice offered to:  Patient  DME Arranged:    DME Agency:     HH Arranged:  RN [place] Agency:  [facility]  Status of Service:  Completed, signed off  If discussed at [place] of Stay Meetings, dates discussed:    Additional Comments:  Rhonda Mayo, RN 03/19/2018, 10:13 AM

## 2018-03-19 NOTE — Discharge Instructions (Signed)
Discharge Instructions:  1. You may sponge bath.  No showers or tub bathing until chest tube has been removed 2. No Driving while using narcotic pain medications and until cleared by Dr. Lucianne Lei Trigt's office 3. Contact office if you develop worsening shortness of breath or sudden on set chest discomfort at [phone] 4. Resume home heart healthy diet 5. Activity- up as tolerated, please ambulate 3x per day, no strenuous activity or heavy lifting

## 2018-03-19 NOTE — Progress Notes (Signed)
Pt got discharged to home, discharge instructions provided and patient showed understanding to it, IV taken out,Telemonitor DC,pt left unit in wheelchair with all of the belongings accompanied with a family member (Husband). Mini express chest tube home guide is provided and educated patient and her husband taking care of this.  Palma Holter, RN

## 2018-03-19 NOTE — Progress Notes (Addendum)
      [address]       [place] [address]             [phone]      11 Days Post-Op Procedure(s) (LRB): VIDEO ASSISTED THORACOSCOPY (VATS)/ LOBECTOMY (Left)   Subjective:  No new complaints.  Ready to go home.  Objective: Vital signs in last 24 hours: Temp:  [97.6 F (36.4 C)-98.7 F (37.1 C)] 98.7 F (37.1 C) (02/29 0727) Pulse Rate:  [68-88] 88 (02/29 0727) Cardiac Rhythm: Normal sinus rhythm (02/29 0400) Resp:  [13-19] 16 (02/29 0727) BP: (101-118)/(45-69) 117/55 (02/29 0727) SpO2:  [91 %-98 %] 93 % (02/29 0727)  Intake/Output from previous day: 02/28 0701 - 02/29 0700 In: 480 [P.O.:480] Out: 430 [Urine:400; Chest Tube:30]  General appearance: alert, cooperative and no distress Heart: regular rate and rhythm Lungs: clear to auscultation bilaterally Abdomen: soft, non-tender; bowel sounds normal; no masses,  no organomegaly Wound: clean and dry  Lab Results: No results for input(s): WBC, HGB, HCT, PLT in the last 72 hours. BMET:  Recent Labs    03/18/18 0233 03/19/18 0240  NA 128* 131*  K 3.2* 4.4  CL 89* 99  CO2 27 19*  GLUCOSE 104* 85  BUN 7* 7*  CREATININE 0.83 0.65  CALCIUM 8.9 8.3*    PT/INR: No results for input(s): LABPROT, INR in the last 72 hours. ABG    Component Value Date/Time   PHART 7.474 (H) 03/09/2018 0446   HCO3 29.1 (H) 03/09/2018 0446   TCO2 30 03/09/2018 0446   ACIDBASEDEF 1.0 06/21/2015 1259   O2SAT 97.0 03/09/2018 0446   CBG (last 3)  No results for input(s): GLUCAP in the last 72 hours.  Assessment/Plan: S/P Procedure(s) (LRB): VIDEO ASSISTED THORACOSCOPY (VATS)/ LOBECTOMY (Left)  1. Chest tube- + air leak, CXR remains stable- will convert to Mini Express today 2. CV- hemodynamically stable 3. Gi- oral intake improved some, will continue supplements at home 4. Dispo- patient stable, will place chest tube to Mini Express today, d/c home   LOS: 11 days    Ellwood Handler 03/19/2018  going home  today with ct in place on mini express- discussed with patient and husband I have seen and examined Rhonda Hughes and agree with the above assessment  and plan.  Grace Isaac MD Beeper [phone] Office [phone] 03/19/2018 10:40 AM

## 2018-03-21 ENCOUNTER — Encounter: Payer: Self-pay | Admitting: *Deleted

## 2018-03-21 ENCOUNTER — Telehealth: Payer: Self-pay

## 2018-03-21 ENCOUNTER — Other Ambulatory Visit: Payer: Self-pay

## 2018-03-21 DIAGNOSIS — C3412 Malignant neoplasm of upper lobe, left bronchus or lung: Secondary | ICD-10-CM | POA: Diagnosis not present

## 2018-03-21 DIAGNOSIS — E876 Hypokalemia: Secondary | ICD-10-CM

## 2018-03-21 DIAGNOSIS — Z4803 Encounter for change or removal of drains: Secondary | ICD-10-CM | POA: Diagnosis not present

## 2018-03-21 MED ORDER — POTASSIUM CHLORIDE ER 20 MEQ PO TBCR: 20.0000 meq | EXTENDED_RELEASE_TABLET | Freq: Four times a day (QID) | ORAL | 0 refills | Status: DC

## 2018-03-21 MED ORDER — POTASSIUM CHLORIDE ER 20 MEQ PO TBCR: 20.0000 meq | EXTENDED_RELEASE_TABLET | Freq: Every day | ORAL | 0 refills | Status: DC

## 2018-03-21 NOTE — Telephone Encounter (Signed)
Patient requested medication change as patient was to take packet potassium supplement.  Patient wished to take pill form.  Received correct pharmacy and sent in Potassium 20MEQ 4 times a day for one day.

## 2018-03-21 NOTE — Progress Notes (Signed)
Oncology Nurse Navigator Documentation  Oncology Nurse Navigator Flowsheets 03/21/2018  Navigator Location [facility]  Navigator Encounter Type Other/I followed up with Dr. Julien Nordmann on when he would like to see her for follow up.  He would like to see her either 3/23 or 3/24.  I completed scheduling message for her to be called with an appt and labs.   Treatment Initiated Date 03/08/2018  Barriers/Navigation Needs Coordination of Care  Interventions Coordination of Care  Coordination of Care Other  Acuity Level 2  Time Spent with Patient 30

## 2018-03-22 ENCOUNTER — Telehealth: Payer: Self-pay | Admitting: Internal Medicine

## 2018-03-22 NOTE — Telephone Encounter (Signed)
Scheduled appt per 3/2 sch message - pt aware of appt date and time - reminder letter sent in the mail.

## 2018-03-23 ENCOUNTER — Ambulatory Visit: Payer: Medicare Other | Admitting: Cardiothoracic Surgery

## 2018-03-29 ENCOUNTER — Telehealth: Payer: Self-pay

## 2018-03-29 NOTE — Telephone Encounter (Signed)
Rhonda Hughes from [facility] contacted the office [phone] requesting verbal orders to extend Rhonda Hughes's home health nursing for one week due to patient's need for rescheduled appointment here at the office.  Verbal orders given, will await faxed copy for physician signature.

## 2018-04-04 ENCOUNTER — Other Ambulatory Visit: Payer: Self-pay | Admitting: *Deleted

## 2018-04-04 ENCOUNTER — Other Ambulatory Visit: Payer: Self-pay

## 2018-04-04 DIAGNOSIS — C3490 Malignant neoplasm of unspecified part of unspecified bronchus or lung: Secondary | ICD-10-CM

## 2018-04-04 NOTE — Progress Notes (Unsigned)
Dg  

## 2018-04-05 ENCOUNTER — Encounter: Payer: Self-pay | Admitting: Cardiothoracic Surgery

## 2018-04-05 ENCOUNTER — Ambulatory Visit (INDEPENDENT_AMBULATORY_CARE_PROVIDER_SITE_OTHER): Payer: Self-pay | Admitting: Cardiothoracic Surgery

## 2018-04-05 ENCOUNTER — Ambulatory Visit
Admission: RE | Admit: 2018-04-05 | Discharge: 2018-04-05 | Disposition: A | Discharge status: Home / Self Care | Payer: Medicare Other | Source: Ambulatory Visit | Attending: Cardiothoracic Surgery | Admitting: Cardiothoracic Surgery

## 2018-04-05 ENCOUNTER — Ambulatory Visit: Payer: Medicare Other | Admitting: Cardiothoracic Surgery

## 2018-04-05 VITALS — BP 104/56 | HR 76 | Resp 20 | Ht 61.5 in | Wt 112.0 lb

## 2018-04-05 DIAGNOSIS — Z902 Acquired absence of lung [part of]: Secondary | ICD-10-CM

## 2018-04-05 DIAGNOSIS — C3412 Malignant neoplasm of upper lobe, left bronchus or lung: Secondary | ICD-10-CM

## 2018-04-05 DIAGNOSIS — Z951 Presence of aortocoronary bypass graft: Secondary | ICD-10-CM

## 2018-04-05 DIAGNOSIS — C3490 Malignant neoplasm of unspecified part of unspecified bronchus or lung: Secondary | ICD-10-CM

## 2018-04-05 NOTE — Progress Notes (Signed)
PCP is System, Provider Not In Referring Provider is Leonie Man, MD  Chief Complaint  Patient presents with  . 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  PET scan showed no other metastatic disease and brain MRI was negative.  Mediastinal nodes were negative, margins negative.  Patient had a prolonged air leak because of her severe underlying COPD and went home with a chest tube to waterseal.  Today her chest x-ray shows significant improvement with almost full reexpansion of the left lung.  There is a small apical space.  There is no evidence of air leak on examination of the tube.  The chest tube was removed in the office without incident and a airtight dressing applied.  Breath sounds are clear and equal post tube pull.   Past Medical History:  Diagnosis Date  . AKI (acute kidney injury) ([place]) 05/16/2017  . Anxiety   . Barrett's esophagus without dysplasia   . CAD (coronary artery disease)    a. LHC 10/16/11: dLM 20%, mLAD 90%, pOM1 30%, mOM1 70% (FFR not hemodynamically significant), prox and mid RCA 30%, EF 65%;  b. PCI 10/16/11: Promus DES x 2 to mLAD  . Cancer ([place])    left upper lobe  . COPD (chronic obstructive pulmonary disease) ([place])   . Depression   . Diverticulosis   . Essential hypertension   . Family history of adverse reaction to anesthesia    mother had "breathing problems"  . GERD (gastroesophageal reflux disease)   . Hyperlipidemia   . Rectocele     Past Surgical History:  Procedure Laterality Date  . ABDOMINAL HYSTERECTOMY    . CARDIAC CATHETERIZATION    . CARDIAC CATHETERIZATION N/A 06/21/2015   Procedure: Right/Left Heart Cath and Coronary Angiography;  Surgeon: [name], MD;  Location:  [place] [facility];  Service: Cardiovascular;  Laterality: N/A;  . CARDIAC CATHETERIZATION  09/16/2017  . CATARACT EXTRACTION W/ INTRAOCULAR LENS  IMPLANT, BILATERAL Bilateral   . COLONOSCOPY  01/2008   Multiple diverticula in desc and sigm colon  . CORONARY ARTERY BYPASS GRAFT N/A 09/24/2017   Procedure: CORONARY ARTERY BYPASS GRAFTING (CABG) x4 using mammary artery and saphenous vein. LIMA to LAD, SVG to RCA, SVG to RAMUS, SVT to OM. Endoscopic saphenous vein harvest.;  Surgeon: Ivin Poot, MD;  Location: [place];  Service: Open Heart Surgery;  Laterality: N/A;  . ESOPHAGOGASTRODUODENOSCOPY  01/2008   Barrett's esophagus, gastritis, no h.pylori, due follow-up 01/2011  . ESOPHAGOGASTRODUODENOSCOPY  04/06/2011   Procedure: ESOPHAGOGASTRODUODENOSCOPY (EGD);  Surgeon: Danie Binder, MD;  [facility];  Service: Endoscopy;  Laterality: N/A;  8:30  . FLEXIBLE SIGMOIDOSCOPY  08/11/2010 RECTAL PAIN/PRESSURE   SML IH, TICS  . FRACTIONAL FLOW RESERVE WIRE N/A 10/16/2011   Procedure: FRACTIONAL FLOW RESERVE WIRE;  Surgeon: [name], MD;  Location: [facility] CATH LAB;  Service: Cardiovascular;  Laterality: N/A;  . IR PERC PLEURAL DRAIN W/INDWELL CATH W/IMG GUIDE  10/01/2017  . LEFT HEART CATH AND CORONARY ANGIOGRAPHY N/A 09/16/2017   Procedure: LEFT HEART CATH AND CORONARY ANGIOGRAPHY;  Surgeon: [name], MD;  Location: [place] [facility];  Service: Cardiovascular;  Laterality: N/A;  . LEFT HEART  CATHETERIZATION WITH CORONARY ANGIOGRAM N/A 03/07/2012   Procedure: LEFT HEART CATHETERIZATION WITH CORONARY ANGIOGRAM;  Surgeon: [name], MD;  Location: [facility] CATH LAB;  Service: Cardiovascular;  Laterality: N/A;  . LEFT HEART CATHETERIZATION WITH CORONARY ANGIOGRAM N/A 12/14/2012   Procedure: LEFT HEART CATHETERIZATION WITH CORONARY ANGIOGRAM;  Surgeon: [name], MD;  Location: [facility] CATH LAB;  Service: Cardiovascular;  Laterality: N/A;  . LEFT HEART  CATHETERIZATION WITH CORONARY ANGIOGRAM N/A 05/07/2014   Procedure: LEFT HEART CATHETERIZATION WITH CORONARY ANGIOGRAM;  Surgeon: [name], MD;  Location: [facility] CATH LAB;  Service: Cardiovascular;  Laterality: N/A;  . PARTIAL HYSTERECTOMY  1980s  . TEE WITHOUT CARDIOVERSION N/A 09/24/2017   Procedure: TRANSESOPHAGEAL ECHOCARDIOGRAM (TEE);  Surgeon: Prescott Gum, Collier Salina, MD;  Location: [place];  Service: Open Heart Surgery;  Laterality: N/A;  . VIDEO ASSISTED THORACOSCOPY (VATS)/ LOBECTOMY Left 03/08/2018   Procedure: VIDEO ASSISTED THORACOSCOPY (VATS)/ LOBECTOMY;  Surgeon: Ivin Poot, MD;  Location: [facility] OR;  Service: Thoracic;  Laterality: Left;    Family History  Problem Relation Age of Onset  . Coronary artery disease Father 54  . Heart attack Father 62  . GI problems Mother        Perforated colon   . Coronary artery disease Paternal Aunt   . Coronary artery disease Paternal Uncle   . Colon cancer Neg Hx   . Gastric cancer Neg Hx   . Esophageal cancer Neg Hx     Social History Social History   Tobacco Use  . Smoking status: Former Smoker    Packs/day: 1.00    Years: 53.00    Pack years: 53.00    Types: Cigarettes    Last attempt to quit: 03/20/2007    Years since quitting: 11.0  . Smokeless tobacco: Never Used  Substance Use Topics  . Alcohol use: No  . Drug use: No    Current Outpatient Medications  Medication Sig Dispense Refill  . acetaminophen (TYLENOL) 325 MG tablet Take 2 tablets (650 mg total) by mouth every 6 (six) hours as needed for mild pain (or Fever >/= 101). (Patient taking differently: Take 325 mg by mouth every 6 (six) hours as needed for mild pain (or Fever >/= 101). )    . ALPRAZolam (XANAX) 0.25 MG tablet Take 0.25 mg by mouth 2 (two) times daily as needed for anxiety.     Marland Kitchen aspirin 325 MG tablet Take 325 mg by mouth daily.    Marland Kitchen atorvastatin (LIPITOR) 20 MG tablet Take 1 tablet (20 mg total) by mouth at bedtime. 30 tablet 11  .  Carboxymethylcellul-Glycerin (LUBRICATING EYE DROPS OP) Place 1 drop into both eyes daily as needed (dry eyes).    Marland Kitchen lisinopril (PRINIVIL,ZESTRIL) 5 MG tablet Take 1 tablet (5 mg total) by mouth daily. (Patient taking differently: Take 5 mg by mouth at bedtime. ) 30 tablet 11  . magnesium oxide (MAG-OX) 400 (241.3 Mg) MG tablet TAKE 1/2 (ONE-HALF) TABLET BY MOUTH ONCE DAILY (Patient taking differently: Take 200 mg by mouth daily. ) 45 tablet 3  . memantine (NAMENDA) 10 MG tablet Take 10 mg by mouth 2 (two) times daily.     . metoprolol tartrate (LOPRESSOR) 25 MG tablet Take 0.5 tablets (12.5 mg total) by mouth 2 (two) times daily. 60 tablet 3  . nitroGLYCERIN (NITROSTAT) 0.4 MG SL tablet Place 1 tablet (0.4 mg total) under the tongue every 5 (five) minutes as needed for chest pain. 25 tablet 3  . pantoprazole (  PROTONIX) 20 MG tablet Take 20 mg by mouth daily.    . traMADol (ULTRAM) 50 MG tablet Take 1-2 tablets (50-100 mg total) by mouth every 6 (six) hours as needed (mild pain). 30 tablet 0  . venlafaxine XR (EFFEXOR-XR) 37.5 MG 24 hr capsule Take 37.5 mg by mouth at bedtime.    Marland Kitchen zolpidem (AMBIEN) 10 MG tablet Take 10 mg by mouth at bedtime as needed for sleep.    . potassium chloride 20 MEQ TBCR Take 20 mEq by mouth 4 (four) times daily for 1 day. 4 tablet 0   No current facility-administered medications for this visit.    Facility-Administered Medications Ordered in Other Visits  Medication Dose Route Frequency Provider Last Rate Last Dose  . midazolam (VERSED) 5 MG/5ML injection    Anesthesia Intra-op Shirlyn Goltz, CRNA   2 mg at 02/07/18 0715    Allergies  Allergen Reactions  . Gadolinium Derivatives Hives and Itching    Pt began having hives and itching about 5-7 minutes after contrast administered. Dr Weber Cooks evaluated pt and had Katie RN administer 50 mg Benedryl by IV. Dr Weber Cooks cleared pt to go home.  . Adhesive [Tape] Other (See Comments)    Turns skin red  . Sulfa  Antibiotics Other (See Comments)    Childhood allergy    Review of Systems  Sternal and VATS incisions well-healed without drainage.  Some pain in the left inframammary crease from the VATS.  No shortness of breath.  Appetite suboptimal  BP (!) 104/56   Pulse 76   Resp 20   Ht 5' 1.5" (1.562 m)   Wt 112 lb (50.8 kg)   SpO2 94% Comment: RA  BMI 20.82 kg/m  Physical Exam Alert and comfortable Breath sounds clear and equal Heart rhythm regular Chest tube pulled without incident  Diagnostic Tests: Chest x-ray shows a left chest tube in place with minimal left apical space, lung fields clear.  No pleural effusion.  Impression: Resolved air leak and chest tube removed in office today. Patient has small cell cancer and has appointment to see thoracic oncology.  However she states she is not ready for chemotherapy and does not wish to discuss further therapy until she feels stronger.  Plan: I will see the patient back in 1 week with a chest x-ray. He will keep an occlusive dressing on the chest tube site for the next 72 hours.  Len Childs, MD [facility] [phone]

## 2018-04-06 ENCOUNTER — Encounter: Payer: Self-pay | Admitting: *Deleted

## 2018-04-06 NOTE — Progress Notes (Signed)
Oncology Nurse Navigator Documentation  Oncology Nurse Navigator Flowsheets 04/06/2018  Navigator Location [facility]  Referral date to RadOnc/MedOnc -  Navigator Encounter Type Other/I received a message from Dr. Darcey Nora.  He updated me that patient is not feeling well enough to discuss adjuvant chemo by 04/20/2018 and needs to be re-scheduled to mid-April.  I completed a scheduling message for them to call and cancel appt on 4/1 and re-schedule on 4/14.  Telephone -  Abnormal Finding Date -  Confirmed Diagnosis Date -  Treatment Initiated Date -  Treatment Phase -  Barriers/Navigation Needs Coordination of Care  Education -  Interventions Coordination of Care  Coordination of Care Other  Education Method -  Acuity Level 2  Acuity Level 2 -  Time Spent with Patient 15

## 2018-04-07 ENCOUNTER — Ambulatory Visit: Payer: Medicare Other | Admitting: Cardiothoracic Surgery

## 2018-04-11 ENCOUNTER — Telehealth: Payer: Self-pay | Admitting: *Deleted

## 2018-04-11 NOTE — Telephone Encounter (Signed)
Oncology Nurse Navigator Documentation  Oncology Nurse Navigator Flowsheets 04/11/2018  Navigator Location [facility]  Referral date to RadOnc/MedOnc -  Navigator Encounter Type Telephone/I received a message that Ms. Yglesias did not want to be seen for follow up.  According to notes, patient needs adjuvant chemotherapy.  I called patient to see if I could help with any information she needed.  She states she did not was to come and be seen.  She does not want any more treatment.  I explained about her treatment plan again she does not want treatment.  She said she will talk with Dr. Darcey Nora about it.  She sees him on 04/13/2018.  I told her I am here if she needed me to answer any questions.  I will update Dr. Darcey Nora.   Telephone Outgoing Call  Abnormal Finding Date 09/17/2017  Confirmed Diagnosis Date 01/07/2018  Surgery Date 03/08/2018  Treatment Initiated Date -  Treatment Phase Follow-up  Barriers/Navigation Needs Education;Coordination of Care  Education Other  Interventions Coordination of Care;Education  Coordination of Care Other  Education Method Verbal  Acuity Level 2  Acuity Level 2 -  Time Spent with Patient 30

## 2018-04-12 ENCOUNTER — Other Ambulatory Visit: Payer: Self-pay | Admitting: *Deleted

## 2018-04-12 ENCOUNTER — Other Ambulatory Visit: Payer: Self-pay

## 2018-04-12 DIAGNOSIS — Z902 Acquired absence of lung [part of]: Secondary | ICD-10-CM

## 2018-04-12 DIAGNOSIS — C3412 Malignant neoplasm of upper lobe, left bronchus or lung: Secondary | ICD-10-CM

## 2018-04-13 ENCOUNTER — Encounter: Payer: Self-pay | Admitting: Cardiothoracic Surgery

## 2018-04-13 ENCOUNTER — Other Ambulatory Visit: Payer: Self-pay

## 2018-04-13 ENCOUNTER — Ambulatory Visit
Admission: RE | Admit: 2018-04-13 | Discharge: 2018-04-13 | Disposition: A | Discharge status: Home / Self Care | Payer: Medicare Other | Source: Ambulatory Visit | Attending: Cardiothoracic Surgery | Admitting: Cardiothoracic Surgery

## 2018-04-13 ENCOUNTER — Ambulatory Visit (INDEPENDENT_AMBULATORY_CARE_PROVIDER_SITE_OTHER): Payer: Self-pay | Admitting: Cardiothoracic Surgery

## 2018-04-13 VITALS — BP 102/58 | HR 68 | Temp 97.2°F | Resp 20 | Ht 61.5 in | Wt 106.0 lb

## 2018-04-13 DIAGNOSIS — Z902 Acquired absence of lung [part of]: Secondary | ICD-10-CM

## 2018-04-13 DIAGNOSIS — Z951 Presence of aortocoronary bypass graft: Secondary | ICD-10-CM

## 2018-04-13 DIAGNOSIS — C3412 Malignant neoplasm of upper lobe, left bronchus or lung: Secondary | ICD-10-CM

## 2018-04-13 NOTE — Progress Notes (Signed)
PCP is System, Provider Not In Referring Provider is Leonie Man, MD  Chief Complaint  Patient presents with  . Routine Post Op    1 week f/u with CXR    HPI: Patient returns for one-week follow-up with chest x-ray.  Last week in the office the patient's left chest tube was removed.  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  She has done well without complaints.  Incisions are clean and dry.  Chest x-ray today shows full reexpansion of the left lung, no pneumothorax.  Patient denies any pain in his anxious to get back into her regular routine.  We discussed at length the option of postoperative adjunct of chemotherapy for the diagnosis of small cell cancer.  Currently she is emotionally unable to participate in any evaluation or discussion about further medical care since she has had a prolonged period of major surgery including emergency CABG and recovery followed by left upper lobectomy for cancer resection.  Patient will return in 1 month and I will discuss with her again the importance of considering adjunctive chemotherapy for diagnosis of small cell cancer.  She understands that adjunctive chemotherapy potentially improve her survival and chance of cure and that the oncologist would provide her with more details regarding the specifics of treatment and prognosis.  Past Medical History:  Diagnosis Date  . AKI (acute kidney injury) ([place]) 05/16/2017  . Anxiety   . Barrett's esophagus without dysplasia   . CAD (coronary artery disease)    a. LHC 10/16/11: dLM 20%, mLAD 90%, pOM1 30%, mOM1 70% (FFR not hemodynamically significant), prox and mid RCA 30%, EF 65%;  b. PCI 10/16/11: Promus DES x 2 to mLAD  . Cancer ([place])    left upper lobe  . COPD (chronic obstructive pulmonary disease) ([place])   . Depression   . Diverticulosis   . Essential hypertension   . Family history of adverse reaction to anesthesia    mother had "breathing  problems"  . GERD (gastroesophageal reflux disease)   . Hyperlipidemia   . Rectocele     Past Surgical History:  Procedure Laterality Date  . ABDOMINAL HYSTERECTOMY    . CARDIAC CATHETERIZATION    . CARDIAC CATHETERIZATION N/A 06/21/2015   Procedure: Right/Left Heart Cath and Coronary Angiography;  Surgeon: [name], MD;  Location: [place] [facility];  Service: Cardiovascular;  Laterality: N/A;  . CARDIAC CATHETERIZATION  09/16/2017  . CATARACT EXTRACTION W/ INTRAOCULAR LENS  IMPLANT, BILATERAL Bilateral   . COLONOSCOPY  01/2008   Multiple diverticula in desc and sigm colon  . CORONARY ARTERY BYPASS GRAFT N/A 09/24/2017   Procedure: CORONARY ARTERY BYPASS GRAFTING (CABG) x4 using mammary artery and saphenous vein. LIMA to LAD, SVG to RCA, SVG to RAMUS, SVT to OM. Endoscopic saphenous vein harvest.;  Surgeon: Ivin Poot, MD;  Location: [place];  Service: Open Heart Surgery;  Laterality: N/A;  . ESOPHAGOGASTRODUODENOSCOPY  01/2008   Barrett's esophagus, gastritis, no h.pylori, due follow-up 01/2011  . ESOPHAGOGASTRODUODENOSCOPY  04/06/2011   Procedure: ESOPHAGOGASTRODUODENOSCOPY (EGD);  Surgeon: Danie Binder, MD;  [facility];  Service: Endoscopy;  Laterality: N/A;  8:30  . FLEXIBLE SIGMOIDOSCOPY  08/11/2010 RECTAL PAIN/PRESSURE   SML IH, TICS  . FRACTIONAL FLOW RESERVE WIRE N/A 10/16/2011   Procedure: FRACTIONAL FLOW RESERVE WIRE;  Surgeon: [name], MD;  Location: [facility] CATH LAB;  Service: Cardiovascular;  Laterality: N/A;  .  IR PERC PLEURAL DRAIN W/INDWELL CATH W/IMG GUIDE  10/01/2017  . LEFT HEART CATH AND CORONARY ANGIOGRAPHY N/A 09/16/2017   Procedure: LEFT HEART CATH AND CORONARY ANGIOGRAPHY;  Surgeon: [name], MD;  Location: [place] [facility];  Service: Cardiovascular;  Laterality: N/A;  . LEFT HEART CATHETERIZATION WITH CORONARY ANGIOGRAM N/A 03/07/2012   Procedure: LEFT HEART CATHETERIZATION WITH CORONARY ANGIOGRAM;  Surgeon:  [name], MD;  Location: [facility] CATH LAB;  Service: Cardiovascular;  Laterality: N/A;  . LEFT HEART CATHETERIZATION WITH CORONARY ANGIOGRAM N/A 12/14/2012   Procedure: LEFT HEART CATHETERIZATION WITH CORONARY ANGIOGRAM;  Surgeon: [name], MD;  Location: [facility] CATH LAB;  Service: Cardiovascular;  Laterality: N/A;  . LEFT HEART CATHETERIZATION WITH CORONARY ANGIOGRAM N/A 05/07/2014   Procedure: LEFT HEART CATHETERIZATION WITH CORONARY ANGIOGRAM;  Surgeon: [name], MD;  Location: [facility] CATH LAB;  Service: Cardiovascular;  Laterality: N/A;  . PARTIAL HYSTERECTOMY  1980s  . TEE WITHOUT CARDIOVERSION N/A 09/24/2017   Procedure: TRANSESOPHAGEAL ECHOCARDIOGRAM (TEE);  Surgeon: Prescott Gum, Collier Salina, MD;  Location: [place];  Service: Open Heart Surgery;  Laterality: N/A;  . VIDEO ASSISTED THORACOSCOPY (VATS)/ LOBECTOMY Left 03/08/2018   Procedure: VIDEO ASSISTED THORACOSCOPY (VATS)/ LOBECTOMY;  Surgeon: Ivin Poot, MD;  Location: [facility] OR;  Service: Thoracic;  Laterality: Left;    Family History  Problem Relation Age of Onset  . Coronary artery disease Father 73  . Heart attack Father 45  . GI problems Mother        Perforated colon   . Coronary artery disease Paternal Aunt   . Coronary artery disease Paternal Uncle   . Colon cancer Neg Hx   . Gastric cancer Neg Hx   . Esophageal cancer Neg Hx     Social History Social History   Tobacco Use  . Smoking status: Former Smoker    Packs/day: 1.00    Years: 53.00    Pack years: 53.00    Types: Cigarettes    Last attempt to quit: 03/20/2007    Years since quitting: 11.0  . Smokeless tobacco: Never Used  Substance Use Topics  . Alcohol use: No  . Drug use: No    Current Outpatient Medications  Medication Sig Dispense Refill  . acetaminophen (TYLENOL) 325 MG tablet Take 2 tablets (650 mg total) by mouth every 6 (six) hours as needed for mild pain (or Fever >/= 101). (Patient taking differently: Take 325 mg by mouth  every 6 (six) hours as needed for mild pain (or Fever >/= 101). )    . ALPRAZolam (XANAX) 0.25 MG tablet Take 0.25 mg by mouth 2 (two) times daily as needed for anxiety.     Marland Kitchen aspirin 325 MG tablet Take 325 mg by mouth daily.    Marland Kitchen atorvastatin (LIPITOR) 20 MG tablet Take 1 tablet (20 mg total) by mouth at bedtime. 30 tablet 11  . Carboxymethylcellul-Glycerin (LUBRICATING EYE DROPS OP) Place 1 drop into both eyes daily as needed (dry eyes).    Marland Kitchen lisinopril (PRINIVIL,ZESTRIL) 5 MG tablet Take 1 tablet (5 mg total) by mouth daily. (Patient taking differently: Take 5 mg by mouth at bedtime. ) 30 tablet 11  . magnesium oxide (MAG-OX) 400 (241.3 Mg) MG tablet TAKE 1/2 (ONE-HALF) TABLET BY MOUTH ONCE DAILY (Patient taking differently: Take 200 mg by mouth daily. ) 45 tablet 3  . memantine (NAMENDA) 10 MG tablet Take 10 mg by mouth 2 (two) times daily.     . metoprolol tartrate (  LOPRESSOR) 25 MG tablet Take 0.5 tablets (12.5 mg total) by mouth 2 (two) times daily. 60 tablet 3  . nitroGLYCERIN (NITROSTAT) 0.4 MG SL tablet Place 1 tablet (0.4 mg total) under the tongue every 5 (five) minutes as needed for chest pain. 25 tablet 3  . pantoprazole (PROTONIX) 20 MG tablet Take 20 mg by mouth daily.    . potassium chloride 20 MEQ TBCR Take 20 mEq by mouth 4 (four) times daily for 1 day. 4 tablet 0  . traMADol (ULTRAM) 50 MG tablet Take 1-2 tablets (50-100 mg total) by mouth every 6 (six) hours as needed (mild pain). 30 tablet 0  . venlafaxine XR (EFFEXOR-XR) 37.5 MG 24 hr capsule Take 37.5 mg by mouth at bedtime.    Marland Kitchen zolpidem (AMBIEN) 10 MG tablet Take 10 mg by mouth at bedtime as needed for sleep.     No current facility-administered medications for this visit.    Facility-Administered Medications Ordered in Other Visits  Medication Dose Route Frequency Provider Last Rate Last Dose  . midazolam (VERSED) 5 MG/5ML injection    Anesthesia Intra-op Shirlyn Goltz, CRNA   2 mg at 02/07/18 0715    Allergies   Allergen Reactions  . Gadolinium Derivatives Hives and Itching    Pt began having hives and itching about 5-7 minutes after contrast administered. Dr Weber Cooks evaluated pt and had Katie RN administer 50 mg Benedryl by IV. Dr Weber Cooks cleared pt to go home.  . Adhesive [Tape] Other (See Comments)    Turns skin red  . Sulfa Antibiotics Other (See Comments)    Childhood allergy    Review of Systems Patient still recovering 2 major operations somewhat depressed and emotional  BP (!) 102/58   Pulse 68   Temp (!) 97.2 F (36.2 C) (Oral)   Resp 20   Ht 5' 1.5" (1.562 m)   Wt 106 lb (48.1 kg)   SpO2 95% Comment: RA  BMI 19.70 kg/m  Physical Exam Alert and comfortable Lungs clear Heart rate regular Left VATS scar well-healed Left chest tube site healing  Diagnostic Tests: Chest x-ray image personally reviewed, clear without pneumothorax after removal of chest tube Impression: Patient doing well 3 weeks postop left VATS and upper lobectomy for cancer.  Patient understands she can do normal daily activities including driving but should not do heavy exertional activities.  She will return for follow-up in approximate 1 month with chest x-ray and for further discussion of adjunctive chemotherapy for cancer diagnosis.  Plan:   Len Childs, MD [facility] [phone]

## 2018-04-20 ENCOUNTER — Ambulatory Visit: Payer: Medicare Other | Admitting: Physician Assistant

## 2018-05-11 ENCOUNTER — Encounter: Payer: Medicare Other | Admitting: Cardiothoracic Surgery

## 2018-05-31 ENCOUNTER — Other Ambulatory Visit: Payer: Self-pay

## 2018-06-01 ENCOUNTER — Encounter: Payer: Self-pay | Admitting: Cardiothoracic Surgery

## 2018-06-01 ENCOUNTER — Other Ambulatory Visit: Payer: Self-pay | Admitting: Cardiothoracic Surgery

## 2018-06-01 ENCOUNTER — Ambulatory Visit
Admission: RE | Admit: 2018-06-01 | Discharge: 2018-06-01 | Disposition: A | Discharge status: Home / Self Care | Payer: Medicare Other | Source: Ambulatory Visit | Attending: Cardiothoracic Surgery | Admitting: Cardiothoracic Surgery

## 2018-06-01 ENCOUNTER — Ambulatory Visit (INDEPENDENT_AMBULATORY_CARE_PROVIDER_SITE_OTHER): Payer: Self-pay | Admitting: Cardiothoracic Surgery

## 2018-06-01 VITALS — BP 140/76 | HR 80 | Temp 97.5°F | Resp 20 | Ht 61.5 in | Wt 107.0 lb

## 2018-06-01 DIAGNOSIS — R911 Solitary pulmonary nodule: Secondary | ICD-10-CM

## 2018-06-01 DIAGNOSIS — C3412 Malignant neoplasm of upper lobe, left bronchus or lung: Secondary | ICD-10-CM

## 2018-06-01 DIAGNOSIS — Z902 Acquired absence of lung [part of]: Secondary | ICD-10-CM

## 2018-06-01 NOTE — Progress Notes (Signed)
PCP is System, Provider Not In Referring Provider is Leonie Man, MD  Chief Complaint  Patient presents with  . Lung Cancer    1 month f/u with CXR, HX of lobectomy    HPI: The patient returns for scheduled follow-up 3 months after left upper lobectomy for stage I small cell carcinoma.  The patient is 78 years old and had undergone CABG x4 for left main and unstable angina 3 months prior to her thoracotomy.  She was recommended for adjunctive chemotherapy but has declined.  We again discussed the potential benefits versus downside of chemotherapy and she remains firm in her wish to continue her recovery, optimizing her quality of life at this time and to accept the potential risk of decreased survival without chemotherapy.  Overall she feels great.  Her appetite and weight are improved.  Her strength is improving.  She is not smoking.  She denies shortness of breath.  No symptoms of fever cough myalgia or viral prodrome.   Past Medical History:  Diagnosis Date  . AKI (acute kidney injury) ([place]) 05/16/2017  . Anxiety   . Barrett's esophagus without dysplasia   . CAD (coronary artery disease)    a. LHC 10/16/11: dLM 20%, mLAD 90%, pOM1 30%, mOM1 70% (FFR not hemodynamically significant), prox and mid RCA 30%, EF 65%;  b. PCI 10/16/11: Promus DES x 2 to mLAD  . Cancer ([place])    left upper lobe  . COPD (chronic obstructive pulmonary disease) ([place])   . Depression   . Diverticulosis   . Essential hypertension   . Family history of adverse reaction to anesthesia    mother had "breathing problems"  . GERD (gastroesophageal reflux disease)   . Hyperlipidemia   . Rectocele     Past Surgical History:  Procedure Laterality Date  . ABDOMINAL HYSTERECTOMY    . CARDIAC CATHETERIZATION    . CARDIAC CATHETERIZATION N/A 06/21/2015   Procedure: Right/Left Heart Cath and Coronary Angiography;  Surgeon: [name], MD;  Location: [place] [facility];  Service: Cardiovascular;  Laterality:  N/A;  . CARDIAC CATHETERIZATION  09/16/2017  . CATARACT EXTRACTION W/ INTRAOCULAR LENS  IMPLANT, BILATERAL Bilateral   . COLONOSCOPY  01/2008   Multiple diverticula in desc and sigm colon  . CORONARY ARTERY BYPASS GRAFT N/A 09/24/2017   Procedure: CORONARY ARTERY BYPASS GRAFTING (CABG) x4 using mammary artery and saphenous vein. LIMA to LAD, SVG to RCA, SVG to RAMUS, SVT to OM. Endoscopic saphenous vein harvest.;  Surgeon: Ivin Poot, MD;  Location: [place];  Service: Open Heart Surgery;  Laterality: N/A;  . ESOPHAGOGASTRODUODENOSCOPY  01/2008   Barrett's esophagus, gastritis, no h.pylori, due follow-up 01/2011  . ESOPHAGOGASTRODUODENOSCOPY  04/06/2011   Procedure: ESOPHAGOGASTRODUODENOSCOPY (EGD);  Surgeon: Danie Binder, MD;  [facility];  Service: Endoscopy;  Laterality: N/A;  8:30  . FLEXIBLE SIGMOIDOSCOPY  08/11/2010 RECTAL PAIN/PRESSURE   SML IH, TICS  . FRACTIONAL FLOW RESERVE WIRE N/A 10/16/2011   Procedure: FRACTIONAL FLOW RESERVE WIRE;  Surgeon: [name], MD;  Location: [facility] C[facility] LAB;  Service: Cardiovascular;  Laterality: N/A;  . IR PERC PLEURAL DRAIN W/INDWELL CATH W/IMG GUIDE  10/01/2017  . LEFT HEART CATH AND CORONARY ANGIOGRAPHY N/A 09/16/2017   Procedure: LEFT HEART CATH AND CORONARY ANGIOGRAPHY;  Surgeon: [name], MD;  Location: [place] [facility];  Service: Cardiovascular;  Laterality: N/A;  . LEFT HEART CATHETERIZATION WITH CORONARY ANGIOGRAM N/A 03/07/2012   Procedure: LEFT HEART CATHETERIZATION  WITH CORONARY ANGIOGRAM;  Surgeon: [name], MD;  Location: [facility] CATH LAB;  Service: Cardiovascular;  Laterality: N/A;  . LEFT HEART CATHETERIZATION WITH CORONARY ANGIOGRAM N/A 12/14/2012   Procedure: LEFT HEART CATHETERIZATION WITH CORONARY ANGIOGRAM;  Surgeon: [name], MD;  Location: [facility] C[facility] LAB;  Service: Cardiovascular;  Laterality: N/A;  . LEFT HEART CATHETERIZATION WITH CORONARY ANGIOGRAM N/A 05/07/2014   Procedure:  LEFT HEART CATHETERIZATION WITH CORONARY ANGIOGRAM;  Surgeon: [name], MD;  Location: [facility] CATH LAB;  Service: Cardiovascular;  Laterality: N/A;  . PARTIAL HYSTERECTOMY  1980s  . TEE WITHOUT CARDIOVERSION N/A 09/24/2017   Procedure: TRANSESOPHAGEAL ECHOCARDIOGRAM (TEE);  Surgeon: Prescott Gum, Collier Salina, MD;  Location: [place];  Service: Open Heart Surgery;  Laterality: N/A;  . VIDEO ASSISTED THORACOSCOPY (VATS)/ LOBECTOMY Left 03/08/2018   Procedure: VIDEO ASSISTED THORACOSCOPY (VATS)/ LOBECTOMY;  Surgeon: Ivin Poot, MD;  Location: [facility] OR;  Service: Thoracic;  Laterality: Left;    Family History  Problem Relation Age of Onset  . Coronary artery disease Father 83  . Heart attack Father 54  . GI problems Mother        Perforated colon   . Coronary artery disease Paternal Aunt   . Coronary artery disease Paternal Uncle   . Colon cancer Neg Hx   . Gastric cancer Neg Hx   . Esophageal cancer Neg Hx     Social History Social History   Tobacco Use  . Smoking status: Former Smoker    Packs/day: 1.00    Years: 53.00    Pack years: 53.00    Types: Cigarettes    Last attempt to quit: 03/20/2007    Years since quitting: 11.2  . Smokeless tobacco: Never Used  Substance Use Topics  . Alcohol use: No  . Drug use: No    Current Outpatient Medications  Medication Sig Dispense Refill  . acetaminophen (TYLENOL) 325 MG tablet Take 2 tablets (650 mg total) by mouth every 6 (six) hours as needed for mild pain (or Fever >/= 101). (Patient taking differently: Take 325 mg by mouth every 6 (six) hours as needed for mild pain (or Fever >/= 101). )    . ALPRAZolam (XANAX) 0.25 MG tablet Take 0.25 mg by mouth 2 (two) times daily as needed for anxiety.     Marland Kitchen aspirin 325 MG tablet Take 325 mg by mouth daily.    Marland Kitchen atorvastatin (LIPITOR) 20 MG tablet Take 1 tablet (20 mg total) by mouth at bedtime. 30 tablet 11  . Carboxymethylcellul-Glycerin (LUBRICATING EYE DROPS OP) Place 1 drop into both eyes  daily as needed (dry eyes).    Marland Kitchen lisinopril (PRINIVIL,ZESTRIL) 5 MG tablet Take 1 tablet (5 mg total) by mouth daily. (Patient taking differently: Take 5 mg by mouth at bedtime. ) 30 tablet 11  . magnesium oxide (MAG-OX) 400 (241.3 Mg) MG tablet TAKE 1/2 (ONE-HALF) TABLET BY MOUTH ONCE DAILY (Patient taking differently: Take 200 mg by mouth daily. ) 45 tablet 3  . memantine (NAMENDA) 10 MG tablet Take 10 mg by mouth 2 (two) times daily.     . metoprolol tartrate (LOPRESSOR) 25 MG tablet Take 0.5 tablets (12.5 mg total) by mouth 2 (two) times daily. 60 tablet 3  . nitroGLYCERIN (NITROSTAT) 0.4 MG SL tablet Place 1 tablet (0.4 mg total) under the tongue every 5 (five) minutes as needed for chest pain. 25 tablet 3  . pantoprazole (PROTONIX) 20 MG tablet Take 20 mg by mouth daily.    Marland Kitchen  traMADol (ULTRAM) 50 MG tablet Take 1-2 tablets (50-100 mg total) by mouth every 6 (six) hours as needed (mild pain). 30 tablet 0  . venlafaxine XR (EFFEXOR-XR) 37.5 MG 24 hr capsule Take 37.5 mg by mouth at bedtime.    Marland Kitchen zolpidem (AMBIEN) 10 MG tablet Take 10 mg by mouth at bedtime as needed for sleep.    . potassium chloride 20 MEQ TBCR Take 20 mEq by mouth 4 (four) times daily for 1 day. 4 tablet 0   No current facility-administered medications for this visit.    Facility-Administered Medications Ordered in Other Visits  Medication Dose Route Frequency Provider Last Rate Last Dose  . midazolam (VERSED) 5 MG/5ML injection    Anesthesia Intra-op Shirlyn Goltz, CRNA   2 mg at 02/07/18 0715    Allergies  Allergen Reactions  . Gadolinium Derivatives Hives and Itching    Pt began having hives and itching about 5-7 minutes after contrast administered. Dr Weber Cooks evaluated pt and had Katie RN administer 50 mg Benedryl by IV. Dr Weber Cooks cleared pt to go home.  . Adhesive [Tape] Other (See Comments)    Turns skin red  . Sulfa Antibiotics Other (See Comments)    Childhood allergy    Review of Systems  No  fever No hemoptysis No shortness of breath Surgical incisions healing well  BP 140/76   Pulse 80   Temp (!) 97.5 F (36.4 C) (Skin)   Resp 20   Ht 5' 1.5" (1.562 m)   Wt 107 lb (48.5 kg)   SpO2 98% Comment: RA  BMI 19.89 kg/m  Physical Exam      Exam    General- alert and comfortable    Neck- no JVD, no cervical adenopathy palpable, no carotid bruit   Lungs- clear without rales, wheezes.  Left VATS incision well-healed.   Cor- regular rate and rhythm, no murmur , gallop   Abdomen- soft, non-tender   Extremities - warm, non-tender, minimal edema   Neuro- oriented, appropriate, no focal weakness   Diagnostic Tests: Chest x-ray taken today, image personally reviewed.  Left lung remains clear and no evidence of other pulmonary nodules or evidence of recurrence.  No pleural effusions.  Impression: Patient making excellent recovery at age 24 after almost back to back CABG then left upper lobectomy for cancer. She has made a firm decision not to be evaluated for adjunctive chemotherapy but to enjoy her quality of life at present.  Plan: Patient return in 3 months for surveillance CT scan of chest, 6 months after left upper lobectomy.   Len Childs, MD [facility] [phone]

## 2018-07-12 ENCOUNTER — Other Ambulatory Visit: Payer: Self-pay | Admitting: *Deleted

## 2018-07-12 DIAGNOSIS — Z85118 Personal history of other malignant neoplasm of bronchus and lung: Secondary | ICD-10-CM

## 2018-08-31 ENCOUNTER — Ambulatory Visit
Admission: RE | Admit: 2018-08-31 | Discharge: 2018-08-31 | Disposition: A | Discharge status: Home / Self Care | Payer: Medicare Other | Source: Ambulatory Visit | Attending: Cardiothoracic Surgery | Admitting: Cardiothoracic Surgery

## 2018-08-31 ENCOUNTER — Encounter: Payer: Self-pay | Admitting: Cardiothoracic Surgery

## 2018-08-31 ENCOUNTER — Other Ambulatory Visit: Payer: Self-pay

## 2018-08-31 ENCOUNTER — Ambulatory Visit (INDEPENDENT_AMBULATORY_CARE_PROVIDER_SITE_OTHER): Payer: Medicare Other | Admitting: Cardiothoracic Surgery

## 2018-08-31 VITALS — BP 130/70 | HR 80 | Temp 97.3°F | Resp 16 | Ht 61.5 in | Wt 105.0 lb

## 2018-08-31 DIAGNOSIS — Z85118 Personal history of other malignant neoplasm of bronchus and lung: Secondary | ICD-10-CM

## 2018-08-31 DIAGNOSIS — I2510 Atherosclerotic heart disease of native coronary artery without angina pectoris: Secondary | ICD-10-CM | POA: Diagnosis not present

## 2018-08-31 DIAGNOSIS — Z902 Acquired absence of lung [part of]: Secondary | ICD-10-CM | POA: Diagnosis not present

## 2018-08-31 DIAGNOSIS — C3412 Malignant neoplasm of upper lobe, left bronchus or lung: Secondary | ICD-10-CM

## 2018-08-31 MED ORDER — IOPAMIDOL (ISOVUE-300) INJECTION 61%
75.0000 mL | Freq: Once | INTRAVENOUS | Status: AC | PRN
  Administered 2018-08-31: 75 mL via INTRAVENOUS

## 2018-08-31 NOTE — Progress Notes (Signed)
PCP is System, Provider Not In Referring Provider is Leonie Man, MD  Chief Complaint  Patient presents with  . 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  She did not elect to have postoperative chemotherapy.  She has stopped smoking.  She denies pulmonary symptoms.  I reviewed the images of her CT scan personally.  She has no evidence of recurrent or new malignancy in the lungs.  Baseline changes of multilobular emphysema.  No abnormal adenopathy.   Past Medical History:  Diagnosis Date  . AKI (acute kidney injury) ([place]) 05/16/2017  . Anxiety   . Barrett's esophagus without dysplasia   . CAD (coronary artery disease)    a. LHC 10/16/11: dLM 20%, mLAD 90%, pOM1 30%, mOM1 70% (FFR not hemodynamically significant), prox and mid RCA 30%, EF 65%;  b. PCI 10/16/11: Promus DES x 2 to mLAD  . Cancer ([place])    left upper lobe  . COPD (chronic obstructive pulmonary disease) ([place])   . Depression   . Diverticulosis   . Essential hypertension   . Family history of adverse reaction to anesthesia    mother had "breathing problems"  . GERD (gastroesophageal reflux disease)   . Hyperlipidemia   . Rectocele     Past Surgical History:  Procedure Laterality Date  . ABDOMINAL HYSTERECTOMY    . CARDIAC CATHETERIZATION    . CARDIAC CATHETERIZATION N/A 06/21/2015   Procedure: Right/Left Heart Cath and Coronary Angiography;  Surgeon: [name], MD;  Location: [place] [facility];  Service: Cardiovascular;  Laterality: N/A;  . CARDIAC CATHETERIZATION  09/16/2017  . CATARACT EXTRACTION W/ INTRAOCULAR LENS  IMPLANT, BILATERAL Bilateral   . COLONOSCOPY  01/2008   Multiple diverticula in desc and sigm colon  . CORONARY ARTERY BYPASS GRAFT N/A 09/24/2017   Procedure: CORONARY ARTERY BYPASS GRAFTING (CABG) x4 using mammary artery and saphenous vein. LIMA to LAD,  SVG to RCA, SVG to RAMUS, SVT to OM. Endoscopic saphenous vein harvest.;  Surgeon: Ivin Poot, MD;  Location: [place];  Service: Open Heart Surgery;  Laterality: N/A;  . ESOPHAGOGASTRODUODENOSCOPY  01/2008   Barrett's esophagus, gastritis, no h.pylori, due follow-up 01/2011  . ESOPHAGOGASTRODUODENOSCOPY  04/06/2011   Procedure: ESOPHAGOGASTRODUODENOSCOPY (EGD);  Surgeon: Danie Binder, MD;  [facility];  Service: Endoscopy;  Laterality: N/A;  8:30  . FLEXIBLE SIGMOIDOSCOPY  08/11/2010 RECTAL PAIN/PRESSURE   SML IH, TICS  . FRACTIONAL FLOW RESERVE WIRE N/A 10/16/2011   Procedure: FRACTIONAL FLOW RESERVE WIRE;  Surgeon: [name], MD;  Location: [facility] CATH LAB;  Service: Cardiovascular;  Laterality: N/A;  . IR PERC PLEURAL DRAIN W/INDWELL CATH W/IMG GUIDE  10/01/2017  . LEFT HEART CATH AND CORONARY ANGIOGRAPHY N/A 09/16/2017   Procedure: LEFT HEART CATH AND CORONARY ANGIOGRAPHY;  Surgeon: [name], MD;  Location: [place] [facility];  Service: Cardiovascular;  Laterality: N/A;  . LEFT HEART CATHETERIZATION WITH CORONARY ANGIOGRAM N/A 03/07/2012   Procedure: LEFT HEART CATHETERIZATION WITH CORONARY ANGIOGRAM;  Surgeon: [name], MD;  Location: [facility] C[facility] LAB;  Service: Cardiovascular;  Laterality: N/A;  . LEFT HEART CATHETERIZATION WITH CORONARY ANGIOGRAM N/A 12/14/2012   Procedure: LEFT HEART CATHETERIZATION WITH CORONARY ANGIOGRAM;  Surgeon: [name], MD;  Location: [facility];  Service: Cardiovascular;  Laterality: N/A;  . LEFT HEART CATHETERIZATION WITH  CORONARY ANGIOGRAM N/A 05/07/2014   Procedure: LEFT HEART CATHETERIZATION WITH CORONARY ANGIOGRAM;  Surgeon: [name], MD;  Location: [facility] CATH LAB;  Service: Cardiovascular;  Laterality: N/A;  . PARTIAL HYSTERECTOMY  1980s  . TEE WITHOUT CARDIOVERSION N/A 09/24/2017   Procedure: TRANSESOPHAGEAL ECHOCARDIOGRAM (TEE);  Surgeon: Prescott Gum, Collier Salina, MD;  Location: [place];  Service:  Open Heart Surgery;  Laterality: N/A;  . VIDEO ASSISTED THORACOSCOPY (VATS)/ LOBECTOMY Left 03/08/2018   Procedure: VIDEO ASSISTED THORACOSCOPY (VATS)/ LOBECTOMY;  Surgeon: Ivin Poot, MD;  Location: [facility] OR;  Service: Thoracic;  Laterality: Left;    Family History  Problem Relation Age of Onset  . Coronary artery disease Father 19  . Heart attack Father 55  . GI problems Mother        Perforated colon   . Coronary artery disease Paternal Aunt   . Coronary artery disease Paternal Uncle   . Colon cancer Neg Hx   . Gastric cancer Neg Hx   . Esophageal cancer Neg Hx     Social History Social History   Tobacco Use  . Smoking status: Former Smoker    Packs/day: 1.00    Years: 53.00    Pack years: 53.00    Types: Cigarettes    Quit date: 03/20/2007    Years since quitting: 11.4  . Smokeless tobacco: Never Used  Substance Use Topics  . Alcohol use: No  . Drug use: No    Current Outpatient Medications  Medication Sig Dispense Refill  . acetaminophen (TYLENOL) 325 MG tablet Take 2 tablets (650 mg total) by mouth every 6 (six) hours as needed for mild pain (or Fever >/= 101). (Patient taking differently: Take 325 mg by mouth every 6 (six) hours as needed for mild pain (or Fever >/= 101). )    . ALPRAZolam (XANAX) 0.25 MG tablet Take 0.25 mg by mouth 2 (two) times daily as needed for anxiety.     Marland Kitchen aspirin 325 MG tablet Take 325 mg by mouth daily.    Marland Kitchen atorvastatin (LIPITOR) 20 MG tablet Take 1 tablet (20 mg total) by mouth at bedtime. 30 tablet 11  . Carboxymethylcellul-Glycerin (LUBRICATING EYE DROPS OP) Place 1 drop into both eyes daily as needed (dry eyes).    Marland Kitchen lisinopril (PRINIVIL,ZESTRIL) 5 MG tablet Take 1 tablet (5 mg total) by mouth daily. (Patient taking differently: Take 5 mg by mouth at bedtime. ) 30 tablet 11  . magnesium oxide (MAG-OX) 400 (241.3 Mg) MG tablet TAKE 1/2 (ONE-HALF) TABLET BY MOUTH ONCE DAILY (Patient taking differently: Take 200 mg by mouth daily. ) 45  tablet 3  . memantine (NAMENDA) 10 MG tablet Take 10 mg by mouth 2 (two) times daily.     . metoprolol tartrate (LOPRESSOR) 25 MG tablet Take 0.5 tablets (12.5 mg total) by mouth 2 (two) times daily. 60 tablet 3  . nitroGLYCERIN (NITROSTAT) 0.4 MG SL tablet Place 1 tablet (0.4 mg total) under the tongue every 5 (five) minutes as needed for chest pain. 25 tablet 3  . pantoprazole (PROTONIX) 20 MG tablet Take 20 mg by mouth daily.    . traMADol (ULTRAM) 50 MG tablet Take 1-2 tablets (50-100 mg total) by mouth every 6 (six) hours as needed (mild pain). 30 tablet 0  . venlafaxine XR (EFFEXOR-XR) 37.5 MG 24 hr capsule Take 37.5 mg by mouth at bedtime.    Marland Kitchen zolpidem (AMBIEN) 10 MG tablet Take 10 mg by mouth at bedtime as needed for sleep.  No current facility-administered medications for this visit.    Facility-Administered Medications Ordered in Other Visits  Medication Dose Route Frequency Provider Last Rate Last Dose  . midazolam (VERSED) 5 MG/5ML injection    Anesthesia Intra-op Shirlyn Goltz, CRNA   2 mg at 02/07/18 0715    Allergies  Allergen Reactions  . Gadolinium Derivatives Hives and Itching    Pt began having hives and itching about 5-7 minutes after contrast administered. Dr Weber Cooks evaluated pt and had Katie RN administer 50 mg Benedryl by IV. Dr Weber Cooks cleared pt to go home.  . Adhesive [Tape] Other (See Comments)    Turns skin red  . Sulfa Antibiotics Other (See Comments)    Childhood allergy    Review of Systems   Slowly gaining weight intentionally No shortness of breath No chest pain No symptoms of COVID viral infection-fever productive cough headache diarrhea or lack of taste  BP (!) 148/73 (BP Location: Right Arm, Patient Position: Sitting, Cuff Size: Normal)   Pulse 80   Temp (!) 97.3 F (36.3 C)   Resp 16   Ht 5' 1.5" (1.562 m)   Wt 105 lb (47.6 kg)   SpO2 94% Comment: RA  BMI 19.52 kg/m  Physical Exam      Exam    General- alert and  comfortable    Neck- no JVD, no cervical adenopathy palpable, no carotid bruit   Lungs- clear without rales, wheezes   Cor- regular rate and rhythm, no murmur , gallop   Abdomen- soft, non-tender   Extremities - warm, non-tender, minimal edema   Neuro- oriented, appropriate, no focal weakness   Diagnostic Tests: Chest CT shows no evidence of recurrent cancer 6 months after left upper lobectomy  Impression: Doing well after left upper lobectomy for stage I small cell cancer of the lung.  Plan: Return in 6 months for surveillance scan 1 year postop.  Continue smoking cessation.   Len Childs, MD [facility] [phone]

## 2018-10-21 ENCOUNTER — Telehealth: Payer: Self-pay

## 2018-10-21 NOTE — Telephone Encounter (Signed)
YOUR CARDIOLOGY TEAM HAS ARRANGED FOR AN E-VISIT FOR YOUR APPOINTMENT - PLEASE REVIEW IMPORTANT INFORMATION BELOW SEVERAL DAYS PRIOR TO YOUR APPOINTMENT  Due to the recent COVID-19 pandemic, we are transitioning in-person office visits to tele-medicine visits in an effort to decrease unnecessary exposure to our patients, their families, and staff. These visits are billed to your insurance just like a normal visit is. We also encourage you to sign up for MyChart if you have not already done so. You will need a smartphone if possible. For patients that do not have this, we can still complete the visit using a regular telephone but do prefer a smartphone to enable video when possible. You may have a family member that lives with you that can help. If possible, we also ask that you have a blood pressure cuff and scale at home to measure your blood pressure, heart rate and weight prior to your scheduled appointment. Patients with clinical needs that need an in-person evaluation and testing will still be able to come to the office if absolutely necessary. If you have any questions, feel free to call our office.     YOUR PROVIDER WILL BE USING THE FOLLOWING PLATFORM TO COMPLETE YOUR VISIT:  Patient consent to have virtual visit with Cecilie Kicks NP on Monday.     THE DAY OF YOUR APPOINTMENT  Approximately 15 minutes prior to your scheduled appointment, you will receive a telephone call from one of [facility] team - your caller ID may say "Unknown caller."  Our staff will confirm medications, vital signs for the day and any symptoms you may be experiencing. Please have this information available prior to the time of visit start. It may also be helpful for you to have a pad of paper and pen handy for any instructions given during your visit. They will also walk you through joining the smartphone meeting if this is a video visit.    CONSENT FOR TELE-HEALTH VISIT - PLEASE REVIEW  I hereby voluntarily  request, consent and authorize [facility] and its employed or contracted physicians, physician assistants, nurse practitioners or other licensed health care professionals (the Practitioner), to provide me with telemedicine health care services (the "Services") as deemed necessary by the treating Practitioner. I acknowledge and consent to receive the Services by the Practitioner via telemedicine. I understand that the telemedicine visit will involve communicating with the Practitioner through live audiovisual communication technology and the disclosure of certain medical information by electronic transmission. I acknowledge that I have been given the opportunity to request an in-person assessment or other available alternative prior to the telemedicine visit and am voluntarily participating in the telemedicine visit.  I understand that I have the right to withhold or withdraw my consent to the use of telemedicine in the course of my care at any time, without affecting my right to future care or treatment, and that the Practitioner or I may terminate the telemedicine visit at any time. I understand that I have the right to inspect all information obtained and/or recorded in the course of the telemedicine visit and may receive copies of available information for a reasonable fee.  I understand that some of the potential risks of receiving the Services via telemedicine include:  Marland Kitchen Delay or interruption in medical evaluation due to technological equipment failure or disruption; . Information transmitted may not be sufficient (e.g. poor resolution of images) to allow for appropriate medical decision making by the Practitioner; and/or  . In rare instances, security protocols could  fail, causing a breach of personal health information.  Furthermore, I acknowledge that it is my responsibility to provide information about my medical history, conditions and care that is complete and accurate to the best of my  ability. I acknowledge that Practitioner's advice, recommendations, and/or decision may be based on factors not within their control, such as incomplete or inaccurate data provided by me or distortions of diagnostic images or specimens that may result from electronic transmissions. I understand that the practice of medicine is not an exact science and that Practitioner makes no warranties or guarantees regarding treatment outcomes. I acknowledge that I will receive a copy of this consent concurrently upon execution via email to the email address I last provided but may also request a printed copy by calling the office of [place].    I understand that my insurance will be billed for this visit.   I have read or had this consent read to me. . I understand the contents of this consent, which adequately explains the benefits and risks of the Services being provided via telemedicine.  . I have been provided ample opportunity to ask questions regarding this consent and the Services and have had my questions answered to my satisfaction. . I give my informed consent for the services to be provided through the use of telemedicine in my medical care  By participating in this telemedicine visit I agree to the above.

## 2018-10-23 NOTE — Progress Notes (Signed)
Virtual Visit via Telephone Note   This visit type was conducted due to national recommendations for restrictions regarding the COVID-19 Pandemic (e.g. social distancing) in an effort to limit this patient's exposure and mitigate transmission in our community.  Due to her co-morbid illnesses, this patient is at least at moderate risk for complications without adequate follow up.  This format is felt to be most appropriate for this patient at this time.  The patient did not have access to video technology/had technical difficulties with video requiring transitioning to audio format only (telephone).  All issues noted in this document were discussed and addressed.  No physical exam could be performed with this format.  Please refer to the patient's chart for her  consent to telehealth for [facility].   Date:  10/24/2018   ID:  ITA FRITZSCHE, DOB Dec 15, 1940, MRN [id number]  Patient Location: Home Provider Location: Office  PCP:  Etter Sjogren, FNP  Cardiologist:  Lauree Chandler, MD  Electrophysiologist:  None   Evaluation Performed:  Follow-Up Visit  Chief Complaint:  CAD with hx of CABG  History of Present Illness:    Rhonda Hughes is a 78 y.o. female with coronary stenting in the past and most recently underwent coronary artery bypass surgery in September 2019 by Dr. Prescott Gum.  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  She did not elect to have postoperative chemotherapy.  She has stopped smoking.  She denies pulmonary symptoms. Last CT of chest with no evidence of of recurrent or new malignancy per Dr. Prescott Gum.    Today pt is doing great.  No chest pain or SOB. Unable to take BP without a cuff but was 130/70 on last visit with Dr. Darcey Nora in Sept. 2020.  She is eating healthy, no longer smokes.  And is walking for exercise, she also does her own housekeeping.  She has stopped ASA was  taking 325 mg but refuses that dose but agreeable to 81 mg daily.      The patient does not have symptoms concerning for COVID-19 infection (fever, chills, cough, or new shortness of breath).    Past Medical History:  Diagnosis Date  . AKI (acute kidney injury) ([place]) 05/16/2017  . Anxiety   . Barrett's esophagus without dysplasia   . CAD (coronary artery disease)    a. LHC 10/16/11: dLM 20%, mLAD 90%, pOM1 30%, mOM1 70% (FFR not hemodynamically significant), prox and mid RCA 30%, EF 65%;  b. PCI 10/16/11: Promus DES x 2 to mLAD  . Cancer ([place])    left upper lobe  . COPD (chronic obstructive pulmonary disease) ([place])   . Depression   . Diverticulosis   . Essential hypertension   . Family history of adverse reaction to anesthesia    mother had "breathing problems"  . GERD (gastroesophageal reflux disease)   . Hyperlipidemia   . Rectocele    Past Surgical History:  Procedure Laterality Date  . ABDOMINAL HYSTERECTOMY    . CARDIAC CATHETERIZATION    . CARDIAC CATHETERIZATION N/A 06/21/2015   Procedure: Right/Left Heart Cath and Coronary Angiography;  Surgeon: [name], MD;  Location: [place] CV LAB;  Service: Cardiovascular;  Laterality: N/A;  . CARDIAC CATHETERIZATION  09/16/2017  . CATARACT EXTRACTION W/ INTRAOCULAR LENS  IMPLANT, BILATERAL Bilateral   . COLONOSCOPY  01/2008   Multiple diverticula in desc and sigm colon  . CORONARY  ARTERY BYPASS GRAFT N/A 09/24/2017   Procedure: CORONARY ARTERY BYPASS GRAFTING (CABG) x4 using mammary artery and saphenous vein. LIMA to LAD, SVG to RCA, SVG to RAMUS, SVT to OM. Endoscopic saphenous vein harvest.;  Surgeon: Ivin Poot, MD;  Location: [place];  Service: Open Heart Surgery;  Laterality: N/A;  . ESOPHAGOGASTRODUODENOSCOPY  01/2008   Barrett's esophagus, gastritis, no h.pylori, due follow-up 01/2011  . ESOPHAGOGASTRODUODENOSCOPY  04/06/2011   Procedure: ESOPHAGOGASTRODUODENOSCOPY (EGD);  Surgeon: Danie Binder, MD;  [facility];  Service: Endoscopy;  Laterality: N/A;  8:30  . FLEXIBLE SIGMOIDOSCOPY  08/11/2010 RECTAL PAIN/PRESSURE   SML IH, TICS  . FRACTIONAL FLOW RESERVE WIRE N/A 10/16/2011   Procedure: FRACTIONAL FLOW RESERVE WIRE;  Surgeon: [name], MD;  Location: [facility] CATH LAB;  Service: Cardiovascular;  Laterality: N/A;  . IR PERC PLEURAL DRAIN W/INDWELL CATH W/IMG GUIDE  10/01/2017  . LEFT HEART CATH AND CORONARY ANGIOGRAPHY N/A 09/16/2017   Procedure: LEFT HEART CATH AND CORONARY ANGIOGRAPHY;  Surgeon: [name], MD;  Location: [place] CV LAB;  Service: Cardiovascular;  Laterality: N/A;  . LEFT HEART CATHETERIZATION WITH CORONARY ANGIOGRAM N/A 03/07/2012   Procedure: LEFT HEART CATHETERIZATION WITH CORONARY ANGIOGRAM;  Surgeon: [name], MD;  Location: [facility] CATH LAB;  Service: Cardiovascular;  Laterality: N/A;  . LEFT HEART CATHETERIZATION WITH CORONARY ANGIOGRAM N/A 12/14/2012   Procedure: LEFT HEART CATHETERIZATION WITH CORONARY ANGIOGRAM;  Surgeon: [name], MD;  Location: [facility] CATH LAB;  Service: Cardiovascular;  Laterality: N/A;  . LEFT HEART CATHETERIZATION WITH CORONARY ANGIOGRAM N/A 05/07/2014   Procedure: LEFT HEART CATHETERIZATION WITH CORONARY ANGIOGRAM;  Surgeon: [name], MD;  Location: [facility] CATH LAB;  Service: Cardiovascular;  Laterality: N/A;  . PARTIAL HYSTERECTOMY  1980s  . TEE WITHOUT CARDIOVERSION N/A 09/24/2017   Procedure: TRANSESOPHAGEAL ECHOCARDIOGRAM (TEE);  Surgeon: Prescott Gum, Collier Salina, MD;  Location: [place];  Service: Open Heart Surgery;  Laterality: N/A;  . VIDEO ASSISTED THORACOSCOPY (VATS)/ LOBECTOMY Left 03/08/2018   Procedure: VIDEO ASSISTED THORACOSCOPY (VATS)/ LOBECTOMY;  Surgeon: Prescott Gum, Collier Salina, MD;  Location: [place];  Service: Thoracic;  Laterality: Left;     Current Meds  Medication Sig  . acetaminophen (TYLENOL) 325 MG tablet Take 2 tablets (650 mg total) by mouth every 6 (six) hours as needed for mild  pain (or Fever >/= 101). (Patient taking differently: Take 325 mg by mouth every 6 (six) hours as needed for mild pain (or Fever >/= 101). )  . ALPRAZolam (XANAX) 0.25 MG tablet Take 0.25 mg by mouth 2 (two) times daily as needed for anxiety.   Marland Kitchen atorvastatin (LIPITOR) 10 MG tablet Take 1 tablet by mouth daily.  . Carboxymethylcellul-Glycerin (LUBRICATING EYE DROPS OP) Place 1 drop into both eyes daily as needed (dry eyes).  Marland Kitchen lisinopril (PRINIVIL,ZESTRIL) 5 MG tablet Take 1 tablet (5 mg total) by mouth daily. (Patient taking differently: Take 5 mg by mouth at bedtime. )  . magnesium oxide (MAG-OX) 400 (241.3 Mg) MG tablet TAKE 1/2 (ONE-HALF) TABLET BY MOUTH ONCE DAILY (Patient taking differently: Take 200 mg by mouth daily. )  . memantine (NAMENDA) 10 MG tablet Take 10 mg by mouth 2 (two) times daily.  . metoprolol tartrate (LOPRESSOR) 25 MG tablet Take 0.5 tablets (12.5 mg total) by mouth 2 (two) times daily.  . nitroGLYCERIN (NITROSTAT) 0.4 MG SL tablet Place 1 tablet (0.4 mg total) under the tongue every 5 (five) minutes as needed for chest pain.  Marland Kitchen  pantoprazole (PROTONIX) 20 MG tablet Take 20 mg by mouth daily.  Marland Kitchen venlafaxine XR (EFFEXOR-XR) 37.5 MG 24 hr capsule Take 37.5 mg by mouth at bedtime.  Marland Kitchen zolpidem (AMBIEN) 10 MG tablet Take 10 mg by mouth at bedtime as needed for sleep.  . [DISCONTINUED] atorvastatin (LIPITOR) 20 MG tablet Take 1 tablet (20 mg total) by mouth at bedtime.     Allergies:   Gadolinium derivatives, Adhesive [tape], and Sulfa antibiotics   Social History   Tobacco Use  . Smoking status: Former Smoker    Packs/day: 1.00    Years: 53.00    Pack years: 53.00    Types: Cigarettes    Quit date: 03/20/2007    Years since quitting: 11.6  . Smokeless tobacco: Never Used  Substance Use Topics  . Alcohol use: No  . Drug use: No     Family Hx: The patient's family history includes Coronary artery disease in her paternal aunt and paternal uncle; Coronary artery disease  (age of onset: 60) in her father; GI problems in her mother; Heart attack (age of onset: 40) in her father. There is no history of Colon cancer, Gastric cancer, or Esophageal cancer.  ROS:   Please see the history of present illness.    General:no colds or fevers, no weight changes Skin:no rashes or ulcers HEENT:no blurred vision, no congestion CV:see HPI PUL:see HPI GI:no diarrhea constipation or melena, no indigestion GU:no hematuria, no dysuria MS:no joint pain, no claudication Neuro:no syncope, no lightheadedness Endo:no diabetes, no thyroid disease  All other systems reviewed and are negative.   Prior CV studies:   The following studies were reviewed today:  TEE 09/24/17 Result status: Final result   Aortic valve: The valve is trileaflet. Mild valve thickening present. Mild valve calcification present. No stenosis. No regurgitation.  Mitral valve: Non-specific thickening. Mild leaflet thickening is present.  Right ventricle: Normal cavity size and ejection fraction.     Echo 09/16/17 Study Conclusions  - Left ventricle: The cavity size was normal. Systolic function was   vigorous. The estimated ejection fraction was in the range of 65%   to 70%. Wall motion was normal; there were no regional wall   motion abnormalities. Doppler parameters are consistent with   abnormal left ventricular relaxation (grade 1 diastolic   dysfunction). There was no evidence of elevated ventricular   filling pressure by Doppler parameters. - Aortic valve: There was no regurgitation. - Aortic root: The aortic root was normal in size. - Mitral valve: Calcified annulus. Mildly thickened leaflets .   There was trivial regurgitation. - Right ventricle: The cavity size was normal. Wall thickness was   normal. Systolic function was normal. - Tricuspid valve: There was mild regurgitation. - Pulmonary arteries: Systolic pressure was within the normal   range. - Inferior vena cava: The vessel was  normal in size. - Pericardium, extracardiac: There was no pericardial effusion.  Cardiac cath 09/16/17  LM lesion is 30% stenosed.  Prox LAD lesion is 30% stenosed.  Mid LAD to Dist LAD lesion is 10% stenosed.  Ost LAD lesion is 90% stenosed.  Ost Ramus lesion is 50% stenosed.  Ost Cx to Prox Cx lesion is 80% stenosed.  Prox Cx to Mid Cx lesion is 20% stenosed.  Ost RCA lesion is 30% stenosed.  Mid RCA-1 lesion is 20% stenosed.  Mid RCA-2 lesion is 65% stenosed.  Mid LM to Ost LAD lesion is 80% stenosed.  Previously placed 2nd Mrg stent (unknown type) is  widely patent.  Previously placed Prox LAD to Mid LAD stent (unknown type) is widely patent.  Ost 1st Mrg lesion is 80% stenosed.  The left ventricular systolic function is normal.  LV end diastolic pressure is normal.  The left ventricular ejection fraction is 55-65% by visual estimate.  There is no mitral valve regurgitation.   1. Severe distal left main artery stenosis with hazy appearance involving the ostia of the LAD, intermediate and Circumflex arteries.  2. Severe ostial LAD stenosis. (Best seen in the LAO 43/CAU 18 view). Patent mid LAD stents.  3. Severe ostial intermediate branch stenosis 4. Severe ostial Circumflex stenosis (best seen in the LAO 49/CAU 8 view). Patent obtuse marginal stent 5. Moderately severe stenosis in the mid RCA 6. Normal LV systolic function  Recommendations: Her distal left main is hazy with severe stenosis affecting the LAD, Circumflex and large intermediate branch. She also has a moderately severe RCA stenosis. Her anatomy is not favorable for PCI/stenting. I will admit her to telemetry given her recent unstable symptoms. Will consult CT surgery to discuss CABG. She is a good candidate for CABG with normal renal function and normal LV systolic function. She has been on Plavix so this will be held today to allow for washout prior to CABG.   Labs/Other Tests and Data Reviewed:     EKG:  An ECG dated 09/08/18 was personally reviewed today and demonstrated:  SR with T wave inversion V 5-6 I, III, and LVH.  Recent Labs: 03/10/2018: ALT 16 03/15/2018: Hemoglobin 9.7; Platelets 261 03/19/2018: BUN 7; Creatinine, Ser 0.65; Potassium 4.4; Sodium 131   Recent Lipid Panel No results found for: CHOL, TRIG, HDL, CHOLHDL, LDLCALC, LDLDIRECT  Wt Readings from Last 3 Encounters:  08/31/18 105 lb (47.6 kg)  06/01/18 107 lb (48.5 kg)  04/13/18 106 lb (48.1 kg)     Objective:    Vital Signs:  There were no vitals taken for this visit. pt did not have a BP cuff.  VITAL SIGNS:  reviewed BP with dr. Darcey Nora was 130-70 and P 80 none today General NAD Neuro A&O X 3  Lungs no SOB with talking.  ASSESSMENT & PLAN:    1. CAD with CABG X 4 LIMA to LAD, VG to RCA, VG to Ramus and SVG to OM.  09/24/17. She had stopped ASA at 325 she is agreeable to take 81 mg daily. On  BB, ACE,  No angina, exercise 2. Lung cancer with Lt upper lobectomy for stage I small cell carcinoma.  No chemo per pt. CT of chest without recurrence.  3. Tobacco use - pt has stopped 4. HLD per PCP goal LDL < 70 continue statin 5. HTN stable , though not taken for this visit. Continue meds  COVID-19 Education: The signs and symptoms of COVID-19 were discussed with the patient and how to seek care for testing (follow up with PCP or arrange E-visit).  The importance of social distancing was discussed today.  Time:   Today, I have spent 12 minutes with the patient with telehealth technology discussing the above problems.     Medication Adjustments/Labs and Tests Ordered: Current medicines are reviewed at length with the patient today.  Concerns regarding medicines are outlined above.   Tests Ordered: No orders of the defined types were placed in this encounter.   Medication Changes: Meds ordered this encounter  Medications  . aspirin EC 81 MG tablet    Sig: Take 1 tablet (81 mg total) by mouth  daily.     Dispense:  90 tablet    Refill:  3    Follow Up:  In person visit with Dr. Angelena Form in 6 months. Signed, Cecilie Kicks, NP  10/24/2018 12:18 PM    [facility]

## 2018-10-24 ENCOUNTER — Ambulatory Visit: Payer: Medicare Other | Admitting: Cardiovascular Disease

## 2018-10-24 ENCOUNTER — Other Ambulatory Visit: Payer: Self-pay

## 2018-10-24 ENCOUNTER — Telehealth (INDEPENDENT_AMBULATORY_CARE_PROVIDER_SITE_OTHER): Payer: Medicare Other | Admitting: Cardiology

## 2018-10-24 ENCOUNTER — Encounter: Payer: Self-pay | Admitting: Cardiology

## 2018-10-24 ENCOUNTER — Telehealth: Payer: Self-pay

## 2018-10-24 DIAGNOSIS — I2510 Atherosclerotic heart disease of native coronary artery without angina pectoris: Secondary | ICD-10-CM

## 2018-10-24 DIAGNOSIS — E7800 Pure hypercholesterolemia, unspecified: Secondary | ICD-10-CM

## 2018-10-24 DIAGNOSIS — C3492 Malignant neoplasm of unspecified part of left bronchus or lung: Secondary | ICD-10-CM

## 2018-10-24 DIAGNOSIS — Z951 Presence of aortocoronary bypass graft: Secondary | ICD-10-CM

## 2018-10-24 DIAGNOSIS — I10 Essential (primary) hypertension: Secondary | ICD-10-CM

## 2018-10-24 MED ORDER — ASPIRIN EC 81 MG PO TBEC: 81.0000 mg | DELAYED_RELEASE_TABLET | Freq: Every day | ORAL | 3 refills | Status: DC

## 2018-10-24 NOTE — Telephone Encounter (Signed)

## 2018-10-24 NOTE — Patient Instructions (Signed)
Medication Instructions:  Your physician has recommended you make the following change in your medication:  1.  STOP the Aspirin 325 mg  2.  START Aspirin 81 mg daily  If you need a refill on your cardiac medications before your next appointment, please call your pharmacy.   Lab work: None ordered  If you have labs (blood work) drawn today and your tests are completely normal, you will receive your results only by: Marland Kitchen MyChart Message (if you have MyChart) OR . A paper copy in the mail If you have any lab test that is abnormal or we need to change your treatment, we will call you to review the results.  Testing/Procedures: None ordered  Follow-Up: At [facility], you and your health needs are our priority.  As part of our continuing mission to provide you with exceptional heart care, we have created designated Provider Care Teams.  These Care Teams include your primary Cardiologist (physician) and Advanced Practice Providers (APPs -  Physician Assistants and Nurse Practitioners) who all work together to provide you with the care you need, when you need it. You will need a follow up appointment in 6 months.  Please call our office 2 months in advance to schedule this appointment.  You may see Lauree Chandler, MD or one of the following Advanced Practice Providers on your designated Care Team:   [place], PA-C Melina Copa, PA-C . Ermalinda Barrios, PA-C  Any Other Special Instructions Will Be Listed Below (If Applicable).

## 2018-12-28 ENCOUNTER — Encounter: Payer: Self-pay | Admitting: *Deleted

## 2019-02-01 ENCOUNTER — Other Ambulatory Visit: Payer: Self-pay | Admitting: Cardiothoracic Surgery

## 2019-02-01 DIAGNOSIS — R911 Solitary pulmonary nodule: Secondary | ICD-10-CM

## 2019-03-08 ENCOUNTER — Other Ambulatory Visit: Payer: Self-pay

## 2019-03-08 ENCOUNTER — Ambulatory Visit (INDEPENDENT_AMBULATORY_CARE_PROVIDER_SITE_OTHER): Payer: Medicare Other | Admitting: Cardiothoracic Surgery

## 2019-03-08 ENCOUNTER — Ambulatory Visit
Admission: RE | Admit: 2019-03-08 | Discharge: 2019-03-08 | Disposition: A | Discharge status: Home / Self Care | Payer: Medicare Other | Source: Ambulatory Visit | Attending: Cardiothoracic Surgery | Admitting: Cardiothoracic Surgery

## 2019-03-08 ENCOUNTER — Encounter: Payer: Self-pay | Admitting: Cardiothoracic Surgery

## 2019-03-08 VITALS — BP 134/81 | HR 62 | Temp 97.7°F | Resp 16 | Ht 61.5 in | Wt 125.0 lb

## 2019-03-08 DIAGNOSIS — Z85118 Personal history of other malignant neoplasm of bronchus and lung: Secondary | ICD-10-CM | POA: Diagnosis not present

## 2019-03-08 DIAGNOSIS — Z902 Acquired absence of lung [part of]: Secondary | ICD-10-CM | POA: Diagnosis not present

## 2019-03-08 DIAGNOSIS — C3412 Malignant neoplasm of upper lobe, left bronchus or lung: Secondary | ICD-10-CM

## 2019-03-08 DIAGNOSIS — R911 Solitary pulmonary nodule: Secondary | ICD-10-CM

## 2019-03-08 MED ORDER — IOPAMIDOL (ISOVUE-300) INJECTION 61%
75.0000 mL | Freq: Once | INTRAVENOUS | Status: AC | PRN
  Administered 2019-03-08: 75 mL via INTRAVENOUS

## 2019-03-08 NOTE — Progress Notes (Signed)
PCP is Etter Sjogren, FNP Referring Provider is Leonie Man, MD  Chief Complaint  Patient presents with  . Lung Cancer    6 month f/u with Chest CT, s/p LULobectomy 03/08/18    HPI: Patient presents with CT scan of the chest 1 year after left upper lobectomy for small cell carcinoma, 2 cm diameter and 4/ - nodes.  She feels well. CT images today show no evidence of recurrent disease.  Patient does have COPD.  Patient had multivessel CABG a few months prior to her left upper lobectomy.  She remains stable following the procedure with sinus rhythm and no symptoms of angina or CHF.  Past Medical History:  Diagnosis Date  . AKI (acute kidney injury) ([place]) 05/16/2017  . Anxiety   . Barrett's esophagus without dysplasia   . CAD (coronary artery disease)    a. LHC 10/16/11: dLM 20%, mLAD 90%, pOM1 30%, mOM1 70% (FFR not hemodynamically significant), prox and mid RCA 30%, EF 65%;  b. PCI 10/16/11: Promus DES x 2 to mLAD  . Cancer ([place])    left upper lobe  . COPD (chronic obstructive pulmonary disease) ([place])   . Depression   . Diverticulosis   . Essential hypertension   . Family history of adverse reaction to anesthesia    mother had "breathing problems"  . GERD (gastroesophageal reflux disease)   . Hyperlipidemia   . Rectocele     Past Surgical History:  Procedure Laterality Date  . ABDOMINAL HYSTERECTOMY    . CARDIAC CATHETERIZATION    . CARDIAC CATHETERIZATION N/A 06/21/2015   Procedure: Right/Left Heart Cath and Coronary Angiography;  Surgeon: [name], MD;  Location: [place] CV LAB;  Service: Cardiovascular;  Laterality: N/A;  . CARDIAC CATHETERIZATION  09/16/2017  . CATARACT EXTRACTION W/ INTRAOCULAR LENS  IMPLANT, BILATERAL Bilateral   . COLONOSCOPY  01/2008   Multiple diverticula in desc and sigm colon  . CORONARY ARTERY BYPASS GRAFT N/A 09/24/2017   Procedure: CORONARY ARTERY BYPASS GRAFTING (CABG) x4 using mammary artery and saphenous vein. LIMA to LAD,  SVG to RCA, SVG to RAMUS, SVT to OM. Endoscopic saphenous vein harvest.;  Surgeon: Ivin Poot, MD;  Location: [place];  Service: Open Heart Surgery;  Laterality: N/A;  . ESOPHAGOGASTRODUODENOSCOPY  01/2008   Barrett's esophagus, gastritis, no h.pylori, due follow-up 01/2011  . ESOPHAGOGASTRODUODENOSCOPY  04/06/2011   Procedure: ESOPHAGOGASTRODUODENOSCOPY (EGD);  Surgeon: Danie Binder, MD;  [facility];  Service: Endoscopy;  Laterality: N/A;  8:30  . FLEXIBLE SIGMOIDOSCOPY  08/11/2010 RECTAL PAIN/PRESSURE   SML IH, TICS  . FRACTIONAL FLOW RESERVE WIRE N/A 10/16/2011   Procedure: FRACTIONAL FLOW RESERVE WIRE;  Surgeon: [name], MD;  Location: [facility] CATH LAB;  Service: Cardiovascular;  Laterality: N/A;  . IR PERC PLEURAL DRAIN W/INDWELL CATH W/IMG GUIDE  10/01/2017  . LEFT HEART CATH AND CORONARY ANGIOGRAPHY N/A 09/16/2017   Procedure: LEFT HEART CATH AND CORONARY ANGIOGRAPHY;  Surgeon: [name], MD;  Location: [place] CV LAB;  Service: Cardiovascular;  Laterality: N/A;  . LEFT HEART CATHETERIZATION WITH CORONARY ANGIOGRAM N/A 03/07/2012   Procedure: LEFT HEART CATHETERIZATION WITH CORONARY ANGIOGRAM;  Surgeon: [name], MD;  Location: [facility] CATH LAB;  Service: Cardiovascular;  Laterality: N/A;  . LEFT HEART CATHETERIZATION WITH CORONARY ANGIOGRAM N/A 12/14/2012   Procedure: LEFT HEART CATHETERIZATION WITH CORONARY ANGIOGRAM;  Surgeon: [name], MD;  Location: [facility] CATH LAB;  Service: Cardiovascular;  Laterality: N/A;  .  LEFT HEART CATHETERIZATION WITH CORONARY ANGIOGRAM N/A 05/07/2014   Procedure: LEFT HEART CATHETERIZATION WITH CORONARY ANGIOGRAM;  Surgeon: [name], MD;  Location: [facility] CATH LAB;  Service: Cardiovascular;  Laterality: N/A;  . PARTIAL HYSTERECTOMY  1980s  . TEE WITHOUT CARDIOVERSION N/A 09/24/2017   Procedure: TRANSESOPHAGEAL ECHOCARDIOGRAM (TEE);  Surgeon: Prescott Gum, Collier Salina, MD;  Location: [place];  Service:  Open Heart Surgery;  Laterality: N/A;  . VIDEO ASSISTED THORACOSCOPY (VATS)/ LOBECTOMY Left 03/08/2018   Procedure: VIDEO ASSISTED THORACOSCOPY (VATS)/ LOBECTOMY;  Surgeon: Ivin Poot, MD;  Location: [facility] OR;  Service: Thoracic;  Laterality: Left;    Family History  Problem Relation Age of Onset  . Coronary artery disease Father 9  . Heart attack Father 55  . GI problems Mother        Perforated colon   . Coronary artery disease Paternal Aunt   . Coronary artery disease Paternal Uncle   . Colon cancer Neg Hx   . Gastric cancer Neg Hx   . Esophageal cancer Neg Hx     Social History Social History   Tobacco Use  . Smoking status: Former Smoker    Packs/day: 1.00    Years: 53.00    Pack years: 53.00    Types: Cigarettes    Quit date: 03/20/2007    Years since quitting: 11.9  . Smokeless tobacco: Never Used  Substance Use Topics  . Alcohol use: No  . Drug use: No    Current Outpatient Medications  Medication Sig Dispense Refill  . acetaminophen (TYLENOL) 325 MG tablet Take 2 tablets (650 mg total) by mouth every 6 (six) hours as needed for mild pain (or Fever >/= 101). (Patient taking differently: Take 325 mg by mouth every 6 (six) hours as needed for mild pain (or Fever >/= 101). )    . ALPRAZolam (XANAX) 0.25 MG tablet Take 0.25 mg by mouth 2 (two) times daily as needed for anxiety.     Marland Kitchen aspirin EC 81 MG tablet Take 1 tablet (81 mg total) by mouth daily. 90 tablet 3  . atorvastatin (LIPITOR) 10 MG tablet Take 1 tablet by mouth daily.    . Carboxymethylcellul-Glycerin (LUBRICATING EYE DROPS OP) Place 1 drop into both eyes daily as needed (dry eyes).    Marland Kitchen lisinopril (PRINIVIL,ZESTRIL) 5 MG tablet Take 1 tablet (5 mg total) by mouth daily. (Patient taking differently: Take 5 mg by mouth at bedtime. ) 30 tablet 11  . magnesium oxide (MAG-OX) 400 (241.3 Mg) MG tablet TAKE 1/2 (ONE-HALF) TABLET BY MOUTH ONCE DAILY (Patient taking differently: Take 200 mg by mouth daily. ) 45  tablet 3  . memantine (NAMENDA) 10 MG tablet Take 10 mg by mouth 2 (two) times daily.    . metoprolol tartrate (LOPRESSOR) 25 MG tablet Take 0.5 tablets (12.5 mg total) by mouth 2 (two) times daily. 60 tablet 3  . nitroGLYCERIN (NITROSTAT) 0.4 MG SL tablet Place 1 tablet (0.4 mg total) under the tongue every 5 (five) minutes as needed for chest pain. 25 tablet 3  . pantoprazole (PROTONIX) 20 MG tablet Take 20 mg by mouth daily.    Marland Kitchen venlafaxine XR (EFFEXOR-XR) 37.5 MG 24 hr capsule Take 37.5 mg by mouth at bedtime.    Marland Kitchen zolpidem (AMBIEN) 10 MG tablet Take 10 mg by mouth at bedtime as needed for sleep.     No current facility-administered medications for this visit.   Facility-Administered Medications Ordered in Other Visits  Medication Dose Route Frequency  Provider Last Rate Last Admin  . midazolam (VERSED) 5 MG/5ML injection    Anesthesia Intra-op Dongell, Tyrone Nine, CRNA   2 mg at 02/07/18 0715    Allergies  Allergen Reactions  . Gadolinium Derivatives Hives and Itching    Pt began having hives and itching about 5-7 minutes after contrast administered. Dr Weber Cooks evaluated pt and had Katie RN administer 50 mg Benedryl by IV. Dr Weber Cooks cleared pt to go home.  . Adhesive [Tape] Other (See Comments)    Turns skin red  . Sulfa Antibiotics Other (See Comments)    Childhood allergy    Review of Systems   Has received the first of 2 Covid vaccines She denies any symptoms of Covid viral infection-chest pain shortness of breath productive cough fever fatigue No symptoms of CHF Weight stable No hoarseness or difficulty swallowing No dizziness or syncope BP 134/81 (BP Location: Left Arm, Patient Position: Sitting, Cuff Size: Normal)   Pulse 62   Temp 97.7 F (36.5 C)   Resp 16   Ht 5' 1.5" (1.562 m)   Wt 125 lb (56.7 kg)   SpO2 96% Comment: RA  BMI 23.24 kg/m  Physical Exam      Exam    General- alert and comfortable    Neck- no JVD, no cervical adenopathy palpable, no carotid  bruit   Lungs- clear without rales, wheezes   Cor- regular rate and rhythm, no murmur , gallop   Abdomen- soft, non-tender   Extremities - warm, non-tender, minimal edema   Neuro- oriented, appropriate, no focal weakness   Diagnostic Tests: CT images show postop changes in the left lung field. Baseline changes of emphysema.  No new suspicious nodules or adenopathy. Impression: Doing well 1 year after left upper lobectomy for 2 cm small cell carcinoma lung.  The patient was recommended postoperative chemotherapy but declined.  Plan: Return in 6 months with CT scan of chest.   Len Childs, MD [facility] [phone]

## 2019-06-07 ENCOUNTER — Other Ambulatory Visit: Payer: Self-pay

## 2019-06-07 ENCOUNTER — Encounter: Payer: Self-pay | Admitting: Cardiovascular Disease

## 2019-06-07 ENCOUNTER — Ambulatory Visit (INDEPENDENT_AMBULATORY_CARE_PROVIDER_SITE_OTHER): Payer: Medicare Other | Admitting: Cardiovascular Disease

## 2019-06-07 VITALS — BP 144/78 | HR 54 | Ht 61.5 in | Wt 113.0 lb

## 2019-06-07 DIAGNOSIS — I2510 Atherosclerotic heart disease of native coronary artery without angina pectoris: Secondary | ICD-10-CM

## 2019-06-07 DIAGNOSIS — I10 Essential (primary) hypertension: Secondary | ICD-10-CM

## 2019-06-07 DIAGNOSIS — E7800 Pure hypercholesterolemia, unspecified: Secondary | ICD-10-CM | POA: Diagnosis not present

## 2019-06-07 LAB — LIPID PANEL
Chol/HDL Ratio: 3.9 ratio (ref 0.0–4.4)
Cholesterol, Total: 149 mg/dL (ref 100–199)
HDL: 38 mg/dL — ABNORMAL LOW (ref >39)
LDL Chol Calc (NIH): 90 mg/dL (ref 0–99)
Triglycerides: 112 mg/dL (ref 0–149)
VLDL Cholesterol Cal: 21 mg/dL (ref 5–40)

## 2019-06-07 LAB — HEPATIC FUNCTION PANEL
ALT: 26 IU/L (ref 0–32)
AST: 23 IU/L (ref 0–40)
Albumin: 4 g/dL (ref 3.7–4.7)
Alkaline Phosphatase: 87 IU/L (ref 48–121)
Bilirubin Total: 0.6 mg/dL (ref 0.0–1.2)
Bilirubin, Direct: 0.14 mg/dL (ref 0.00–0.40)
Total Protein: 6.7 g/dL (ref 6.0–8.5)

## 2019-06-07 MED ORDER — LISINOPRIL 10 MG PO TABS
10.0000 mg | ORAL_TABLET | Freq: Every day | ORAL | 3 refills | Status: AC
Start: 2019-06-07 — End: ?

## 2019-06-07 NOTE — Progress Notes (Signed)
Chief Complaint  Patient presents with  . Follow-up    CAD   History of Present Illness: 79 yo female with history of CAD s/p CABG, HTN, HLD, COPD. Small cell lung cancer, anxiety and Barrett's esophagus who is here today for cardiac follow up. Cardiac cath in 2013 showed a moderate 70% stenosis in the first obtuse marginal branch and a 90% stenosis in the mid LAD. I treated the LAD with overlapping drug eluting stents in 2013 and treated the obtuse marginal branch with a drug eluting stent in February 2014. Cardiac cath 12/14/12 with patent stents, moderate disease. IVUS of left main and proximal LAD confirmed mild plaque disease. Repeat cath April 2016 and June 2017 with moderate non-obstructive CAD, normal filling pressures. I saw her in the office August 2019 and her chest pain had worsened. Cardiac cath August 2019 with progression of disease in her distal left main involving the ostial LAD, ostial Circumflex and ostial intermediate branch. She underwent 4V CABG on 09/24/17. (LIMA to LAD, SVG to intermediate, SVG to OM, SVG to RCA). Following her surgery she had subcutaneous emphysema with no pneumothorax. Echo 09/16/17 with QQVZ=56-38%, grade 1 diastolic dysfunction. No significant valve disease. She was found to have a solitary pulmonary nodule in the left upper lobe at the time of her surgery which turned out to be small cell lung cancer. She underwent left upper lobectomy February 2020.   She is here today for follow up. The patient denies any chest pain, dyspnea, palpitations, lower extremity edema, orthopnea, PND, dizziness, near syncope or syncope.   Primary Care Physician: Etter Sjogren, FNP  Past Medical History:  Diagnosis Date  . AKI (acute kidney injury) ([place]) 05/16/2017  . Anxiety   . Barrett's esophagus without dysplasia   . CAD (coronary artery disease)    a. LHC 10/16/11: dLM 20%, mLAD 90%, pOM1 30%, mOM1 70% (FFR not hemodynamically significant), prox and mid RCA 30%, EF 65%;   b. PCI 10/16/11: Promus DES x 2 to mLAD  . Cancer ([place])    left upper lobe  . COPD (chronic obstructive pulmonary disease) ([place])   . Depression   . Diverticulosis   . Essential hypertension   . Family history of adverse reaction to anesthesia    mother had "breathing problems"  . GERD (gastroesophageal reflux disease)   . Hyperlipidemia   . Rectocele     Past Surgical History:  Procedure Laterality Date  . ABDOMINAL HYSTERECTOMY    . CARDIAC CATHETERIZATION    . CARDIAC CATHETERIZATION N/A 06/21/2015   Procedure: Right/Left Heart Cath and Coronary Angiography;  Surgeon: [name], MD;  Location: [facility];  Service: Cardiovascular;  Laterality: N/A;  . CARDIAC CATHETERIZATION  09/16/2017  . CATARACT EXTRACTION W/ INTRAOCULAR LENS  IMPLANT, BILATERAL Bilateral   . COLONOSCOPY  01/2008   Multiple diverticula in desc and sigm colon  . CORONARY ARTERY BYPASS GRAFT N/A 09/24/2017   Procedure: CORONARY ARTERY BYPASS GRAFTING (CABG) x4 using mammary artery and saphenous vein. LIMA to LAD, SVG to RCA, SVG to RAMUS, SVT to OM. Endoscopic saphenous vein harvest.;  Surgeon: Ivin Poot, MD;  Location: [facility];  Service: Open Heart Surgery;  Laterality: N/A;  . ESOPHAGOGASTRODUODENOSCOPY  01/2008   Barrett's esophagus, gastritis, no h.pylori, due follow-up 01/2011  . ESOPHAGOGASTRODUODENOSCOPY  04/06/2011   Procedure: ESOPHAGOGASTRODUODENOSCOPY (EGD);  Surgeon: Danie Binder, MD;  [facility];  Service: Endoscopy;  Laterality: N/A;  8:30  . FLEXIBLE SIGMOIDOSCOPY  08/11/2010 RECTAL PAIN/PRESSURE   SML IH, TICS  . FRACTIONAL FLOW RESERVE WIRE N/A 10/16/2011   Procedure: FRACTIONAL FLOW RESERVE WIRE;  Surgeon: [name], MD;  Location: [facility] C[facility];  Service: Cardiovascular;  Laterality: N/A;  . IR PERC PLEURAL DRAIN W/INDWELL CATH W/IMG GUIDE  10/01/2017  . LEFT HEART CATH AND CORONARY ANGIOGRAPHY N/A 09/16/2017   Procedure: LEFT HEART CATH AND CORONARY  ANGIOGRAPHY;  Surgeon: [name], MD;  Location: [place] [facility];  Service: Cardiovascular;  Laterality: N/A;  . LEFT HEART CATHETERIZATION WITH CORONARY ANGIOGRAM N/A 03/07/2012   Procedure: LEFT HEART CATHETERIZATION WITH CORONARY ANGIOGRAM;  Surgeon: [name], MD;  Location: [facility] CATH LAB;  Service: Cardiovascular;  Laterality: N/A;  . LEFT HEART CATHETERIZATION WITH CORONARY ANGIOGRAM N/A 12/14/2012   Procedure: LEFT HEART CATHETERIZATION WITH CORONARY ANGIOGRAM;  Surgeon: [name], MD;  Location: [facility] CATH LAB;  Service: Cardiovascular;  Laterality: N/A;  . LEFT HEART CATHETERIZATION WITH CORONARY ANGIOGRAM N/A 05/07/2014   Procedure: LEFT HEART CATHETERIZATION WITH CORONARY ANGIOGRAM;  Surgeon: [name], MD;  Location: [facility] CATH LAB;  Service: Cardiovascular;  Laterality: N/A;  . PARTIAL HYSTERECTOMY  1980s  . TEE WITHOUT CARDIOVERSION N/A 09/24/2017   Procedure: TRANSESOPHAGEAL ECHOCARDIOGRAM (TEE);  Surgeon: Prescott Gum, Collier Salina, MD;  Location: [place];  Service: Open Heart Surgery;  Laterality: N/A;  . VIDEO ASSISTED THORACOSCOPY (VATS)/ LOBECTOMY Left 03/08/2018   Procedure: VIDEO ASSISTED THORACOSCOPY (VATS)/ LOBECTOMY;  Surgeon: Ivin Poot, MD;  Location: [place];  Service: Thoracic;  Laterality: Left;    Current Outpatient Medications  Medication Sig Dispense Refill  . acetaminophen (TYLENOL) 325 MG tablet Take 2 tablets (650 mg total) by mouth every 6 (six) hours as needed for mild pain (or Fever >/= 101). (Patient taking differently: Take 325 mg by mouth every 6 (six) hours as needed for mild pain (or Fever >/= 101). )    . ALPRAZolam (XANAX) 0.25 MG tablet Take 0.25 mg by mouth 2 (two) times daily as needed for anxiety.     Marland Kitchen atorvastatin (LIPITOR) 10 MG tablet Take 1 tablet by mouth daily.    . Carboxymethylcellul-Glycerin (LUBRICATING EYE DROPS OP) Place 1 drop into both eyes daily as needed (dry eyes).    . memantine (NAMENDA) 10 MG  tablet Take 10 mg by mouth 2 (two) times daily.    . metoprolol tartrate (LOPRESSOR) 25 MG tablet Take 0.5 tablets (12.5 mg total) by mouth 2 (two) times daily. 60 tablet 3  . nitroGLYCERIN (NITROSTAT) 0.4 MG SL tablet Place 1 tablet (0.4 mg total) under the tongue every 5 (five) minutes as needed for chest pain. 25 tablet 3  . pantoprazole (PROTONIX) 20 MG tablet Take 20 mg by mouth daily.    Marland Kitchen venlafaxine XR (EFFEXOR-XR) 37.5 MG 24 hr capsule Take 37.5 mg by mouth at bedtime.    Marland Kitchen zolpidem (AMBIEN) 10 MG tablet Take 10 mg by mouth at bedtime as needed for sleep.    Marland Kitchen lisinopril (ZESTRIL) 10 MG tablet Take 1 tablet (10 mg total) by mouth daily. 90 tablet 3   No current facility-administered medications for this visit.   Facility-Administered Medications Ordered in Other Visits  Medication Dose Route Frequency Provider Last Rate Last Admin  . midazolam (VERSED) 5 MG/5ML injection    Anesthesia Intra-op Dongell, Tyrone Nine, CRNA   2 mg at 02/07/18 0715    Allergies  Allergen Reactions  . Gadolinium Derivatives Hives and Itching  Pt began having hives and itching about 5-7 minutes after contrast administered. Dr Weber Cooks evaluated pt and had Katie RN administer 50 mg Benedryl by IV. Dr Weber Cooks cleared pt to go home.  . Adhesive [Tape] Other (See Comments)    Turns skin red  . Sulfa Antibiotics Other (See Comments)    Childhood allergy    Social History   Socioeconomic History  . Marital status: Married    Spouse name: Not on file  . Number of children: 1  . Years of education: Not on file  . Highest education level: Not on file  Occupational History  . Occupation: Retired Child psychotherapist    Comment: retired    Fish farm manager: RETIRED  Tobacco Use  . Smoking status: Former Smoker    Packs/day: 1.00    Years: 53.00    Pack years: 53.00    Types: Cigarettes    Quit date: 03/20/2007    Years since quitting: 12.2  . Smokeless tobacco: Never Used  Substance and Sexual Activity   . Alcohol use: No  . Drug use: No  . Sexual activity: Yes  Other Topics Concern  . Not on file  Social History Narrative  . Not on file   Social Determinants of Health   Financial Resource Strain:   . Difficulty of Paying Living Expenses:   Food Insecurity:   . Worried About Charity fundraiser in the Last Year:   . Arboriculturist in the Last Year:   Transportation Needs:   . Film/video editor (Medical):   Marland Kitchen Lack of Transportation (Non-Medical):   Physical Activity:   . Days of Exercise per Week:   . Minutes of Exercise per Session:   Stress:   . Feeling of Stress :   Social Connections:   . Frequency of Communication with Friends and Family:   . Frequency of Social Gatherings with Friends and Family:   . Attends Religious Services:   . Active Member of Clubs or Organizations:   . Attends Archivist Meetings:   Marland Kitchen Marital Status:   Intimate Partner Violence:   . Fear of Current or Ex-Partner:   . Emotionally Abused:   Marland Kitchen Physically Abused:   . Sexually Abused:     Family History  Problem Relation Age of Onset  . Coronary artery disease Father 31  . Heart attack Father 102  . GI problems Mother        Perforated colon   . Coronary artery disease Paternal Aunt   . Coronary artery disease Paternal Uncle   . Colon cancer Neg Hx   . Gastric cancer Neg Hx   . Esophageal cancer Neg Hx     Review of Systems:  As stated in the HPI and otherwise negative.   BP (!) 144/78   Pulse (!) 54   Ht 5' 1.5" (1.562 m)   Wt 113 lb (51.3 kg)   SpO2 98%   BMI 21.01 kg/m   Physical Examination:  General: Well developed, well nourished, NAD  HEENT: OP clear, mucus membranes moist  SKIN: warm, dry. No rashes. Neuro: No focal deficits  Musculoskeletal: Muscle strength 5/5 all ext  Psychiatric: Mood and affect normal  Neck: No JVD, no carotid bruits, no thyromegaly, no lymphadenopathy.  Lungs:Clear bilaterally, no wheezes, rhonci, crackles Cardiovascular:  Regular rate and rhythm. No murmurs, gallops or rubs. Abdomen:Soft. Bowel sounds present. Non-tender.  Extremities: No lower extremity edema. Pulses are 2 + in the bilateral DP/PT.  Echo  09/16/17: Left ventricle: The cavity size was normal. Systolic function was   vigorous. The estimated ejection fraction was in the range of 65%   to 70%. Wall motion was normal; there were no regional wall   motion abnormalities. Doppler parameters are consistent with   abnormal left ventricular relaxation (grade 1 diastolic   dysfunction). There was no evidence of elevated ventricular   filling pressure by Doppler parameters. - Aortic valve: There was no regurgitation. - Aortic root: The aortic root was normal in size. - Mitral valve: Calcified annulus. Mildly thickened leaflets .   There was trivial regurgitation. - Right ventricle: The cavity size was normal. Wall thickness was   normal. Systolic function was normal. - Tricuspid valve: There was mild regurgitation. - Pulmonary arteries: Systolic pressure was within the normal   range. - Inferior vena cava: The vessel was normal in size. - Pericardium, extracardiac: There was no pericardial effusion.  Cardiac cath 09/16/17 1. Severe distal left main artery stenosis with hazy appearance involving the ostia of the LAD, intermediate and Circumflex arteries.  2. Severe ostial LAD stenosis. (Best seen in the LAO 43/CAU 18 view). Patent mid LAD stents.  3. Severe ostial intermediate branch stenosis 4. Severe ostial Circumflex stenosis (best seen in the LAO 49/CAU 8 view). Patent obtuse marginal stent 5. Moderately severe stenosis in the mid RCA 6. Normal LV systolic function  Recommendations: Her distal left main is hazy with severe stenosis affecting the LAD, Circumflex and large intermediate branch. She also has a moderately severe RCA stenosis. Her anatomy is not favorable for PCI/stenting. I will admit her to telemetry given her recent unstable  symptoms. Will consult CT surgery to discuss CABG. She is a good candidate for CABG with normal renal function and normal LV systolic function. She has been on Plavix so this will be held today to allow for washout prior to CABG.   EKG:  EKG is notordered today. The ekg ordered today demonstrates    Recent Labs: No results found for requested labs within last 8760 hours.   Lipid Panel Followed in primary care  Wt Readings from Last 3 Encounters:  06/07/19 113 lb (51.3 kg)  03/08/19 125 lb (56.7 kg)  08/31/18 105 lb (47.6 kg)     Other studies Reviewed: Additional studies/ records that were reviewed today include: . Review of the above records demonstrates:    Assessment and Plan:   1. CAD s/p CABG with stable angina: She underwent 4V CABG September 2019. No chest pain. Continue ASA, beta blocker and statin.       2. HTN: BP is elevated. Will increase Lisinopril to 10 mg per day.    3. HLD: Lipids followed in primary care. No recent labs. Continue statin. Check lipids and LFTs today.   Current medicines are reviewed at length with the patient today.  The patient does not have concerns regarding medicines.  The following changes have been made:  no change  Labs/ tests ordered today include:   Orders Placed This Encounter  Procedures  . Lipid panel  . Hepatic function panel  . EKG 12-Lead   Disposition:   F/U with me in 12 months.   Signed, Lauree Chandler, MD 06/07/2019 10:59 AM    [facility] [place], [place], [place]  [address] Phone: [phone]; Fax: [phone]

## 2019-06-07 NOTE — Patient Instructions (Signed)
Medication Instructions:  Your physician has recommended you make the following change in your medication:  1.) increase lisinopril to 10 mg daily (for blood pressure)  *If you need a refill on your cardiac medications before your next appointment, please call your pharmacy*   Lab Work: Today: lipids/liver function panel  If you have labs (blood work) drawn today and your tests are completely normal, you will receive your results only by: Marland Kitchen MyChart Message (if you have MyChart) OR . A paper copy in the mail If you have any lab test that is abnormal or we need to change your treatment, we will call you to review the results.   Testing/Procedures: none   Follow-Up: At [facility], you and your health needs are our priority.  As part of our continuing mission to provide you with exceptional heart care, we have created designated Provider Care Teams.  These Care Teams include your primary Cardiologist (physician) and Advanced Practice Providers (APPs -  Physician Assistants and Nurse Practitioners) who all work together to provide you with the care you need, when you need it. We recommend signing up for the patient portal called "MyChart".  Sign up information is provided on this After Visit Summary.  MyChart is used to connect with patients for Virtual Visits (Telemedicine).  Patients are able to view lab/test results, encounter notes, upcoming appointments, etc.  Non-urgent messages can be sent to your provider as well.   To learn more about what you can do with MyChart, go to [email or web address].    Your next appointment:   12 month(s)  The format for your next appointment:   In Person  Provider:   You may see Lauree Chandler, MD or one of the following Advanced Practice Providers on your designated Care Team:    Melina Copa, PA-C  Ermalinda Barrios, PA-C    Other Instructions

## 2019-06-14 ENCOUNTER — Telehealth: Payer: Self-pay | Admitting: *Deleted

## 2019-06-14 MED ORDER — ATORVASTATIN CALCIUM 20 MG PO TABS
20.0000 mg | ORAL_TABLET | Freq: Every day | ORAL | 3 refills | Status: DC
Start: 2019-06-14 — End: 2020-12-26

## 2019-06-14 NOTE — Telephone Encounter (Signed)
-----   Message from [name], MD sent at 06/08/2019  3:28 PM EDT ----- LDL is 90. Goal is 70. She is on Lipitor 10 mg daily. Can we see if she would be willing to increase her Lipitor to 20 mg daily? Thanks, chris

## 2019-06-14 NOTE — Telephone Encounter (Signed)
Patient in agreement to increase lipitor to 20 mg daily and will go to PCP office around the end of August to get Lipids/Liver function.  Prescription mailed to her home to take with her.  - results to be faxed to Dr. Angelena Form at [phone].

## 2019-07-09 IMAGING — DX DG CHEST 1V PORT
1 series · 1 of 1 positions shown · non-contrast
Comparison: September 28, 2017

CLINICAL DATA: Pneumothoraces

EXAM:
PORTABLE CHEST 1 VIEW

[chest ap]
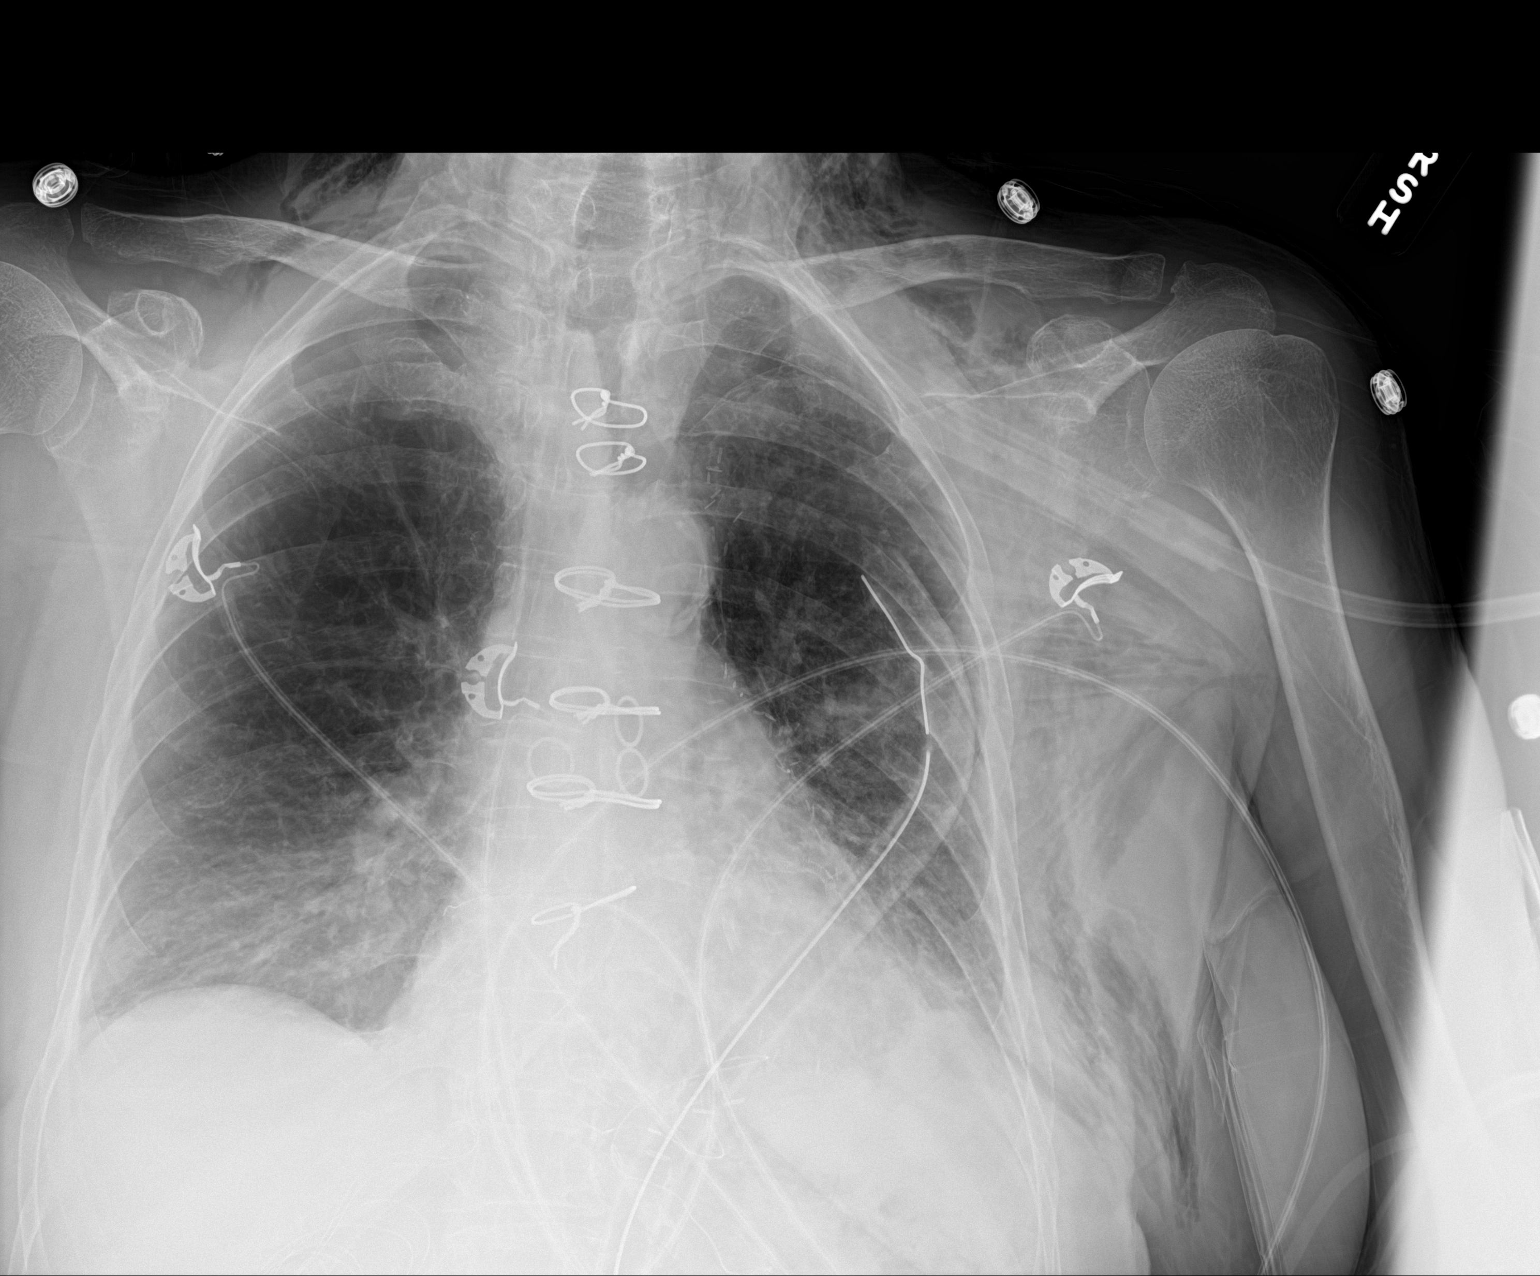

[1 of 1 positions shown; findings below may reference images not displayed]

FINDINGS: Chest tube present on the left. There are apical pneumothoraces
bilaterally, essentially stable from 1 day prior. There is extensive
subcutaneous air, more on the left than on the right. There is
bibasilar atelectasis. No consolidation. Heart size and pulmonary
vascularity are normal. No adenopathy. Patient is status post
coronary artery bypass grafting. There is aortic atherosclerosis. No
appreciable bone lesions.
IMPRESSION: Apical pneumothoraces on each side with extensive subcutaneous air.
Chest tube present on the left, unchanged in position.

Bibasilar atelectasis. No consolidation. Stable cardiac silhouette.
There is aortic atherosclerosis.

Aortic Atherosclerosis ([id number]).

## 2019-07-09 IMAGING — CT CT IMAGE GUIDED DRAINAGE BY PERCUTANEOUS CATHETER
1 of 4 series · 14 of 32 positions shown, 19 images · non-contrast
Comparison: none

CLINICAL DATA: Recent CABG with persistent left pneumothorax
despite chest tube. CT-guided pleural drain catheter requested.

[Series 2: i-spiral 5.0 b40f · axial · 0.82mm/px · z∈[-88,+146]mm · 14 of 77 slices shown, 19 images]
[im 5/77  soft-tissue]
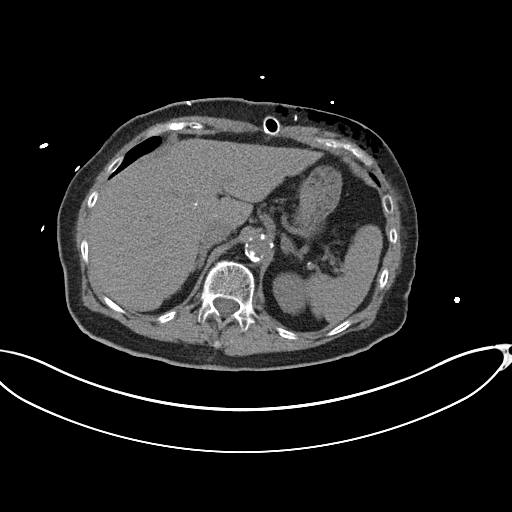
[im 5/77  bone]
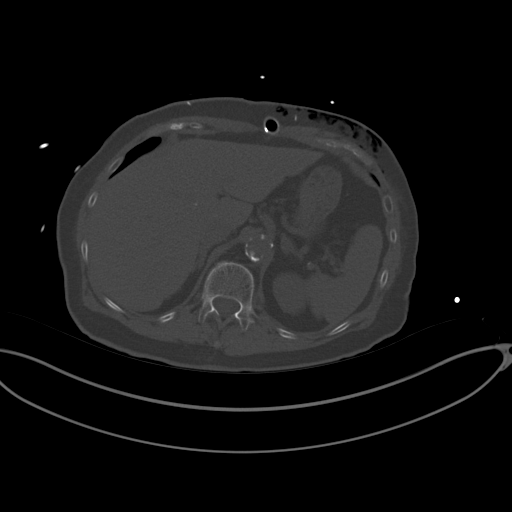
[im 10/77  soft-tissue]
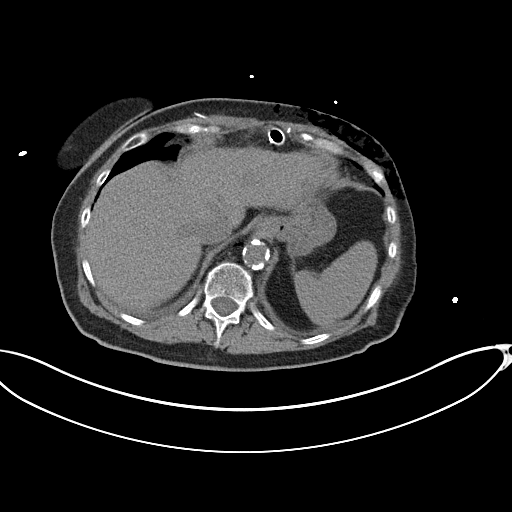
[im 15/77  soft-tissue]
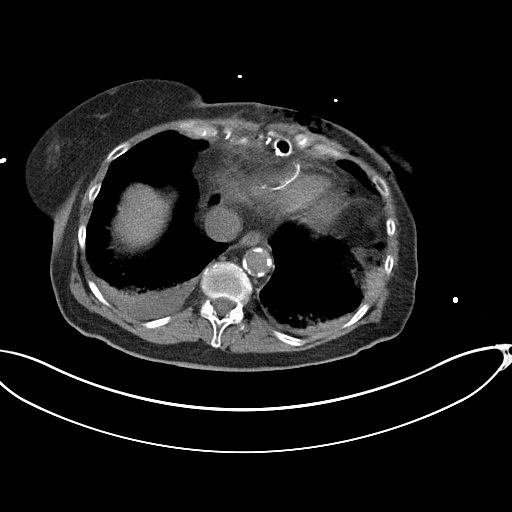
[im 24/77  soft-tissue]
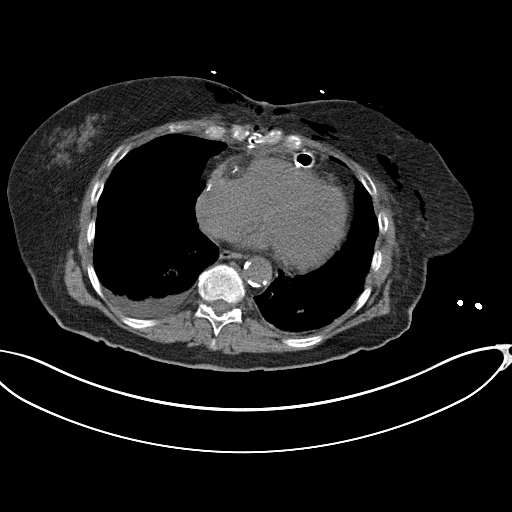
[im 29/77  soft-tissue]
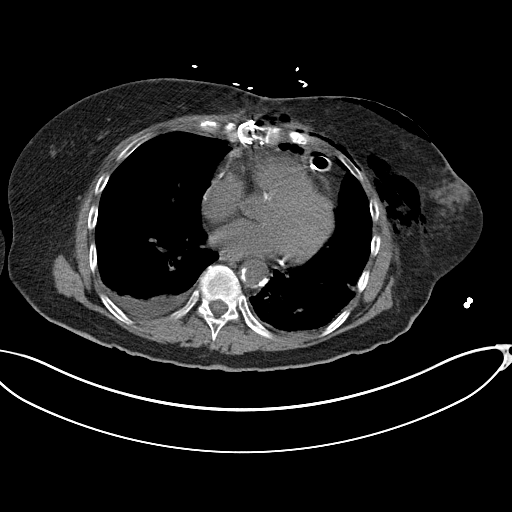
[im 34/77  soft-tissue]
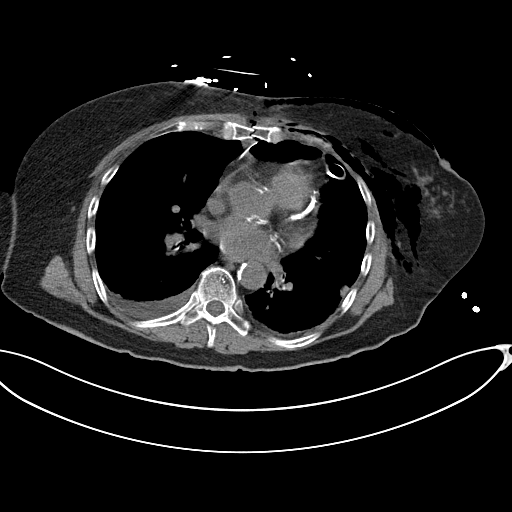
[im 39/77  soft-tissue]
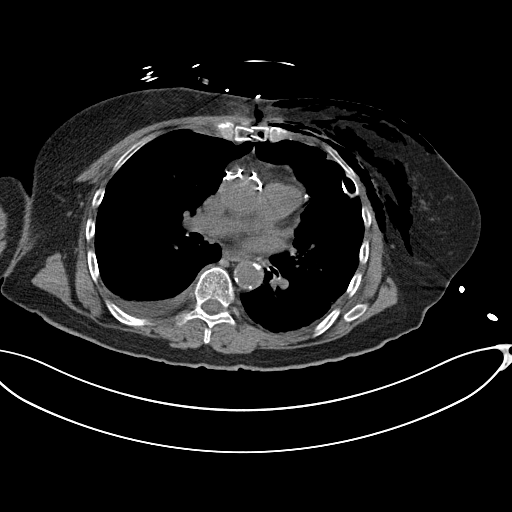
[im 43/77  soft-tissue]
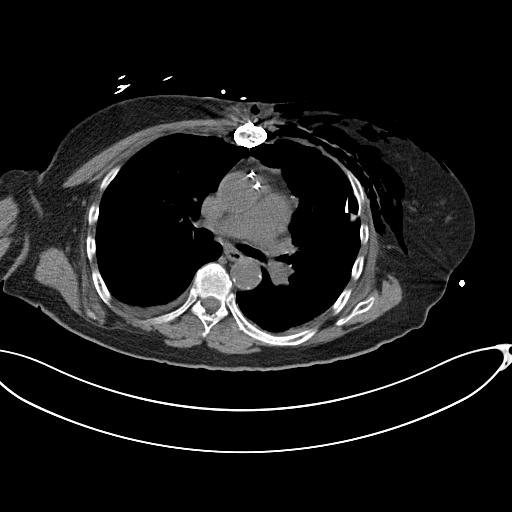
[im 48/77  soft-tissue]
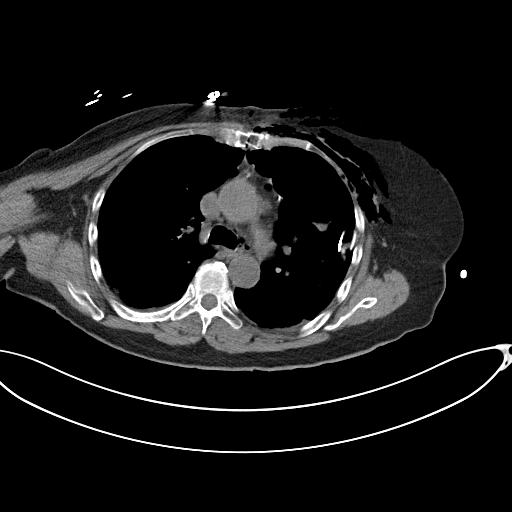
[im 48/77  bone]
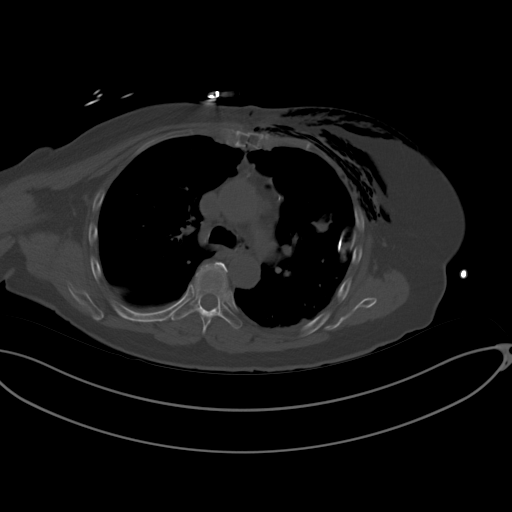
[im 53/77  soft-tissue]
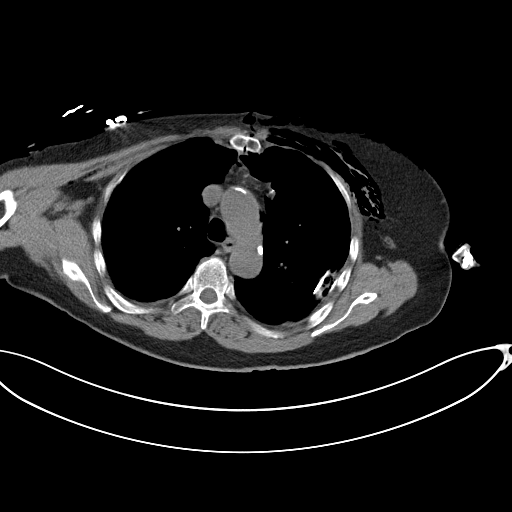
[im 58/77  lung]
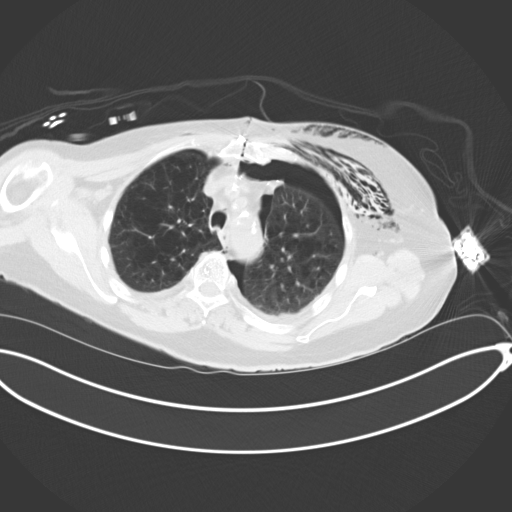
[im 62/77  soft-tissue]
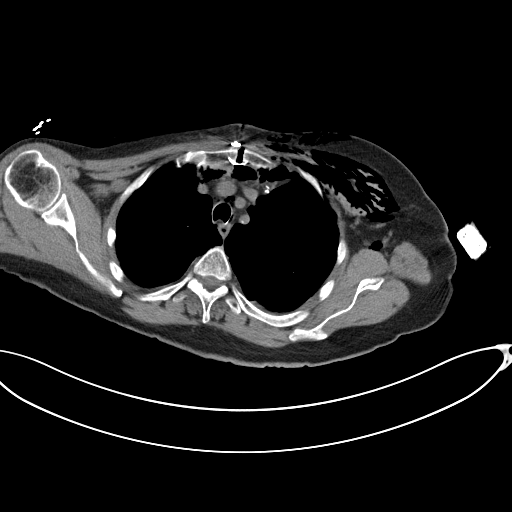
[im 62/77  lung]
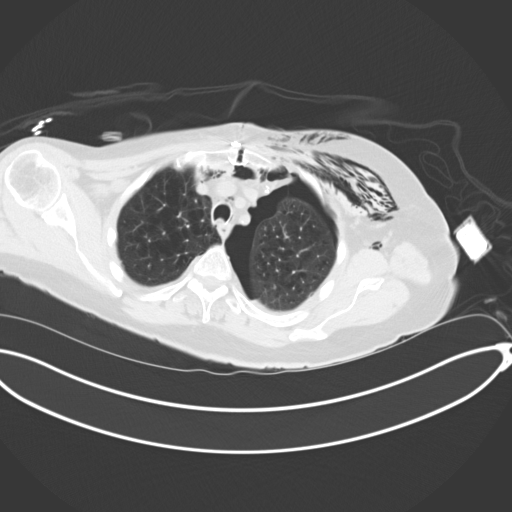
[im 67/77  soft-tissue]
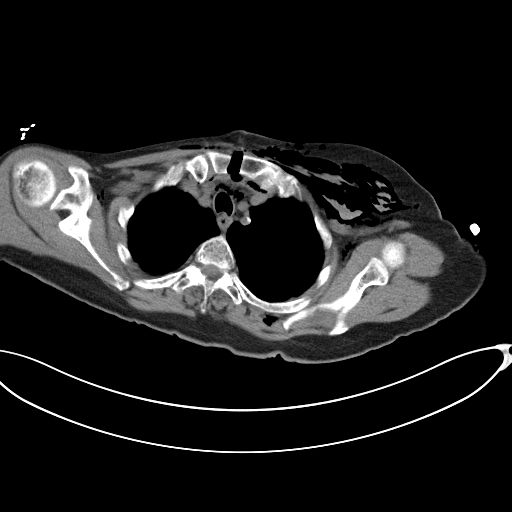
[im 67/77  lung]
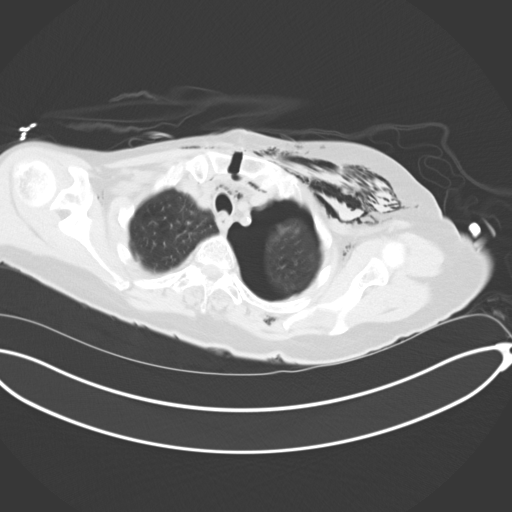
[im 72/77  soft-tissue]
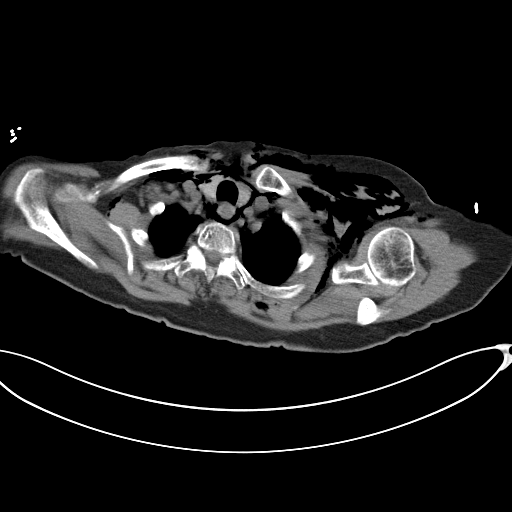
[im 72/77  lung]
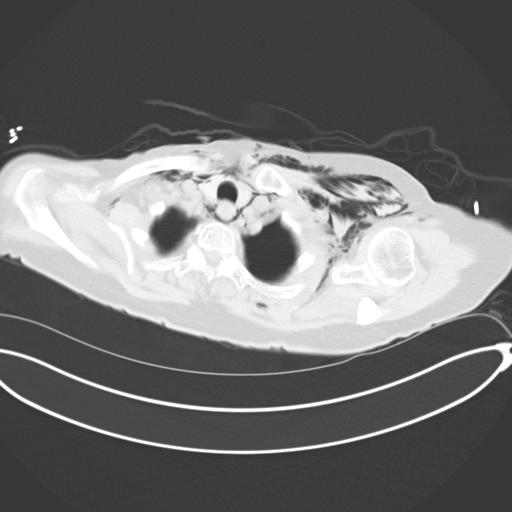

[14 of 32 positions shown; findings below may reference images not displayed]

EXAM:
CT GUIDED LEFT CHEST TUBE PLACEMENT

ANESTHESIA/SEDATION:
Intravenous Fentanyl and Versed were administered as conscious
sedation during continuous monitoring of the patient's level of
consciousness and physiological / cardiorespiratory status by the
radiology RN, with a total moderate sedation time of 16 minutes.

PROCEDURE:
The procedure, risks, benefits, and alternatives were explained to
the patient. Questions regarding the procedure were encouraged and
answered. The patient understands and consents to the procedure.

Select axial scans through the thorax were obtained. An appropriate
skin entry site was determined and marked.

The operative field was prepped with chlorhexidinein a sterile
fashion, and a sterile drape was applied covering the operative
field. A sterile gown and sterile gloves were used for the
procedure. Local anesthesia was provided with 1% Lidocaine.

Under CT fluoroscopic guidance, a 19 gauge percutaneous entry needle
was advanced into the anterior/apical pneumothorax. Gas could be
aspirated. An Amplatz guidewire advanced easily. Tract dilated to
facilitate placement of a 10 French pigtail catheter, directed to
the lung apex. CT confirmed appropriate positioning. The catheter
was secured externally with 0 Prolene suture and StatLock and placed
to Pleur-evac drainage. The patient tolerated the procedure well.

COMPLICATIONS:
None immediate
FINDINGS: Loculated anterior and apical left pneumothorax was identified.
Extensive subcutaneous emphysema. Pigtail drain catheter placed to
the lung apex from an anterior approach as above.
IMPRESSION: 1. Technically successful CT-guided left chest tube placement.

## 2019-08-10 ENCOUNTER — Other Ambulatory Visit: Payer: Self-pay | Admitting: Cardiothoracic Surgery

## 2019-08-10 DIAGNOSIS — C3412 Malignant neoplasm of upper lobe, left bronchus or lung: Secondary | ICD-10-CM

## 2019-09-06 ENCOUNTER — Ambulatory Visit (INDEPENDENT_AMBULATORY_CARE_PROVIDER_SITE_OTHER): Payer: Medicare Other | Admitting: Cardiothoracic Surgery

## 2019-09-06 ENCOUNTER — Encounter: Payer: Self-pay | Admitting: Cardiothoracic Surgery

## 2019-09-06 ENCOUNTER — Other Ambulatory Visit: Payer: Self-pay

## 2019-09-06 ENCOUNTER — Ambulatory Visit
Admission: RE | Admit: 2019-09-06 | Discharge: 2019-09-06 | Disposition: A | Discharge status: Home / Self Care | Payer: Medicare Other | Source: Ambulatory Visit | Attending: Cardiothoracic Surgery | Admitting: Cardiothoracic Surgery

## 2019-09-06 VITALS — BP 150/73 | HR 75 | Temp 97.7°F | Resp 20 | Ht 61.5 in | Wt 111.0 lb

## 2019-09-06 DIAGNOSIS — Z9889 Other specified postprocedural states: Secondary | ICD-10-CM | POA: Diagnosis not present

## 2019-09-06 DIAGNOSIS — I2510 Atherosclerotic heart disease of native coronary artery without angina pectoris: Secondary | ICD-10-CM

## 2019-09-06 DIAGNOSIS — Z902 Acquired absence of lung [part of]: Secondary | ICD-10-CM | POA: Diagnosis not present

## 2019-09-06 DIAGNOSIS — C3412 Malignant neoplasm of upper lobe, left bronchus or lung: Secondary | ICD-10-CM

## 2019-09-06 DIAGNOSIS — Z85118 Personal history of other malignant neoplasm of bronchus and lung: Secondary | ICD-10-CM

## 2019-09-06 NOTE — Progress Notes (Signed)
PCP is Etter Sjogren, FNP Referring Provider is Leonie Man, MD  Chief Complaint  Patient presents with  . 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  In late 2019 she underwent a multivessel CABG for severe multivessel CAD.  The patient is continuing to do well.  She declined postoperative chemotherapy following the surgical pathology demonstrating small cell carcinoma.   The patient denies weight loss, cough, or chest pain.  I personally reviewed her CT scan performed today which shows no evidence of recurrent cancer in the left lung or new areas of risk in the chest.  Past Medical History:  Diagnosis Date  . AKI (acute kidney injury) ([place]) 05/16/2017  . Anxiety   . Barrett's esophagus without dysplasia   . CAD (coronary artery disease)    a. LHC 10/16/11: dLM 20%, mLAD 90%, pOM1 30%, mOM1 70% (FFR not hemodynamically significant), prox and mid RCA 30%, EF 65%;  b. PCI 10/16/11: Promus DES x 2 to mLAD  . Cancer ([place])    left upper lobe  . COPD (chronic obstructive pulmonary disease) ([place])   . Depression   . Diverticulosis   . Essential hypertension   . Family history of adverse reaction to anesthesia    mother had "breathing problems"  . GERD (gastroesophageal reflux disease)   . Hyperlipidemia   . Rectocele     Past Surgical History:  Procedure Laterality Date  . ABDOMINAL HYSTERECTOMY    . CARDIAC CATHETERIZATION    . CARDIAC CATHETERIZATION N/A 06/21/2015   Procedure: Right/Left Heart Cath and Coronary Angiography;  Surgeon: [name], MD;  Location: [facility];  Service: Cardiovascular;  Laterality: N/A;  . CARDIAC CATHETERIZATION  09/16/2017  . CATARACT EXTRACTION W/ INTRAOCULAR LENS  IMPLANT, BILATERAL Bilateral   . COLONOSCOPY  01/2008   Multiple diverticula in desc and sigm colon  . CORONARY  ARTERY BYPASS GRAFT N/A 09/24/2017   Procedure: CORONARY ARTERY BYPASS GRAFTING (CABG) x4 using mammary artery and saphenous vein. LIMA to LAD, SVG to RCA, SVG to RAMUS, SVT to OM. Endoscopic saphenous vein harvest.;  Surgeon: Ivin Poot, MD;  Location: [place];  Service: Open Heart Surgery;  Laterality: N/A;  . ESOPHAGOGASTRODUODENOSCOPY  01/2008   Barrett's esophagus, gastritis, no h.pylori, due follow-up 01/2011  . ESOPHAGOGASTRODUODENOSCOPY  04/06/2011   Procedure: ESOPHAGOGASTRODUODENOSCOPY (EGD);  Surgeon: Danie Binder, MD;  [facility];  Service: Endoscopy;  Laterality: N/A;  8:30  . FLEXIBLE SIGMOIDOSCOPY  08/11/2010 RECTAL PAIN/PRESSURE   SML IH, TICS  . FRACTIONAL FLOW RESERVE WIRE N/A 10/16/2011   Procedure: FRACTIONAL FLOW RESERVE WIRE;  Surgeon: [name], MD;  Location: [facility] CATH LAB;  Service: Cardiovascular;  Laterality: N/A;  . IR PERC PLEURAL DRAIN W/INDWELL CATH W/IMG GUIDE  10/01/2017  . LEFT HEART CATH AND CORONARY ANGIOGRAPHY N/A 09/16/2017   Procedure: LEFT HEART CATH AND CORONARY ANGIOGRAPHY;  Surgeon: [name], MD;  Location: [place] [facility];  Service: Cardiovascular;  Laterality: N/A;  . LEFT HEART CATHETERIZATION WITH CORONARY ANGIOGRAM N/A 03/07/2012   Procedure: LEFT HEART CATHETERIZATION WITH CORONARY ANGIOGRAM;  Surgeon: [name], MD;  Location: [facility] CATH LAB;  Service: Cardiovascular;  Laterality: N/A;  . LEFT HEART CATHETERIZATION WITH CORONARY ANGIOGRAM N/A 12/14/2012   Procedure: LEFT HEART CATHETERIZATION WITH CORONARY ANGIOGRAM;  Surgeon: [name], MD;  Location: [facility] CATH LAB;  Service: Cardiovascular;  Laterality: N/A;  . LEFT HEART CATHETERIZATION WITH CORONARY ANGIOGRAM N/A 05/07/2014   Procedure: LEFT HEART CATHETERIZATION WITH CORONARY ANGIOGRAM;  Surgeon: [name], MD;  Location: [facility] CATH LAB;  Service: Cardiovascular;  Laterality: N/A;  . PARTIAL HYSTERECTOMY  1980s  . TEE WITHOUT  CARDIOVERSION N/A 09/24/2017   Procedure: TRANSESOPHAGEAL ECHOCARDIOGRAM (TEE);  Surgeon: Prescott Gum, Collier Salina, MD;  Location: [place];  Service: Open Heart Surgery;  Laterality: N/A;  . VIDEO ASSISTED THORACOSCOPY (VATS)/ LOBECTOMY Left 03/08/2018   Procedure: VIDEO ASSISTED THORACOSCOPY (VATS)/ LOBECTOMY;  Surgeon: Ivin Poot, MD;  Location: [facility] OR;  Service: Thoracic;  Laterality: Left;    Family History  Problem Relation Age of Onset  . Coronary artery disease Father 53  . Heart attack Father 19  . GI problems Mother        Perforated colon   . Coronary artery disease Paternal Aunt   . Coronary artery disease Paternal Uncle   . Colon cancer Neg Hx   . Gastric cancer Neg Hx   . Esophageal cancer Neg Hx     Social History Social History   Tobacco Use  . Smoking status: Former Smoker    Packs/day: 1.00    Years: 53.00    Pack years: 53.00    Types: Cigarettes    Quit date: 03/20/2007    Years since quitting: 12.4  . Smokeless tobacco: Never Used  Vaping Use  . Vaping Use: Never used  Substance Use Topics  . Alcohol use: No  . Drug use: No    Current Outpatient Medications  Medication Sig Dispense Refill  . acetaminophen (TYLENOL) 325 MG tablet Take 2 tablets (650 mg total) by mouth every 6 (six) hours as needed for mild pain (or Fever >/= 101). (Patient taking differently: Take 325 mg by mouth every 6 (six) hours as needed for mild pain (or Fever >/= 101). )    . ALPRAZolam (XANAX) 0.25 MG tablet Take 0.25 mg by mouth 2 (two) times daily as needed for anxiety.     Marland Kitchen atorvastatin (LIPITOR) 20 MG tablet Take 1 tablet (20 mg total) by mouth daily. 90 tablet 3  . Carboxymethylcellul-Glycerin (LUBRICATING EYE DROPS OP) Place 1 drop into both eyes daily as needed (dry eyes).    Marland Kitchen lisinopril (ZESTRIL) 10 MG tablet Take 1 tablet (10 mg total) by mouth daily. 90 tablet 3  . memantine (NAMENDA) 10 MG tablet Take 10 mg by mouth 2 (two) times daily.    . metoprolol tartrate (LOPRESSOR)  25 MG tablet Take 0.5 tablets (12.5 mg total) by mouth 2 (two) times daily. 60 tablet 3  . nitroGLYCERIN (NITROSTAT) 0.4 MG SL tablet Place 1 tablet (0.4 mg total) under the tongue every 5 (five) minutes as needed for chest pain. 25 tablet 3  . pantoprazole (PROTONIX) 20 MG tablet Take 20 mg by mouth daily.    Marland Kitchen venlafaxine XR (EFFEXOR-XR) 37.5 MG 24 hr capsule Take 37.5 mg by mouth at bedtime.    Marland Kitchen zolpidem (AMBIEN) 10 MG tablet Take 10 mg by mouth at bedtime as needed for sleep.     No current facility-administered medications for this visit.   Facility-Administered Medications Ordered in Other Visits  Medication Dose Route Frequency Provider Last Rate Last Admin  . midazolam (VERSED) 5 MG/5ML injection    Anesthesia Intra-op Shirlyn Goltz, CRNA   2 mg at 02/07/18 0715  Allergies  Allergen Reactions  . Gadolinium Derivatives Hives and Itching    Pt began having hives and itching about 5-7 minutes after contrast administered. Dr Weber Cooks evaluated pt and had Katie RN administer 50 mg Benedryl by IV. Dr Weber Cooks cleared pt to go home.  . Adhesive [Tape] Other (See Comments)    Turns skin red  . Sulfa Antibiotics Other (See Comments)    Childhood allergy    Review of Systems  Patient has been fully vaccinated against COVID-19. The patient denies symptoms of Covid pneumonitis including cough, fever, increased shortness of breath, loss of taste or smell. She denies angina or palpitations or ankle edema. Weight stable, no fevers.  BP (!) 150/73   Pulse 75   Temp 97.7 F (36.5 C) (Skin)   Resp 20   Ht 5' 1.5" (1.562 m)   Wt 111 lb (50.3 kg)   SpO2 95% Comment: RA  BMI 20.63 kg/m  Physical Exam      Exam    General- alert and comfortable    Neck- no JVD, no cervical adenopathy palpable, no carotid bruit   Lungs- clear without rales, wheezes   Cor- regular rate and rhythm, no murmur , gallop   Abdomen- soft, non-tender   Extremities - warm, non-tender, minimal edema    Neuro- oriented, appropriate, no focal weakness   Diagnostic Tests: Chest CT scan personally reviewed as noted above-no evidence of recurrent or new lung neoplastic disease.  Impression: Patient doing well 18 months after left upper lobectomy for resection of small cell carcinoma.  She will be scheduled for surveillance scan at 2 years post resection.  Plan: Return in February 2022 for CT scan of chest with contrast.   Len Childs, MD [facility] [phone]

## 2020-02-06 ENCOUNTER — Other Ambulatory Visit: Payer: Self-pay | Admitting: Thoracic Surgery (Cardiothoracic Vascular Surgery)

## 2020-02-06 DIAGNOSIS — R911 Solitary pulmonary nodule: Secondary | ICD-10-CM

## 2020-03-08 ENCOUNTER — Ambulatory Visit
Admission: RE | Admit: 2020-03-08 | Discharge: 2020-03-08 | Disposition: A | Discharge status: Home / Self Care | Payer: Medicare Other | Source: Ambulatory Visit | Attending: Thoracic Surgery (Cardiothoracic Vascular Surgery) | Admitting: Thoracic Surgery (Cardiothoracic Vascular Surgery)

## 2020-03-08 ENCOUNTER — Ambulatory Visit (INDEPENDENT_AMBULATORY_CARE_PROVIDER_SITE_OTHER): Payer: Medicare Other | Admitting: Thoracic Surgery (Cardiothoracic Vascular Surgery)

## 2020-03-08 ENCOUNTER — Encounter: Payer: Self-pay | Admitting: Thoracic Surgery (Cardiothoracic Vascular Surgery)

## 2020-03-08 ENCOUNTER — Other Ambulatory Visit: Payer: Self-pay

## 2020-03-08 VITALS — BP 160/70 | HR 80 | Resp 20 | Ht 61.5 in | Wt 110.0 lb

## 2020-03-08 DIAGNOSIS — R911 Solitary pulmonary nodule: Secondary | ICD-10-CM

## 2020-03-08 DIAGNOSIS — Z85118 Personal history of other malignant neoplasm of bronchus and lung: Secondary | ICD-10-CM | POA: Diagnosis not present

## 2020-03-08 MED ORDER — IOPAMIDOL (ISOVUE-300) INJECTION 61%
75.0000 mL | Freq: Once | INTRAVENOUS | Status: AC | PRN
  Administered 2020-03-08: 75 mL via INTRAVENOUS

## 2020-03-08 NOTE — Progress Notes (Signed)
[address]       [place] [address]             [phone]                    Rhonda Hughes [place] [facility] Medical Record #[id number] Date of Birth: 24-Jul-1940  Referring: Leonie Man, MD Primary Care: Etter Sjogren, FNP Primary Cardiologist: Lauree Chandler, MD  Chief Complaint:    Chief Complaint  Patient presents with  . Lung Cancer    6 month f/u with Chest CT    History of Present Illness:    LAI Rhonda Hughes 80 y.o. female presents for 2-year follow-up for stage I small cell lung cancer that was resected by Dr. Darcey Nora in February 2020.  She also underwent multivessel bypass surgery in 2019.  Overall she she has no complaints.  She denies any respiratory symptoms.    Past Medical History:  Diagnosis Date  . AKI (acute kidney injury) ([place]) 05/16/2017  . Anxiety   . Barrett's esophagus without dysplasia   . CAD (coronary artery disease)    a. LHC 10/16/11: dLM 20%, mLAD 90%, pOM1 30%, mOM1 70% (FFR not hemodynamically significant), prox and mid RCA 30%, EF 65%;  b. PCI 10/16/11: Promus DES x 2 to mLAD  . Cancer ([place])    left upper lobe  . COPD (chronic obstructive pulmonary disease) ([place])   . Depression   . Diverticulosis   . Essential hypertension   . Family history of adverse reaction to anesthesia    mother had "breathing problems"  . GERD (gastroesophageal reflux disease)   . Hyperlipidemia   . Rectocele     Past Surgical History:  Procedure Laterality Date  . ABDOMINAL HYSTERECTOMY    . CARDIAC CATHETERIZATION    . CARDIAC CATHETERIZATION N/A 06/21/2015   Procedure: Right/Left Heart Cath and Coronary Angiography;  Surgeon: [name], MD;  Location: [place] [facility];  Service: Cardiovascular;  Laterality: N/A;  . CARDIAC CATHETERIZATION  09/16/2017  . CATARACT EXTRACTION W/ INTRAOCULAR LENS  IMPLANT, BILATERAL Bilateral   . COLONOSCOPY  01/2008   Multiple diverticula in desc and sigm colon  . CORONARY ARTERY  BYPASS GRAFT N/A 09/24/2017   Procedure: CORONARY ARTERY BYPASS GRAFTING (CABG) x4 using mammary artery and saphenous vein. LIMA to LAD, SVG to RCA, SVG to RAMUS, SVT to OM. Endoscopic saphenous vein harvest.;  Surgeon: Ivin Poot, MD;  Location: [place];  Service: Open Heart Surgery;  Laterality: N/A;  . ESOPHAGOGASTRODUODENOSCOPY  01/2008   Barrett's esophagus, gastritis, no h.pylori, due follow-up 01/2011  . ESOPHAGOGASTRODUODENOSCOPY  04/06/2011   Procedure: ESOPHAGOGASTRODUODENOSCOPY (EGD);  Surgeon: Danie Binder, MD;  [facility];  Service: Endoscopy;  Laterality: N/A;  8:30  . FLEXIBLE SIGMOIDOSCOPY  08/11/2010 RECTAL PAIN/PRESSURE   SML IH, TICS  . FRACTIONAL FLOW RESERVE WIRE N/A 10/16/2011   Procedure: FRACTIONAL FLOW RESERVE WIRE;  Surgeon: [name], MD;  Location[facility] CATH LAB;  Service: Cardiovascular;  Laterality: N/A;  . IR PERC PLEURAL DRAIN W/INDWELL CATH W/IMG GUIDE  10/01/2017  . LEFT HEART CATH AND CORONARY ANGIOGRAPHY N/A 09/16/2017   Procedure: LEFT HEART CATH AND CORONARY ANGIOGRAPHY;  Surgeon: [name], MD;  Location: [place] [facility];  Service: Cardiovascular;  Laterality: N/A;  . LEFT HEART CATHETERIZATION WITH CORONARY ANGIOGRAM N/A 03/07/2012   Procedure: LEFT HEART CATHETERIZATION WITH CORONARY ANGIOGRAM;  Surgeon: [name], MD;  Location: [place] CATH LAB;  Service: Cardiovascular;  Laterality: N/A;  . LEFT HEART CATHETERIZATION WITH CORONARY ANGIOGRAM N/A 12/14/2012   Procedure: LEFT HEART CATHETERIZATION WITH CORONARY ANGIOGRAM;  Surgeon: [name], MD;  Location: [facility] CATH LAB;  Service: Cardiovascular;  Laterality: N/A;  . LEFT HEART CATHETERIZATION WITH CORONARY ANGIOGRAM N/A 05/07/2014   Procedure: LEFT HEART CATHETERIZATION WITH CORONARY ANGIOGRAM;  Surgeon: [name], MD;  Location: [facility] CATH LAB;  Service: Cardiovascular;  Laterality: N/A;  . PARTIAL HYSTERECTOMY  1980s  . TEE WITHOUT  CARDIOVERSION N/A 09/24/2017   Procedure: TRANSESOPHAGEAL ECHOCARDIOGRAM (TEE);  Surgeon: Prescott Gum, Collier Salina, MD;  Location: [place];  Service: Open Heart Surgery;  Laterality: N/A;  . VIDEO ASSISTED THORACOSCOPY (VATS)/ LOBECTOMY Left 03/08/2018   Procedure: VIDEO ASSISTED THORACOSCOPY (VATS)/ LOBECTOMY;  Surgeon: Ivin Poot, MD;  Location: [facility] OR;  Service: Thoracic;  Laterality: Left;    Family History  Problem Relation Age of Onset  . Coronary artery disease Father 53  . Heart attack Father 64  . GI problems Mother        Perforated colon   . Coronary artery disease Paternal Aunt   . Coronary artery disease Paternal Uncle   . Colon cancer Neg Hx   . Gastric cancer Neg Hx   . Esophageal cancer Neg Hx      Social History   Tobacco Use  Smoking Status Former Smoker  . Packs/day: 1.00  . Years: 53.00  . Pack years: 53.00  . Types: Cigarettes  . Quit date: 03/20/2007  . Years since quitting: 12.9  Smokeless Tobacco Never Used    Social History   Substance and Sexual Activity  Alcohol Use No     Allergies  Allergen Reactions  . Gadolinium Derivatives Hives and Itching    Pt began having hives and itching about 5-7 minutes after contrast administered. Dr Weber Cooks evaluated pt and had Katie RN administer 50 mg Benedryl by IV. Dr Weber Cooks cleared pt to go home.  . Adhesive [Tape] Other (See Comments)    Turns skin red  . Sulfa Antibiotics Other (See Comments)    Childhood allergy    Current Outpatient Medications  Medication Sig Dispense Refill  . acetaminophen (TYLENOL) 325 MG tablet Take 2 tablets (650 mg total) by mouth every 6 (six) hours as needed for mild pain (or Fever >/= 101). (Patient taking differently: Take 325 mg by mouth every 6 (six) hours as needed for mild pain (or Fever >/= 101).)    . ALPRAZolam (XANAX) 0.25 MG tablet Take 0.25 mg by mouth 2 (two) times daily as needed for anxiety.     Marland Kitchen atorvastatin (LIPITOR) 20 MG tablet Take 1 tablet (20 mg total) by  mouth daily. 90 tablet 3  . Carboxymethylcellul-Glycerin (LUBRICATING EYE DROPS OP) Place 1 drop into both eyes daily as needed (dry eyes).    Marland Kitchen lisinopril (ZESTRIL) 10 MG tablet Take 1 tablet (10 mg total) by mouth daily. 90 tablet 3  . memantine (NAMENDA) 10 MG tablet Take 10 mg by mouth 2 (two) times daily.    . metoprolol tartrate (LOPRESSOR) 25 MG tablet Take 0.5 tablets (12.5 mg total) by mouth 2 (two) times daily. 60 tablet 3  . nitroGLYCERIN (NITROSTAT) 0.4 MG SL tablet Place 1 tablet (0.4 mg total) under the tongue every 5 (five) minutes as needed for chest pain. 25 tablet 3  . pantoprazole (PROTONIX) 20 MG tablet Take 20 mg by mouth daily.    Marland Kitchen venlafaxine  XR (EFFEXOR-XR) 37.5 MG 24 hr capsule Take 37.5 mg by mouth at bedtime.    Marland Kitchen zolpidem (AMBIEN) 10 MG tablet Take 10 mg by mouth at bedtime as needed for sleep.     No current facility-administered medications for this visit.   Facility-Administered Medications Ordered in Other Visits  Medication Dose Route Frequency Provider Last Rate Last Admin  . midazolam (VERSED) 5 MG/5ML injection    Anesthesia Intra-op Dongell, Tyrone Nine, CRNA   2 mg at 02/07/18 0715    Review of Systems  All other systems reviewed and are negative.   PHYSICAL EXAMINATION: BP (!) 160/70   Pulse 80   Resp 20   Ht 5' 1.5" (1.562 m)   Wt 110 lb (49.9 kg)   SpO2 95% Comment: RA  BMI 20.45 kg/m   Physical Exam Constitutional:      General: She is not in acute distress.    Appearance: Normal appearance. She is normal weight. She is not ill-appearing.  Cardiovascular:     Rate and Rhythm: Normal rate.  Pulmonary:     Effort: Pulmonary effort is normal.  Abdominal:     General: Abdomen is flat.  Musculoskeletal:        General: Normal range of motion.  Neurological:     General: No focal deficit present.     Mental Status: She is alert and oriented to person, place, and time.      Diagnostic Studies & Laboratory data:     Recent Radiology  Findings:   CT Chest W Contrast  Result Date: 03/08/2020 CLINICAL DATA:  Restaging small cell lung cancer. Status post left upper lobectomy. EXAM: CT CHEST WITH CONTRAST TECHNIQUE: Multidetector CT imaging of the chest was performed during intravenous contrast administration. CONTRAST:  45mL ISOVUE-300 IOPAMIDOL (ISOVUE-300) INJECTION 61% COMPARISON:  09/06/2019 FINDINGS: Cardiovascular: The heart size is normal. No pericardial effusion. Previous CABG procedure. Aortic atherosclerosis. Mediastinum/Nodes: The trachea appears patent and is midline. Normal appearance of the esophagus. No enlarged axillary, supraclavicular, mediastinal, or hilar adenopathy. Lungs/Pleura: Status post left upper lobectomy. Advanced centrilobular emphysema. No pleural effusion, airspace consolidation, or atelectasis. Calcified granuloma noted in the left lower lobe, image 106/8. No suspicious pulmonary nodule or mass identified. Upper Abdomen: Small cyst within the liver is unchanged measuring 1.1 x 0.6 cm. No acute findings within the imaged portions of the upper abdomen. Musculoskeletal: No chest wall abnormality. No acute or significant osseous findings. Remote healed left lateral rib fractures. IMPRESSION: 1. Stable CT of the chest. Status post left upper lobectomy. No specific findings identified to suggest tumor recurrence or metastatic disease. 2. Aortic Atherosclerosis (ICD10-I70.0) and Emphysema (ICD10-J43.9). Electronically Signed   By: Kerby Moors M.D.   On: 03/08/2020 11:00       I have independently reviewed the above radiology studies  and reviewed the findings with the patient.   Recent Lab Findings: Lab Results  Component Value Date   WBC 9.4 03/15/2018   HGB 9.7 (L) 03/15/2018   HCT 29.1 (L) 03/15/2018   PLT 261 03/15/2018   GLUCOSE 85 03/19/2018   CHOL 149 06/07/2019   TRIG 112 06/07/2019   HDL 38 (L) 06/07/2019   LDLCALC 90 06/07/2019   ALT 26 06/07/2019   AST 23 06/07/2019   NA 131 (L)  03/19/2018   K 4.4 03/19/2018   CL 99 03/19/2018   CREATININE 0.65 03/19/2018   BUN 7 (L) 03/19/2018   CO2 19 (L) 03/19/2018   TSH 0.467 04/24/2015  INR 0.92 03/03/2018   HGBA1C 5.8 (H) 09/17/2017        Assessment / Plan:   80 year old female with history of stage I small cell lung cancer who underwent surgical resection by Dr. Darcey Nora in February 2020.  She opted out of adjuvant therapy.  She comes today for 2-year follow-up.  I personally reviewed her cross-sectional imaging and there is no new lesions concerning for recurrence.  She will follow-up again in 1 year with a low-dose CT scan for ongoing surveillance.  This will be done as a virtual visit.      Lajuana Matte 03/08/2020 3:39 PM

## 2020-09-22 ENCOUNTER — Other Ambulatory Visit: Payer: Self-pay

## 2020-09-22 ENCOUNTER — Encounter (HOSPITAL_COMMUNITY): Payer: Self-pay | Admitting: Emergency Medicine

## 2020-09-22 ENCOUNTER — Emergency Department (HOSPITAL_COMMUNITY)
Admission: EM | Admit: 2020-09-22 | Discharge: 2020-09-22 | Disposition: A | Discharge status: Home / Self Care | Payer: Medicare Other | Attending: Emergency Medicine | Admitting: Emergency Medicine

## 2020-09-22 ENCOUNTER — Emergency Department (HOSPITAL_COMMUNITY): Payer: Medicare Other

## 2020-09-22 DIAGNOSIS — I10 Essential (primary) hypertension: Secondary | ICD-10-CM | POA: Diagnosis not present

## 2020-09-22 DIAGNOSIS — Z87891 Personal history of nicotine dependence: Secondary | ICD-10-CM | POA: Insufficient documentation

## 2020-09-22 DIAGNOSIS — Z85118 Personal history of other malignant neoplasm of bronchus and lung: Secondary | ICD-10-CM | POA: Insufficient documentation

## 2020-09-22 DIAGNOSIS — I25110 Atherosclerotic heart disease of native coronary artery with unstable angina pectoris: Secondary | ICD-10-CM | POA: Diagnosis not present

## 2020-09-22 DIAGNOSIS — Z79899 Other long term (current) drug therapy: Secondary | ICD-10-CM | POA: Insufficient documentation

## 2020-09-22 DIAGNOSIS — R0602 Shortness of breath: Secondary | ICD-10-CM | POA: Insufficient documentation

## 2020-09-22 DIAGNOSIS — E876 Hypokalemia: Secondary | ICD-10-CM | POA: Diagnosis not present

## 2020-09-22 DIAGNOSIS — Z951 Presence of aortocoronary bypass graft: Secondary | ICD-10-CM | POA: Diagnosis not present

## 2020-09-22 DIAGNOSIS — J449 Chronic obstructive pulmonary disease, unspecified: Secondary | ICD-10-CM | POA: Diagnosis not present

## 2020-09-22 LAB — BASIC METABOLIC PANEL
Anion gap: 7 (ref 5–15)
BUN: 7 mg/dL — ABNORMAL LOW (ref 8–23)
CO2: 26 mmol/L (ref 22–32)
Calcium: 9 mg/dL (ref 8.9–10.3)
Chloride: 105 mmol/L (ref 98–111)
Creatinine, Ser: 0.82 mg/dL (ref 0.44–1.00)
GFR, Estimated: >60 mL/min (ref >60)
Glucose, Bld: 96 mg/dL (ref 70–99)
Potassium: 3.1 mmol/L — ABNORMAL LOW (ref 3.5–5.1)
Sodium: 138 mmol/L (ref 135–145)

## 2020-09-22 LAB — TROPONIN I (HIGH SENSITIVITY): Troponin I (High Sensitivity): 8 ng/L (ref <18)

## 2020-09-22 LAB — CBC
HCT: 38.6 % (ref 36.0–46.0)
Hemoglobin: 12.7 g/dL (ref 12.0–15.0)
MCH: 31.4 pg (ref 26.0–34.0)
MCHC: 32.9 g/dL (ref 30.0–36.0)
MCV: 95.3 fL (ref 80.0–100.0)
Platelets: 171 10*3/uL (ref 150–400)
RBC: 4.05 MIL/uL (ref 3.87–5.11)
RDW: 13.2 % (ref 11.5–15.5)
WBC: 7.1 10*3/uL (ref 4.0–10.5)
nRBC: 0 % (ref 0.0–0.2)

## 2020-09-22 MED ORDER — POTASSIUM CHLORIDE ER 10 MEQ PO TBCR: 10.0000 meq | EXTENDED_RELEASE_TABLET | Freq: Every day | ORAL | 0 refills | Status: DC

## 2020-09-22 NOTE — ED Notes (Signed)
Unable to sign mse d/t broke signature [pad

## 2020-09-22 NOTE — ED Triage Notes (Signed)
Pt c/o of chest tightness x1year that comes and goes. Pts husband made her come in today to have it evaluated. Pt has hx of bypass surgery x4 years ago.

## 2020-09-22 NOTE — Discharge Instructions (Addendum)
Your potassium was slightly low today on your laboratory work.  I have given you a short course of oral potassium to take for the next few days.  You will need to have your potassium rechecked by your regular doctor.  Additionally you will need to keep a log of your blood pressures and bring this to your primary care provider's appointment as you may need to be started on blood pressure medications if it continues to remain high.  I have given you an ambulatory referral to cardiology.  They will reach out to you to set up an appointment for follow-up.  Please return to the emergency department for any new or worsening symptoms in the meantime.

## 2020-09-22 NOTE — ED Provider Notes (Signed)
[facility] EMERGENCY DEPARTMENT Provider Note   CSN: [id number] Arrival date & time: 09/22/20  1627     History Chief Complaint  Patient presents with   Chest Pain    Rhonda Hughes is a 80 y.o. female.  HPI   Pt is a 80 y/o female with a h/o AKI, anxiety, barretts esophagus, CAD, CA, COPD, depression, diverticulosis, htn, gerd, hld, who presents to the ED today for eval of SOB. She states that she had a CABG about 4 years ago. She states that ever since then, every once in a while she gets some shortness of breath. She states these episodes usually occur for about an hour at a time and they resolve on their own. Reports that the episodes occur randomly and are not associated with exertion. She had an episode either yesterday or the day before (she cannot remember). She states that her husband was worried about her symptoms and wanted her to come to the ED. She states "I came up here to satisfy him" and "I'm perfectly fine".   She denies any current shortness of breath, chest pain, swelling to her legs, nausea, lightheadedness, vomiting.   Past Medical History:  Diagnosis Date   AKI (acute kidney injury) ([place]) 05/16/2017   Anxiety    Barrett's esophagus without dysplasia    CAD (coronary artery disease)    a. LHC 10/16/11: dLM 20%, mLAD 90%, pOM1 30%, mOM1 70% (FFR not hemodynamically significant), prox and mid RCA 30%, EF 65%;  b. 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   Lung cancer ([place]) 02/07/2018   Goals of care,  counseling/discussion 01/13/2018   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   Chest pain with moderate risk for cardiac etiology 09/17/2017   Lung nodule 09/17/2017   Liver nodule 09/17/2017   Unstable angina ([place]) 09/16/2017   Benign essential HTN    AKI (acute kidney injury) ([place]) 05/16/2017   Dehydration symptoms 05/16/2017   Acute kidney injury ([place]) 05/16/2017   Constipation 05/12/2017   Coronary artery disease involving native coronary artery of native heart with unstable angina pectoris (HCC)    Esophageal reflux    CAD (coronary artery disease), native coronary artery 04/24/2015   Hyponatremia 04/24/2015   Pain in the chest    Exertional angina ([place]) 03/08/2012   Crescendo angina ([place]) 03/08/2012   Hypokalemia 03/08/2012   HTN (hypertension)    Chest pain, atypical 10/13/2011   At risk for coronary artery disease 10/13/2011   Dyspnea due to angina equivalent and emphysema (former the dominant cause) 09/13/2011   Barrett's esophagus 02/25/2011   Rectal pressure 06/18/2010   Bowel habit changes 06/18/2010    Past Surgical History:  Procedure Laterality Date   ABDOMINAL HYSTERECTOMY     CARDIAC CATHETERIZATION     CARDIAC CATHETERIZATION N/A 06/21/2015   Procedure: Right/Left Heart Cath and Coronary Angiography;  Surgeon: [name], MD;  Location: [place] CV LAB;  Service: Cardiovascular;  Laterality: N/A;   CARDIAC CATHETERIZATION  09/16/2017   CATARACT EXTRACTION W/ INTRAOCULAR LENS  IMPLANT, BILATERAL Bilateral    COLONOSCOPY  01/2008   Multiple diverticula in desc and sigm colon   CORONARY ARTERY BYPASS GRAFT N/A 09/24/2017   Procedure: CORONARY ARTERY BYPASS GRAFTING (CABG) x4 using mammary artery and saphenous vein. LIMA to LAD, SVG to RCA, SVG to RAMUS, SVT to OM. Endoscopic saphenous vein harvest.;  Surgeon: Ivin Poot, MD;  Location: [place];  Service: Open Heart Surgery;  Laterality: N/A;   ESOPHAGOGASTRODUODENOSCOPY  01/2008   Barrett's esophagus, gastritis, no  h.pylori, due follow-up 01/2011   ESOPHAGOGASTRODUODENOSCOPY  04/06/2011   Procedure: ESOPHAGOGASTRODUODENOSCOPY (EGD);  Surgeon: Danie Binder, MD;  [facility];  Service: Endoscopy;  Laterality: N/A;  8:30   FLEXIBLE SIGMOIDOSCOPY  08/11/2010 RECTAL PAIN/PRESSURE   SML IH, TICS   FRACTIONAL FLOW RESERVE WIRE N/A 10/16/2011   Procedure: FRACTIONAL FLOW RESERVE WIRE;  Surgeon: [name], MD;  Location: [facility] CATH LAB;  Service: Cardiovascular;  Laterality: N/A;   IR PERC PLEURAL DRAIN W/INDWELL CATH W/IMG GUIDE  10/01/2017   LEFT HEART CATH AND CORONARY ANGIOGRAPHY N/A 09/16/2017   Procedure: LEFT HEART CATH AND CORONARY ANGIOGRAPHY;  Surgeon: [name], MD;  Location: [place] CV LAB;  Service: Cardiovascular;  Laterality: N/A;   LEFT HEART CATHETERIZATION WITH CORONARY ANGIOGRAM N/A 03/07/2012   Procedure: LEFT HEART CATHETERIZATION WITH CORONARY ANGIOGRAM;  Surgeon: [name], MD;  Location: [facility] CATH LAB;  Service: Cardiovascular;  Laterality: N/A;   LEFT HEART CATHETERIZATION WITH CORONARY ANGIOGRAM N/A 12/14/2012   Procedure: LEFT HEART CATHETERIZATION WITH CORONARY ANGIOGRAM;  Surgeon: [name], MD;  Location: [facility] CATH LAB;  Service: Cardiovascular;  Laterality: N/A;   LEFT HEART CATHETERIZATION WITH CORONARY ANGIOGRAM N/A 05/07/2014   Procedure: LEFT HEART CATHETERIZATION WITH CORONARY ANGIOGRAM;  Surgeon: [name], MD;  Location: [facility] CATH LAB;  Service: Cardiovascular;  Laterality: N/A;   PARTIAL HYSTERECTOMY  1980s   TEE WITHOUT CARDIOVERSION N/A 09/24/2017   Procedure: TRANSESOPHAGEAL ECHOCARDIOGRAM (TEE);  Surgeon: Prescott Gum, Collier Salina, MD;  Location: [place];  Service: Open Heart Surgery;  Laterality: N/A;   VIDEO ASSISTED THORACOSCOPY (VATS)/ LOBECTOMY Left 03/08/2018   Procedure: VIDEO ASSISTED THORACOSCOPY (VATS)/ LOBECTOMY;  Surgeon: Ivin Poot, MD;  Location: [place];  Service: Thoracic;  Laterality: Left;     OB  History   No obstetric history on file.     Family History  Problem Relation Age of Onset   Coronary artery disease Father 46   Heart attack Father 64   GI problems Mother        Perforated colon    Coronary artery disease Paternal Aunt    Coronary artery disease Paternal Uncle    Colon cancer Neg Hx    Gastric cancer Neg Hx    Esophageal cancer Neg Hx     Social History   Tobacco Use   Smoking status: Former    Packs/day: 1.00    Years: 53.00    Pack years: 53.00    Types: Cigarettes    Quit date: 03/20/2007    Years since quitting: 13.5   Smokeless tobacco: Never  Vaping Use   Vaping Use: Never used  Substance Use Topics   Alcohol use: No   Drug use: No    Home Medications Prior to Admission medications   Medication Sig Start Date End Date Taking? Authorizing Provider  potassium chloride (KLOR-CON) 10  MEQ tablet Take 1 tablet (10 mEq total) by mouth daily for 5 days. 09/22/20 09/27/20 Yes Cyril Railey S, PA-C  acetaminophen (TYLENOL) 325 MG tablet Take 2 tablets (650 mg total) by mouth every 6 (six) hours as needed for mild pain (or Fever >/= 101). Patient taking differently: Take 325 mg by mouth every 6 (six) hours as needed for mild pain (or Fever >/= 101). 02/08/18   Elgie Collard, PA-C  ALPRAZolam Duanne Moron) 0.25 MG tablet Take 0.25 mg by mouth 2 (two) times daily as needed for anxiety.     [provider]  atorvastatin (LIPITOR) 20 MG tablet Take 1 tablet (20 mg total) by mouth daily. 06/14/19   [name], MD  Carboxymethylcellul-Glycerin (LUBRICATING EYE DROPS OP) Place 1 drop into both eyes daily as needed (dry eyes).    [provider]  lisinopril (ZESTRIL) 10 MG tablet Take 1 tablet (10 mg total) by mouth daily. 06/07/19   [name], MD  memantine (NAMENDA) 10 MG tablet Take 10 mg by mouth 2 (two) times daily.    [provider]  metoprolol tartrate (LOPRESSOR) 25 MG tablet Take 0.5 tablets (12.5 mg total) by  mouth 2 (two) times daily. 10/09/17   Barrett, Erin R, PA-C  nitroGLYCERIN (NITROSTAT) 0.4 MG SL tablet Place 1 tablet (0.4 mg total) under the tongue every 5 (five) minutes as needed for chest pain. 09/17/17   Barrett, Evelene Croon, PA-C  pantoprazole (PROTONIX) 20 MG tablet Take 20 mg by mouth daily.    [provider]  venlafaxine XR (EFFEXOR-XR) 37.5 MG 24 hr capsule Take 37.5 mg by mouth at bedtime.    [provider]  zolpidem (AMBIEN) 10 MG tablet Take 10 mg by mouth at bedtime as needed for sleep.    [provider]  niacin (NIASPAN) 500 MG CR tablet Take 500 mg by mouth at bedtime.    04/01/11  [provider]    Allergies    Gadolinium derivatives, Adhesive [tape], and Sulfa antibiotics  Review of Systems   Review of Systems  Constitutional:  Negative for chills and fever.  HENT:  Negative for ear pain and sore throat.   Eyes:  Negative for visual disturbance.  Respiratory:  Negative for cough and shortness of breath.   Cardiovascular:  Negative for chest pain.  Gastrointestinal:  Negative for abdominal pain, constipation, diarrhea, nausea and vomiting.  Genitourinary:  Negative for dysuria and hematuria.  Musculoskeletal:  Negative for back pain.  Skin:  Negative for rash.  Neurological:  Negative for seizures and syncope.  All other systems reviewed and are negative.  Physical Exam Updated Vital Signs BP (!) 188/79   Pulse 72   Temp 97.9 F (36.6 C) (Oral)   Resp 14   Ht 5\' 2"  (1.575 m)   Wt 43.1 kg   SpO2 96%   BMI 17.38 kg/m   Physical Exam Vitals and nursing note reviewed.  Constitutional:      General: She is not in acute distress.    Appearance: She is well-developed.  HENT:     Head: Normocephalic and atraumatic.  Eyes:     Conjunctiva/sclera: Conjunctivae normal.  Cardiovascular:     Rate and Rhythm: Normal rate and regular rhythm.     Heart sounds: Normal heart sounds. No murmur heard. Pulmonary:     Effort: Pulmonary  effort is normal. No respiratory distress.     Breath sounds: Normal breath sounds. No decreased breath sounds, wheezing, rhonchi  or rales.  Abdominal:     Palpations: Abdomen is soft.     Tenderness: There is no abdominal tenderness.  Musculoskeletal:     Cervical back: Neck supple.     Right lower leg: No tenderness. No edema.     Left lower leg: No tenderness. No edema.  Skin:    General: Skin is warm and dry.  Neurological:     Mental Status: She is alert.    ED Results / Procedures / Treatments   Labs (all labs ordered are listed, but only abnormal results are displayed) Labs Reviewed  BASIC METABOLIC PANEL - Abnormal; Notable for the following components:      Result Value   Potassium 3.1 (*)    BUN 7 (*)    All other components within normal limits  CBC  TROPONIN I (HIGH SENSITIVITY)    EKG EKG Interpretation  Date/Time:  Sunday September 22 2020 16:38:52 EDT Ventricular Rate:  68 PR Interval:  122 QRS Duration: 94 QT Interval:  378 QTC Calculation: 401 R Axis:   70 Text Interpretation: Sinus rhythm with Premature supraventricular complexes Nonspecific T wave abnormality Abnormal ECG No significant change since last tracing Confirmed by Sheldon, Charles ([id number]) on 09/22/2020 6:06:16 PM  Radiology DG Chest Port 1 View  Result Date: 09/22/2020 CLINICAL DATA:  Chest pain EXAM: PORTABLE CHEST 1 VIEW COMPARISON:  Chest radiograph dated 06/01/2018 and CT chest dated 03/08/2020. FINDINGS: The heart size is normal. Vascular calcifications are seen in the aortic arch. The lungs are clear without pleural effusion or pneumothorax. Emphysematous changes are noted. Degenerative changes are seen in the spine. Median sternotomy wires are noted. IMPRESSION: No acute pulmonary process. Aortic Atherosclerosis (ICD10-I70.0) and Emphysema (ICD10-J43.9). Electronically Signed   By: Tyler  Litton M.D.   On: 09/22/2020 18:04    Procedures Procedures   Medications Ordered in  ED Medications - No data to display  ED Course  I have reviewed the triage vital signs and the nursing notes.  Pertinent labs & imaging results that were available during my care of the patient were reviewed by me and considered in my medical decision making (see chart for details).    MDM Rules/Calculators/A&P                          80  year old female presenting the emergency department today for evaluation of intermittent episodes of shortness of breath that of been ongoing for several years since her CABG.  She last had an episode a couple of days ago.  They are not exertional.  She denies any associated chest pain.  She denies diaphoresis, nausea, lightheadedness or other concerning symptoms.  Her EKG is baseline here.  Her laboratory values are grossly reassuring. Trop neg, cxr neg. She does have some mild hypokalemia so we will give her some supplemental K-Dur from home.  She did have a murmur on my evaluation so I recommended that she follow-up with cardiology and an ambulatory referral was placed.  She also was noted to have some hypertension here in the emergency department.  She does not take any blood pressure medication at home so I recommended that she follow-up with her PCP about this within the next week or so.  Doubt hypertensive emergency at this time without symptoms.  Feel she is appropriate for discharge home with outpatient follow-up.  She is comfortable with this plan as is her husband at bedside.  She voices understanding of the plan and  reasons to return.  All questions answered.  Patient stable for discharge.  Dr Karle Starch evaluated pt and is in agreement with the plan   Final Clinical Impression(s) / ED Diagnoses Final diagnoses:  Shortness of breath  Hypokalemia    Rx / DC Orders ED Discharge Orders          Ordered    potassium chloride (KLOR-CON) 10 MEQ tablet  Daily        09/22/20 1831    Ambulatory referral to Cardiology        09/22/20 1831              Bishop Dublin 09/22/20 1832    Truddie Hidden, MD 09/22/20 2325

## 2020-12-25 ENCOUNTER — Encounter: Payer: Self-pay | Admitting: Cardiovascular Disease

## 2020-12-25 ENCOUNTER — Ambulatory Visit (INDEPENDENT_AMBULATORY_CARE_PROVIDER_SITE_OTHER): Payer: Medicare Other | Admitting: Cardiovascular Disease

## 2020-12-25 ENCOUNTER — Other Ambulatory Visit: Payer: Self-pay

## 2020-12-25 VITALS — BP 170/106 | HR 69 | Ht 61.5 in | Wt 102.4 lb

## 2020-12-25 DIAGNOSIS — E7800 Pure hypercholesterolemia, unspecified: Secondary | ICD-10-CM

## 2020-12-25 DIAGNOSIS — I10 Essential (primary) hypertension: Secondary | ICD-10-CM | POA: Diagnosis not present

## 2020-12-25 DIAGNOSIS — I2510 Atherosclerotic heart disease of native coronary artery without angina pectoris: Secondary | ICD-10-CM

## 2020-12-25 NOTE — Patient Instructions (Signed)
Medication Instructions:  No changes *If you need a refill on your cardiac medications before your next appointment, please call your pharmacy*   Lab Work: Today: bmet, lipids, liver If you have labs (blood work) drawn today and your tests are completely normal, you will receive your results only by: [place] (if you have MyChart) OR A paper copy in the mail If you have any lab test that is abnormal or we need to change your treatment, we will call you to review the results.   Testing/Procedures: none   Follow-Up: At [facility], you and your health needs are our priority.  As part of our continuing mission to provide you with exceptional heart care, we have created designated Provider Care Teams.  These Care Teams include your primary Cardiologist (physician) and Advanced Practice Providers (APPs -  Physician Assistants and Nurse Practitioners) who all work together to provide you with the care you need, when you need it.  We recommend signing up for the patient portal called "MyChart".  Sign up information is provided on this After Visit Summary.  MyChart is used to connect with patients for Virtual Visits (Telemedicine).  Patients are able to view lab/test results, encounter notes, upcoming appointments, etc.  Non-urgent messages can be sent to your provider as well.   To learn more about what you can do with MyChart, go to [email or web address].    Your next appointment:   6 month(s)  The format for your next appointment:   In Person  Provider:   Lauree Chandler, MD     Other Instructions

## 2020-12-25 NOTE — Progress Notes (Signed)
Chief Complaint  Patient presents with   Follow-up    CAD    History of Present Illness: 80 yo female with history of CAD s/p CABG, HTN, HLD, COPD. Small cell lung cancer, anxiety and Barrett's esophagus who is here today for cardiac follow up. Cardiac cath in 2013 showed a moderate 70% stenosis in the first obtuse marginal branch and a 90% stenosis in the mid LAD. I treated the LAD with overlapping drug eluting stents in 2013 and treated the obtuse marginal branch with a drug eluting stent in February 2014. Cardiac cath 12/14/12 with patent stents, moderate disease. IVUS of left main and proximal LAD confirmed mild plaque disease. Repeat cath April 2016 and June 2017 with moderate non-obstructive CAD, normal filling pressures. I saw her in the office August 2019 and her chest pain had worsened. Cardiac cath August 2019 with progression of disease in her distal left main involving the ostial LAD, ostial Circumflex and ostial intermediate branch. She underwent 4V CABG on 09/24/17. (LIMA to LAD, SVG to intermediate, SVG to OM, SVG to RCA). Following her surgery she had subcutaneous emphysema with no pneumothorax. Echo 09/16/17 with VOZD=66-44%, grade 1 diastolic dysfunction. No significant valve disease. She was found to have a solitary pulmonary nodule in the left upper lobe at the time of her surgery which turned out to be small cell lung cancer. She underwent left upper lobectomy February 2020.   She is here today for follow up. The patient denies any dyspnea, palpitations, lower extremity edema, orthopnea, PND, dizziness, near syncope or syncope. She has resting chest pressure at times, never with exertion.    Primary Care Physician: Etter Sjogren, FNP  Past Medical History:  Diagnosis Date   AKI (acute kidney injury) ([place]) 05/16/2017   Anxiety    Barrett's esophagus without dysplasia    CAD (coronary artery disease)    a. LHC 10/16/11: dLM 20%, mLAD 90%, pOM1 30%, mOM1 70% (FFR not  hemodynamically significant), prox and mid RCA 30%, EF 65%;  b. PCI 10/16/11: Promus DES x 2 to mLAD   Cancer [facility])    left upper lobe   COPD (chronic obstructive pulmonary disease) ([place])    Depression    Diverticulosis    Essential hypertension    Family history of adverse reaction to anesthesia    mother had "breathing problems"   GERD (gastroesophageal reflux disease)    Hyperlipidemia    Rectocele     Past Surgical History:  Procedure Laterality Date   ABDOMINAL HYSTERECTOMY     CARDIAC CATHETERIZATION     CARDIAC CATHETERIZATION N/A 06/21/2015   Procedure: Right/Left Heart Cath and Coronary Angiography;  Surgeon: [name], MD;  Location: [place] CV LAB;  Service: Cardiovascular;  Laterality: N/A;   CARDIAC CATHETERIZATION  09/16/2017   CATARACT EXTRACTION W/ INTRAOCULAR LENS  IMPLANT, BILATERAL Bilateral    COLONOSCOPY  01/2008   Multiple diverticula in desc and sigm colon   CORONARY ARTERY BYPASS GRAFT N/A 09/24/2017   Procedure: CORONARY ARTERY BYPASS GRAFTING (CABG) x4 using mammary artery and saphenous vein. LIMA to LAD, SVG to RCA, SVG to RAMUS, SVT to OM. Endoscopic saphenous vein harvest.;  Surgeon: Ivin Poot, MD;  Location: [place];  Service: Open Heart Surgery;  Laterality: N/A;   ESOPHAGOGASTRODUODENOSCOPY  01/2008   Barrett's esophagus, gastritis, no h.pylori, due follow-up 01/2011   ESOPHAGOGASTRODUODENOSCOPY  04/06/2011   Procedure: ESOPHAGOGASTRODUODENOSCOPY (EGD);  Surgeon: Danie Binder, MD;  [facility];  Service:  Endoscopy;  Laterality: N/A;  8:30   FLEXIBLE SIGMOIDOSCOPY  08/11/2010 RECTAL PAIN/PRESSURE   SML IH, TICS   FRACTIONAL FLOW RESERVE WIRE N/A 10/16/2011   Procedure: FRACTIONAL FLOW RESERVE WIRE;  Surgeon: [name], MD;  Location: [facility] CATH LAB;  Service: Cardiovascular;  Laterality: N/A;   IR PERC PLEURAL DRAIN W/INDWELL CATH W/IMG GUIDE  10/01/2017   LEFT HEART CATH AND CORONARY ANGIOGRAPHY N/A 09/16/2017    Procedure: LEFT HEART CATH AND CORONARY ANGIOGRAPHY;  Surgeon: [name], MD;  Location: [place] [facility];  Service: Cardiovascular;  Laterality: N/A;   LEFT HEART CATHETERIZATION WITH CORONARY ANGIOGRAM N/A 03/07/2012   Procedure: LEFT HEART CATHETERIZATION WITH CORONARY ANGIOGRAM;  Surgeon: [name], MD;  Location: [facility] CATH LAB;  Service: Cardiovascular;  Laterality: N/A;   LEFT HEART CATHETERIZATION WITH CORONARY ANGIOGRAM N/A 12/14/2012   Procedure: LEFT HEART CATHETERIZATION WITH CORONARY ANGIOGRAM;  Surgeon: [name], MD;  Location: [facility] CATH LAB;  Service: Cardiovascular;  Laterality: N/A;   LEFT HEART CATHETERIZATION WITH CORONARY ANGIOGRAM N/A 05/07/2014   Procedure: LEFT HEART CATHETERIZATION WITH CORONARY ANGIOGRAM;  Surgeon: [name], MD;  Location: [facility] CATH LAB;  Service: Cardiovascular;  Laterality: N/A;   PARTIAL HYSTERECTOMY  1980s   TEE WITHOUT CARDIOVERSION N/A 09/24/2017   Procedure: TRANSESOPHAGEAL ECHOCARDIOGRAM (TEE);  Surgeon: Prescott Gum, Collier Salina, MD;  Location: [place];  Service: Open Heart Surgery;  Laterality: N/A;   VIDEO ASSISTED THORACOSCOPY (VATS)/ LOBECTOMY Left 03/08/2018   Procedure: VIDEO ASSISTED THORACOSCOPY (VATS)/ LOBECTOMY;  Surgeon: Prescott Gum, Collier Salina, MD;  Location: [place];  Service: Thoracic;  Laterality: Left;    Current Outpatient Medications  Medication Sig Dispense Refill   acetaminophen (TYLENOL) 325 MG tablet Take 2 tablets (650 mg total) by mouth every 6 (six) hours as needed for mild pain (or Fever >/= 101). (Patient taking differently: Take 325 mg by mouth every 6 (six) hours as needed for mild pain (or Fever >/= 101).)     ALPRAZolam (XANAX) 0.5 MG tablet Take 0.5 mg by mouth 3 (three) times daily.     atorvastatin (LIPITOR) 10 MG tablet Take 10 mg by mouth daily.     Carboxymethylcellul-Glycerin (LUBRICATING EYE DROPS OP) Place 1 drop into both eyes daily as needed (dry eyes).     memantine (NAMENDA) 10  MG tablet Take 10 mg by mouth 2 (two) times daily.     metoprolol tartrate (LOPRESSOR) 25 MG tablet Take 0.5 tablets (12.5 mg total) by mouth 2 (two) times daily. 60 tablet 3   nitroGLYCERIN (NITROSTAT) 0.4 MG SL tablet Place 1 tablet (0.4 mg total) under the tongue every 5 (five) minutes as needed for chest pain. 25 tablet 3   pantoprazole (PROTONIX) 20 MG tablet Take 20 mg by mouth daily.     potassium chloride (KLOR-CON M) 10 MEQ tablet Take 10 mEq by mouth daily.     venlafaxine XR (EFFEXOR-XR) 37.5 MG 24 hr capsule Take 37.5 mg by mouth at bedtime.     zolpidem (AMBIEN) 10 MG tablet Take 10 mg by mouth at bedtime as needed for sleep.     ALPRAZolam (XANAX) 0.25 MG tablet Take 0.25 mg by mouth 2 (two) times daily as needed for anxiety.  (Patient not taking: Reported on 12/25/2020)     atorvastatin (LIPITOR) 20 MG tablet Take 1 tablet (20 mg total) by mouth daily. (Patient not taking: Reported on 12/25/2020) 90 tablet 3   lisinopril (ZESTRIL) 10 MG tablet Take 1 tablet (  10 mg total) by mouth daily. (Patient not taking: Reported on 12/25/2020) 90 tablet 3   potassium chloride (KLOR-CON) 10 MEQ tablet Take 1 tablet (10 mEq total) by mouth daily for 5 days. (Patient not taking: Reported on 12/25/2020) 5 tablet 0   No current facility-administered medications for this visit.   Facility-Administered Medications Ordered in Other Visits  Medication Dose Route Frequency Provider Last Rate Last Admin   midazolam (VERSED) 5 MG/5ML injection    Anesthesia Intra-op Dongell, Tyrone Nine, CRNA   2 mg at 02/07/18 0715    Allergies  Allergen Reactions   Gadolinium Derivatives Hives and Itching    Pt began having hives and itching about 5-7 minutes after contrast administered. Dr Weber Cooks evaluated pt and had Katie RN administer 50 mg Benedryl by IV. Dr Weber Cooks cleared pt to go home.   Adhesive [Tape] Other (See Comments)    Turns skin red   Sulfa Antibiotics Other (See Comments)    Childhood allergy     Social History   Socioeconomic History   Marital status: Married    Spouse name: Not on file   Number of children: 1   Years of education: Not on file   Highest education level: Not on file  Occupational History   Occupation: Retired Child psychotherapist    Comment: retired    Fish farm manager: RETIRED  Tobacco Use   Smoking status: Former    Packs/day: 1.00    Years: 53.00    Pack years: 53.00    Types: Cigarettes    Quit date: 03/20/2007    Years since quitting: 13.7   Smokeless tobacco: Never  Vaping Use   Vaping Use: Never used  Substance and Sexual Activity   Alcohol use: No   Drug use: No   Sexual activity: Yes  Other Topics Concern   Not on file  Social History Narrative   Not on file   Social Determinants of Health   Financial Resource Strain: Not on file  Food Insecurity: Not on file  Transportation Needs: Not on file  Physical Activity: Not on file  Stress: Not on file  Social Connections: Not on file  Intimate Partner Violence: Not on file    Family History  Problem Relation Age of Onset   Coronary artery disease Father 25   Heart attack Father 53   GI problems Mother        Perforated colon    Coronary artery disease Paternal Aunt    Coronary artery disease Paternal Uncle    Colon cancer Neg Hx    Gastric cancer Neg Hx    Esophageal cancer Neg Hx     Review of Systems:  As stated in the HPI and otherwise negative.   BP (!) 170/106   Pulse 69   Ht 5' 1.5" (1.562 m)   Wt 102 lb 6.4 oz (46.4 kg)   SpO2 96%   BMI 19.03 kg/m   Physical Examination:  General: Well developed, well nourished, NAD  HEENT: OP clear, mucus membranes moist  SKIN: warm, dry. No rashes. Neuro: No focal deficits  Musculoskeletal: Muscle strength 5/5 all ext  Psychiatric: Mood and affect normal  Neck: No JVD, no carotid bruits, no thyromegaly, no lymphadenopathy.  Lungs:Clear bilaterally, no wheezes, rhonci, crackles Cardiovascular: Regular rate and rhythm. No  murmurs, gallops or rubs. Abdomen:Soft. Bowel sounds present. Non-tender.  Extremities: No lower extremity edema. Pulses are 2 + in the bilateral DP/PT.  Echo 09/16/17: Left ventricle: The cavity size was normal.  Systolic function was   vigorous. The estimated ejection fraction was in the range of 65%   to 70%. Wall motion was normal; there were no regional wall   motion abnormalities. Doppler parameters are consistent with   abnormal left ventricular relaxation (grade 1 diastolic   dysfunction). There was no evidence of elevated ventricular   filling pressure by Doppler parameters. - Aortic valve: There was no regurgitation. - Aortic root: The aortic root was normal in size. - Mitral valve: Calcified annulus. Mildly thickened leaflets .   There was trivial regurgitation. - Right ventricle: The cavity size was normal. Wall thickness was   normal. Systolic function was normal. - Tricuspid valve: There was mild regurgitation. - Pulmonary arteries: Systolic pressure was within the normal   range. - Inferior vena cava: The vessel was normal in size. - Pericardium, extracardiac: There was no pericardial effusion.  Cardiac cath 09/16/17 1. Severe distal left main artery stenosis with hazy appearance involving the ostia of the LAD, intermediate and Circumflex arteries.  2. Severe ostial LAD stenosis. (Best seen in the LAO 43/CAU 18 view). Patent mid LAD stents.  3. Severe ostial intermediate branch stenosis 4. Severe ostial Circumflex stenosis (best seen in the LAO 49/CAU 8 view). Patent obtuse marginal stent 5. Moderately severe stenosis in the mid RCA 6. Normal LV systolic function   Recommendations: Her distal left main is hazy with severe stenosis affecting the LAD, Circumflex and large intermediate branch. She also has a moderately severe RCA stenosis. Her anatomy is not favorable for PCI/stenting. I will admit her to telemetry given her recent unstable symptoms. Will consult CT surgery  to discuss CABG. She is a good candidate for CABG with normal renal function and normal LV systolic function. She has been on Plavix so this will be held today to allow for washout prior to CABG.   EKG:  EKG is  ordered today. The ekg ordered today demonstrates  Sinus, Non-specific T wave abnormality  Recent Labs: 09/22/2020: BUN 7; Creatinine, Ser 0.82; Hemoglobin 12.7; Platelets 171; Potassium 3.1; Sodium 138   Lipid Panel Followed in primary care  Wt Readings from Last 3 Encounters:  12/25/20 102 lb 6.4 oz (46.4 kg)  09/22/20 95 lb (43.1 kg)  03/08/20 110 lb (49.9 kg)     Other studies Reviewed: Additional studies/ records that were reviewed today include: . Review of the above records demonstrates:    Assessment and Plan:   1. CAD s/p CABG with stable angina: She underwent 4V CABG September 2019. Doing well overall. She has no chest pain suggestive of angina. Her chest pressure is only at rest. Will continue ASA, statin and beta blocker.        2. HTN: BP is elevated today. She will buy a blood pressure cuff and check at home. No changes in therapy. BMET today  3. HLD: Lipids followed in primary care but she has no primary care provider currently. Will check lipids and LFTs now. Continue statin.    Current medicines are reviewed at length with the patient today.  The patient does not have concerns regarding medicines.  The following changes have been made:  no change  Labs/ tests ordered today include:   Orders Placed This Encounter  Procedures   Basic metabolic panel   Hepatic function panel   Lipid panel   EKG 12-Lead    Disposition:   F/U with me in 6 months.   Signed, Lauree Chandler, MD 12/25/2020 12:24 PM  [facility] [place], [place], [place]  [address] Phone: [phone]; Fax: [phone]

## 2020-12-26 ENCOUNTER — Telehealth: Payer: Self-pay | Admitting: *Deleted

## 2020-12-26 DIAGNOSIS — I2510 Atherosclerotic heart disease of native coronary artery without angina pectoris: Secondary | ICD-10-CM

## 2020-12-26 DIAGNOSIS — E7800 Pure hypercholesterolemia, unspecified: Secondary | ICD-10-CM

## 2020-12-26 LAB — HEPATIC FUNCTION PANEL
ALT: 19 IU/L (ref 0–32)
AST: 22 IU/L (ref 0–40)
Albumin: 4.6 g/dL (ref 3.7–4.7)
Alkaline Phosphatase: 98 IU/L (ref 44–121)
Bilirubin Total: 0.8 mg/dL (ref 0.0–1.2)
Bilirubin, Direct: 0.17 mg/dL (ref 0.00–0.40)
Total Protein: 7.3 g/dL (ref 6.0–8.5)

## 2020-12-26 LAB — BASIC METABOLIC PANEL
BUN/Creatinine Ratio: 6 — ABNORMAL LOW (ref 12–28)
BUN: 6 mg/dL — ABNORMAL LOW (ref 8–27)
CO2: 26 mmol/L (ref 20–29)
Calcium: 9.7 mg/dL (ref 8.7–10.3)
Chloride: 101 mmol/L (ref 96–106)
Creatinine, Ser: 0.95 mg/dL (ref 0.57–1.00)
Glucose: 90 mg/dL (ref 70–99)
Potassium: 3.5 mmol/L (ref 3.5–5.2)
Sodium: 143 mmol/L (ref 134–144)
eGFR: 61 mL/min/{1.73_m2} (ref >59)

## 2020-12-26 LAB — LIPID PANEL
Chol/HDL Ratio: 4.3 ratio (ref 0.0–4.4)
Cholesterol, Total: 181 mg/dL (ref 100–199)
HDL: 42 mg/dL (ref >39)
LDL Chol Calc (NIH): 116 mg/dL — ABNORMAL HIGH (ref 0–99)
Triglycerides: 127 mg/dL (ref 0–149)
VLDL Cholesterol Cal: 23 mg/dL (ref 5–40)

## 2020-12-26 MED ORDER — ATORVASTATIN CALCIUM 20 MG PO TABS: 20.0000 mg | ORAL_TABLET | Freq: Every day | ORAL | 3 refills | Status: AC

## 2020-12-26 NOTE — Telephone Encounter (Signed)
-----   Message from [name], MD sent at 12/26/2020 10:26 AM EST ----- BMET is ok. LFTs ok. Her LDL is not at goal. I would like to have her increase her Lipitor to 20 mg daily and repeat her lipids and LFTs in 12 weeks. Thanks, chris

## 2020-12-26 NOTE — Telephone Encounter (Signed)
Reviewed labs with patient.  She will increase atorvastatin to 20 mg.  Discussed possible dietary changes that she could make.   Repeat lab appointment made.

## 2021-01-17 ENCOUNTER — Telehealth: Payer: Self-pay | Admitting: Cardiovascular Disease

## 2021-01-17 MED ORDER — NITROGLYCERIN 0.4 MG SL SUBL: 0.4000 mg | SUBLINGUAL_TABLET | SUBLINGUAL | 3 refills | Status: AC | PRN

## 2021-01-17 NOTE — Telephone Encounter (Signed)
Returned call to Pt.  Pt had recent OV with Dr. Angelena Form where she described some chest tightness, but at that time it was happening at rest and not with any type of exertion.  Today Pt states that the chest tightness happens when she is walking. She describes the tightness as upper chest "near my throat".  She was not particularly concerned about it, but thought that Dr. Angelena Form should be aware.  Advised Pt that she should take 1 nitro tab when she feels this tightness to see if it improves.  Advised if it DOES improve after 1 nitro tab she should call our office to alert [place].  She indicates understanding, but she does not have any nitro at home (it is on her med list).  Advised Pt would send to [facility] to be filled.  Also-at last OV Dr. Angelena Form asked Pt to buy a BP cuff to monitor her BP at home.  She does not remember this.  She will get a BP cuff when she picks up her nitro and will begin a BP log.  Pt requests this nurse send this in writing as she has trouble remembering.  Will Advertising account executive.  Dr. Angelena Form consulted and in agreement with above plan.

## 2021-01-17 NOTE — Telephone Encounter (Signed)
Pt c/o of Chest Pain: STAT if CP now or developed within 24 hours  1. Are you having CP right now? no  2. Are you experiencing any other symptoms (ex. SOB, nausea, vomiting, sweating)? no  3. How long have you been experiencing CP? 3days  4. Is your CP continuous or coming and going? Coming and going   5. Have you taken Nitroglycerin? No  Patient states its a tightness right the cross the top of her chest  ?

## 2021-01-30 ENCOUNTER — Telehealth: Payer: Self-pay | Admitting: *Deleted

## 2021-01-30 NOTE — Telephone Encounter (Signed)
Received a note the mail today.  Dr. Angelena Form reviewed.  Pt reporting she has been having chest tightness and would like to come in to be examined by Dr. Angelena Form.  She also writes that she needs a prescription for Xanax.   I called the patient.  She reports the chest tightness has improved a lot.  It comes on at rest doing nothing really.  Does not happen with any exertional activity.  She thinks she took ntg once for the symptoms.  Unsure what makes it better.  She reports that she takes xanax and is still needing a prescription for this.  Adv pt Dr. Angelena Form has not been the prescriber of this in the past and that PCP should be taking care of that for her.  She is not sure who does prescribe it.  She does not have a new appointment in primary care yet.  She is working on getting a new provider.    The patient was last seen in Dec. 2022.  The chest discomfort was discussed at that time. She called at the end of Dec and spoke w a triage nurse.  She called yesterday and apparently scheduled an appointment 02/28/21 for this same chest tightness.  I will have her keep the appointment for now and asked her to call back if her symptoms worsen or change in any way.  She voices understanding.

## 2021-01-31 ENCOUNTER — Other Ambulatory Visit: Payer: Self-pay | Admitting: Thoracic Surgery (Cardiothoracic Vascular Surgery)

## 2021-01-31 DIAGNOSIS — C3412 Malignant neoplasm of upper lobe, left bronchus or lung: Secondary | ICD-10-CM

## 2021-02-24 ENCOUNTER — Ambulatory Visit: Payer: Medicare Other | Admitting: Cardiovascular Disease

## 2021-02-27 NOTE — Progress Notes (Signed)
Chief Complaint  Patient presents with   Follow-up    CAD   History of Present Illness: 81 yo female with history of CAD s/p CABG, HTN, HLD, COPD. Small cell lung cancer, anxiety and Barrett's esophagus who is here today for cardiac follow up. Cardiac cath in 2013 showed a moderate 70% stenosis in the first obtuse marginal branch and a 90% stenosis in the mid LAD. I treated the LAD with overlapping drug eluting stents in 2013 and treated the obtuse marginal branch with a drug eluting stent in February 2014. Cardiac cath 12/14/12 with patent stents, moderate disease. IVUS of left main and proximal LAD confirmed mild plaque disease. Repeat cath April 2016 and June 2017 with moderate non-obstructive CAD, normal filling pressures. I saw her in the office August 2019 and her chest pain had worsened. Cardiac cath August 2019 with progression of disease in her distal left main involving the ostial LAD, ostial Circumflex and ostial intermediate branch. She underwent 4V CABG on 09/24/17. (LIMA to LAD, SVG to intermediate, SVG to OM, SVG to RCA). Following her surgery she had subcutaneous emphysema with no pneumothorax. Echo 09/16/17 with TOIZ=12-45%, grade 1 diastolic dysfunction. No significant valve disease. She was found to have a solitary pulmonary nodule in the left upper lobe at the time of her surgery which turned out to be small cell lung cancer. She underwent left upper lobectomy February 2020.   She is here today for follow up. The patient denies any dyspnea, palpitations, lower extremity edema, orthopnea, PND, dizziness, near syncope or syncope. She is very active. She has had some aching type pain across her lower chest wall. Worsened with movement and deep breaths. Does not occur every day.   Primary Care Physician: Etter Sjogren, FNP  Past Medical History:  Diagnosis Date   AKI (acute kidney injury) ([place]) 05/16/2017   Anxiety    Barrett's esophagus without dysplasia    CAD (coronary artery  disease)    a. LHC 10/16/11: dLM 20%, mLAD 90%, pOM1 30%, mOM1 70% (FFR not hemodynamically significant), prox and mid RCA 30%, EF 65%;  b. PCI 10/16/11: Promus DES x 2 to mLAD   Cancer [facility])    left upper lobe   COPD (chronic obstructive pulmonary disease) ([place])    Depression    Diverticulosis    Essential hypertension    Family history of adverse reaction to anesthesia    mother had "breathing problems"   GERD (gastroesophageal reflux disease)    Hyperlipidemia    Rectocele     Past Surgical History:  Procedure Laterality Date   ABDOMINAL HYSTERECTOMY     CARDIAC CATHETERIZATION     CARDIAC CATHETERIZATION N/A 06/21/2015   Procedure: Right/Left Heart Cath and Coronary Angiography;  Surgeon: [name], MD;  Location: [place] [facility];  Service: Cardiovascular;  Laterality: N/A;   CARDIAC CATHETERIZATION  09/16/2017   CATARACT EXTRACTION W/ INTRAOCULAR LENS  IMPLANT, BILATERAL Bilateral    COLONOSCOPY  01/2008   Multiple diverticula in desc and sigm colon   CORONARY ARTERY BYPASS GRAFT N/A 09/24/2017   Procedure: CORONARY ARTERY BYPASS GRAFTING (CABG) x4 using mammary artery and saphenous vein. LIMA to LAD, SVG to RCA, SVG to RAMUS, SVT to OM. Endoscopic saphenous vein harvest.;  Surgeon: Ivin Poot, MD;  Location: [place];  Service: Open Heart Surgery;  Laterality: N/A;   ESOPHAGOGASTRODUODENOSCOPY  01/2008   Barrett's esophagus, gastritis, no h.pylori, due follow-up 01/2011   ESOPHAGOGASTRODUODENOSCOPY  04/06/2011   Procedure:  ESOPHAGOGASTRODUODENOSCOPY (EGD);  Surgeon: Danie Binder, MD;  [facility];  Service: Endoscopy;  Laterality: N/A;  8:30   FLEXIBLE SIGMOIDOSCOPY  08/11/2010 RECTAL PAIN/PRESSURE   SML IH, TICS   FRACTIONAL FLOW RESERVE WIRE N/A 10/16/2011   Procedure: FRACTIONAL FLOW RESERVE WIRE;  Surgeon: [name], MD;  Location: [facility] CATH LAB;  Service: Cardiovascular;  Laterality: N/A;   IR PERC PLEURAL DRAIN W/INDWELL CATH W/IMG GUIDE   10/01/2017   LEFT HEART CATH AND CORONARY ANGIOGRAPHY N/A 09/16/2017   Procedure: LEFT HEART CATH AND CORONARY ANGIOGRAPHY;  Surgeon: [name], MD;  Location: [place] [facility];  Service: Cardiovascular;  Laterality: N/A;   LEFT HEART CATHETERIZATION WITH CORONARY ANGIOGRAM N/A 03/07/2012   Procedure: LEFT HEART CATHETERIZATION WITH CORONARY ANGIOGRAM;  Surgeon: [name], MD;  Location: [facility] CATH LAB;  Service: Cardiovascular;  Laterality: N/A;   LEFT HEART CATHETERIZATION WITH CORONARY ANGIOGRAM N/A 12/14/2012   Procedure: LEFT HEART CATHETERIZATION WITH CORONARY ANGIOGRAM;  Surgeon: [name], MD;  Location: [facility] CATH LAB;  Service: Cardiovascular;  Laterality: N/A;   LEFT HEART CATHETERIZATION WITH CORONARY ANGIOGRAM N/A 05/07/2014   Procedure: LEFT HEART CATHETERIZATION WITH CORONARY ANGIOGRAM;  Surgeon: [name], MD;  Location: [facility] C[facility] LAB;  Service: Cardiovascular;  Laterality: N/A;   PARTIAL HYSTERECTOMY  1980s   TEE WITHOUT CARDIOVERSION N/A 09/24/2017   Procedure: TRANSESOPHAGEAL ECHOCARDIOGRAM (TEE);  Surgeon: Prescott Gum, Collier Salina, MD;  Location: [place];  Service: Open Heart Surgery;  Laterality: N/A;   VIDEO ASSISTED THORACOSCOPY (VATS)/ LOBECTOMY Left 03/08/2018   Procedure: VIDEO ASSISTED THORACOSCOPY (VATS)/ LOBECTOMY;  Surgeon: Prescott Gum, Collier Salina, MD;  Location: [place];  Service: Thoracic;  Laterality: Left;    Current Outpatient Medications  Medication Sig Dispense Refill   acetaminophen (TYLENOL) 325 MG tablet Take 2 tablets (650 mg total) by mouth every 6 (six) hours as needed for mild pain (or Fever >/= 101).     ALPRAZolam (XANAX) 0.25 MG tablet Take 0.25 mg by mouth 2 (two) times daily as needed for anxiety.     ALPRAZolam (XANAX) 0.5 MG tablet Take 0.5 mg by mouth 3 (three) times daily.     aspirin EC 81 MG tablet Take 81 mg by mouth daily. Swallow whole.     atorvastatin (LIPITOR) 20 MG tablet Take 1 tablet (20 mg total) by mouth  daily. 90 tablet 3   Carboxymethylcellul-Glycerin (LUBRICATING EYE DROPS OP) Place 1 drop into both eyes daily as needed (dry eyes).     lisinopril (ZESTRIL) 10 MG tablet Take 1 tablet (10 mg total) by mouth daily. 90 tablet 3   memantine (NAMENDA) 10 MG tablet Take 10 mg by mouth 2 (two) times daily.     metoprolol tartrate (LOPRESSOR) 25 MG tablet Take 0.5 tablets (12.5 mg total) by mouth 2 (two) times daily. 60 tablet 3   nitroGLYCERIN (NITROSTAT) 0.4 MG SL tablet Place 1 tablet (0.4 mg total) under the tongue every 5 (five) minutes as needed for chest pain. 25 tablet 3   pantoprazole (PROTONIX) 20 MG tablet Take 20 mg by mouth daily.     venlafaxine XR (EFFEXOR-XR) 37.5 MG 24 hr capsule Take 37.5 mg by mouth at bedtime.     zolpidem (AMBIEN) 10 MG tablet Take 10 mg by mouth at bedtime as needed for sleep.     No current facility-administered medications for this visit.   Facility-Administered Medications Ordered in Other Visits  Medication Dose Route Frequency Provider Last Rate  Last Admin   midazolam (VERSED) 5 MG/5ML injection    Anesthesia Intra-op Shirlyn Goltz, CRNA   2 mg at 02/07/18 0715    Allergies  Allergen Reactions   Gadolinium Derivatives Hives and Itching    Pt began having hives and itching about 5-7 minutes after contrast administered. Dr Weber Cooks evaluated pt and had Katie RN administer 50 mg Benedryl by IV. Dr Weber Cooks cleared pt to go home.   Adhesive [Tape] Other (See Comments)    Turns skin red   Sulfa Antibiotics Other (See Comments)    Childhood allergy    Social History   Socioeconomic History   Marital status: Married    Spouse name: Not on file   Number of children: 1   Years of education: Not on file   Highest education level: Not on file  Occupational History   Occupation: Retired Child psychotherapist    Comment: retired    Fish farm manager: RETIRED  Tobacco Use   Smoking status: Former    Packs/day: 1.00    Years: 53.00    Pack years: 53.00     Types: Cigarettes    Quit date: 03/20/2007    Years since quitting: 13.9   Smokeless tobacco: Never  Vaping Use   Vaping Use: Never used  Substance and Sexual Activity   Alcohol use: No   Drug use: No   Sexual activity: Yes  Other Topics Concern   Not on file  Social History Narrative   Not on file   Social Determinants of Health   Financial Resource Strain: Not on file  Food Insecurity: Not on file  Transportation Needs: Not on file  Physical Activity: Not on file  Stress: Not on file  Social Connections: Not on file  Intimate Partner Violence: Not on file    Family History  Problem Relation Age of Onset   Coronary artery disease Father 78   Heart attack Father 9   GI problems Mother        Perforated colon    Coronary artery disease Paternal Aunt    Coronary artery disease Paternal Uncle    Colon cancer Neg Hx    Gastric cancer Neg Hx    Esophageal cancer Neg Hx     Review of Systems:  As stated in the HPI and otherwise negative.   BP 130/68    Pulse 69    Ht 5' 1.58" (1.564 m)    Wt 102 lb 9.6 oz (46.5 kg)    SpO2 97%    BMI 19.02 kg/m   Physical Examination: General: Well developed, well nourished, NAD  HEENT: OP clear, mucus membranes moist  SKIN: warm, dry. No rashes. Neuro: No focal deficits  Musculoskeletal: Muscle strength 5/5 all ext  Psychiatric: Mood and affect normal  Neck: No JVD, no carotid bruits, no thyromegaly, no lymphadenopathy.  Lungs:Clear bilaterally, no wheezes, rhonci, crackles Cardiovascular: Regular rate and rhythm. Systolic murmur.  Abdomen:Soft. Bowel sounds present. Non-tender.  Extremities: No lower extremity edema. Pulses are 2 + in the bilateral DP/PT.  Echo 09/16/17: Left ventricle: The cavity size was normal. Systolic function was   vigorous. The estimated ejection fraction was in the range of 65%   to 70%. Wall motion was normal; there were no regional wall   motion abnormalities. Doppler parameters are consistent with    abnormal left ventricular relaxation (grade 1 diastolic   dysfunction). There was no evidence of elevated ventricular   filling pressure by Doppler parameters. - Aortic valve: There  was no regurgitation. - Aortic root: The aortic root was normal in size. - Mitral valve: Calcified annulus. Mildly thickened leaflets .   There was trivial regurgitation. - Right ventricle: The cavity size was normal. Wall thickness was   normal. Systolic function was normal. - Tricuspid valve: There was mild regurgitation. - Pulmonary arteries: Systolic pressure was within the normal   range. - Inferior vena cava: The vessel was normal in size. - Pericardium, extracardiac: There was no pericardial effusion.  Cardiac cath 09/16/17 1. Severe distal left main artery stenosis with hazy appearance involving the ostia of the LAD, intermediate and Circumflex arteries.  2. Severe ostial LAD stenosis. (Best seen in the LAO 43/CAU 18 view). Patent mid LAD stents.  3. Severe ostial intermediate branch stenosis 4. Severe ostial Circumflex stenosis (best seen in the LAO 49/CAU 8 view). Patent obtuse marginal stent 5. Moderately severe stenosis in the mid RCA 6. Normal LV systolic function   Recommendations: Her distal left main is hazy with severe stenosis affecting the LAD, Circumflex and large intermediate branch. She also has a moderately severe RCA stenosis. Her anatomy is not favorable for PCI/stenting. I will admit her to telemetry given her recent unstable symptoms. Will consult CT surgery to discuss CABG. She is a good candidate for CABG with normal renal function and normal LV systolic function. She has been on Plavix so this will be held today to allow for washout prior to CABG.   EKG:  EKG is ordered today. The ekg ordered today demonstrates  sinus, TWI chronic  Recent Labs: 09/22/2020: Hemoglobin 12.7; Platelets 171 12/25/2020: ALT 19; BUN 6; Creatinine, Ser 0.95; Potassium 3.5; Sodium 143   Lipid Panel Lipid  Panel     Component Value Date/Time   CHOL 181 12/25/2020 1222   TRIG 127 12/25/2020 1222   HDL 42 12/25/2020 1222   CHOLHDL 4.3 12/25/2020 1222   LDLCALC 116 (H) 12/25/2020 1222   LABVLDL 23 12/25/2020 1222     Wt Readings from Last 3 Encounters:  02/28/21 102 lb 9.6 oz (46.5 kg)  12/25/20 102 lb 6.4 oz (46.4 kg)  09/22/20 95 lb (43.1 kg)     Other studies Reviewed: Additional studies/ records that were reviewed today include: . Review of the above records demonstrates:    Assessment and Plan:   1. CAD s/p CABG with stable angina: She underwent 4V CABG September 2019. She is doing well. Her chest pain seems to be worsened with movement and deep breaths. This does not sound like angina. Continue ASA, beta blocker and statin.         2. HTN: BP is controlled. No changes  3. HLD: LDL 116 in December 2022. Lipitor was increased at that time. Repeat lipids and LFTs now. Continue statin   4. Mitral regurgitation: Trivial MR by echo in 2019. Murmur is louder on exam today. Will repeat echo now.   Current medicines are reviewed at length with the patient today.  The patient does not have concerns regarding medicines.  The following changes have been made:  no change  Labs/ tests ordered today include:   Orders Placed This Encounter  Procedures   Hepatic function panel   Lipid panel   EKG 12-Lead   ECHOCARDIOGRAM COMPLETE    Disposition:   F/U with me in 6 months.   Signed, Lauree Chandler, MD 02/28/2021 9:41 AM    [facility] [place], [place], [place]  [address] Phone: [phone]; Fax: [phone]  °

## 2021-02-28 ENCOUNTER — Other Ambulatory Visit: Payer: Self-pay

## 2021-02-28 ENCOUNTER — Ambulatory Visit (INDEPENDENT_AMBULATORY_CARE_PROVIDER_SITE_OTHER): Payer: Medicare Other | Admitting: Cardiovascular Disease

## 2021-02-28 ENCOUNTER — Encounter: Payer: Self-pay | Admitting: Cardiovascular Disease

## 2021-02-28 VITALS — BP 130/68 | HR 69 | Ht 61.58 in | Wt 102.6 lb

## 2021-02-28 DIAGNOSIS — I10 Essential (primary) hypertension: Secondary | ICD-10-CM | POA: Diagnosis not present

## 2021-02-28 DIAGNOSIS — I340 Nonrheumatic mitral (valve) insufficiency: Secondary | ICD-10-CM

## 2021-02-28 DIAGNOSIS — I2510 Atherosclerotic heart disease of native coronary artery without angina pectoris: Secondary | ICD-10-CM

## 2021-02-28 DIAGNOSIS — E7800 Pure hypercholesterolemia, unspecified: Secondary | ICD-10-CM | POA: Diagnosis not present

## 2021-02-28 LAB — LIPID PANEL
Chol/HDL Ratio: 3 ratio (ref 0.0–4.4)
Cholesterol, Total: 131 mg/dL (ref 100–199)
HDL: 43 mg/dL (ref >39)
LDL Chol Calc (NIH): 69 mg/dL (ref 0–99)
Triglycerides: 99 mg/dL (ref 0–149)
VLDL Cholesterol Cal: 19 mg/dL (ref 5–40)

## 2021-02-28 LAB — HEPATIC FUNCTION PANEL
ALT: 10 IU/L (ref 0–32)
AST: 18 IU/L (ref 0–40)
Albumin: 4.1 g/dL (ref 3.7–4.7)
Alkaline Phosphatase: 101 IU/L (ref 44–121)
Bilirubin Total: 0.8 mg/dL (ref 0.0–1.2)
Bilirubin, Direct: 0.18 mg/dL (ref 0.00–0.40)
Total Protein: 6.8 g/dL (ref 6.0–8.5)

## 2021-02-28 NOTE — Patient Instructions (Signed)
Medication Instructions:  No changes *If you need a refill on your cardiac medications before your next appointment, please call your pharmacy*   Lab Work: Today: lipids/liver function  If you have labs (blood work) drawn today and your tests are completely normal, you will receive your results only by: [place] (if you have MyChart) OR A paper copy in the mail If you have any lab test that is abnormal or we need to change your treatment, we will call you to review the results.   Testing/Procedures: Your physician has requested that you have an echocardiogram. Echocardiography is a painless test that uses sound waves to create images of your heart. It provides your doctor with information about the size and shape of your heart and how well your hearts chambers and valves are working. This procedure takes approximately one hour. There are no restrictions for this procedure.   Follow-Up: At [facility], you and your health needs are our priority.  As part of our continuing mission to provide you with exceptional heart care, we have created designated Provider Care Teams.  These Care Teams include your primary Cardiologist (physician) and Advanced Practice Providers (APPs -  Physician Assistants and Nurse Practitioners) who all work together to provide you with the care you need, when you need it.  We recommend signing up for the patient portal called "MyChart".  Sign up information is provided on this After Visit Summary.  MyChart is used to connect with patients for Virtual Visits (Telemedicine).  Patients are able to view lab/test results, encounter notes, upcoming appointments, etc.  Non-urgent messages can be sent to your provider as well.   To learn more about what you can do with MyChart, go to [email or web address].    Your next appointment:   6 month(s)  The format for your next appointment:   In Person  Provider:   Lauree Chandler, MD     Other  Instructions

## 2021-03-05 ENCOUNTER — Encounter: Payer: Self-pay | Admitting: *Deleted

## 2021-03-14 ENCOUNTER — Other Ambulatory Visit: Payer: Self-pay

## 2021-03-14 ENCOUNTER — Ambulatory Visit (INDEPENDENT_AMBULATORY_CARE_PROVIDER_SITE_OTHER): Payer: Medicare Other | Admitting: Thoracic Surgery (Cardiothoracic Vascular Surgery)

## 2021-03-14 ENCOUNTER — Ambulatory Visit
Admission: RE | Admit: 2021-03-14 | Discharge: 2021-03-14 | Disposition: A | Discharge status: Home / Self Care | Payer: Medicare Other | Source: Ambulatory Visit | Attending: Thoracic Surgery (Cardiothoracic Vascular Surgery) | Admitting: Thoracic Surgery (Cardiothoracic Vascular Surgery)

## 2021-03-14 DIAGNOSIS — Z85118 Personal history of other malignant neoplasm of bronchus and lung: Secondary | ICD-10-CM | POA: Diagnosis not present

## 2021-03-14 DIAGNOSIS — C3412 Malignant neoplasm of upper lobe, left bronchus or lung: Secondary | ICD-10-CM

## 2021-03-14 NOTE — Progress Notes (Signed)
°   °  [address]       [place] [address]             [phone]       Patient: Home Provider: Office Consent for Telemedicine visit obtained.  Todays visit was completed via a real-time telehealth (see specific modality noted below). The patient/authorized person provided oral consent at the time of the visit to engage in a telemedicine encounter with the present provider at [facility]. The patient/authorized person was informed of the potential benefits, limitations, and risks of telemedicine. The patient/authorized person expressed understanding that the laws that protect confidentiality also apply to telemedicine. The patient/authorized person acknowledged understanding that telemedicine does not provide emergency services and that he or she would need to call 911 or proceed to the nearest hospital for help if such a need arose.   Total time spent in the clinical discussion 10 minutes.  Telehealth Modality: Phone visit (audio only)  I had a telephone visit with Rhonda Hughes.  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 Darcey Nora in February 2020.  She has no complaints.  I personally reviewed her cross-sectional imaging.  There is no evidence of any new or recurrent disease.  She will follow-up in 1 year with another low-dose CT scan.  [name]

## 2021-03-17 ENCOUNTER — Ambulatory Visit (HOSPITAL_COMMUNITY): Payer: Medicare Other | Attending: Cardiology

## 2021-03-17 ENCOUNTER — Other Ambulatory Visit: Payer: Self-pay

## 2021-03-17 DIAGNOSIS — R011 Cardiac murmur, unspecified: Secondary | ICD-10-CM | POA: Diagnosis not present

## 2021-03-17 DIAGNOSIS — I340 Nonrheumatic mitral (valve) insufficiency: Secondary | ICD-10-CM | POA: Diagnosis present

## 2021-03-17 DIAGNOSIS — E7800 Pure hypercholesterolemia, unspecified: Secondary | ICD-10-CM | POA: Insufficient documentation

## 2021-03-17 DIAGNOSIS — I10 Essential (primary) hypertension: Secondary | ICD-10-CM | POA: Diagnosis present

## 2021-03-17 DIAGNOSIS — I2510 Atherosclerotic heart disease of native coronary artery without angina pectoris: Secondary | ICD-10-CM | POA: Diagnosis not present

## 2021-03-17 LAB — ECHOCARDIOGRAM COMPLETE
Area-P 1/2: 2.29 cm2
S' Lateral: 2.6 cm

## 2021-03-19 ENCOUNTER — Telehealth: Payer: Self-pay | Admitting: Cardiovascular Disease

## 2021-03-19 NOTE — Telephone Encounter (Signed)
Patient returning call for echo results. She says she will be at a dentist appointment and won't be available from 3-4 pm.  ?

## 2021-03-19 NOTE — Telephone Encounter (Signed)
Lm to call back ./cy 

## 2021-03-25 NOTE — Telephone Encounter (Signed)
Left message for patient to call back  

## 2021-03-26 ENCOUNTER — Other Ambulatory Visit: Payer: Medicare Other

## 2021-03-27 ENCOUNTER — Other Ambulatory Visit: Payer: Medicare Other

## 2021-04-04 NOTE — Telephone Encounter (Signed)
[name], MD  ?03/18/2021 12:30 PM EST   ?  ?Her heart is overall strong. The bottom wall of her heart does not move well. This may suggest progression of her CAD. Can we let her know and see how she is feeling? Her chest pain seemed atypical but if she continues to have chest pain, I can see her back and we can talk about repeating her cardiac cath. Rhonda Hughes  ? ? ?The patient's husband has been notified of the result and verbalized understanding.  All questions (if any) were answered. ?Antonieta Iba, RN 04/04/2021 12:29 PM  ?  ?He states that she has not complained of any chest pain recently. Advised that if she does continue to have any to give [place] a call.  ?

## 2021-05-19 ENCOUNTER — Encounter: Payer: Self-pay | Admitting: Cardiovascular Disease

## 2021-05-19 ENCOUNTER — Ambulatory Visit (INDEPENDENT_AMBULATORY_CARE_PROVIDER_SITE_OTHER): Payer: Medicare Other | Admitting: Cardiovascular Disease

## 2021-05-19 VITALS — BP 190/88 | HR 75 | Ht 61.0 in | Wt 99.6 lb

## 2021-05-19 DIAGNOSIS — I2510 Atherosclerotic heart disease of native coronary artery without angina pectoris: Secondary | ICD-10-CM | POA: Diagnosis not present

## 2021-05-19 DIAGNOSIS — I10 Essential (primary) hypertension: Secondary | ICD-10-CM

## 2021-05-19 DIAGNOSIS — E7800 Pure hypercholesterolemia, unspecified: Secondary | ICD-10-CM

## 2021-05-19 DIAGNOSIS — I340 Nonrheumatic mitral (valve) insufficiency: Secondary | ICD-10-CM

## 2021-05-19 MED ORDER — METOPROLOL SUCCINATE ER 25 MG PO TB24: 25.0000 mg | ORAL_TABLET | Freq: Every day | ORAL | 3 refills | Status: DC

## 2021-05-19 NOTE — Progress Notes (Signed)
? ?Chief Complaint  ?Patient presents with  ? Follow-up  ?  CAD  ? ?History of Present Illness: 81 yo female with history of CAD s/p CABG, HTN, HLD, COPD. Small cell lung cancer, anxiety and Barrett's esophagus who is here today for cardiac follow up. Cardiac cath in 2013 showed a moderate 70% stenosis in the first obtuse marginal branch and a 90% stenosis in the mid LAD. I treated the LAD with overlapping drug eluting stents in 2013 and treated the obtuse marginal branch with a drug eluting stent in February 2014. Cardiac cath 12/14/12 with patent stents, moderate disease. IVUS of left main and proximal LAD confirmed mild plaque disease. Repeat cath April 2016 and June 2017 with moderate non-obstructive CAD, normal filling pressures. I saw her in the office August 2019 and her chest pain had worsened. Cardiac cath August 2019 with progression of disease in her distal left main involving the ostial LAD, ostial Circumflex and ostial intermediate branch. She underwent 4V CABG on 09/24/17. (LIMA to LAD, SVG to intermediate, SVG to OM, SVG to RCA). Following her surgery she had subcutaneous emphysema with no pneumothorax. Echo 09/16/17 with TUUE=28-00%, grade 1 diastolic dysfunction. No significant valve disease. She was found to have a solitary pulmonary nodule in the left upper lobe at the time of her surgery which turned out to be small cell lung cancer. She underwent left upper lobectomy February 2020. She was seen in our office in February 2023 and reported some chest burning with deep breaths and with movement of her arms. Her pain was felt to be non-cardiac. Echo February 2023 with LVEF=50-55% with akinesis of the inferolateral wall. No significant valve disease.  ? ?She is here today for follow up. The patient denies any dyspnea, palpitations, lower extremity edema, orthopnea, PND, dizziness, near syncope or syncope. Rare chest pains.  ? ?Primary Care Physician: Etter Sjogren, FNP ? ?Past Medical History:   ?Diagnosis Date  ? AKI (acute kidney injury) ([place]) 05/16/2017  ? Anxiety   ? Barrett's esophagus without dysplasia   ? CAD (coronary artery disease)   ? a. LHC 10/16/11: dLM 20%, mLAD 90%, pOM1 30%, mOM1 70% (FFR not hemodynamically significant), prox and mid RCA 30%, EF 65%;  b. PCI 10/16/11: Promus DES x 2 to mLAD  ? Cancer [facility])   ? left upper lobe  ? COPD (chronic obstructive pulmonary disease) ([place])   ? Depression   ? Diverticulosis   ? Essential hypertension   ? Family history of adverse reaction to anesthesia   ? mother had "breathing problems"  ? GERD (gastroesophageal reflux disease)   ? Hyperlipidemia   ? Rectocele   ? ? ?Past Surgical History:  ?Procedure Laterality Date  ? ABDOMINAL HYSTERECTOMY    ? CARDIAC CATHETERIZATION    ? CARDIAC CATHETERIZATION N/A 06/21/2015  ? Procedure: Right/Left Heart Cath and Coronary Angiography;  Surgeon: [name], MD;  Location: [place] [facility];  Service: Cardiovascular;  Laterality: N/A;  ? CARDIAC CATHETERIZATION  09/16/2017  ? CATARACT EXTRACTION W/ INTRAOCULAR LENS  IMPLANT, BILATERAL Bilateral   ? COLONOSCOPY  01/2008  ? Multiple diverticula in desc and sigm colon  ? CORONARY ARTERY BYPASS GRAFT N/A 09/24/2017  ? Procedure: CORONARY ARTERY BYPASS GRAFTING (CABG) x4 using mammary artery and saphenous vein. LIMA to LAD, SVG to RCA, SVG to RAMUS, SVT to OM. Endoscopic saphenous vein harvest.;  Surgeon: Ivin Poot, MD;  Location: [place];  Service: Open Heart Surgery;  Laterality: N/A;  ?  ESOPHAGOGASTRODUODENOSCOPY  01/2008  ? Barrett's esophagus, gastritis, no h.pylori, due follow-up 01/2011  ? ESOPHAGOGASTRODUODENOSCOPY  04/06/2011  ? Procedure: ESOPHAGOGASTRODUODENOSCOPY (EGD);  Surgeon: Danie Binder, MD;  [facility];  Service: Endoscopy;  Laterality: N/A;  8:30  ? FLEXIBLE SIGMOIDOSCOPY  08/11/2010 RECTAL PAIN/PRESSURE  ? SML IH, TICS  ? FRACTIONAL FLOW RESERVE WIRE N/A 10/16/2011  ? Procedure: FRACTIONAL FLOW RESERVE WIRE;  Surgeon:  [name], MD;  Location: [facility] CATH LAB;  Service: Cardiovascular;  Laterality: N/A;  ? IR PERC PLEURAL DRAIN W/INDWELL CATH W/IMG GUIDE  10/01/2017  ? LEFT HEART CATH AND CORONARY ANGIOGRAPHY N/A 09/16/2017  ? Procedure: LEFT HEART CATH AND CORONARY ANGIOGRAPHY;  Surgeon: [name], MD;  Location: [place] [facility];  Service: Cardiovascular;  Laterality: N/A;  ? LEFT HEART CATHETERIZATION WITH CORONARY ANGIOGRAM N/A 03/07/2012  ? Procedure: LEFT HEART CATHETERIZATION WITH CORONARY ANGIOGRAM;  Surgeon: [name], MD;  Location: [facility] CATH LAB;  Service: Cardiovascular;  Laterality: N/A;  ? LEFT HEART CATHETERIZATION WITH CORONARY ANGIOGRAM N/A 12/14/2012  ? Procedure: LEFT HEART CATHETERIZATION WITH CORONARY ANGIOGRAM;  Surgeon: [name], MD;  Location: [facility] CATH LAB;  Service: Cardiovascular;  Laterality: N/A;  ? LEFT HEART CATHETERIZATION WITH CORONARY ANGIOGRAM N/A 05/07/2014  ? Procedure: LEFT HEART CATHETERIZATION WITH CORONARY ANGIOGRAM;  Surgeon: [name], MD;  Location: [facility] CATH LAB;  Service: Cardiovascular;  Laterality: N/A;  ? PARTIAL HYSTERECTOMY  1980s  ? TEE WITHOUT CARDIOVERSION N/A 09/24/2017  ? Procedure: TRANSESOPHAGEAL ECHOCARDIOGRAM (TEE);  Surgeon: Prescott Gum, Collier Salina, MD;  Location: [place];  Service: Open Heart Surgery;  Laterality: N/A;  ? VIDEO ASSISTED THORACOSCOPY (VATS)/ LOBECTOMY Left 03/08/2018  ? Procedure: VIDEO ASSISTED THORACOSCOPY (VATS)/ LOBECTOMY;  Surgeon: Prescott Gum, Collier Salina, MD;  Location: [place];  Service: Thoracic;  Laterality: Left;  ? ? ?Current Outpatient Medications  ?Medication Sig Dispense Refill  ? acetaminophen (TYLENOL) 325 MG tablet Take 2 tablets (650 mg total) by mouth every 6 (six) hours as needed for mild pain (or Fever >/= 101).    ? ALPRAZolam (XANAX) 0.5 MG tablet Take 0.5 mg by mouth 3 (three) times daily.    ? aspirin EC 81 MG tablet Take 81 mg by mouth daily. Swallow whole.    ? atorvastatin (LIPITOR) 20 MG  tablet Take 1 tablet (20 mg total) by mouth daily. 90 tablet 3  ? Carboxymethylcellul-Glycerin (LUBRICATING EYE DROPS OP) Place 1 drop into both eyes daily as needed (dry eyes).    ? lisinopril (ZESTRIL) 10 MG tablet Take 1 tablet (10 mg total) by mouth daily. 90 tablet 3  ? memantine (NAMENDA) 10 MG tablet Take 10 mg by mouth 2 (two) times daily.    ? metoprolol succinate (TOPROL XL) 25 MG 24 hr tablet Take 1 tablet (25 mg total) by mouth daily. 90 tablet 3  ? nitroGLYCERIN (NITROSTAT) 0.4 MG SL tablet Place 1 tablet (0.4 mg total) under the tongue every 5 (five) minutes as needed for chest pain. 25 tablet 3  ? venlafaxine XR (EFFEXOR-XR) 37.5 MG 24 hr capsule Take 37.5 mg by mouth at bedtime.    ? zolpidem (AMBIEN) 10 MG tablet Take 10 mg by mouth at bedtime as needed for sleep.    ? ALPRAZolam (XANAX) 0.25 MG tablet Take 0.25 mg by mouth 2 (two) times daily as needed for anxiety. (Patient not taking: Reported on 05/19/2021)    ? pantoprazole (PROTONIX) 20 MG tablet Take 20 mg by mouth daily. (  Patient not taking: Reported on 05/19/2021)    ? ?No current facility-administered medications for this visit.  ? ?Facility-Administered Medications Ordered in Other Visits  ?Medication Dose Route Frequency Provider Last Rate Last Admin  ? midazolam (VERSED) 5 MG/5ML injection    Anesthesia Intra-op Shirlyn Goltz, CRNA   2 mg at 02/07/18 0715  ? ? ?Allergies  ?Allergen Reactions  ? Gadolinium Derivatives Hives and Itching  ?  Pt began having hives and itching about 5-7 minutes after contrast administered. Dr Weber Cooks evaluated pt and had Katie RN administer 50 mg Benedryl by IV. Dr Weber Cooks cleared pt to go home.  ? Adhesive [Tape] Other (See Comments)  ?  Turns skin red  ? Sulfa Antibiotics Other (See Comments)  ?  Childhood allergy  ? ? ?Social History  ? ?Socioeconomic History  ? Marital status: Married  ?  Spouse name: Not on file  ? Number of children: 1  ? Years of education: Not on file  ? Highest education level: Not  on file  ?Occupational History  ? Occupation: Retired Child psychotherapist  ?  Comment: retired  ?  Employer: RETIRED  ?Tobacco Use  ? Smoking status: Former  ?  Packs/day: 1.00  ?  Years: 53.00  ?  Pack years: 27.0

## 2021-05-19 NOTE — Patient Instructions (Signed)
Medication Instructions:  ?Your physician has recommended you make the following change in your medication:  ?1.) restart your aspirin 81 mg daily ?2.) start Metoprolol Succinate (Toprol XL) 25 mg - take one tablet daily - pick up at [facility] ? ?*If you need a refill on your cardiac medications before your next appointment, please call your pharmacy* ? ? ?Lab Work: ?none ?If you have labs (blood work) drawn today and your tests are completely normal, you will receive your results only by: ?MyChart Message (if you have MyChart) OR ?A paper copy in the mail ?If you have any lab test that is abnormal or we need to change your treatment, we will call you to review the results. ? ? ?Testing/Procedures: ?none ? ? ?Follow-Up: ?At [facility], you and your health needs are our priority.  As part of our continuing mission to provide you with exceptional heart care, we have created designated Provider Care Teams.  These Care Teams include your primary Cardiologist (physician) and Advanced Practice Providers (APPs -  Physician Assistants and Nurse Practitioners) who all work together to provide you with the care you need, when you need it. ? ?We recommend signing up for the patient portal called "MyChart".  Sign up information is provided on this After Visit Summary.  MyChart is used to connect with patients for Virtual Visits (Telemedicine).  Patients are able to view lab/test results, encounter notes, upcoming appointments, etc.  Non-urgent messages can be sent to your provider as well.   ?To learn more about what you can do with MyChart, go to [email or web address].   ? ?Your next appointment:   ?6 month(s) ? ?The format for your next appointment:   ?In Person ? ?Provider:   ?Lauree Chandler, MD   ? ? ?Important Information About Sugar ? ? ? ? ?  ?

## 2021-07-14 ENCOUNTER — Ambulatory Visit: Payer: Medicare Other | Admitting: Cardiovascular Disease

## 2021-07-16 ENCOUNTER — Ambulatory Visit: Payer: Medicare Other | Admitting: Cardiovascular Disease

## 2021-09-12 ENCOUNTER — Ambulatory Visit: Payer: Medicare Other | Admitting: Cardiovascular Disease

## 2021-11-24 ENCOUNTER — Ambulatory Visit: Payer: Medicare Other | Admitting: Cardiovascular Disease

## 2021-11-24 NOTE — Progress Notes (Deleted)
Chief Complaint  Patient presents with   Follow-up    CAD   History of Present Illness: 81 yo female with history of CAD s/p CABG, HTN, HLD, COPD. Small cell lung cancer, anxiety and Barrett's esophagus who is here today for cardiac follow up. Cardiac cath in 2013 showed a moderate 70% stenosis in the first obtuse marginal branch and a 90% stenosis in the mid LAD. I treated the LAD with overlapping drug eluting stents in 2013 and treated the obtuse marginal branch with a drug eluting stent in February 2014. Cardiac cath 12/14/12 with patent stents, moderate disease. IVUS of left main and proximal LAD confirmed mild plaque disease. Repeat cath April 2016 and June 2017 with moderate non-obstructive CAD, normal filling pressures. I saw her in the office August 2019 and her chest pain had worsened. Cardiac cath August 2019 with progression of disease in her distal left main involving the ostial LAD, ostial Circumflex and ostial intermediate branch. She underwent 4V CABG on 09/24/17. (LIMA to LAD, SVG to intermediate, SVG to OM, SVG to RCA). Following her surgery she had subcutaneous emphysema with no pneumothorax. Echo 09/16/17 with ZTIW=58-09%, grade 1 diastolic dysfunction. No significant valve disease. She was found to have a solitary pulmonary nodule in the left upper lobe at the time of her surgery which turned out to be small cell lung cancer. She underwent left upper lobectomy February 2020. She was seen in our office in February 2023 and reported some chest burning with deep breaths and with movement of her arms. Her pain was felt to be non-cardiac. Echo February 2023 with LVEF=50-55% with akinesis of the inferolateral wall. No significant valve disease. I saw her in the office May 2023 and she was feeling well with rare chest pains.   She is here today for follow up. The patient denies any chest pain, dyspnea, palpitations, lower extremity edema, orthopnea, PND, dizziness, near syncope or syncope.    Primary Care Physician: Etter Sjogren, FNP  Past Medical History:  Diagnosis Date   AKI (acute kidney injury) ([place]) 05/16/2017   Anxiety    Barrett's esophagus without dysplasia    CAD (coronary artery disease)    a. LHC 10/16/11: dLM 20%, mLAD 90%, pOM1 30%, mOM1 70% (FFR not hemodynamically significant), prox and mid RCA 30%, EF 65%;  b. PCI 10/16/11: Promus DES x 2 to mLAD   Cancer [facility])    left upper lobe   COPD (chronic obstructive pulmonary disease) ([place])    Depression    Diverticulosis    Essential hypertension    Family history of adverse reaction to anesthesia    mother had "breathing problems"   GERD (gastroesophageal reflux disease)    Hyperlipidemia    Rectocele     Past Surgical History:  Procedure Laterality Date   ABDOMINAL HYSTERECTOMY     CARDIAC CATHETERIZATION     CARDIAC CATHETERIZATION N/A 06/21/2015   Procedure: Right/Left Heart Cath and Coronary Angiography;  Surgeon: [name], MD;  Location: [place] [facility];  Service: Cardiovascular;  Laterality: N/A;   CARDIAC CATHETERIZATION  09/16/2017   CATARACT EXTRACTION W/ INTRAOCULAR LENS  IMPLANT, BILATERAL Bilateral    COLONOSCOPY  01/2008   Multiple diverticula in desc and sigm colon   CORONARY ARTERY BYPASS GRAFT N/A 09/24/2017   Procedure: CORONARY ARTERY BYPASS GRAFTING (CABG) x4 using mammary artery and saphenous vein. LIMA to LAD, SVG to RCA, SVG to RAMUS, SVT to OM. Endoscopic saphenous vein harvest.;  Surgeon: Prescott Gum,  Collier Salina, MD;  Location: [place];  Service: Open Heart Surgery;  Laterality: N/A;   ESOPHAGOGASTRODUODENOSCOPY  01/2008   Barrett's esophagus, gastritis, no h.pylori, due follow-up 01/2011   ESOPHAGOGASTRODUODENOSCOPY  04/06/2011   Procedure: ESOPHAGOGASTRODUODENOSCOPY (EGD);  Surgeon: Danie Binder, MD;  [facility];  Service: Endoscopy;  Laterality: N/A;  8:30   FLEXIBLE SIGMOIDOSCOPY  08/11/2010 RECTAL PAIN/PRESSURE   SML IH, TICS   FRACTIONAL FLOW RESERVE WIRE  N/A 10/16/2011   Procedure: FRACTIONAL FLOW RESERVE WIRE;  Surgeon: [name], MD;  Location: [facility] CATH LAB;  Service: Cardiovascular;  Laterality: N/A;   IR PERC PLEURAL DRAIN W/INDWELL CATH W/IMG GUIDE  10/01/2017   LEFT HEART CATH AND CORONARY ANGIOGRAPHY N/A 09/16/2017   Procedure: LEFT HEART CATH AND CORONARY ANGIOGRAPHY;  Surgeon: [name], MD;  Location: [place] [facility];  Service: Cardiovascular;  Laterality: N/A;   LEFT HEART CATHETERIZATION WITH CORONARY ANGIOGRAM N/A 03/07/2012   Procedure: LEFT HEART CATHETERIZATION WITH CORONARY ANGIOGRAM;  Surgeon: [name], MD;  Location: [facility] CATH LAB;  Service: Cardiovascular;  Laterality: N/A;   LEFT HEART CATHETERIZATION WITH CORONARY ANGIOGRAM N/A 12/14/2012   Procedure: LEFT HEART CATHETERIZATION WITH CORONARY ANGIOGRAM;  Surgeon: [name], MD;  Location: [facility] CATH LAB;  Service: Cardiovascular;  Laterality: N/A;   LEFT HEART CATHETERIZATION WITH CORONARY ANGIOGRAM N/A 05/07/2014   Procedure: LEFT HEART CATHETERIZATION WITH CORONARY ANGIOGRAM;  Surgeon: [name], MD;  Location: [facility] CATH LAB;  Service: Cardiovascular;  Laterality: N/A;   PARTIAL HYSTERECTOMY  1980s   TEE WITHOUT CARDIOVERSION N/A 09/24/2017   Procedure: TRANSESOPHAGEAL ECHOCARDIOGRAM (TEE);  Surgeon: Prescott Gum, Collier Salina, MD;  Location: [place];  Service: Open Heart Surgery;  Laterality: N/A;   VIDEO ASSISTED THORACOSCOPY (VATS)/ LOBECTOMY Left 03/08/2018   Procedure: VIDEO ASSISTED THORACOSCOPY (VATS)/ LOBECTOMY;  Surgeon: Prescott Gum, Collier Salina, MD;  Location: [place];  Service: Thoracic;  Laterality: Left;    Current Outpatient Medications  Medication Sig Dispense Refill   acetaminophen (TYLENOL) 325 MG tablet Take 2 tablets (650 mg total) by mouth every 6 (six) hours as needed for mild pain (or Fever >/= 101).     ALPRAZolam (XANAX) 0.5 MG tablet Take 0.5 mg by mouth 3 (three) times daily.     aspirin EC 81 MG tablet Take 81 mg  by mouth daily. Swallow whole.     atorvastatin (LIPITOR) 20 MG tablet Take 1 tablet (20 mg total) by mouth daily. 90 tablet 3   Carboxymethylcellul-Glycerin (LUBRICATING EYE DROPS OP) Place 1 drop into both eyes daily as needed (dry eyes).     lisinopril (ZESTRIL) 10 MG tablet Take 1 tablet (10 mg total) by mouth daily. 90 tablet 3   memantine (NAMENDA) 10 MG tablet Take 10 mg by mouth 2 (two) times daily.     metoprolol succinate (TOPROL XL) 25 MG 24 hr tablet Take 1 tablet (25 mg total) by mouth daily. 90 tablet 3   nitroGLYCERIN (NITROSTAT) 0.4 MG SL tablet Place 1 tablet (0.4 mg total) under the tongue every 5 (five) minutes as needed for chest pain. 25 tablet 3   venlafaxine XR (EFFEXOR-XR) 37.5 MG 24 hr capsule Take 37.5 mg by mouth at bedtime.     zolpidem (AMBIEN) 10 MG tablet Take 10 mg by mouth at bedtime as needed for sleep.     ALPRAZolam (XANAX) 0.25 MG tablet Take 0.25 mg by mouth 2 (two) times daily as needed for anxiety. (Patient not taking: Reported on  05/19/2021)     pantoprazole (PROTONIX) 20 MG tablet Take 20 mg by mouth daily. (Patient not taking: Reported on 05/19/2021)     No current facility-administered medications for this visit.   Facility-Administered Medications Ordered in Other Visits  Medication Dose Route Frequency Provider Last Rate Last Admin   midazolam (VERSED) 5 MG/5ML injection    Anesthesia Intra-op Dongell, Tyrone Nine, CRNA   2 mg at 02/07/18 0715    Allergies  Allergen Reactions   Gadolinium Derivatives Hives and Itching    Pt began having hives and itching about 5-7 minutes after contrast administered. Dr Weber Cooks evaluated pt and had Katie RN administer 50 mg Benedryl by IV. Dr Weber Cooks cleared pt to go home.   Adhesive [Tape] Other (See Comments)    Turns skin red   Sulfa Antibiotics Other (See Comments)    Childhood allergy    Social History   Socioeconomic History   Marital status: Married    Spouse name: Not on file   Number of children: 1    Years of education: Not on file   Highest education level: Not on file  Occupational History   Occupation: Retired Child psychotherapist    Comment: retired    Fish farm manager: RETIRED  Tobacco Use   Smoking status: Former    Packs/day: 1.00    Years: 53.00    Pack years: 53.00    Types: Cigarettes    Quit date: 03/20/2007    Years since quitting: 14.1   Smokeless tobacco: Never  Vaping Use   Vaping Use: Never used  Substance and Sexual Activity   Alcohol use: No   Drug use: No   Sexual activity: Yes  Other Topics Concern   Not on file  Social History Narrative   Not on file   Social Determinants of Health   Financial Resource Strain: Not on file  Food Insecurity: Not on file  Transportation Needs: Not on file  Physical Activity: Not on file  Stress: Not on file  Social Connections: Not on file  Intimate Partner Violence: Not on file    Family History  Problem Relation Age of Onset   Coronary artery disease Father 70   Heart attack Father 7   GI problems Mother        Perforated colon    Coronary artery disease Paternal Aunt    Coronary artery disease Paternal Uncle    Colon cancer Neg Hx    Gastric cancer Neg Hx    Esophageal cancer Neg Hx     Review of Systems:  As stated in the HPI and otherwise negative.   BP (!) 190/88   Pulse 75   Ht 5\' 1"  (1.549 m)   Wt 99 lb 9.6 oz (45.2 kg)   SpO2 98%   BMI 18.82 kg/m   Physical Examination: General: Well developed, well nourished, NAD  HEENT: OP clear, mucus membranes moist  SKIN: warm, dry. No rashes. Neuro: No focal deficits  Musculoskeletal: Muscle strength 5/5 all ext  Psychiatric: Mood and affect normal  Neck: No JVD, no carotid bruits, no thyromegaly, no lymphadenopathy.  Lungs:Clear bilaterally, no wheezes, rhonci, crackles Cardiovascular: Regular rate and rhythm. No murmurs, gallops or rubs. Abdomen:Soft. Bowel sounds present. Non-tender.  Extremities: No lower extremity edema. Pulses are 2 + in the  bilateral DP/PT.  Echo February 2023:  1. Akinesis of the mid inferolateral wall with overall preserved LV  function.   2. Left ventricular ejection fraction, by estimation, is 50 to 55%.  The  left ventricle has low normal function. The left ventricle demonstrates  regional wall motion abnormalities (see scoring diagram/findings for  description). There is mild left  ventricular hypertrophy. Left ventricular diastolic parameters are  consistent with Grade I diastolic dysfunction (impaired relaxation).  Elevated left atrial pressure.   3. Right ventricular systolic function is normal. The right ventricular  size is normal.   4. The mitral valve is normal in structure. Trivial mitral valve  regurgitation. No evidence of mitral stenosis.   5. The aortic valve is tricuspid. Aortic valve regurgitation is not  visualized. Aortic valve sclerosis is present, with no evidence of aortic  valve stenosis.   6. The inferior vena cava is normal in size with greater than 50%  respiratory variability, suggesting right atrial pressure of 3 mmHg.   Cardiac cath 09/16/17 1. Severe distal left main artery stenosis with hazy appearance involving the ostia of the LAD, intermediate and Circumflex arteries.  2. Severe ostial LAD stenosis. (Best seen in the LAO 43/CAU 18 view). Patent mid LAD stents.  3. Severe ostial intermediate branch stenosis 4. Severe ostial Circumflex stenosis (best seen in the LAO 49/CAU 8 view). Patent obtuse marginal stent 5. Moderately severe stenosis in the mid RCA 6. Normal LV systolic function   Recommendations: Her distal left main is hazy with severe stenosis affecting the LAD, Circumflex and large intermediate branch. She also has a moderately severe RCA stenosis. Her anatomy is not favorable for PCI/stenting. I will admit her to telemetry given her recent unstable symptoms. Will consult CT surgery to discuss CABG. She is a good candidate for CABG with normal renal function and  normal LV systolic function. She has been on Plavix so this will be held today to allow for washout prior to CABG.   EKG:  EKG is *** ordered today. The ekg ordered today demonstrates   Recent Labs: 09/22/2020: Hemoglobin 12.7; Platelets 171 12/25/2020: BUN 6; Creatinine, Ser 0.95; Potassium 3.5; Sodium 143 02/28/2021: ALT 10   Lipid Panel Lipid Panel     Component Value Date/Time   CHOL 131 02/28/2021 0938   TRIG 99 02/28/2021 0938   HDL 43 02/28/2021 0938   CHOLHDL 3.0 02/28/2021 0938   LDLCALC 69 02/28/2021 0938   LABVLDL 19 02/28/2021 0938     Wt Readings from Last 3 Encounters:  05/19/21 99 lb 9.6 oz (45.2 kg)  02/28/21 102 lb 9.6 oz (46.5 kg)  12/25/20 102 lb 6.4 oz (46.4 kg)    Assessment and Plan:   1. CAD s/p CABG with stable angina: She underwent 4V CABG September 2019. No change in rare chest pains. LV function normal by echo in February 2023.  Continue ASA, statin and Toprol.   2. HTN: BP is controlled. No changes.    3. HLD: LDL 69 in February 2023. Will continue statin.   4. Mitral regurgitation: Mild MR by echo in February 2023.   Labs/ tests ordered today include:   No orders of the defined types were placed in this encounter.  Disposition:   F/U with me in 6 months.   Signed, Lauree Chandler, MD 05/19/2021 1:36 PM    [facility] [place], [place],   [address] Phone: [phone]; Fax: [phone]

## 2022-02-04 ENCOUNTER — Other Ambulatory Visit: Payer: Self-pay | Admitting: Thoracic Surgery (Cardiothoracic Vascular Surgery)

## 2022-02-04 DIAGNOSIS — C3490 Malignant neoplasm of unspecified part of unspecified bronchus or lung: Secondary | ICD-10-CM

## 2022-03-13 ENCOUNTER — Ambulatory Visit
Admission: RE | Admit: 2022-03-13 | Discharge: 2022-03-13 | Disposition: A | Discharge status: Home / Self Care | Payer: Medicare Other | Source: Ambulatory Visit | Attending: Thoracic Surgery (Cardiothoracic Vascular Surgery) | Admitting: Thoracic Surgery (Cardiothoracic Vascular Surgery)

## 2022-03-13 ENCOUNTER — Ambulatory Visit (INDEPENDENT_AMBULATORY_CARE_PROVIDER_SITE_OTHER): Payer: Medicare Other | Admitting: Thoracic Surgery (Cardiothoracic Vascular Surgery)

## 2022-03-13 DIAGNOSIS — C3490 Malignant neoplasm of unspecified part of unspecified bronchus or lung: Secondary | ICD-10-CM

## 2022-03-13 NOTE — Progress Notes (Signed)
     [address]       [place] [address]             [phone]        Patient: Home Provider: Office Consent for Telemedicine visit obtained.   Today's visit was completed via a real-time telehealth (see specific modality noted below). The patient/authorized person provided oral consent at the time of the visit to engage in a telemedicine encounter with the present provider at [facility]. The patient/authorized person was informed of the potential benefits, limitations, and risks of telemedicine. The patient/authorized person expressed understanding that the laws that protect confidentiality also apply to telemedicine. The patient/authorized person acknowledged understanding that telemedicine does not provide emergency services and that he or she would need to call 911 or proceed to the nearest hospital for help if such a need arose.   Total time spent in the clinical discussion 10 minutes.  Telehealth Modality: Phone visit (audio only)  I had a telephone visit with Rhonda Hughes.  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 Darcey Nora in February 2020.  She has no complaints.  I personally reviewed her cross-sectional imaging.  There is no evidence of any new or recurrent disease.  She will follow-up in 1 year with another low-dose CT scan.   [name]

## 2022-04-30 ENCOUNTER — Other Ambulatory Visit: Payer: Self-pay | Admitting: Cardiovascular Disease

## 2023-01-27 ENCOUNTER — Other Ambulatory Visit: Payer: Self-pay | Admitting: Thoracic Surgery (Cardiothoracic Vascular Surgery)

## 2023-01-27 DIAGNOSIS — Z85118 Personal history of other malignant neoplasm of bronchus and lung: Secondary | ICD-10-CM

## 2023-03-19 ENCOUNTER — Telehealth: Payer: Medicare Other | Admitting: Thoracic Surgery (Cardiothoracic Vascular Surgery)

## 2023-03-19 ENCOUNTER — Ambulatory Visit: Payer: Medicare Other
